# Patient Record
Sex: Male | Born: 1935 | Race: White | Hispanic: No | Marital: Married | Smoking: Former smoker
Health system: Southern US, Community
[De-identification: ages and names within clinical notes are randomized; demographics above are authoritative.]

## PROBLEM LIST (undated history)

## (undated) DIAGNOSIS — T4145XA Adverse effect of unspecified anesthetic, initial encounter: Secondary | ICD-10-CM

## (undated) DIAGNOSIS — H9190 Unspecified hearing loss, unspecified ear: Secondary | ICD-10-CM

## (undated) DIAGNOSIS — Z9289 Personal history of other medical treatment: Secondary | ICD-10-CM

## (undated) DIAGNOSIS — I209 Angina pectoris, unspecified: Secondary | ICD-10-CM

## (undated) DIAGNOSIS — E7800 Pure hypercholesterolemia, unspecified: Secondary | ICD-10-CM

## (undated) DIAGNOSIS — G8929 Other chronic pain: Secondary | ICD-10-CM

## (undated) DIAGNOSIS — I2510 Atherosclerotic heart disease of native coronary artery without angina pectoris: Secondary | ICD-10-CM

## (undated) DIAGNOSIS — M545 Low back pain, unspecified: Secondary | ICD-10-CM

## (undated) DIAGNOSIS — T8859XA Other complications of anesthesia, initial encounter: Secondary | ICD-10-CM

## (undated) DIAGNOSIS — I219 Acute myocardial infarction, unspecified: Secondary | ICD-10-CM

## (undated) DIAGNOSIS — E119 Type 2 diabetes mellitus without complications: Secondary | ICD-10-CM

## (undated) DIAGNOSIS — M1990 Unspecified osteoarthritis, unspecified site: Secondary | ICD-10-CM

## (undated) DIAGNOSIS — Z87442 Personal history of urinary calculi: Secondary | ICD-10-CM

## (undated) DIAGNOSIS — I639 Cerebral infarction, unspecified: Secondary | ICD-10-CM

## (undated) DIAGNOSIS — I10 Essential (primary) hypertension: Secondary | ICD-10-CM

## (undated) HISTORY — PX: NASAL SINUS SURGERY: SHX719

## (undated) HISTORY — PX: TYMPANOSTOMY TUBE PLACEMENT: SHX32

## (undated) HISTORY — PX: NASAL FRACTURE SURGERY: SHX718

## (undated) HISTORY — PX: CATARACT EXTRACTION W/ INTRAOCULAR LENS  IMPLANT, BILATERAL: SHX1307

## (undated) HISTORY — PX: COLONOSCOPY: SHX174

## (undated) HISTORY — PX: CYSTOSCOPY W/ STONE MANIPULATION: SHX1427

## (undated) HISTORY — PX: BACK SURGERY: SHX140

## (undated) HISTORY — PX: JOINT REPLACEMENT: SHX530

## (undated) HISTORY — PX: CORONARY ANGIOPLASTY WITH STENT PLACEMENT: SHX49

## (undated) HISTORY — PX: SHOULDER OPEN ROTATOR CUFF REPAIR: SHX2407

## (undated) HISTORY — PX: CARDIAC CATHETERIZATION: SHX172

---

## 1999-07-28 ENCOUNTER — Ambulatory Visit (HOSPITAL_COMMUNITY): Admission: RE | Admit: 1999-07-28 | Discharge: 1999-07-29 | Payer: Self-pay | Admitting: Cardiology

## 1999-08-29 DIAGNOSIS — Z9289 Personal history of other medical treatment: Secondary | ICD-10-CM

## 1999-08-29 DIAGNOSIS — I219 Acute myocardial infarction, unspecified: Secondary | ICD-10-CM

## 1999-08-29 HISTORY — DX: Acute myocardial infarction, unspecified: I21.9

## 1999-08-29 HISTORY — DX: Personal history of other medical treatment: Z92.89

## 1999-08-29 HISTORY — PX: CORONARY ARTERY BYPASS GRAFT: SHX141

## 1999-11-15 ENCOUNTER — Ambulatory Visit (HOSPITAL_COMMUNITY): Admission: RE | Admit: 1999-11-15 | Discharge: 1999-11-16 | Payer: Self-pay | Admitting: Cardiology

## 2000-01-08 ENCOUNTER — Inpatient Hospital Stay (HOSPITAL_COMMUNITY): Admission: EM | Admit: 2000-01-08 | Discharge: 2000-01-20 | Payer: Self-pay | Admitting: Emergency Medicine

## 2000-01-08 ENCOUNTER — Encounter: Payer: Self-pay | Admitting: Emergency Medicine

## 2000-01-09 ENCOUNTER — Encounter: Payer: Self-pay | Admitting: Surgery

## 2000-01-10 ENCOUNTER — Encounter: Payer: Self-pay | Admitting: Surgery

## 2000-01-11 ENCOUNTER — Encounter: Payer: Self-pay | Admitting: Surgery

## 2000-01-12 ENCOUNTER — Encounter: Payer: Self-pay | Admitting: Surgery

## 2000-01-12 ENCOUNTER — Encounter: Payer: Self-pay | Admitting: Cardiovascular Disease

## 2000-01-13 ENCOUNTER — Encounter: Payer: Self-pay | Admitting: Surgery

## 2000-01-14 ENCOUNTER — Encounter: Payer: Self-pay | Admitting: Surgery

## 2000-01-15 ENCOUNTER — Encounter: Payer: Self-pay | Admitting: Surgery

## 2000-01-16 ENCOUNTER — Encounter: Payer: Self-pay | Admitting: Thoracic Surgery (Cardiothoracic Vascular Surgery)

## 2000-01-17 ENCOUNTER — Encounter: Payer: Self-pay | Admitting: Surgery

## 2008-04-02 ENCOUNTER — Emergency Department (HOSPITAL_COMMUNITY): Admission: EM | Admit: 2008-04-02 | Discharge: 2008-04-03 | Payer: Self-pay | Admitting: Emergency Medicine

## 2008-09-10 ENCOUNTER — Encounter: Admission: RE | Admit: 2008-09-10 | Discharge: 2008-09-10 | Payer: Self-pay | Admitting: Specialist

## 2010-04-11 ENCOUNTER — Encounter: Admission: RE | Admit: 2010-04-11 | Discharge: 2010-04-11 | Payer: Self-pay | Admitting: Specialist

## 2010-04-25 ENCOUNTER — Ambulatory Visit (HOSPITAL_COMMUNITY): Admission: RE | Admit: 2010-04-25 | Discharge: 2010-04-25 | Payer: Self-pay | Admitting: Specialist

## 2010-08-23 ENCOUNTER — Inpatient Hospital Stay (HOSPITAL_COMMUNITY)
Admission: RE | Admit: 2010-08-23 | Discharge: 2010-08-27 | Disposition: A | Discharge status: Still In Hospital | Payer: Self-pay | Source: Home / Self Care | Attending: Specialist | Admitting: Specialist

## 2010-08-28 HISTORY — PX: TOTAL KNEE ARTHROPLASTY: SHX125

## 2010-09-18 ENCOUNTER — Encounter: Payer: Self-pay | Admitting: Specialist

## 2010-10-06 NOTE — Discharge Summary (Signed)
Jeffrey Nelson, Jeffrey Nelson               ACCOUNT NO.:  [id number]  MEDICAL RECORD NO.:  [id number]          PATIENT TYPE:  INP  LOCATION:  5031                         FACILITY:  [facility]  PHYSICIAN:  Kerrin Champagne, M.D.   DATE OF BIRTH:  1934/11/13  DATE OF ADMISSION:  08/23/2010 DATE OF DISCHARGE:  08/27/2010                              DISCHARGE SUMMARY   ADMISSION DIAGNOSES: 1. 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 2. Hypertension. 3. Gastroesophageal reflux disease. 4. Coronary artery disease status post coronary artery bypass graft. 5. History of postop atrial fibrillation. 6. Claustrophobia.  DISCHARGE DIAGNOSES: 1. 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 2. Hypertension. 3. Gastroesophageal reflux disease. 4. Coronary artery disease status post coronary artery bypass graft. 5. History of postop atrial fibrillation. 6. Claustrophobia. 7. Urinary tract infection. 8. Acute blood loss anemia. 9. Chest pain, resolved at discharge, noncardiogenic.  PROCEDURE:  On August 23, 2010, the patient underwent right total knee arthroplasty, computer assisted using DePuy components.  This was performed by Dr. Otelia Sergeant, assisted by Maud Deed [facility] under general anesthesia.  CONSULTATIONS:  [facility].  BRIEF HISTORY:  The patient is a 76 year old male who has had a couple years of progressive knee pain.  He was treated initially with anti- inflammatory medications as well as multiple intra-articular steroid injections as well as viscous supplementation and physical therapy.  The patient had previous right knee arthroscopy.  He got some relief initially, but has progressed to severe pain interfering with his activities of daily living.  He has failed conservative treatment and was admitted for the right total knee arthroplasty.  BRIEF HOSPITAL COURSE:  The patient tolerated the  procedure under general anesthesia without complications.  Postoperatively, he was placed on Coumadin for DVT prophylaxis.  Adjustments in Coumadin dose made according to daily pro-times.  The patient had difficulty with urinary frequency and urgency once his catheter was discontinued. Urinalysis was obtained and eventually, he was noted to have urinary tract infection treated with Augmentin.  He was started on physical therapy for range of motion, both actively and passively.  The CPM was utilized.  The patient was instructed in ambulation and gait training and was weightbearing as tolerated on the operative extremity.  He advanced well with his activity level.  Dressing change was done daily. His wound was healing without drainage or signs of infection.  The patient's Hemovac drain had been discontinued on the first postoperative day.  As the patient had allergy to MORPHINE, Dilaudid was used for pain control and the patient developed severe confusion.  He was then weaned to p.o. Percocet and his pain was controlled well.  The patient had episode of chest pain on August 25, 2010, at approximately 3 o'clock. He had difficulty taking a deep breath.  EKG was without changes from his preoperative EKG.  A consult was called and the patient was seen by the [facility].  V/Q scan was ordered to rule out pulmonary embolism.  Cardiac enzyme levels were drawn and were shown to be within normal  limits.  The patient's symptoms subsided with the use of nitroglycerin, but also with the use of simethicone.  Symptoms did not return throughout the remainder of the hospital stay.  Although, the V/Q scan was ordered, I am unable to find documentation in the chart of the test or the results of the test.  He did use utilize oxygen with the onset of this chest pain and a chest x-ray was performed showing no acute changes.  He was weaned off of the oxygen prior to discharge home without any  difficulties.  On August 26, 2010, the Hospitalist's note indicated resolution of the chest pain, most likely noncardiogenic cause.  He was medically stable and the medical team signed off.  On December 31, he was ambulating in the hallway.  He was afebrile, vital signs were stable.  He was comfortable with oral analgesics.  He was able to be discharged to his home with arrangements made for home health physical therapy, durable medical equipment, and Coumadin management.  PERTINENT LABORATORY VALUES:  Admission CBC with hemoglobin 14.6, hematocrit 42.1.  At discharge, hemoglobin 9.8, hematocrit 29.9.  INR at discharge 1.87.  Chemistry studies on admission were within normal limits and remained so during the hospital stay.  Cardiac enzymes were within normal limits.  The urinalysis on December 29 with 11-20 wbc's per high-power field and 3-6 rbc's per high-power field.  Preoperative screening for Staphylococcus was negative for MRSA and Staphylococcus aureus.  PLAN:  He was discharged to his home, arrangement was made for home health physical therapy.  The patient was instructed to continue ambulating weightbearing as tolerated.  He will change his dressing as needed and keep his incision dry until August 28, 2010, at which time, he may shower.  To follow up with Dr. Otelia Sergeant in 2 weeks.  CPM will be utilized at home as well for range of motion.  He is instructed to have a low-sodium, heart-healthy diet.  MEDICATIONS AT DISCHARGE: 1. Amoxicillin for urinary tract infection 500 mg 1 p.o. b.i.d. 2. Coumadin per pharmacy protocol. 3. Robaxin 500 mg 1 every 6-8 hours as needed for spasm. 4. Percocet 5/325 one-two every 4-6 hours as needed for pain.  The patient will follow up 2 weeks from surgery.  All questions encouraged and answered. Stable on discharge.     Wende Neighbors, P.A.   ______________________________ Kerrin Champagne, M.D.    SMV/MEDQ  D:  09/22/2010  T:   09/23/2010  Job:  [id number]  Electronically Signed by Dorna Mai. on 09/28/2010 11:05:50 AM Electronically Signed by [name] M.D. on 10/06/2010 08:20:25 PM

## 2010-11-07 LAB — URINALYSIS, ROUTINE W REFLEX MICROSCOPIC
Bilirubin Urine: NEGATIVE
Glucose, UA: NEGATIVE mg/dL
Hgb urine dipstick: NEGATIVE
Ketones, ur: NEGATIVE mg/dL
Ketones, ur: NEGATIVE mg/dL
Nitrite: NEGATIVE
Protein, ur: NEGATIVE mg/dL
Specific Gravity, Urine: 1.008 (ref 1.005–1.030)
Urobilinogen, UA: 0.2 mg/dL (ref 0.0–1.0)
pH: 6 (ref 5.0–8.0)

## 2010-11-07 LAB — COMPREHENSIVE METABOLIC PANEL
ALT: 15 U/L (ref 0–53)
ALT: 19 U/L (ref 0–53)
AST: 24 U/L (ref 0–37)
Albumin: 2.9 g/dL — ABNORMAL LOW (ref 3.5–5.2)
Alkaline Phosphatase: 65 U/L (ref 39–117)
Alkaline Phosphatase: 85 U/L (ref 39–117)
CO2: 30 mEq/L (ref 19–32)
CO2: 34 mEq/L — ABNORMAL HIGH (ref 19–32)
Calcium: 9.8 mg/dL (ref 8.4–10.5)
Chloride: 96 mEq/L (ref 96–112)
GFR calc Af Amer: >60 mL/min (ref >60)
GFR calc non Af Amer: >60 mL/min (ref >60)
Glucose, Bld: 139 mg/dL — ABNORMAL HIGH (ref 70–99)
Potassium: 4.1 mEq/L (ref 3.5–5.1)
Potassium: 4.5 mEq/L (ref 3.5–5.1)
Sodium: 136 mEq/L (ref 135–145)
Sodium: 137 mEq/L (ref 135–145)
Total Bilirubin: 1.4 mg/dL — ABNORMAL HIGH (ref 0.3–1.2)

## 2010-11-07 LAB — CBC
HCT: 42.1 % (ref 39.0–52.0)
Hemoglobin: 14.6 g/dL (ref 13.0–17.0)
Hemoglobin: 9.8 g/dL — ABNORMAL LOW (ref 13.0–17.0)
MCHC: 34.7 g/dL (ref 30.0–36.0)
MCV: 94.4 fL (ref 78.0–100.0)
MCV: 96.5 fL (ref 78.0–100.0)
MCV: 97.1 fL (ref 78.0–100.0)
Platelets: 197 10*3/uL (ref 150–400)
Platelets: 199 10*3/uL (ref 150–400)
Platelets: 226 10*3/uL (ref 150–400)
RBC: 3.11 MIL/uL — ABNORMAL LOW (ref 4.22–5.81)
RBC: 3.11 MIL/uL — ABNORMAL LOW (ref 4.22–5.81)
RBC: 3.39 MIL/uL — ABNORMAL LOW (ref 4.22–5.81)
WBC: 14.3 10*3/uL — ABNORMAL HIGH (ref 4.0–10.5)
WBC: 15.6 10*3/uL — ABNORMAL HIGH (ref 4.0–10.5)
WBC: 17.6 10*3/uL — ABNORMAL HIGH (ref 4.0–10.5)

## 2010-11-07 LAB — BASIC METABOLIC PANEL
Chloride: 96 mEq/L (ref 96–112)
Creatinine, Ser: 0.94 mg/dL (ref 0.4–1.5)
GFR calc Af Amer: >60 mL/min (ref >60)
Potassium: 4.8 mEq/L (ref 3.5–5.1)

## 2010-11-07 LAB — ABO/RH: ABO/RH(D): O NEG

## 2010-11-07 LAB — DIFFERENTIAL
Basophils Absolute: 0 10*3/uL (ref 0.0–0.1)
Basophils Absolute: 0.1 10*3/uL (ref 0.0–0.1)
Basophils Relative: 1 % (ref 0–1)
Eosinophils Absolute: 0.1 10*3/uL (ref 0.0–0.7)
Eosinophils Absolute: 0.2 10*3/uL (ref 0.0–0.7)
Eosinophils Relative: 1 % (ref 0–5)
Lymphocytes Relative: 15 % (ref 12–46)
Monocytes Absolute: 2.4 10*3/uL — ABNORMAL HIGH (ref 0.1–1.0)
Neutrophils Relative %: 60 % (ref 43–77)

## 2010-11-07 LAB — CK TOTAL AND CKMB (NOT AT ARMC)
CK, MB: 3.3 ng/mL (ref 0.3–4.0)
Relative Index: 1.3 (ref 0.0–2.5)
Total CK: 256 U/L — ABNORMAL HIGH (ref 7–232)

## 2010-11-07 LAB — PROTIME-INR
INR: 0.9 (ref 0.00–1.49)
INR: 1.72 — ABNORMAL HIGH (ref 0.00–1.49)
Prothrombin Time: 12.4 seconds (ref 11.6–15.2)
Prothrombin Time: 20.3 seconds — ABNORMAL HIGH (ref 11.6–15.2)
Prothrombin Time: 21.7 seconds — ABNORMAL HIGH (ref 11.6–15.2)

## 2010-11-07 LAB — TYPE AND SCREEN: ABO/RH(D): O NEG

## 2010-11-07 LAB — URINE MICROSCOPIC-ADD ON

## 2010-11-07 LAB — CARDIAC PANEL(CRET KIN+CKTOT+MB+TROPI)
CK, MB: 2.4 ng/mL (ref 0.3–4.0)
Total CK: 186 U/L (ref 7–232)

## 2010-11-11 LAB — COMPREHENSIVE METABOLIC PANEL
ALT: 23 U/L (ref 0–53)
AST: 27 U/L (ref 0–37)
CO2: 30 mEq/L (ref 19–32)
Chloride: 103 mEq/L (ref 96–112)
Creatinine, Ser: 0.94 mg/dL (ref 0.4–1.5)
GFR calc Af Amer: >60 mL/min (ref >60)
GFR calc non Af Amer: >60 mL/min (ref >60)
Sodium: 138 mEq/L (ref 135–145)
Total Bilirubin: 1.6 mg/dL — ABNORMAL HIGH (ref 0.3–1.2)

## 2010-11-11 LAB — CBC
HCT: 39 % (ref 39.0–52.0)
Hemoglobin: 13.6 g/dL (ref 13.0–17.0)
MCH: 32.7 pg (ref 26.0–34.0)
RBC: 4.16 MIL/uL — ABNORMAL LOW (ref 4.22–5.81)

## 2010-11-11 LAB — DIFFERENTIAL
Basophils Absolute: 0.1 10*3/uL (ref 0.0–0.1)
Eosinophils Absolute: 0.1 10*3/uL (ref 0.0–0.7)
Eosinophils Relative: 2 % (ref 0–5)

## 2010-11-11 LAB — HEMOGLOBIN A1C: Hgb A1c MFr Bld: 6.8 % — ABNORMAL HIGH (ref <5.7)

## 2011-01-13 NOTE — Op Note (Signed)
[place]  Patient:    Jeffrey Nelson, Jeffrey Nelson                      MRN: [id number] Proc. Date: 01/09/00 Adm. Date:  [id number] Attending:  Virgina Evener CC:         Alleen Borne, M.D., CVTS office             Dr. Jonny Ruiz ______________             Cardiac Cath Lab, [facility]                           Operative Report  PREOPERATIVE DIAGNOSIS:  Three vessel coronary disease, status post failed angioplasty of the right ventricular branch with dissection of the right coronary artery.  POSTOPERATIVE DIAGNOSIS:  Three vessel coronary disease, status post failed angioplasty of the right ventricular branch with dissection of the right coronary artery.  SURGICAL PROCEDURE:  Emergency median sternotomy, extracorporeal circulation, coronary artery bypass graft surgery x 4 using left internal mammary artery graft to left anterior descending coronary artery, with a saphenous vein graft to the second marginal branch of the left circumflex coronary artery, a saphenous vein graft to the posterior descending branch of the right coronary artery, and a saphenous vein graft to the right ventricular branch of the right coronary artery.  SURGEON:  Alleen Borne, M.D.  ASSISTANT:  Sherrie George, P.A.-C.  ANESTHESIA:  General endotracheal.  CLINICAL HISTORY:  This patient is a 76 year old gentleman with a history of coronary artery disease with a history of PTCA and stenting of the large right ventricular branch, as well as, stenting of the proximal right coronary artery.  He has also had PTCA of the LAD and stenting of the left circumflex coronary artery in the past.  He began having recurrent substernal chest pain on Friday, which awoke him from sleep.  He continued to have episodes and came to the emergency room.  He ruled out for myocardial infarction.  He was taken to the catheterization laboratory today and had attempted angioplasty of the large  right ventricular branch.  This was complicated by dissection of the proximal right coronary artery with closure and reopening.  I was called to the catheterization laboratory to review the angiogram and help with the decision about further treatment.  The right ventricular branch was a medium to large size vessel that had a tight ostial stenosis.  The main right coronary artery was a large vessel that was now dissected extending down into the mid to distal portion of the vessel.  The posterior descending and posterolateral branch were small to medium-sized branches.  The left anterior descending coronary artery also had about 60-70% mid vessel stenosis.  The left circumflex had some narrowing just beyond the previously stented area that I would estimate at 50-60% stenosis.  Left ventricular function was well-preserved.  Since the patient was relatively young and otherwise in good medical condition, I felt his best treatment would be with coronary artery bypass graft surgery.  I discussed the operative procedure with he and his wife, including alternatives, benefits and risks, including bleeding, possible blood transfusion, infection, stroke, myocardial infarction, graft failure and death.  They understood and agreed to proceed.  OPERATIVE PROCEDURE:  The patient was taken to the operating room and placed on table in supine position.  After induction of general endotracheal anesthesia, Foley catheter was placed in  bladder using sterile technique. Then, the chest, abdomen and both lower extremities were prepped and draped in usual sterile manner.  The chest was entered through a median sternotomy incision.  The pericardium opened in the midline.  Examination of the heart showed good ventricular contractility.  The ascending aorta had no palpable plaques in it.  Then, the left internal mammary artery was harvested from the chest wall as a pedicle graft.  This was a large caliber vessel with  excellent blood flow through it.  At the same time, a segment of greater saphenous vein was harvested from the left lower leg and this vein was of medium size and good quality.  Then, the patient was heparinized and when an adequate activated clotting time was achieved, the distal ascending aorta was cannulated using a 22-French aortic cannula for arterial inflow.  Venous outflow was achieved using a large 2-stage venous cannula through the right atrial appendage.  An antegrade cardioplegia and vent cannula was inserted in the aortic root.  The patient was placed on cardiopulmonary bypass and distal coronaries identified.  The right ventricular branch was on the surface of the heart and suitable for grafting.  The right coronary artery was diffusely diseased extending down to the takeoff of the posterior descending branch.  The posterior descending branch itself was a small to medium-sized vessel, but graftable.  It was a very fragile, thin-wall vessel that had no disease in it. The posterolateral branch was the same.  The patient had two medium-sized marginal branches and the second one appeared larger and was chosen for grafting.  There was no distal disease in the marginal branches themselves. The LAD was a long, large caliber vessel that had some mid vessel disease, but the distal vessel had no disease in it.  Then the percutaneous wire and guide catheter were withdrawn from the patient. The aorta was cross-clamped and 500 cc of cold blood antegrade cardioplegia was administered in the aortic root with quick arrest of the heart.  Systemic hypothermia to 20 degrees Centigrade and topical hypothermia with iced saline was used.  A temperature probe was placed in the septum and insulating pad in the pericardium.  The first distal anastomosis was performed to the posterior descending coronary artery.  The internal diameter was about 1.6 mm.  The conduit used was a segment of greater  saphenous vein and the anastomosis performed in a end-to-side manner using continuous 7-0 Prolene suture.  Flow was measured  through the graft and was excellent.  The second distal anastomosis was performed to the right ventricular branch. The internal diameter was 1.6 mm.  The conduit used was a second segment of greater saphenous vein and the anastomosis performed in an end-to-side manner using continuous 7-0 Prolene suture.  Flow was measured through the graft and was excellent.  Then another dose of cardioplegia was given down the vein grafts and in the aortic root.  The third distal anastomosis was performed to the second marginal branch.  The internal diameter was 1.75 mm.  The conduit used was a third segment of greater saphenous vein and the anastomosis performed in an end-to-side manner using continuous 7-0 Prolene suture.  Flow was measured through the graft and was excellent.  The fourth distal anastomosis was performed to the mid to distal portion of the left anterior descending coronary artery.  The internal diameter was about 2.5 mm.  The conduit used was the left internal mammary artery and this was brought out through an opening  in the left pericardium anterior to the phrenic nerve.  It was anastomosed to the LAD in an end-to-side manner using continuous 8-0 Prolene suture.  The pedicle was tacked to epicardium with 6-0 Prolene sutures.  The patient was rewarmed to 37 degrees Centigrade.  The clamp was removed from the mammary pedicle.  There was rapid warming of ventricular septum and return of spontaneous ventricular fibrillation.  The cross-clamp was removed with a time of 63 minutes and the patient defibrillated into a sinus rhythm.  A partial occlusion clamp was placed on the aortic root and the three proximal vein graft anastomoses were performed in an end-to-side manner using continuous 6-0 Prolene suture.  The clamp was removed and the vein grafts deaired and  the clamps removed from them.  The proximal and distal anastomoses appeared hemostatic and the lying of the grafts satisfactory.  Graft markers were placed around the proximal anastomoses.  Two temporary right ventricular and right atrial pacing wires were placed and brought out through the skin.  When the patient had rewarmed to 37 degrees Centigrade, he was weaned from cardiopulmonary bypass on low-dose dopamine.  Total bypass time was 110 minutes.  Cardiac function appeared excellent with a cardiac output of 7 L/min.  Protamine was given and the venous and aortic cannulae were removed without difficulty.  The patient was given platelets since he was on ReoPro in the catheterization laboratory and an obvious coagulopathy.  He required one unit of packed red blood cells for a postpump hematocrit of 17 and also received two units of fresh frozen plasma due to coagulopathy.  This resulted in adequate hemostasis.  Three chest tubes were placed with a tube in the posterior pericardium, one in the left pleural space and one in the anterior mediastinum.  The pericardium was reapproximated over the heart.  The sternum was closed with #6 stainless steel wires.  The fascia was closed with a continuous #1 Vicryl suture.  Subcutaneous tissue was closed using continuous 2-0 Vicryl and the skin with 3-0 Vicryl subcuticular closure.  Lower extremity vein harvest site was closed layers in a similar manner.  The sponge, needle and instrument counts were correct according to scrub nurse. Dry sterile dressings were applied over the incisions around the chest tubes, which were hooked to Pleur-evac suction.  The patient remained hemodynamically stable and was transported to the SICU in guarded, but stable condition. DD:  01/09/00 TD:  01/10/00 Job: [id number]

## 2011-01-13 NOTE — Cardiovascular Report (Signed)
[place]  Patient:    Jeffrey Nelson, Jeffrey Nelson                      MRN: [id number] Proc. Date: 01/09/00 Adm. Date:  [id number] Attending:  Virgina Evener CC:         Aram Candela. Aleen Campi, M.D.             Alleen Borne, M.D.             Cardiac Catheterization Laboratory                        Cardiac Catheterization  PROCEDURES: 1. Left heart catheterization. 2. Coronary cineangiography. 3. Left ventricular cineangiography. 4. Attempted angioplasty of the proximal right coronary artery, right    ventricular branch.  INDICATIONS FOR PROCEDURE:  This 76 year old male has a history of coronary artery disease and is status post multiple coronary artery angioplasties in his circumflex, mid LAD and right coronary artery.  His most recent angioplasty was on November 15, 1999, at which time he had angioplasty with stent placement in his proximal right coronary artery and large first right ventricular branch.  The right ventricular branch had a stent placement because of its dissection and instability and unable to keep it open without stenting.  He now returns to the hospital after several episodes of very severe anterior chest pain, which was associated with nausea, sweating and each time relieved after multiple nitroglycerin.  In the hospital his enzymes were negative and his ECG was unchanged.  Because of the past history of very reliable chest pain indicating severe stenosis in his coronary arteries, we scheduled him for repeat catheterization and possible angioplasty.  DESCRIPTION OF PROCEDURE:  After signing an informed consent, the patient was brought to the cardiac catheterization lab where his right groin was prepped and draped in a sterile fashion.  The right groin was then anesthetized locally with 1% lidocaine.  A 6 French introducer sheath was inserted percutaneously into the right femoral artery.  A 6 French #4 Judkins coronary catheters  were used to make injections into the coronary arteries.  A 6 French pigtail catheter was used to measure pressures in the left ventricle and aorta and to make a mid stream injection into the left ventricle.  After noting restenosis in the large right ventricular stented site at its ostium, we discussed this finding with the patient and felt that this was the cause of his most recent admission and severe chest pain, and after discussing this finding with the patient we elected to proceed with an angioplasty procedure of the right ventricular branch.  We first selected a 6 [place] JR4 guide catheter along with a Hi-Torque Floppy guide wire and advanced this system into the ascending aorta.  The tip was engaged in the ostium of the right coronary artery and the guidewire was advanced into the proximal right coronary artery and after moderate difficulty it was advanced into the right ventricular branch through the critical stenotic lesion.  We then selected a 3.0 x 10 mm balloon catheter and after proper preparation this was inserted over the guidewire and advanced into the right coronary artery.  Multiple attempts at passing the balloon inside the lesion were unsuccessful.  We then changed the guide catheter to a hockey-stick guide and again we were able to pass the guidewire but unable to pass a low profile balloon over the wire and into the  lesion.  After multiple attempts and changing the balloon catheter to a 2.0 low profile maverick balloon, we still were unable to pass the guidewire into the lesion in the right ventricular branch.  After discussing our difficulty with the patient and strongly feeling that this was the cause of his chest pain on admission, we then elected to use an Amplatz guide catheter A2, which was advanced over Seldinger wire to the root of the aorta.  We were able to engage the tip in the ostium of the right coronary artery and selected a Hi-Torque Floppy guide wire,  which was attempted to pass into the right ventricular branch.  We had very good backup with the Amplatz catheter, however, it advanced into the right coronary artery against the proximal stent and the tip caused a dissection to then extend throughout the proximal segment and into the middle segment where a prior stent is located.  The right coronary artery was then noted to be totally occluded and without antegrade flow and he developed severe chest pain.  We then removed the Amplatz balloon catheter and reinserted the hockey-stick guide catheter along with a Hi-Torque Floppy guide wire, and after engaging the ostium of the right coronary artery with the hockey-stick guide catheter we were able to then pass the guidewire into the right coronary artery and after several attempts we were able to pass it through the proper channel through the totally occluded and dissected area in the proximal right coronary artery and it was passed into the distal segment freely and without hindrance obtaining the proper channel though the dissection.  We inserted the 3.0 balloon catheter over the guidewire and after moderate severe difficulty we were able to pass this balloon catheter into the proximal segment and did multiple inflations throughout the dissected area and obtained a good reopening.  The dissection persisted but after noting reestablishment of good antegrade flow and marked relief of his pain, we felt that further intervention in this severely dissected right coronary artery which was technically a very difficult instrument and to pass a low profile balloon catheter into this area would make it almost impossible to do stenting in this area.  We called [facility] and Dr. Laneta Simmers came immediately and after showing him the cine and discussing the clinical situation, he agreed that bypass graft surgery would be the best alternative at this point.  We discussed this with the patient and after recommending  to the patient that he go for bypass graft surgery, he agreed, and he was then transferred to the OR  with the wire still across the lesion in the right coronary artery.  Final cine into the right coronary artery showed patency with good antegrade flow in the main right coronary artery and the large right ventricular branch was essentially unchanged from the pre-angioplasty cine.  There was slow antegrade flow.  After suturing the right femoral artery sheath and guide catheter in place, the patient was transported to the operating room for surgery.  HEMODYNAMIC DATA:  Left ventricular pressure 134/0-11, aortic pressure 137/76 with a mean of 99.  Left ventricular ejection fraction was estimated at 60-70%.  CORONARY CINE ANGIOGRAPHY:  Left coronary artery:  The ostium and left main appear normal.  Left anterior descending:  There is diffuse plaque throughout the proximal and middle segment with a focal narrowing of 50-60% in the middle segment.  The prior angioplasty site in the middle segment appears normal without significant restenosis.  The distal LAD appears normal.  There is a focal 70% stenosis in the mid to distal segment.  Circumflex coronary artery:  The proximal circumflex appears normal.  The mid circumflex has diffuse plaque which extends to the large distal bifurcation. There is a stented area in this middle segment which has not had significant restenosis.  However, just distal to the stent there is a segmental narrowing of 60-70% between the stent and the distal bifurcation of obtuse marginal branches.  Right coronary artery:  The ostium appears normal.  The proximal segment has a good appearance status post stenting.  The large right ventricular branch in the stented area now has a 99% ostial lesion and slow antegrade flow.  There is diffuse atherosclerotic plaque throughout the proximal segment with mid right coronary narrowing of 20-30%.  There is a stented area  in the middle segment just before the acute angle which now appears essentially normal without significant restenosis.  The distal segment has moderate irregularities with diffuse plaque and one area of 30-40% stenosis just before the takeoff of the posterior descending.  LEFT VENTRICULAR CINEANGIOGRAM:  The left ventricular chamber size and contractility appears normal.  All segments have very normal contractility and the ejection fraction was estimated at between 60-70%.  The mitral and aortic valves appear normal.  ANGIOPLASTY CINE:  Cine taken during the angioplasty procedure shows proper positioning of the guidewire initially in the right ventricular branch and approximation of the balloon catheter before the lesion, but not across the lesion.  Further cine showed dissection of the right coronary artery during the insertion of the Amplatz catheter with closure of the right coronary artery in the middle segment.  Further cine showed advancement of the guidewire through this totally occluded and dissected area and in the mid stream in the distal right coronary artery.  Further cine showed proper positioning of the 3-0 balloon catheter and balloon inflation in several areas.  Final injections in the right coronary artery showed reopening of the right coronary artery with dissection still in place and marked dissection throughout the proximal segment to the proximal portion of the mid right coronary artery stent.  The dissection did not extend into this stent. There was reestablishment of TIMI-3 antegrade flow.  FINAL DIAGNOSES: 1. Three-vessel coronary artery disease with severe critical restenosis in the    large right ventricular branch. 2. Moderate to severe stenosis, mid left anterior descending and distal    circumflex coronary artery. 3. Dissection of the proximal right coronary artery during attempted    angioplasty with subsequent reopening and stabilization for surgery. 4.  Normal left ventricular function. 5. Normal mitral and aortic valves.  DISPOSITION:  The patient was transferred to the operating room for urgent coronary artery bypass graft surgery, considering the instability of his proximal right coronary artery dissection.  Dr. Laneta Simmers will be performing the surgery.  The patient is fully aware of all circumstances and every thing that has occurred and his wife was fully informed also of the entire procedure and complications. DD:  01/09/00 TD:  01/10/00 Job: [id number]

## 2011-01-13 NOTE — Discharge Summary (Signed)
. [facility]  Patient:    HANCE, CASPERS                      MRN: [id number] Adm. Date:  [id number] Disc. Date: [id number] Attending:  Silvestre Mesi Dictator:   Donzetta Matters, P.A.-C.                           Discharge Summary  DATE OF BIRTH:  Dec 30, 1934  PRINCIPAL DIAGNOSES ON DISCHARGE: 1. Coronary artery disease status post stenting. 2. Elevated lipids.  CONSULTS:  ______ ______ study.  CONDITION ON DISCHARGE:  Stable.  COMPLICATIONS:  None.  HISTORY OF PRESENT ILLNESS:  This is a 76 year old male that is brought in electively for heart catheterization on November 15, 1999.  His initial labs were removed which did show a hemoglobin of 13.6, hematocrit 39.0.  White count was 8500, platelet count 231.  His PTT was 28.9, pro time was 12.5, INR 1.06. Follow-up comprehensive metabolic panel did show a sodium at 132, potassium 4.2, BUN 17, creatinine 0.5.  His glucose was 121 on a nonfasting specimen. Otherwise, all other labs were normal.  He was then scheduled for elective heart catheterization with results showing restenosis in the right coronary artery RV  branch.  He underwent successful PTCA and stenting of the right coronary artery  proximal and the RV branch.  He has a normal left ventricular function.  He has  good appearances of prior angioplasty sites.  He was then kept overnight and monitored and remained stable.  He has been ambulated in the halls.  Monitors remain stable with sinus rhythm.  Vitals this a.m. shows blood pressure at 120/50, pulse 72, respirations 18, pulse oximetry 94% on room air.  Chest is clear. Heart has regular rate and rhythm.  He has good pulses and ambulatory without any difficulty with chest pain.  He is ready for discharge to home with the following medications:  Tenormin 50 mg two tablets daily, coated aspirin 325 daily, Plavix one daily.  He is given samples as well as prescription, vitamin  E 800 units daily, vitamin C daily, omega-3 fish oil 3 g daily, folic acid 1 g daily, and Zocor 40 mg daily.  ACTIVITY:  As tolerated.  DIET:  Low cholesterol.  He is to watch the groin for any signs of bleeding.  SPECIAL INSTRUCTIONS:  He has been discussed with ______ that is heading the ______ study with [facility].  It does sound like he could be a good candidate.  He is to have a lipid panel, nonnfasting, before discharge.  He is o follow up with Dr. Aleen Campi in two weeks. DD:  11/16/99 TD:  11/16/99 Job: [id number]

## 2011-01-13 NOTE — Discharge Summary (Signed)
[facility]  Patient:    Jeffrey Nelson, Jeffrey Nelson                      MRN: [id number] Adm. Date:  [id number] Disc. Date: 01/20/00 Attending:  Cleatrice Burke Dictator:   Jeffrey Nelson, P.A. CC:         Jeffrey Nelson, M.D., [facility], [address]             [address], [place], [place] [address]             Jeffrey Nelson. Jeffrey Nelson, M.D.                  Referring Physician Discharge [place]  DATE OF BIRTH:  Aug 25, 2035  ADMISSION DIAGNOSES: 1. Unstable angina, rule out myocardial infarction with a history of prior    percutaneous transluminal coronary angioplasty and stent placement, the    last being right coronary artery on November 15, 1999. 2. Hypertension. 3. Hyperlipidemia.  DISCHARGE DIAGNOSES: 1. Severe three-vessel coronary artery disease with acute dissection of the    right coronary artery. 2. Hypertension. 3. Hyperlipidemia. 4. Postoperative glucose intolerance. 5. Gastroesophageal reflux disease/history of hiatal hernia. 6. Postoperative anemia. 7. Postoperative atrial fibrillation.  PROCEDURES: 1. Cardiac catheterization on Jan 09, 2000.  Findings included a 30-40% left    anterior descending, 20-30% stenosis of the circumflex, 80-90% right    coronary artery stenosis with acute stenosis, ejection fraction estimated    approximately 60% on Jan 09, 2000, by Jeffrey Nelson. Tysinger, M.D. 2. Emergent coronary artery bypass grafting x 4, left internal mammary artery    to left anterior descending, saphenous vein graft to posterior descending,    saphenous vein graft to diagonal, and saphenous vein graft to acute    marginal, on Jan 09, 2000, by Alleen Nelson, M.D.  HISTORY OF PRESENT ILLNESS:  The patient is a 76 year old white male, a medical patient of Jeffrey Nelson, M.D.  He has a primary care physician in [place], [place], Jeffrey Nelson, M.D.  His cardiologist is Jeffrey Nelson. Jeffrey Nelson, M.D.  The patient has a history of  atherosclerotic cardiovascular disease.  His last catheterization was on November 15, 1999, at which time he underwent PTCA and stenting of the right coronary artery.  This was felt to be successful.  Additional findings included diffuse 30-40% stenosis of the circumflex, 20-30% and 40% stenoses of the LAD, and 80-90% stenosis of the RCA.  He has been stable up until yesterday when he developed recurrent chest pain.  This started around 2 p.m. and he took a nitroglycerin.  He developed relief initially.  He called Dr. Aleen Nelson and was brought to the emergency room with progressive worsening of his angina.  PAST MEDICAL HISTORY:  Nephrolithiasis.  Multiple cardiac catheterizations for his cardiac disease.  Hyperlipidemia.  Hypertension.  Remote history of peptic ulcer disease.  MEDICATIONS ON ADMISSION: 1. Zocor 40 mg q.h.s. 2. Atenolol 50 mg two q.d. 3. Ecotrin 325 mg q.d. 4. Folic acid one q.d. 5. Omega 3 fish oil. 6. Vitamin C. 7. Vitamin D. 8. Sublingual nitroglycerin.  ALLERGIES:  None known.  For further history and physical, please see the dictated note from Dr. Star Age office.  HOSPITAL COURSE:  The patient was admitted.  He was stabilized with IV heparin.  He was subsequently taken back to the cardiac catheterization lab in the a.m. on Jan 09, 2000.  During the interim, CPK-MBs were  negative. Catheterization results showed three-vessel coronary artery disease with severe right stenosis of the RV branch within the stent.  The other angioplasty sites appeared to look good.  LV function was normal.  During the procedure, he developed a dissection of the right coronary artery.  He was seen in consultation by Alleen Nelson, M.D., and it was his opinion along with Dr. Aleen Nelson that the patient should undergo emergent coronary artery bypass grafting.  He was subsequently taken to the operating room and underwent coronary artery bypass grafting x 4 with LIMA to the LAD,  saphenous vein graft to the posterior descending, saphenous vein graft to diagonal, and saphenous vein graft to a large RV branch of the acute marginal. The patient tolerated the procedure well and returned to the intensive care unit in satisfactory condition.  He remained stable overnight.  On the first postoperative morning, the patient was hemodynamically stable.  His MTs were removed.  Diuresis was initiated.  He was followed throughout his hospital course by Dr. Aleen Nelson.  On the second postoperative day, the patient continued to do well status post emergent CABG.  Postoperative renal dysfunction was present with creatinine going up to 2.8, although the patient continued to have an adequate urine output.  The patient was maintained on renal dose dopamine.  The patient also had postoperative anemia with a hemoglobin down to 7.9 and a hematocrit of 22.4.  The patient was transfused one unit of packed cells and was mobilized further within the ICU.  By Jan 12, 2000, the third postoperative day, the hemoglobin was 8.2, the hematocrit was 22, and the creatinine was stable at 2.3.  The chest x-ray showed some haziness at the left hemithorax which was thought to be atelectasis.  The patient also developed postoperative atrial fibrillation and was digitalized. The patient received a unit of packed cells.  He also developed some postoperative delirium which was thought to be secondary to sedation.  That afternoon, a left chest tube was inserted and a liter of dark, nonclotting, bloody fluid was drained.  Breath sounds on the left were much improved.  The post chest tube chest x-ray was stable.  On Jan 13, 2000, the fourth postoperative day, chest tube drainage was down to 180 cc over the last eight hours, dark, bloody, nonclotting fluid.  The chest x-ray showed improved aeration.  Postoperative delirium was somewhat improved.  It was felt that he had some postoperative bleeding with clot in the  left hemithorax.  This appeared to be well drained with the chest tube.  The plans were to continue  the patient with monitoring of his chest tube output along with his hematocrit.  It was Dr. Garen Grams opinion that the patient was not actively bleeding.  Renal dysfunction continued to improve.  On Jan 14, 2000, the patient continued to show progress, although the hemoglobin and hematocrit were slowly drifting down again.  By Jan 15, 2000, the patient was feeling better and he was afebrile.  Telemetry showed sinus rhythm with rates of 100. He was having no further atrial fibrillation.  He had been placed on a Cardizem drip after digitalization for this and this was weaned.  His hemoglobin was 8.7 with a hematocrit of 25.6.  The BUN was 54.  The creatinine was down to 1.5, which was stable.  Postoperatively, the patient also had markedly elevated glucoses.  This was initially treated with a sliding scale insulin drip.  Once the patient recovered, CBGs returned to normal.  He  underwent diet education with warnings about the probability of future diabetes, but at this point blood glucoses have returned to normal on a no concentrated sweets diet.  CBGs yesterday only recorded once at 106.  The patient was ultimately transferred to the floor 2000.  He was started on phase 1 cardiac rehabilitation with progressive ambulation.  He has had no serious setbacks since his transfer to the floor.  His creatinine has returned to 1.3 with a BUN of 29, a sodium of 132, a potassium of 4.3, a chloride of 92, and a CO2 of 34.  Telemetry showed sinus rhythm with a variable rate, but a sinus rate nonetheless.  Chest x-ray showed continued improvement.  The only problem by Jan 18, 2000, was his white count, which was 19,300.  He continued to make progress throughout Jan 19, 2000, and it was Dr. Garen Grams opinion that if he continued to do well, he could go home in the a.m. on Jan 20, 2000.  We plan to recheck his  CBC and make sure his white count has come down and a BMET to monitor his creatinine.  DISPOSITION:  At this point, we plan discharge in the a.m. of Jan 20, 2000.  DISCHARGE MEDICATIONS: 1. Keflex 500 mg p.o. q.8h. x 5 days. 2. Digoxin 0.125 mg q.d. 3. Lopressor 25 mg p.o. q.12h. 4. Darvocet-N 100 one to two p.o. q.4h. p.r.n. 5. Niferex 150 mg p.o. q.d. 6. Coated aspirin 325 mg one q.d. 7. Multivitamins one q.d.  ACTIVITY:  Light to moderate.  No lifting over 10 pounds.  No driving.  No strenuous activity.  DIET:  He will maintain a low-fat, no concentrated sweet diet.  WOUND CARE:  He is to clean his wounds with plain soap and water.  FOLLOW-UP:  He is to see Jeffrey Nelson. Tysinger, M.D., in two weeks with a chest x-ray in his office.  He will return to see Alleen Nelson, M.D., on Tuesday, November 24, 1999, and then again on Tuesday, February 14, 2000, at 9 a.m.  CONDITION ON DISCHARGE:  Improving.  LABORATORY DATA:  Currently his discharge labs are as follows:  Sodium 133, potassium 3.8, chloride 95, CO2 33, glucose 121, BUN 24, creatinine 1.1, calcium 8.4.  The hemoglobin A1C is 5.4 with normal range being 4.6-6.5.  The white count as of Jan 18, 2000, was 19,300, the hemoglobin was 8.9, the hematocrit was 26.6, and platelets were 460,000. DD:  01/19/00 TD:  01/19/00 Job: [id number]

## 2011-01-13 NOTE — Cardiovascular Report (Signed)
[address]  Patient:    Jeffrey Nelson, Jeffrey Nelson                      MRN: [id number] Proc. Date: 11/15/99 Adm. Date:  [id number] Disc. Date: [id number] Attending:  Silvestre Mesi CC:         Aram Candela. Aleen Campi, M.D.             Cardiac Catheterization Laboratory                        Cardiac Catheterization  PROCEDURES: 1. Left heart catheterization. 2. Coronary cineangiography. 3. Left ventricular cineangiography. 4. Percutaneous transluminal coronary angioplasty and stent placement of the    right ventricular branch. 5. Stent placement of the proximal right coronary artery. 6. Perclose of the right femoral artery.  INDICATIONS FOR PROCEDURE:  This 76 year old male has a history of coronary artery disease and has had multiple procedures in the past.  He first had angioplasty f his distal circumflex coronary artery in 1990.  He did well until 1997 at which  time he had angioplasty of his right coronary artery with angioplasty of his right ventricular branch and mid right coronary artery.  The next procedure was in November of 2000 at which time he had repeat angioplasty of his mid right coronary artery and stent placement.  He recently had recurrence of his unstable angina nd was scheduled for repeat study and possible angioplasty.  DESCRIPTION OF PROCEDURE:  After signing an informed consent, the patient was premedicated with 50 mg of Benadryl intravenously and brought to the cardiac catheterization lab.  His right groin was prepped and draped in a sterile fashion and anesthetized locally with 1% lidocaine.  A #6 French introducer sheath was inserted percutaneously into the right femoral artery.  The 6 [place] #4 Judkins  coronary catheters were used to make injections into the coronary arteries.  A  French pigtail catheter was used to measure pressures in the left ventricle and  aorta and to make mid stream injection into the left  ventricle.  After noting restenosis in his right ventricular branch from angioplasty in September of 1997 in conjunction with very good long-term appearance of his mid right coronary artery stent and distal circumflex angioplasty site, we discussed our findings ith the patient and elected to proceed with angioplasty of this right ventricular branch.  We then selected a 6 [place] #4 Judkins right coronary guide catheter which was advanced into the root of the aorta.  We then selected a short Hi-Torque Floppy guide wire which was advanced through the guide catheter and into the right coronary artery.  We were able to advance the guide catheter into the right ventricular branch and positioned it into the distal segment of this branch. We then selected a 2.5 x 15 mm balloon catheter which was advanced over the guidewire and positioned within the ostial lesion in the right ventricular branch. Several inflations were made with initially having a good result each time.  However, there was rebound restenosis within this lesion after each inflation.  We then chose  2.5 x 8 mm Tetra stent deployment system and after proper preparation this was inserted over the guidewire and advanced into the proximal right ventricular branch.  The stent was deployed with one inflation at 20 atmospheres for 34 seconds.  After this inflation an injection again in the right coronary artery showed an excellent angiographic result in the  right ventricular lesion. However, there was compromise of the right coronary artery just distal to this bifurcation and probably secondary to the stent placement in the right ventricular branch. He was given nitroglycerin 200 units intracoronary twice without relief of this stenotic lesion.  We then selected a 3.0 x 8 mm Tetra stent system along with a  firm extra support Hi-Torque Floppy guide wire and with this system we were able to advance the guidewire through the  lesion in the proximal right coronary artery nd also to advance this Tetra stent system within the lesion.  This second stent was then deployed with one inflation at 16 atmospheres for 38 seconds.  After this stent deployment the balloon catheter was removed and injection again in the right coronary artery showed an excellent angiographic result and in the proximal right coronary lesion and also continued good results intact the right ventricular branch lesion.  The patient tolerated the procedure well and no complications were noted. At the end of the procedure the catheter and sheath were removed from the right  femoral artery and hemostasis was easily obtained with a Perclose closure system.  MEDICATIONS GIVEN:  Versed 1 mg IV, morphine 1 mg IV, heparin 5900 units IV, ReoPro drip per pharmacy protocol.  HEMODYNAMIC DATA:  Right ventricular pressure 143/0-16, aortic pressure 139/77 ith a mean of 101.  Left ventricular ejection fraction was estimated at 60%. After confirming his stable condition and good appearance of his new stents in he right coronary artery, he was admitted to 6500 for further monitoring and continued ReoPro drip.  We will plan to discharge tomorrow.  CINE FINDINGS:  CORONARY CINE ANGIOGRAPHY:  Left coronary artery:  The ostium and left main appear normal.  Left anterior descending:  The LAD has several plaques throughout the proximal id and distal segment with varying degrees of severity of approximately 30-40%. There was no critical lesion throughout and there is very good antegrade flow.  This s essentially unchanged from his prior studies.  Circumflex coronary artery:  The proximal and middle segment also have plaque which causes a 20-30% stenosis in the middle segment and distally in the area of the prior angioplasty in 1990.  There now is a 40% stenosis, again which is essentially unchanged from the prior study.  This appears to be a good  long-term result from his primary angioplasty procedure.  Right coronary artery:  The ostium appears normal.  There was a 30-40% stenotic   plaque in the proximal segment near the takeoff of the right ventricular branch. The right ventricular branch is a very large branch which has an early takeoff nd has an 80-90% ostial lesion.  This is the site of prior angioplasty in 1997 and  represents restenosis.  The right coronary artery then has a segmental plaque throughout the middle ______ that is causing a 30-40% stenosis.  At the acute angle is the site of the most recent angioplasty in November of 2000 and now this segment appears normal with an excellent long-term results and normal appearance. The distal right coronary artery appears normal with very good antegrade flow.   LEFT VENTRICULAR CINEANGIOGRAM:  The left ventricular chamber size and contractility appears normal.  The left ventricular wall thickness appears normal. The overall left ventricular contractility is normal without segmental abnormality. The ejection fraction was estimated at least 60%.  The mitral and aortic valves  appear normal.  ANGIOPLASTY PROCEDURE:  Cine taken during the angioplasty procedure shows proper positioning of the guidewire  in the right ventricular branch initially with a good balloon form obtained with the first procedure with angioplasty and subsequent films showed restenosis after each inflation.  Cine taken after the stent deployment showed an excellent angiographic result with 0% residual lesion in the right ventricular branch ostial lesion.  There was no rebound following this placement.  After the stent placement in the right ventricular branch, we noted  stenosis within the proximal right coronary artery at the bifurcation of this right ventricular branch with a new 80% stenosis.  Final injections in the right coronary artery, status post insertion of the proximal right  coronary artery stent showed an excellent angiographic result with 0% residual within the proximal right coronary artery lesion.  FINAL DIAGNOSES: 1. Restenosis in the right coronary artery, right ventricular branch. 2. Successful percutaneous transluminal coronary angioplasty and stent    placement in the right ventricular branch. 3. Compromise of the proximal right coronary artery secondary to stent placement    in the right ventricular branch. 4. Successful stent placement in the proximal right coronary artery. 5. Normal left ventricular function. 6. Very good appearance of the prior angioplasty sites in the mid    right coronary artery and distal circumflex. 7. Successful Perclose of the right femoral artery.  DISPOSITION:  Will admit to 6500 for monitoring overnight and anticipate discharge tomorrow. DD:  11/15/99 TD:  11/16/99 Job: [id number]

## 2011-03-09 ENCOUNTER — Other Ambulatory Visit: Payer: Self-pay | Admitting: Specialist

## 2011-03-09 DIAGNOSIS — S46219A Strain of muscle, fascia and tendon of other parts of biceps, unspecified arm, initial encounter: Secondary | ICD-10-CM

## 2011-03-09 DIAGNOSIS — M25511 Pain in right shoulder: Secondary | ICD-10-CM

## 2011-03-15 ENCOUNTER — Ambulatory Visit
Admission: RE | Admit: 2011-03-15 | Discharge: 2011-03-15 | Disposition: A | Discharge status: Home / Self Care | Payer: Medicare Other | Source: Ambulatory Visit | Attending: Specialist | Admitting: Specialist

## 2011-03-15 DIAGNOSIS — S46219A Strain of muscle, fascia and tendon of other parts of biceps, unspecified arm, initial encounter: Secondary | ICD-10-CM

## 2011-03-15 DIAGNOSIS — M25511 Pain in right shoulder: Secondary | ICD-10-CM

## 2011-03-15 MED ORDER — IOHEXOL 180 MG/ML  SOLN
15.0000 mL | Freq: Once | INTRAMUSCULAR | Status: AC | PRN
  Administered 2011-03-15: 15 mL via INTRA_ARTICULAR

## 2011-05-26 LAB — POCT CARDIAC MARKERS
Troponin i, poc: <0.05
Troponin i, poc: <0.05

## 2011-05-26 LAB — CBC
Hemoglobin: 12.4 — ABNORMAL LOW
MCHC: 33.9
MCV: 96.9
RBC: 3.76 — ABNORMAL LOW

## 2011-05-26 LAB — POCT I-STAT, CHEM 8
Creatinine, Ser: 1.2
Glucose, Bld: 239 — ABNORMAL HIGH
Hemoglobin: 12.9 — ABNORMAL LOW
TCO2: 23

## 2011-08-31 ENCOUNTER — Other Ambulatory Visit: Payer: Self-pay | Admitting: Orthopaedic Surgery

## 2011-08-31 DIAGNOSIS — M25511 Pain in right shoulder: Secondary | ICD-10-CM

## 2011-09-06 ENCOUNTER — Ambulatory Visit
Admission: RE | Admit: 2011-09-06 | Discharge: 2011-09-06 | Disposition: A | Discharge status: Home / Self Care | Payer: Medicare Other | Source: Ambulatory Visit | Attending: Orthopaedic Surgery | Admitting: Orthopaedic Surgery

## 2011-09-06 DIAGNOSIS — M25511 Pain in right shoulder: Secondary | ICD-10-CM

## 2013-11-14 ENCOUNTER — Other Ambulatory Visit: Payer: Self-pay | Admitting: Specialist

## 2013-11-14 DIAGNOSIS — M549 Dorsalgia, unspecified: Secondary | ICD-10-CM

## 2013-11-19 ENCOUNTER — Ambulatory Visit
Admission: RE | Admit: 2013-11-19 | Discharge: 2013-11-19 | Disposition: A | Discharge status: Home / Self Care | Payer: Medicare Other | Source: Ambulatory Visit | Attending: Specialist | Admitting: Specialist

## 2013-11-19 DIAGNOSIS — M549 Dorsalgia, unspecified: Secondary | ICD-10-CM

## 2013-11-19 MED ORDER — GADOBENATE DIMEGLUMINE 529 MG/ML IV SOLN
20.0000 mL | Freq: Once | INTRAVENOUS | Status: AC | PRN
  Administered 2013-11-19: 20 mL via INTRAVENOUS

## 2014-01-06 ENCOUNTER — Other Ambulatory Visit (HOSPITAL_COMMUNITY): Payer: Self-pay | Admitting: Specialist

## 2014-01-08 ENCOUNTER — Encounter (HOSPITAL_COMMUNITY): Payer: Self-pay | Admitting: Pharmacy Technician

## 2014-01-09 ENCOUNTER — Encounter (HOSPITAL_COMMUNITY)
Admission: RE | Admit: 2014-01-09 | Discharge: 2014-01-09 | Disposition: A | Discharge status: Home / Self Care | Payer: Medicare Other | Source: Ambulatory Visit | Attending: Specialist | Admitting: Specialist

## 2014-01-09 ENCOUNTER — Encounter (HOSPITAL_COMMUNITY): Payer: Self-pay

## 2014-01-09 ENCOUNTER — Ambulatory Visit (HOSPITAL_COMMUNITY)
Admission: RE | Admit: 2014-01-09 | Discharge: 2014-01-09 | Disposition: A | Discharge status: Home / Self Care | Payer: Medicare Other | Source: Ambulatory Visit | Attending: Anesthesiology | Admitting: Anesthesiology

## 2014-01-09 DIAGNOSIS — Z96659 Presence of unspecified artificial knee joint: Secondary | ICD-10-CM | POA: Insufficient documentation

## 2014-01-09 DIAGNOSIS — I2510 Atherosclerotic heart disease of native coronary artery without angina pectoris: Secondary | ICD-10-CM | POA: Insufficient documentation

## 2014-01-09 DIAGNOSIS — Z951 Presence of aortocoronary bypass graft: Secondary | ICD-10-CM | POA: Insufficient documentation

## 2014-01-09 DIAGNOSIS — Z87891 Personal history of nicotine dependence: Secondary | ICD-10-CM | POA: Insufficient documentation

## 2014-01-09 DIAGNOSIS — I252 Old myocardial infarction: Secondary | ICD-10-CM | POA: Insufficient documentation

## 2014-01-09 DIAGNOSIS — I700 Atherosclerosis of aorta: Secondary | ICD-10-CM | POA: Insufficient documentation

## 2014-01-09 DIAGNOSIS — I10 Essential (primary) hypertension: Secondary | ICD-10-CM | POA: Insufficient documentation

## 2014-01-09 DIAGNOSIS — M47814 Spondylosis without myelopathy or radiculopathy, thoracic region: Secondary | ICD-10-CM | POA: Insufficient documentation

## 2014-01-09 DIAGNOSIS — Z01818 Encounter for other preprocedural examination: Secondary | ICD-10-CM | POA: Insufficient documentation

## 2014-01-09 DIAGNOSIS — F40298 Other specified phobia: Secondary | ICD-10-CM | POA: Insufficient documentation

## 2014-01-09 DIAGNOSIS — Z01812 Encounter for preprocedural laboratory examination: Secondary | ICD-10-CM | POA: Insufficient documentation

## 2014-01-09 DIAGNOSIS — E119 Type 2 diabetes mellitus without complications: Secondary | ICD-10-CM | POA: Insufficient documentation

## 2014-01-09 HISTORY — DX: Personal history of urinary calculi: Z87.442

## 2014-01-09 HISTORY — DX: Essential (primary) hypertension: I10

## 2014-01-09 HISTORY — DX: Adverse effect of unspecified anesthetic, initial encounter: T41.45XA

## 2014-01-09 HISTORY — DX: Other complications of anesthesia, initial encounter: T88.59XA

## 2014-01-09 HISTORY — DX: Acute myocardial infarction, unspecified: I21.9

## 2014-01-09 HISTORY — DX: Angina pectoris, unspecified: I20.9

## 2014-01-09 HISTORY — DX: Unspecified osteoarthritis, unspecified site: M19.90

## 2014-01-09 HISTORY — DX: Atherosclerotic heart disease of native coronary artery without angina pectoris: I25.10

## 2014-01-09 LAB — CBC
HEMATOCRIT: 39.9 % (ref 39.0–52.0)
Hemoglobin: 13.7 g/dL (ref 13.0–17.0)
MCH: 32.6 pg (ref 26.0–34.0)
MCHC: 34.3 g/dL (ref 30.0–36.0)
MCV: 95 fL (ref 78.0–100.0)
PLATELETS: 216 10*3/uL (ref 150–400)
RBC: 4.2 MIL/uL — ABNORMAL LOW (ref 4.22–5.81)
RDW: 13.6 % (ref 11.5–15.5)
WBC: 10.1 10*3/uL (ref 4.0–10.5)

## 2014-01-09 LAB — COMPREHENSIVE METABOLIC PANEL
ALBUMIN: 4.4 g/dL (ref 3.5–5.2)
ALK PHOS: 88 U/L (ref 39–117)
ALT: 18 U/L (ref 0–53)
AST: 27 U/L (ref 0–37)
BUN: 16 mg/dL (ref 6–23)
CHLORIDE: 103 meq/L (ref 96–112)
CO2: 27 mEq/L (ref 19–32)
Calcium: 9.9 mg/dL (ref 8.4–10.5)
Creatinine, Ser: 0.88 mg/dL (ref 0.50–1.35)
GFR calc Af Amer: >90 mL/min (ref >90)
GFR calc non Af Amer: 81 mL/min — ABNORMAL LOW (ref >90)
Glucose, Bld: 102 mg/dL — ABNORMAL HIGH (ref 70–99)
POTASSIUM: 4.5 meq/L (ref 3.7–5.3)
Sodium: 143 mEq/L (ref 137–147)
TOTAL PROTEIN: 7.6 g/dL (ref 6.0–8.3)
Total Bilirubin: 1.5 mg/dL — ABNORMAL HIGH (ref 0.3–1.2)

## 2014-01-09 LAB — PROTIME-INR
INR: 0.97 (ref 0.00–1.49)
Prothrombin Time: 12.7 seconds (ref 11.6–15.2)

## 2014-01-09 LAB — SURGICAL PCR SCREEN
MRSA, PCR: NEGATIVE
STAPHYLOCOCCUS AUREUS: NEGATIVE

## 2014-01-09 NOTE — Progress Notes (Addendum)
req'd notes ,ekg, echo, stress, cath from [facility] dr Karleen Hampshirebarry cheek  Draw type and screen day of surgery due to patients extreme claustrophobia

## 2014-01-09 NOTE — Pre-Procedure Instructions (Addendum)
Jeffrey Nelson  01/09/2014   Your procedure is scheduled on:  Friday, May 22.  [facility] at 10:30 AM.  Call this number if you have problems the morning of surgery: [phone]   Remember:   Do not eat food or drink liquids after midnight Thursday, May 21.   Take these medicines the morning of surgery with A SIP OF WATER: amLODipine (NORVASC), atenolol (TENORMIN).                                    Take if needed: cyclobenzaprine (FLEXERIL), oxyCODONE-acetaminophen (PERCOCET/ROXICET).          Take all meds as ordered until day of surgery except as instructed below or per dr                Stop tomorrow: taking Aspirin, Vitamins and Herbal Medications.including coq10, vit b,,fish oil          NO DIABETIC MEDS DAY OF SURGERY   Do not wear jewelry, make-up or nail polish.  Do not wear lotions, powders, or perfumes.              Men may shave face and neck.  Do not bring valuables to the hospital.              [facility] not responsible for any belongings or valuables.               Contacts, dentures or bridgework may not be worn into surgery.  Leave suitcase in the car. After surgery it may be brought to your room.  For patients admitted to the hospital, discharge time is determined by your treatment team.               Patients discharged the day of surgery will not be allowed to drive home.  Name and phone number of your driver:  Special Instructions: [place] - Preparing for Surgery  Before surgery, you can play an important role.  Because skin is not sterile, your skin needs to be as free of germs as possible.  You can reduce the number of germs on you skin by washing with CHG (chlorahexidine gluconate) soap before surgery.  CHG is an antiseptic cleaner which kills germs and bonds with the skin to continue killing germs even after washing.  Please DO NOT use if you have an allergy to CHG or antibacterial soaps.  If your skin becomes  reddened/irritated stop using the CHG and inform your nurse when you arrive at Short Stay.  Do not shave (including legs and underarms) for at least 48 hours prior to the first CHG shower.  You may shave your face.  Please follow these instructions carefully:   1.  Shower with CHG Soap the night before surgery and the morning of Surgery.  2.  If you choose to wash your hair, wash your hair first as usual with your normal shampoo.  3.  After you shampoo, rinse your hair and body thoroughly to remove the Shampoo.  4.  Use CHG as you would any other liquid soap.  You can apply chg directly  to the skin and wash gently with scrungie or a clean washcloth.  5.  Apply the CHG Soap to your body ONLY FROM THE NECK DOWN.  Do not use on open wounds or open sores.  Avoid contact with your eyes ears,  mouth and genitals (private parts).  Wash genitals (private parts)       with your normal soap.  6.  Wash thoroughly, paying special attention to the area where your surgery will be performed.  7.  Thoroughly rinse your body with warm water from the neck down.  8.  DO NOT shower/wash with your normal soap after using and rinsing off the CHG Soap.  9.  Pat yourself dry with a clean towel.            10.  Wear clean pajamas.            11.  Place clean sheets on your bed the night of your first shower and do not sleep with pets.  Day of Surgery  Do not apply any lotions/deodorants the morning of surgery.  Please wear clean clothes to the hospital/surgery center.-   Special Instructions: -   Please read over the following fact sheets that you were given: Pain Booklet, Coughing and Deep Breathing, Blood Transfusion Information and Surgical Site Infection Prevention

## 2014-01-09 NOTE — Progress Notes (Signed)
01/09/14 1520  OBSTRUCTIVE SLEEP APNEA  Have you ever been diagnosed with sleep apnea through a sleep study? No  Do you snore loudly (loud enough to be heard through closed doors)?  0  Do you often feel tired, fatigued, or sleepy during the daytime? 0  Has anyone observed you stop breathing during your sleep? 0  Do you have, or are you being treated for high blood pressure? 1  BMI more than 35 kg/m2? 0  Age over 77 years old? 1  Neck circumference greater than 40 cm/16 inches? 1 (18.25)  Gender: 1  Obstructive Sleep Apnea Score 4  Score 4 or greater  Results sent to PCP

## 2014-01-12 NOTE — Progress Notes (Addendum)
Anesthesia Chart Review:  Patient is a 79 year old male scheduled for left L3-4, L4-5 foraminotomies on 01/16/14 by Dr. Otelia SergeantNitka.    History includes claustrophobia, former smoker, CAD/MI s/p PTCA to CX, LAD, RCA 2001 or prior s/p emergent CABG (LIMA to LAD, SVG to PDA, SVG to DIAG, SVG to OM) 01/09/00) following attempted PCI to RCA for unstable angina complicated by  acute dissection of the RCA, angina (agina, class II, stable; "minimal to no exertional angina" by Dr. Ledell Nossheek's 10/28/13 notes), HTN, nephrolithiasis, arthritis, DM2, right TKR 07/2010, back surgery, cataract extraction. PCP is Dr. Pricilla HolmSherry Ryter-Brown who medically cleared patient for this procedure.  Cardiologist is Dr. Beverely Paceheek [facility]) who cleared patient from a cardiac standpoint.    EKG on 10/28/13 [facility]) showed: SR, occasional PACs, RSR prime in V1.  Stress echo on 03/29/11 ([facility]) showed: Functional capacity is fair for age/sez - 7.1 METS on the 2 minute Bruce protocol; normal resting biventricular function (EF) with no resting segmental abnormality; no clinical or echocardiographic ischemia (induced wall motion abnormality; Negative stress echocardiogram; patient did have mild chest pain at peak of exercise; resolved within a minute of rest.  (A nuclear stress test was initially attempted on 12/20/10, but he was unable to complete study due to sever claustrophobia.)  His last cardiac cath noted was from 2001 prior to his CABG.  Preoperative CXR and labs noted. PAT RN notes indicate that T&S will be done on arrival--not done at PAT due to extreme claustrophobia (presumed from arm band that would be needed).  He has medical and cardiac clearance, so if no acute changes then I would anticipate that he could proceed as planned. Further evaluation and definitive anesthesia plan following anesthesiologist evaluation on the day of surgery.  Jeffrey Ochsllison Sinclaire Artiga, PA-C [facility]Anesthesiology Phone [phone]  [phone] 01/12/2014 11:54 AM

## 2014-01-14 NOTE — H&P (Signed)
Leafy Jeffrey Nelson is an 79 y.o. male.   Chief Complaint: back and left leg pain HPI: Pt with progressive worsening of his back pain and left LE numbness, tingling and weakness.  He has numbness in the left foot dorsally and plantarward.Marland Kitchen.  MRI studies have shown severe foraminal narrowing left L3-4 and L4-5 with severe disc space narrowing and spondylosis of the facet joints.  Central portions of the canal were maintained.  Pt has been treated with ESIs without relief of his symptoms.  He cannot stand or walk distance and spends most of his time sitting.   It is recommended that he undergo left L3-4 and left L4-5 foraminotomies.  Pt wishes to proceed.  He was evaluated by his PCP and cardiologist and cleared for surgery.  Past Medical History  Diagnosis Date  . Coronary artery disease   . Myocardial infarction   . Anginal pain     occ  . Hypertension   . Bronchitis     hx  . Diabetes mellitus without complication   . History of kidney stones   . Arthritis   . Complication of anesthesia     extremely claustrophobic    Past Surgical History  Procedure Laterality Date  . Coronary artery bypass graft  01  . Joint replacement Right 12  . Back surgery    . Cardiac catheterization    . Eye surgery Bilateral 13    cataracts    No family history on file. Social History:  reports that he quit smoking about 57 years ago. He does not have any smokeless tobacco history on file. He reports that he does not drink alcohol or use illicit drugs.  Allergies:  Allergies  Allergen Reactions  . Morphine And Related Other (See Comments)    "MAKES ME CRAZY"    Medications Prior to Admission  Medication Sig Dispense Refill  . amLODipine (NORVASC) 5 MG tablet Take 5 mg by mouth daily.      Marland Kitchen. aspirin EC 81 MG tablet Take 81 mg by mouth daily.      Marland Kitchen. atenolol (TENORMIN) 50 MG tablet Take 50 mg by mouth daily.      . Coenzyme Q10 (COQ10 PO) Take 1 tablet by mouth daily.      . Cyanocobalamin (VITAMIN  B-12 PO) Take 1 tablet by mouth daily.      . cyclobenzaprine (FLEXERIL) 5 MG tablet Take 5 mg by mouth every 8 (eight) hours as needed for muscle spasms.      . metFORMIN (GLUCOPHAGE) 500 MG tablet Take 500 mg by mouth daily with breakfast.      . Multiple Vitamin (MULTIVITAMIN WITH MINERALS) TABS tablet Take 1 tablet by mouth daily.      . Omega-3 Fatty Acids (FISH OIL) 1000 MG CAPS Take 1,000 mg by mouth 1 day or 1 dose.      Marland Kitchen. OVER THE COUNTER MEDICATION every morning. metamucil      . oxyCODONE-acetaminophen (PERCOCET/ROXICET) 5-325 MG per tablet Take 1 tablet by mouth every 4 (four) hours as needed for severe pain.        Results for orders placed during the hospital encounter of 01/16/14 (from the past 48 hour(s))  TYPE AND SCREEN     Status: None   Collection Time    01/16/14 10:50 AM      Result Value Ref Range   ABO/RH(D) O NEG     Antibody Screen NEG     Sample Expiration 01/19/2014  GLUCOSE, CAPILLARY     Status: Abnormal   Collection Time    01/16/14 10:59 AM      Result Value Ref Range   Glucose-Capillary 131 (*) 70 - 99 mg/dL   No results found.  Review of Systems  Constitutional: Negative.   HENT: Negative.   Eyes: Negative.   Respiratory: Negative.   Cardiovascular: Negative.   Gastrointestinal: Negative.   Genitourinary: Negative.   Musculoskeletal: Positive for back pain and joint pain.  Skin: Negative.   Neurological: Positive for focal weakness.  Endo/Heme/Allergies: Negative.   Psychiatric/Behavioral: Negative.     Pulse 55, temperature 97.8 F (36.6 C), temperature source Oral, resp. rate 20, SpO2 96.00%. Physical Exam  Constitutional: He is oriented to person, place, and time. He appears well-developed and well-nourished.  HENT:  Head: Normocephalic and atraumatic.  Eyes: EOM are normal. Pupils are equal, round, and reactive to light.  Neck: Normal range of motion. Neck supple.  Cardiovascular: Normal rate and regular rhythm.   Respiratory:  Effort normal and breath sounds normal.  GI: Soft.  Musculoskeletal:  Left foot dorsiflexion 4+/5 Left knee extension 5-/5.  Negative SLR.  Sever pain with revers straight leg raise and extension of the back.  Left leg limp.    Neurological: He is alert and oriented to person, place, and time.  Skin: Skin is warm and dry.  Psychiatric: He has a normal mood and affect.     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  Kerrin ChampagneJames E Melquan Ernsberger 01/16/2014, 12:40 PM

## 2014-01-15 MED ORDER — CHLORHEXIDINE GLUCONATE 4 % EX LIQD
60.0000 mL | Freq: Once | CUTANEOUS | Status: DC
  Filled 2014-01-15: qty 60

## 2014-01-15 MED ORDER — BUPIVACAINE LIPOSOME 1.3 % IJ SUSP
20.0000 mL | Freq: Once | INTRAMUSCULAR | Status: DC
  Filled 2014-01-15: qty 20

## 2014-01-15 MED ORDER — CEFAZOLIN SODIUM-DEXTROSE 2-3 GM-% IV SOLR: 2.0000 g | INTRAVENOUS | Status: DC

## 2014-01-16 ENCOUNTER — Ambulatory Visit (HOSPITAL_COMMUNITY)
Admission: RE | Admit: 2014-01-16 | Discharge: 2014-01-17 | Disposition: A | Discharge status: Home / Self Care | Payer: Medicare Other | Source: Ambulatory Visit | Attending: Specialist | Admitting: Specialist

## 2014-01-16 ENCOUNTER — Encounter (HOSPITAL_COMMUNITY): Payer: Medicare Other | Admitting: Vascular Surgery

## 2014-01-16 ENCOUNTER — Encounter (HOSPITAL_COMMUNITY)
Admission: RE | Disposition: A | Discharge status: Home / Self Care | Payer: Self-pay | Source: Ambulatory Visit | Attending: Specialist

## 2014-01-16 ENCOUNTER — Ambulatory Visit (HOSPITAL_COMMUNITY): Payer: Medicare Other | Admitting: Certified Registered Nurse Anesthetist

## 2014-01-16 ENCOUNTER — Encounter (HOSPITAL_COMMUNITY): Payer: Self-pay | Admitting: Certified Registered Nurse Anesthetist

## 2014-01-16 ENCOUNTER — Ambulatory Visit (HOSPITAL_COMMUNITY): Payer: Medicare Other

## 2014-01-16 DIAGNOSIS — M48062 Spinal stenosis, lumbar region with neurogenic claudication: Secondary | ICD-10-CM

## 2014-01-16 DIAGNOSIS — Z9660 Presence of unspecified orthopedic joint implant: Secondary | ICD-10-CM | POA: Insufficient documentation

## 2014-01-16 DIAGNOSIS — Z79899 Other long term (current) drug therapy: Secondary | ICD-10-CM | POA: Insufficient documentation

## 2014-01-16 DIAGNOSIS — E119 Type 2 diabetes mellitus without complications: Secondary | ICD-10-CM | POA: Insufficient documentation

## 2014-01-16 DIAGNOSIS — Z951 Presence of aortocoronary bypass graft: Secondary | ICD-10-CM | POA: Insufficient documentation

## 2014-01-16 DIAGNOSIS — I2510 Atherosclerotic heart disease of native coronary artery without angina pectoris: Secondary | ICD-10-CM | POA: Insufficient documentation

## 2014-01-16 DIAGNOSIS — I10 Essential (primary) hypertension: Secondary | ICD-10-CM | POA: Insufficient documentation

## 2014-01-16 DIAGNOSIS — Z87891 Personal history of nicotine dependence: Secondary | ICD-10-CM | POA: Insufficient documentation

## 2014-01-16 DIAGNOSIS — I252 Old myocardial infarction: Secondary | ICD-10-CM | POA: Insufficient documentation

## 2014-01-16 DIAGNOSIS — Z7982 Long term (current) use of aspirin: Secondary | ICD-10-CM | POA: Insufficient documentation

## 2014-01-16 DIAGNOSIS — M47817 Spondylosis without myelopathy or radiculopathy, lumbosacral region: Secondary | ICD-10-CM | POA: Insufficient documentation

## 2014-01-16 DIAGNOSIS — M129 Arthropathy, unspecified: Secondary | ICD-10-CM | POA: Insufficient documentation

## 2014-01-16 HISTORY — PX: LUMBAR LAMINECTOMY/DECOMPRESSION MICRODISCECTOMY: SHX5026

## 2014-01-16 LAB — TYPE AND SCREEN
ABO/RH(D): O NEG
Antibody Screen: NEGATIVE

## 2014-01-16 LAB — GLUCOSE, CAPILLARY
GLUCOSE-CAPILLARY: 106 mg/dL — AB (ref 70–99)
GLUCOSE-CAPILLARY: 118 mg/dL — AB (ref 70–99)
Glucose-Capillary: 131 mg/dL — ABNORMAL HIGH (ref 70–99)
Glucose-Capillary: 126 mg/dL — ABNORMAL HIGH (ref 70–99)

## 2014-01-16 SURGERY — LUMBAR LAMINECTOMY/DECOMPRESSION MICRODISCECTOMY
Anesthesia: General | Site: Spine Lumbar

## 2014-01-16 MED ORDER — MENTHOL 3 MG MT LOZG
1.0000 | LOZENGE | OROMUCOSAL | Status: DC | PRN
  Administered 2014-01-17: 3 mg via ORAL
  Filled 2014-01-16: qty 9

## 2014-01-16 MED ORDER — THROMBIN 20000 UNITS EX SOLR
CUTANEOUS | Status: AC
  Filled 2014-01-16: qty 20000

## 2014-01-16 MED ORDER — OXYCODONE HCL 5 MG/5ML PO SOLN: 5.0000 mg | Freq: Once | ORAL | Status: AC | PRN

## 2014-01-16 MED ORDER — ROCURONIUM BROMIDE 50 MG/5ML IV SOLN
INTRAVENOUS | Status: AC
  Filled 2014-01-16: qty 1

## 2014-01-16 MED ORDER — EPHEDRINE SULFATE 50 MG/ML IJ SOLN
INTRAMUSCULAR | Status: DC | PRN
  Administered 2014-01-16 (×3): 10 mg via INTRAVENOUS
  Administered 2014-01-16 (×2): 5 mg via INTRAVENOUS
  Administered 2014-01-16: 10 mg via INTRAVENOUS

## 2014-01-16 MED ORDER — HYDROCODONE-ACETAMINOPHEN 5-325 MG PO TABS: 1.0000 | ORAL_TABLET | ORAL | Status: DC | PRN

## 2014-01-16 MED ORDER — METHOCARBAMOL 1000 MG/10ML IJ SOLN
500.0000 mg | Freq: Four times a day (QID) | INTRAVENOUS | Status: DC | PRN
  Filled 2014-01-16: qty 5

## 2014-01-16 MED ORDER — FENTANYL CITRATE 0.05 MG/ML IJ SOLN
INTRAMUSCULAR | Status: DC | PRN
  Administered 2014-01-16: 150 ug via INTRAVENOUS

## 2014-01-16 MED ORDER — METHOCARBAMOL 500 MG PO TABS
500.0000 mg | ORAL_TABLET | Freq: Four times a day (QID) | ORAL | Status: DC | PRN
  Administered 2014-01-16: 500 mg via ORAL

## 2014-01-16 MED ORDER — ATENOLOL 50 MG PO TABS
50.0000 mg | ORAL_TABLET | Freq: Every day | ORAL | Status: DC
  Filled 2014-01-16: qty 1

## 2014-01-16 MED ORDER — HYDROMORPHONE HCL PF 1 MG/ML IJ SOLN
0.2500 mg | INTRAMUSCULAR | Status: DC | PRN
  Administered 2014-01-16 (×2): 0.5 mg via INTRAVENOUS

## 2014-01-16 MED ORDER — GLYCOPYRROLATE 0.2 MG/ML IJ SOLN
INTRAMUSCULAR | Status: AC
  Filled 2014-01-16: qty 4

## 2014-01-16 MED ORDER — PROPOFOL 10 MG/ML IV BOLUS
INTRAVENOUS | Status: DC | PRN
  Administered 2014-01-16: 150 mg via INTRAVENOUS

## 2014-01-16 MED ORDER — ONDANSETRON HCL 4 MG/2ML IJ SOLN
INTRAMUSCULAR | Status: DC | PRN
  Administered 2014-01-16: 4 mg via INTRAVENOUS

## 2014-01-16 MED ORDER — GLYCOPYRROLATE 0.2 MG/ML IJ SOLN
INTRAMUSCULAR | Status: DC | PRN
  Administered 2014-01-16: .8 mg via INTRAVENOUS

## 2014-01-16 MED ORDER — BUPIVACAINE LIPOSOME 1.3 % IJ SUSP
INTRAMUSCULAR | Status: DC | PRN
  Administered 2014-01-16: 20 mL

## 2014-01-16 MED ORDER — MIDAZOLAM HCL 2 MG/2ML IJ SOLN
INTRAMUSCULAR | Status: AC
  Filled 2014-01-16: qty 2

## 2014-01-16 MED ORDER — LIDOCAINE HCL (CARDIAC) 20 MG/ML IV SOLN
INTRAVENOUS | Status: AC
  Filled 2014-01-16: qty 5

## 2014-01-16 MED ORDER — BUPIVACAINE-EPINEPHRINE 0.5% -1:200000 IJ SOLN
INTRAMUSCULAR | Status: DC | PRN
  Administered 2014-01-16: 30 mL

## 2014-01-16 MED ORDER — KETOROLAC TROMETHAMINE 30 MG/ML IJ SOLN
INTRAMUSCULAR | Status: AC
  Filled 2014-01-16: qty 1

## 2014-01-16 MED ORDER — ONDANSETRON HCL 4 MG/2ML IJ SOLN
INTRAMUSCULAR | Status: AC
  Filled 2014-01-16: qty 2

## 2014-01-16 MED ORDER — PHENYLEPHRINE HCL 10 MG/ML IJ SOLN
10.0000 mg | INTRAVENOUS | Status: DC | PRN
  Administered 2014-01-16: 20 ug/min via INTRAVENOUS

## 2014-01-16 MED ORDER — ACETAMINOPHEN 650 MG RE SUPP: 650.0000 mg | RECTAL | Status: DC | PRN

## 2014-01-16 MED ORDER — BUPIVACAINE-EPINEPHRINE (PF) 0.5% -1:200000 IJ SOLN
INTRAMUSCULAR | Status: AC
  Filled 2014-01-16: qty 30

## 2014-01-16 MED ORDER — OXYCODONE-ACETAMINOPHEN 5-325 MG PO TABS: 1.0000 | ORAL_TABLET | ORAL | Status: DC | PRN

## 2014-01-16 MED ORDER — LIDOCAINE HCL (CARDIAC) 20 MG/ML IV SOLN
INTRAVENOUS | Status: DC | PRN
  Administered 2014-01-16: 80 mg via INTRAVENOUS

## 2014-01-16 MED ORDER — OXYCODONE HCL 5 MG PO TABS
5.0000 mg | ORAL_TABLET | Freq: Once | ORAL | Status: AC | PRN
  Administered 2014-01-16: 5 mg via ORAL

## 2014-01-16 MED ORDER — ONDANSETRON HCL 4 MG/2ML IJ SOLN
4.0000 mg | INTRAMUSCULAR | Status: DC | PRN
  Administered 2014-01-16: 4 mg via INTRAVENOUS
  Filled 2014-01-16: qty 2

## 2014-01-16 MED ORDER — KETOROLAC TROMETHAMINE 30 MG/ML IJ SOLN
30.0000 mg | Freq: Once | INTRAMUSCULAR | Status: AC
  Administered 2014-01-16: 30 mg via INTRAVENOUS

## 2014-01-16 MED ORDER — CEFAZOLIN SODIUM 1-5 GM-% IV SOLN
1.0000 g | Freq: Three times a day (TID) | INTRAVENOUS | Status: AC
  Administered 2014-01-16 – 2014-01-17 (×2): 1 g via INTRAVENOUS
  Filled 2014-01-16 (×2): qty 50

## 2014-01-16 MED ORDER — MIDAZOLAM HCL 5 MG/5ML IJ SOLN
INTRAMUSCULAR | Status: DC | PRN
  Administered 2014-01-16: 2 mg via INTRAVENOUS

## 2014-01-16 MED ORDER — SODIUM CHLORIDE 0.9 % IJ SOLN
3.0000 mL | Freq: Two times a day (BID) | INTRAMUSCULAR | Status: DC
  Administered 2014-01-16: 3 mL via INTRAVENOUS

## 2014-01-16 MED ORDER — VECURONIUM BROMIDE 10 MG IV SOLR
INTRAVENOUS | Status: DC | PRN
  Administered 2014-01-16 (×3): 1 mg via INTRAVENOUS

## 2014-01-16 MED ORDER — HYDROMORPHONE HCL PF 1 MG/ML IJ SOLN: 0.5000 mg | INTRAMUSCULAR | Status: DC | PRN

## 2014-01-16 MED ORDER — THROMBIN 20000 UNITS EX SOLR
CUTANEOUS | Status: DC | PRN
  Administered 2014-01-16: 13:00:00 via TOPICAL

## 2014-01-16 MED ORDER — LACTATED RINGERS IV SOLN
INTRAVENOUS | Status: DC
  Administered 2014-01-16: 12:00:00 via INTRAVENOUS

## 2014-01-16 MED ORDER — HYDROMORPHONE HCL PF 1 MG/ML IJ SOLN
INTRAMUSCULAR | Status: AC
  Filled 2014-01-16: qty 1

## 2014-01-16 MED ORDER — ONDANSETRON HCL 4 MG/2ML IJ SOLN: 4.0000 mg | Freq: Once | INTRAMUSCULAR | Status: DC | PRN

## 2014-01-16 MED ORDER — PROPOFOL 10 MG/ML IV BOLUS
INTRAVENOUS | Status: AC
  Filled 2014-01-16: qty 20

## 2014-01-16 MED ORDER — ASPIRIN EC 81 MG PO TBEC
81.0000 mg | DELAYED_RELEASE_TABLET | Freq: Every day | ORAL | Status: DC
  Administered 2014-01-16: 81 mg via ORAL
  Filled 2014-01-16 (×2): qty 1

## 2014-01-16 MED ORDER — ROCURONIUM BROMIDE 100 MG/10ML IV SOLN
INTRAVENOUS | Status: DC | PRN
  Administered 2014-01-16: 50 mg via INTRAVENOUS

## 2014-01-16 MED ORDER — 0.9 % SODIUM CHLORIDE (POUR BTL) OPTIME
TOPICAL | Status: DC | PRN
  Administered 2014-01-16: 1000 mL

## 2014-01-16 MED ORDER — ARTIFICIAL TEARS OP OINT
TOPICAL_OINTMENT | OPHTHALMIC | Status: AC
  Filled 2014-01-16: qty 3.5

## 2014-01-16 MED ORDER — LACTATED RINGERS IV SOLN
INTRAVENOUS | Status: DC | PRN
  Administered 2014-01-16 (×2): via INTRAVENOUS

## 2014-01-16 MED ORDER — METFORMIN HCL 500 MG PO TABS
500.0000 mg | ORAL_TABLET | Freq: Every day | ORAL | Status: DC
  Administered 2014-01-17: 500 mg via ORAL
  Filled 2014-01-16 (×2): qty 1

## 2014-01-16 MED ORDER — SODIUM CHLORIDE 0.45 % IV SOLN
INTRAVENOUS | Status: DC
  Administered 2014-01-17: via INTRAVENOUS

## 2014-01-16 MED ORDER — ARTIFICIAL TEARS OP OINT
TOPICAL_OINTMENT | OPHTHALMIC | Status: DC | PRN
  Administered 2014-01-16: 1 via OPHTHALMIC

## 2014-01-16 MED ORDER — OXYCODONE-ACETAMINOPHEN 5-325 MG PO TABS
1.0000 | ORAL_TABLET | ORAL | Status: DC | PRN
  Administered 2014-01-17: 1 via ORAL
  Filled 2014-01-16: qty 1

## 2014-01-16 MED ORDER — BUPIVACAINE HCL (PF) 0.25 % IJ SOLN
INTRAMUSCULAR | Status: AC
  Filled 2014-01-16: qty 30

## 2014-01-16 MED ORDER — AMLODIPINE BESYLATE 5 MG PO TABS
5.0000 mg | ORAL_TABLET | Freq: Every day | ORAL | Status: DC
  Filled 2014-01-16: qty 1

## 2014-01-16 MED ORDER — NEOSTIGMINE METHYLSULFATE 10 MG/10ML IV SOLN
INTRAVENOUS | Status: DC | PRN
  Administered 2014-01-16: 5 mg via INTRAVENOUS

## 2014-01-16 MED ORDER — ACETAMINOPHEN 325 MG PO TABS: 650.0000 mg | ORAL_TABLET | ORAL | Status: DC | PRN

## 2014-01-16 MED ORDER — CYCLOBENZAPRINE HCL 5 MG PO TABS
5.0000 mg | ORAL_TABLET | Freq: Three times a day (TID) | ORAL | Status: DC | PRN
  Filled 2014-01-16: qty 1

## 2014-01-16 MED ORDER — VECURONIUM BROMIDE 10 MG IV SOLR
INTRAVENOUS | Status: AC
  Filled 2014-01-16: qty 10

## 2014-01-16 MED ORDER — SODIUM CHLORIDE 0.9 % IJ SOLN: 3.0000 mL | INTRAMUSCULAR | Status: DC | PRN

## 2014-01-16 MED ORDER — NEOSTIGMINE METHYLSULFATE 10 MG/10ML IV SOLN
INTRAVENOUS | Status: AC
  Filled 2014-01-16: qty 1

## 2014-01-16 MED ORDER — ALUM & MAG HYDROXIDE-SIMETH 200-200-20 MG/5ML PO SUSP: 30.0000 mL | Freq: Four times a day (QID) | ORAL | Status: DC | PRN

## 2014-01-16 MED ORDER — BUPIVACAINE HCL (PF) 0.25 % IJ SOLN
INTRAMUSCULAR | Status: DC | PRN
  Administered 2014-01-16: 30 mL

## 2014-01-16 MED ORDER — FENTANYL CITRATE 0.05 MG/ML IJ SOLN
INTRAMUSCULAR | Status: AC
  Filled 2014-01-16: qty 5

## 2014-01-16 MED ORDER — PHENOL 1.4 % MT LIQD: 1.0000 | OROMUCOSAL | Status: DC | PRN

## 2014-01-16 MED ORDER — OXYCODONE HCL 5 MG PO TABS
ORAL_TABLET | ORAL | Status: AC
  Filled 2014-01-16: qty 1

## 2014-01-16 MED ORDER — METHOCARBAMOL 500 MG PO TABS: 500.0000 mg | ORAL_TABLET | Freq: Four times a day (QID) | ORAL | Status: DC | PRN

## 2014-01-16 MED ORDER — CEFAZOLIN SODIUM-DEXTROSE 2-3 GM-% IV SOLR
INTRAVENOUS | Status: AC
  Administered 2014-01-16: 2 g via INTRAVENOUS
  Filled 2014-01-16: qty 50

## 2014-01-16 MED ORDER — METHOCARBAMOL 500 MG PO TABS
ORAL_TABLET | ORAL | Status: AC
  Filled 2014-01-16: qty 1

## 2014-01-16 SURGICAL SUPPLY — 56 items
ADH SKN CLS APL DERMABOND .7 (GAUZE/BANDAGES/DRESSINGS) ×1
AIRSTRIP 3X4 (GAUZE/BANDAGES/DRESSINGS) ×3 IMPLANT
BUR ROUND FLUTED 4 SOFT TCH (BURR) ×2 IMPLANT
BUR ROUND FLUTED 4MM SOFT TCH (BURR) ×1
CANISTER SUCT 3000ML (MISCELLANEOUS) ×3 IMPLANT
CORDS BIPOLAR (ELECTRODE) ×3 IMPLANT
DERMABOND ADVANCED (GAUZE/BANDAGES/DRESSINGS) ×2
DERMABOND ADVANCED .7 DNX12 (GAUZE/BANDAGES/DRESSINGS) ×1 IMPLANT
DRAPE INCISE IOBAN 66X45 STRL (DRAPES) IMPLANT
DRAPE MICROSCOPE LEICA (MISCELLANEOUS) ×3 IMPLANT
DRAPE POUCH INSTRU U-SHP 10X18 (DRAPES) ×3 IMPLANT
DRAPE PROXIMA HALF (DRAPES) IMPLANT
DRAPE SURG 17X23 STRL (DRAPES) ×12 IMPLANT
DRSG MEPILEX BORDER 4X4 (GAUZE/BANDAGES/DRESSINGS) ×3 IMPLANT
DRSG MEPILEX BORDER 4X8 (GAUZE/BANDAGES/DRESSINGS) IMPLANT
DURAPREP 26ML APPLICATOR (WOUND CARE) ×3 IMPLANT
ELECT CAUTERY BLADE 6.4 (BLADE) ×3 IMPLANT
ELECT REM PT RETURN 9FT ADLT (ELECTROSURGICAL) ×3
ELECTRODE REM PT RTRN 9FT ADLT (ELECTROSURGICAL) ×1 IMPLANT
EVACUATOR 1/8 PVC DRAIN (DRAIN) IMPLANT
GLOVE BIOGEL PI IND STRL 7.5 (GLOVE) ×1 IMPLANT
GLOVE BIOGEL PI INDICATOR 7.5 (GLOVE) ×2
GLOVE ECLIPSE 7.0 STRL STRAW (GLOVE) ×3 IMPLANT
GLOVE ECLIPSE 8.5 STRL (GLOVE) ×3 IMPLANT
GLOVE SURG 8.5 LATEX PF (GLOVE) ×3 IMPLANT
GOWN STRL REUS W/ TWL LRG LVL3 (GOWN DISPOSABLE) ×2 IMPLANT
GOWN STRL REUS W/TWL 2XL LVL3 (GOWN DISPOSABLE) ×3 IMPLANT
GOWN STRL REUS W/TWL LRG LVL3 (GOWN DISPOSABLE) ×6
KIT BASIN OR (CUSTOM PROCEDURE TRAY) ×3 IMPLANT
KIT ROOM TURNOVER OR (KITS) ×3 IMPLANT
MANIFOLD NEPTUNE II (INSTRUMENTS) IMPLANT
NEEDLE 22X1 1/2 (OR ONLY) (NEEDLE) ×3 IMPLANT
NEEDLE SPNL 18GX3.5 QUINCKE PK (NEEDLE) ×6 IMPLANT
NS IRRIG 1000ML POUR BTL (IV SOLUTION) ×3 IMPLANT
PACK LAMINECTOMY ORTHO (CUSTOM PROCEDURE TRAY) ×3 IMPLANT
PAD ARMBOARD 7.5X6 YLW CONV (MISCELLANEOUS) ×6 IMPLANT
PATTIES SURGICAL .5 X.5 (GAUZE/BANDAGES/DRESSINGS) IMPLANT
PATTIES SURGICAL .75X.75 (GAUZE/BANDAGES/DRESSINGS) IMPLANT
PATTIES SURGICAL 1X1 (DISPOSABLE) IMPLANT
SPECIMEN JAR SMALL (MISCELLANEOUS) ×3 IMPLANT
SPONGE LAP 4X18 X RAY DECT (DISPOSABLE) ×3 IMPLANT
SPONGE SURGIFOAM ABS GEL 100 (HEMOSTASIS) ×3 IMPLANT
SUT VIC AB 0 CT1 27 (SUTURE) ×6
SUT VIC AB 0 CT1 27XBRD ANBCTR (SUTURE) ×2 IMPLANT
SUT VIC AB 1 CT1 27 (SUTURE) ×6
SUT VIC AB 1 CT1 27XBRD ANBCTR (SUTURE) ×2 IMPLANT
SUT VIC AB 2-0 CT1 27 (SUTURE)
SUT VIC AB 2-0 CT1 TAPERPNT 27 (SUTURE) IMPLANT
SUT VICRYL 0 UR6 27IN ABS (SUTURE) IMPLANT
SUT VICRYL 4-0 PS2 18IN ABS (SUTURE) IMPLANT
SYR CONTROL 10ML LL (SYRINGE) ×3 IMPLANT
TOWEL OR 17X24 6PK STRL BLUE (TOWEL DISPOSABLE) ×3 IMPLANT
TOWEL OR 17X26 10 PK STRL BLUE (TOWEL DISPOSABLE) ×3 IMPLANT
TRAY FOLEY CATH 16FRSI W/METER (SET/KITS/TRAYS/PACK) IMPLANT
WATER STERILE IRR 1000ML POUR (IV SOLUTION) IMPLANT
YANKAUER SUCT BULB TIP NO VENT (SUCTIONS) ×3 IMPLANT

## 2014-01-16 NOTE — Interval H&P Note (Signed)
History and Physical Interval Note:  01/16/2014 12:41 PM  Leafy Half  has presented today for surgery, with the diagnosis of Left L3 and L4 Foraminal stenosis  The various methods of treatment have been discussed with the patient and family. After consideration of risks, benefits and other options for treatment, the patient has consented to  Procedure(s): Left L3-4 and L4-5 foraminotomies (N/A) as a surgical intervention .  The patient's history has been reviewed, patient examined, no change in status, stable for surgery.  I have reviewed the patient's chart and labs.  Questions were answered to the patient's satisfaction.     Kerrin Champagne

## 2014-01-16 NOTE — Anesthesia Postprocedure Evaluation (Signed)
  Anesthesia Post-op Note  Patient: Jeffrey Nelson  Procedure(s) Performed: Procedure(s): Left L3-4 and L4-5 foraminotomies (N/A)  Patient Location: PACU  Anesthesia Type:General  Level of Consciousness: awake and alert   Airway and Oxygen Therapy: Patient Spontanous Breathing  Post-op Pain: mild  Post-op Assessment: Post-op Vital signs reviewed, Patient's Cardiovascular Status Stable and Respiratory Function Stable  Post-op Vital Signs: Reviewed  Filed Vitals:   01/16/14 1615  BP:   Pulse: 55  Temp:   Resp: 12    Complications: No apparent anesthesia complications

## 2014-01-16 NOTE — Op Note (Signed)
01/16/2014  3:04 PM  PATIENT:  Jeffrey Nelson  79 y.o. male  MRN: [id number]  OPERATIVE REPORT  PRE-OPERATIVE DIAGNOSIS:  Left L3 and L4 Foraminal stenosis  POST-OPERATIVE DIAGNOSIS:  Left L3 and L4 Foraminal stenosis  PROCEDURE:  Procedure(s): Left L3-4 and L4-5 foraminotomies, Left L3-4 partial hemilaminectomy, left L4-5 hemilaminectomy. Decompression of left L3, L4 and L5 nerve roots.    SURGEON:  Jessy Oto, MD     ASSISTANT:  Phillips Hay, PA-C  (Present throughout the entire procedure and necessary for completion of procedure in a timely manner)     ANESTHESIA:  General, supplemented with local marcaine 1/2% 1:1 exparel 1/3% total 30 cc, Dr. Oren Bracket.    COMPLICATIONS:  None.  EBL: 100cc      PROCEDURE:The patient was met in the holding area, and the appropriate left Lumbar level L3-4 and L4-5 identified and marked with "x" and my initials.The patient was then transported to OR and was placed under general anesthesia without difficulty. The patient received appropriate preoperative antibiotic prophylaxis.The patient after intubation atraumatically was transferred to the operating room table, prone position, Needle frame, sliding OR table. All pressure points were well padded. The arms in 90-90 well-padded at the elbows. Standard prep with DuraPrep solution lower dorsal spine to the mid sacral segment. Draped in the usual manner iodine Vi-Drape was used. Time-out procedure was called and correct. 2x 18-gauge spinal needle was then inserted at the expected L3 and L4 level. C-arm was draped sterilely to the field and used to identify the spinal needles positions. The needle was at the lower aspect of the lamina of L4. Skin superior to this was then infiltrated with Marcaine half percent with 1:1 exparel 1.3% total of 10 cc used. An incision approximately an two inch and a half in length was then made through skin and subcutaneous layers in line with the left side of the  expected midline just superior to the spinal needle entry point. An incision made into the left lumbosacral fascia approximately two inches in length. The paralumbar muscles then elevated off the left L3 and L4 lamina. A boss McCollough retractor inserted. Bleeding controlled with bovie and bipolar electrocautery.The operating room microscope sterilely draped brought into the field. Under the operating room microscope, the L3-4 interspace carefully debrided the small amount of muscle attachment here and high-speed bur used to drill the medial aspect of the inferior articular process of L3 approximately 10%. A localization lateral radiograph view was obtained with kocher clamp on theL4 spinous process.The high speed burr used to thin the inferior L3 lamina and a 4 mm kerrison used to remove portions of the inferior aspect of the L3 lamina. The ligamentum flavum resected left L3-4 and the medial superior articular process of L4 then resected using an osteotome and 2 mm Kerrison. The L4 nerve root identified within the lateral recess flattened over the medial aspect of the L3-4 facet. Further foraminotomy was performed over the L3 nerve root the nerve root was noted to be decompressed. The nerve root able to be retracted along the medial aspect of the L4 pedicle.  Ligamentum flavum was further debrided inferiorly to the level L4lamina. The resection of the L4 lamina inferiorly was performed.  Ligamentum flavum was debrided and lateral recess along the medial aspect L4-5 facet no further decompression was necessary. Ball tip nerve probe was then able to carefully palpate the neuroforamen for L4 and L5 finding these to be well decompressed. Attention then turned to the  left L4-5 level which was easily visualized with the microscope. Soft tissues debrided about the posterior aspect of the L4-5 interspace. High-speed bur and then used to carefully drill inferior 3 or 4 mm of the left  side L4 lamina and on the medial  aspect of the right L4 inferior articular process of 3 mm. Ligamentum flavum then debrided at the L4-5 level with the 2 mm and 3 mm Kerrisons we decompressed the L5 nerve root and the lateral recess left L4-5 decompressed using 2 and 3 mm Kerrisons sizing hypertrophic reflected ligamentum flavum extending superiorly. From was resected off the ventral aspect of the inferior margin of the L4 lamina. Hockey-stick nerve probe could then be passed out the L4 neuroforamen and the L5 neuroforamen. Venous bleeding encountered. Thrombin-soaked Gelfoam used to control bleeding,and the L5 nerve root were mobilized medially and the L4-5 disc examined and found not to be herniated. Irrigation was carried out down to this bleeding controlled with Gelfoam. Gelfoam was then removed. Irrigation carried careful examination demonstrated no active bleeding present. Retractors were then carefully removed Since carefully then the Bleeding was then controlled using thrombin-soaked Gelfoam small cottonoids. Small amount of bleeding within the soft tissue mass the laminotomy area was controlled using bipolar electrocautery. Irrigation was carried out using copious amounts of irrigant solution. All Gelfoam were then removed. No significant active bleeding present at the time of removal. All instruments sponge counts were correct traction system was then carefully removed carefully rotating retractors with this withdrawal and only bipolar electrocautery of any small bleeders. Lumbodorsal fascia was then carefully approximated with interrupted 0 Vicryl sutures, UR 6 needle deep subcutaneous layers were approximated with interrupted 0 Vicryl sutures on UR 6 the appear subcutaneous layers approximated with interrupted 2-0 Vicryl sutures and the skin closed with a running subcutaneous stitch of 4-0 Vicryl. Dermabond was applied allowed to dry and then Mepilex bandage applied. Patient was then carefully returned to supine position on a stretcher,  reactivated and extubated. He was then returned to recovery room in satisfactory condition.  Phillips Hay PA-C perform the duties of assistant surgeon during this case. She was present from the beginning of the case to the end of the case assisting in transfer the patient from his stretcher to the OR table and back to the stretcher at the end of the case. Assisted in careful retraction and suction of the laminectomy site delicate neural structures operating under the operating room microscope. She performed closure of the incision from the fascia to the skin applying the dressing.     Jessy Oto  01/16/2014, 3:04 PM

## 2014-01-16 NOTE — Brief Op Note (Signed)
01/16/2014  3:00 PM  PATIENT:  Jeffrey Nelson  79 y.o. male  PRE-OPERATIVE DIAGNOSIS:  Left L3 and L4 Foraminal stenosis  POST-OPERATIVE DIAGNOSIS:  Left L3 and L4 Foraminal stenosis  PROCEDURE:  Procedure(s): Left L3-4 and L4-5 foraminotomies (N/A) Left L4-5 hemilaminectomy, left L3-4 partial hemilaminectomy  SURGEON:  Surgeon(s) and Role: Kerrin Champagne, MD - Primary  PHYSICIAN ASSISTANT: Maud Deed, PA-C  ANESTHESIA:   local and general  EBL:  Total I/O In: 1000 [I.V.:1000] Out: 200 [Blood:200]  BLOOD ADMINISTERED:none  DRAINS: none   LOCAL MEDICATIONS USED:  MARCAINE 1/2% 1:1 exparel 1.3%, Amount: 30 ml  Dr. Autumn Patty  SPECIMEN:  No Specimen  DISPOSITION OF SPECIMEN:  N/A  COUNTS:  YES  TOURNIQUET:  * No tourniquets in log *  DICTATION: .Dragon Dictation  PLAN OF CARE: Admit for overnight observation  PATIENT DISPOSITION:  PACU - hemodynamically stable.   Delay start of Pharmacological VTE agent (>24hrs) due to surgical blood loss or risk of bleeding: yes

## 2014-01-16 NOTE — Plan of Care (Signed)
Problem: Consults Goal: Diagnosis - Spinal Surgery Outcome: Completed/Met Date Met:  01/16/14 Lumbar Laminectomy (Complex)

## 2014-01-16 NOTE — Anesthesia Procedure Notes (Signed)
Procedure Name: Intubation Date/Time: 01/16/2014 1:02 PM Performed by: Vita Barley E Pre-anesthesia Checklist: Patient identified, Emergency Drugs available, Suction available and Patient being monitored Patient Re-evaluated:Patient Re-evaluated prior to inductionOxygen Delivery Method: Circle system utilized Preoxygenation: Pre-oxygenation with 100% oxygen Intubation Type: IV induction Ventilation: Mask ventilation without difficulty and Oral airway inserted - appropriate to patient size Laryngoscope Size: Hyacinth Meeker and 2 Grade View: Grade II Tube type: Oral Tube size: 7.5 mm Number of attempts: 1 Airway Equipment and Method: Stylet and Oral airway Placement Confirmation: ETT inserted through vocal cords under direct vision,  positive ETCO2 and breath sounds checked- equal and bilateral Secured at: 23 cm Tube secured with: Tape Dental Injury: Teeth and Oropharynx as per pre-operative assessment

## 2014-01-16 NOTE — Discharge Instructions (Signed)
    No lifting greater than 10 lbs. Avoid bending, stooping and twisting. Walk in house for first week them may start to get out slowly increasing distance up to one mile by 3 weeks post op. Keep incision dry for 3 days, may use tegaderm or similar water impervious dressing.  

## 2014-01-16 NOTE — Anesthesia Preprocedure Evaluation (Addendum)
Anesthesia Evaluation  Patient identified by MRN, date of birth, ID band Patient awake    Reviewed: Allergy & Precautions, H&P , NPO status , Patient's Chart, lab work & pertinent test results, reviewed documented beta blocker date and time   Airway Mallampati: I TM Distance: >3 FB Neck ROM: Full    Dental  (+) Teeth Intact, Dental Advisory Given   Pulmonary former smoker,  breath sounds clear to auscultation        Cardiovascular hypertension, Pt. on medications and Pt. on home beta blockers + CAD, + Past MI and + CABG Rhythm:Regular Rate:Normal     Neuro/Psych    GI/Hepatic   Endo/Other  diabetes, Well Controlled, Type 2, Oral Hypoglycemic Agents  Renal/GU      Musculoskeletal  (+) Arthritis -,   Abdominal   Peds  Hematology   Anesthesia Other Findings   Reproductive/Obstetrics                         Anesthesia Physical Anesthesia Plan  ASA: III  Anesthesia Plan: General   Post-op Pain Management:    Induction: Intravenous  Airway Management Planned: Oral ETT  Additional Equipment:   Intra-op Plan:   Post-operative Plan: Extubation in OR  Informed Consent: I have reviewed the patients History and Physical, chart, labs and discussed the procedure including the risks, benefits and alternatives for the proposed anesthesia with the patient or authorized representative who has indicated his/her understanding and acceptance.   Dental advisory given  Plan Discussed with: Anesthesiologist, Surgeon and CRNA  Anesthesia Plan Comments:         Anesthesia Quick Evaluation

## 2014-01-16 NOTE — Transfer of Care (Signed)
Immediate Anesthesia Transfer of Care Note  Patient: Jeffrey Nelson  Procedure(s) Performed: Procedure(s): Left L3-4 and L4-5 foraminotomies (N/A)  Patient Location: PACU  Anesthesia Type:General  Level of Consciousness: awake and patient cooperative  Airway & Oxygen Therapy: Patient Spontanous Breathing and Patient connected to nasal cannula oxygen  Post-op Assessment: Report given to PACU RN and Patient moving all extremities X 4  Post vital signs: Reviewed and stable  Complications: No apparent anesthesia complications

## 2014-01-17 LAB — GLUCOSE, CAPILLARY: GLUCOSE-CAPILLARY: 119 mg/dL — AB (ref 70–99)

## 2014-01-17 NOTE — Progress Notes (Signed)
  Pt. Alert and oriented,follows simple instructions, denies pain. Incision area without swelling, redness or S/S of infection. Voiding adequate clear yellow urine. Moving all extremities well and vitals stable and documented. Patient discharged home with spouse. Lumbar surgery notes instructions given to patient and family member for home safety and precautions. Pt. and family stated understanding of instructions given.  

## 2014-01-17 NOTE — Progress Notes (Signed)
Physical Therapy Evaluation Patient Details Name: Leafy HalfMelvin W Corey MRN: [id number] DOB: 09-Dec-1934 Today's Date: 01/17/2014   History of Present Illness  Patient is a 79 yo male admitted 01/16/14 now s/p Left L3-4 and L4-5 foraminotomies (N/A) Left L4-5 hemilaminectomy, left L3-4 partial hemilaminectomy  Clinical Impression  Patient performs all mobility/gait with supervision for safety only.  Provided all education to patient and wife.  Patient ready for discharge from PT perspective.    Follow Up Recommendations No PT follow up;Supervision - Intermittent    Equipment Recommendations  None recommended by PT    Recommendations for Other Services       Precautions / Restrictions Precautions Precautions: Fall;Back Precaution Booklet Issued: Yes (comment) Precaution Comments: Reviewed back precautions with patient and his wife. Restrictions Weight Bearing Restrictions: No      Mobility  Bed Mobility Overal bed mobility: Needs Assistance Bed Mobility: Sidelying to Sit   Sidelying to sit: Supervision       General bed mobility comments: vc to log roll  Transfers Overall transfer level: Modified independent Equipment used: None             General transfer comment: Verbal cues for technique.  No physical assist needed, but required increased time.  Ambulation/Gait Ambulation/Gait assistance: Supervision Ambulation Distance (Feet): 150 Feet Assistive device: None Gait Pattern/deviations: Step-through pattern   Gait velocity interpretation: at or above normal speed for age/gender General Gait Details: Good gait pattern, balance, and speed.  Supervision for safety only.  Stairs Stairs: Yes Stairs assistance: Supervision Stair Management: One rail Left;Alternating pattern;Forwards Number of Stairs: 3 General stair comments: Instructed patient on safe stair negotiation.  Instructed wife on proper guarding technique.  Wheelchair Mobility    Modified Rankin  (Stroke Patients Only)       Balance Overall balance assessment: No apparent balance deficits (not formally assessed)                                           Pertinent Vitals/Pain     Home Living Family/patient expects to be discharged to:: Private residence Living Arrangements: Spouse/significant other Available Help at Discharge: Family;Available 24 hours/day Type of Home: House Home Access: Stairs to enter Entrance Stairs-Rails: None Entrance Stairs-Number of Steps: 3 Home Layout: One level Home Equipment: Walker - 2 wheels;Cane - single point;Bedside commode;Shower seat - built in      Prior Function Level of Independence: Independent with assistive device(s)         Comments: Used cane if needed due to pain     Hand Dominance        Extremity/Trunk Assessment   Upper Extremity Assessment: Defer to OT evaluation           Lower Extremity Assessment: Overall WFL for tasks assessed      Cervical / Trunk Assessment: Normal  Communication   Communication: HOH  Cognition Arousal/Alertness: Awake/alert Behavior During Therapy: WFL for tasks assessed/performed Overall Cognitive Status: Within Functional Limits for tasks assessed                      General Comments      Exercises        Assessment/Plan    PT Assessment Patent does not need any further PT services  PT Diagnosis     PT Problem List    PT Treatment Interventions  PT Goals (Current goals can be found in the Care Plan section) Acute Rehab PT Goals Patient Stated Goal: go home PT Goal Formulation: No goals set, d/c therapy    Frequency     Barriers to discharge        Co-evaluation               End of Session   Activity Tolerance: Patient tolerated treatment well Patient left: in chair;with call bell/phone within reach;with family/visitor present Nurse Communication: Mobility status    Functional Assessment Tool Used: Clinical  judgement Functional Limitation: Mobility: Walking and moving around Mobility: Walking and Moving Around Current Status ([id number]): At least 1 percent but less than 20 percent impaired, limited or restricted Mobility: Walking and Moving Around Goal Status [phone]): At least 1 percent but less than 20 percent impaired, limited or restricted Mobility: Walking and Moving Around Discharge Status [phone]): At least 1 percent but less than 20 percent impaired, limited or restricted    Time: 0821-0830 PT Time Calculation (min): 9 min   Charges:   PT Evaluation $Initial PT Evaluation Tier I: 1 Procedure     PT G Codes:   Functional Assessment Tool Used: Clinical judgement Functional Limitation: Mobility: Walking and moving around    Vena Austria 01/17/2014, 8:42 AM Durenda Hurt. Renaldo Fiddler, [place] [facility] Acute Rehab Services Pager [phone]

## 2014-01-17 NOTE — Progress Notes (Signed)
Occupational Therapy Evaluation Patient Details Name: Jeffrey Nelson MRN: [id number] DOB: Aug 29, 1934 Today's Date: 01/17/2014    History of Present Illness Patient is a 79 yo male admitted 01/16/14 now s/p Left L3-4 and L4-5 foraminotomies (N/A) Left L4-5 hemilaminectomy, left L3-4 partial hemilaminectomy   Clinical Impression   Completed all education regarding back precautions and ADL/functional mobility for ADL using AE and compensatory techniques. Discussed home safety and reducing risk of falls. Pt ready to D/C home with intermittent S when medically stable. Thank you.     Follow Up Recommendations  No OT follow up;Supervision - Intermittent    Equipment Recommendations  None recommended by OT    Recommendations for Other Services       Precautions / Restrictions Precautions Precautions: Fall;Back Precaution Booklet Issued: Yes (comment) Precaution Comments: Reviewed back precautions with patient and his wife. Restrictions Weight Bearing Restrictions: No      Mobility Bed Mobility Overal bed mobility: Needs Assistance Bed Mobility: Sidelying to Sit   Sidelying to sit: Supervision       General bed mobility comments: vc to log roll  Transfers Overall transfer level: Modified independent               General transfer comment: vc for back precautions initially. Pt able to return demonstrate with good carry over.    Balance Overall balance assessment: No apparent balance deficits (not formally assessed)                                          ADL Overall ADL's : Needs assistance/impaired                                     Functional mobility during ADLs: Supervision/safety (to follow back precautions) General ADL Comments: Pt requires overall S to follow back precatuions. Pt able to return demonstrate use of precautions during ADL without AE. disucssed home safety, use of reacher to assist as needed, use of showerseat  and compensatory techniques to limit the amount of bending arching or twisting.      Vision                     Perception     Praxis      Pertinent Vitals/Pain no apparent distress      Hand Dominance     Extremity/Trunk Assessment Upper Extremity Assessment Upper Extremity Assessment: Defer to OT evaluation   Lower Extremity Assessment Lower Extremity Assessment: Overall WFL for tasks assessed   Cervical / Trunk Assessment Cervical / Trunk Assessment: Normal   Communication Communication Communication: HOH   Cognition Arousal/Alertness: Awake/alert Behavior During Therapy: WFL for tasks assessed/performed Overall Cognitive Status: Within Functional Limits for tasks assessed                     General Comments       Exercises       Shoulder Instructions      Home Living Family/patient expects to be discharged to:: Private residence Living Arrangements: Spouse/significant other Available Help at Discharge: Family;Available 24 hours/day Type of Home: House Home Access: Stairs to enter [employer] of Steps: 3 Entrance Stairs-Rails: None Home Layout: One level     Bathroom Shower/Tub: Producer, television/film/video: Handicapped height Bathroom Accessibility: Yes  How Accessible: Accessible via walker Home Equipment: Walker - 2 wheels;Cane - single point;Bedside commode;Shower seat - built in          Prior Functioning/Environment Level of Independence: Independent with assistive device(s)        Comments: Used cane if needed due to pain    OT Diagnosis:     OT Problem List:     OT Treatment/Interventions:      OT Goals(Current goals can be found in the care plan section) Acute Rehab OT Goals Patient Stated Goal: go home  OT Frequency:     Barriers to D/C:            Co-evaluation              End of Session Nurse Communication: Mobility status;Other (comment) (ready for D/C)  Activity Tolerance:  Patient tolerated treatment well Patient left: in chair;with call bell/phone within reach   Time: 0805-0822 OT Time Calculation (min): 17 min Charges:  OT General Charges $OT Visit: 1 Procedure OT Evaluation $Initial OT Evaluation Tier I: 1 Procedure OT Treatments $Self Care/Home Management : 8-22 mins G-Codes: OT G-codes **NOT FOR INPATIENT CLASS** Functional Assessment Tool Used: clinical judgement Functional Limitation: Self care Self Care Current Status ([id number](G8987): At least 20 percent but less than 40 percent impaired, limited or restricted Self Care Goal Status ([id number](G8988): At least 1 percent but less than 20 percent impaired, limited or restricted Self Care Discharge Status [phone]([id number]): At least 1 percent but less than 20 percent impaired, limited or restricted  [facility] Centerilary S Dresean Beckel 01/17/2014, 8:35 AM   Luisa DagoHilary Nazanin Kinner, OTR/L  [phone] 01/17/2014

## 2014-01-20 ENCOUNTER — Encounter (HOSPITAL_COMMUNITY): Payer: Self-pay | Admitting: Specialist

## 2014-02-20 NOTE — OR Nursing (Signed)
Addendum to scope page 

## 2015-01-12 ENCOUNTER — Other Ambulatory Visit: Payer: Self-pay | Admitting: Neurosurgery

## 2015-01-12 DIAGNOSIS — M5412 Radiculopathy, cervical region: Secondary | ICD-10-CM

## 2015-01-13 ENCOUNTER — Other Ambulatory Visit: Payer: Self-pay | Admitting: Neurosurgery

## 2015-01-13 ENCOUNTER — Ambulatory Visit
Admission: RE | Admit: 2015-01-13 | Discharge: 2015-01-13 | Disposition: A | Discharge status: Home / Self Care | Payer: Medicare Other | Source: Ambulatory Visit | Attending: Neurosurgery | Admitting: Neurosurgery

## 2015-01-13 DIAGNOSIS — M5412 Radiculopathy, cervical region: Secondary | ICD-10-CM

## 2015-01-13 MED ORDER — DEXAMETHASONE SODIUM PHOSPHATE 4 MG/ML IJ SOLN
7.0000 mg | Freq: Once | INTRAMUSCULAR | Status: AC
  Administered 2015-01-13: 7 mg via INTRA_ARTICULAR

## 2015-01-13 MED ORDER — IOHEXOL 300 MG/ML  SOLN
1.0000 mL | Freq: Once | INTRAMUSCULAR | Status: AC | PRN
  Administered 2015-01-13: 1 mL via INTRA_ARTICULAR

## 2015-01-13 NOTE — Discharge Instructions (Signed)

## 2015-04-29 ENCOUNTER — Other Ambulatory Visit (HOSPITAL_COMMUNITY): Payer: Self-pay | Admitting: Specialist

## 2015-05-06 ENCOUNTER — Other Ambulatory Visit (HOSPITAL_COMMUNITY): Payer: Medicare Other

## 2015-05-07 ENCOUNTER — Encounter (HOSPITAL_COMMUNITY): Payer: Self-pay

## 2015-05-07 ENCOUNTER — Encounter (HOSPITAL_COMMUNITY)
Admission: RE | Admit: 2015-05-07 | Discharge: 2015-05-07 | Disposition: A | Discharge status: Home / Self Care | Payer: Medicare Other | Source: Ambulatory Visit | Attending: Specialist | Admitting: Specialist

## 2015-05-07 DIAGNOSIS — I10 Essential (primary) hypertension: Secondary | ICD-10-CM | POA: Diagnosis not present

## 2015-05-07 DIAGNOSIS — E119 Type 2 diabetes mellitus without complications: Secondary | ICD-10-CM | POA: Insufficient documentation

## 2015-05-07 DIAGNOSIS — M4802 Spinal stenosis, cervical region: Secondary | ICD-10-CM | POA: Diagnosis not present

## 2015-05-07 DIAGNOSIS — Z951 Presence of aortocoronary bypass graft: Secondary | ICD-10-CM | POA: Diagnosis not present

## 2015-05-07 DIAGNOSIS — Z9861 Coronary angioplasty status: Secondary | ICD-10-CM | POA: Diagnosis not present

## 2015-05-07 DIAGNOSIS — Z01812 Encounter for preprocedural laboratory examination: Secondary | ICD-10-CM | POA: Insufficient documentation

## 2015-05-07 DIAGNOSIS — H9190 Unspecified hearing loss, unspecified ear: Secondary | ICD-10-CM | POA: Diagnosis not present

## 2015-05-07 DIAGNOSIS — I2510 Atherosclerotic heart disease of native coronary artery without angina pectoris: Secondary | ICD-10-CM | POA: Insufficient documentation

## 2015-05-07 DIAGNOSIS — Z01818 Encounter for other preprocedural examination: Secondary | ICD-10-CM | POA: Insufficient documentation

## 2015-05-07 DIAGNOSIS — G5622 Lesion of ulnar nerve, left upper limb: Secondary | ICD-10-CM | POA: Insufficient documentation

## 2015-05-07 DIAGNOSIS — I252 Old myocardial infarction: Secondary | ICD-10-CM | POA: Insufficient documentation

## 2015-05-07 DIAGNOSIS — Z87891 Personal history of nicotine dependence: Secondary | ICD-10-CM | POA: Insufficient documentation

## 2015-05-07 HISTORY — DX: Unspecified hearing loss, unspecified ear: H91.90

## 2015-05-07 LAB — COMPREHENSIVE METABOLIC PANEL WITH GFR
ALT: 27 U/L (ref 17–63)
AST: 27 U/L (ref 15–41)
Albumin: 4.1 g/dL (ref 3.5–5.0)
Alkaline Phosphatase: 108 U/L (ref 38–126)
Anion gap: 7 (ref 5–15)
BUN: 15 mg/dL (ref 6–20)
CO2: 28 mmol/L (ref 22–32)
Calcium: 9.3 mg/dL (ref 8.9–10.3)
Chloride: 102 mmol/L (ref 101–111)
Creatinine, Ser: 0.9 mg/dL (ref 0.61–1.24)
GFR calc Af Amer: >60 mL/min
GFR calc non Af Amer: >60 mL/min
Glucose, Bld: 82 mg/dL (ref 65–99)
Potassium: 4.5 mmol/L (ref 3.5–5.1)
Sodium: 137 mmol/L (ref 135–145)
Total Bilirubin: 1.3 mg/dL — ABNORMAL HIGH (ref 0.3–1.2)
Total Protein: 7.1 g/dL (ref 6.5–8.1)

## 2015-05-07 LAB — SURGICAL PCR SCREEN
MRSA, PCR: NEGATIVE
Staphylococcus aureus: NEGATIVE

## 2015-05-07 LAB — CBC
HCT: 39.6 % (ref 39.0–52.0)
HEMOGLOBIN: 13 g/dL (ref 13.0–17.0)
MCH: 30.7 pg (ref 26.0–34.0)
MCHC: 32.8 g/dL (ref 30.0–36.0)
MCV: 93.6 fL (ref 78.0–100.0)
Platelets: 227 10*3/uL (ref 150–400)
RBC: 4.23 MIL/uL (ref 4.22–5.81)
RDW: 13.7 % (ref 11.5–15.5)
WBC: 8.9 10*3/uL (ref 4.0–10.5)

## 2015-05-07 LAB — URINALYSIS, ROUTINE W REFLEX MICROSCOPIC
Bilirubin Urine: NEGATIVE
Glucose, UA: NEGATIVE mg/dL
Hgb urine dipstick: NEGATIVE
Ketones, ur: NEGATIVE mg/dL
Leukocytes, UA: NEGATIVE
Nitrite: NEGATIVE
Protein, ur: NEGATIVE mg/dL
Specific Gravity, Urine: 1.02 (ref 1.005–1.030)
Urobilinogen, UA: 0.2 mg/dL (ref 0.0–1.0)
pH: 5 (ref 5.0–8.0)

## 2015-05-07 LAB — GLUCOSE, CAPILLARY: GLUCOSE-CAPILLARY: 101 mg/dL — AB (ref 65–99)

## 2015-05-07 NOTE — Progress Notes (Signed)
PCP is Oda Kilts  Cardiologist is Karleen Hampshire in [facility]  Patient denied having any acute cardiac or pulmonary issues  Patient informed Nurse that he does not check his blood glucose at home. CBG on arrival to PAT was 101, and patient stated he had consumed a bowl of cereal and two pieces of ham. Patient stated his last A1C was on 11/10/14 and it was 6.1  Patients wife at chair side during PAT visit

## 2015-05-07 NOTE — Pre-Procedure Instructions (Signed)
Jeffrey Nelson  05/07/2015     Your procedure is scheduled on : Monday May 10, 2015 at 7:30 AM.  Report to [facility] Admitting at 5:30 A.M.  Call this number if you have problems the morning of surgery: [phone]   Remember:  Do not eat food or drink liquids after midnight.  Take these medicines the morning of surgery with A SIP OF WATER : Amlodipine (Norvasc), Atenolol (Tenormin), Oxycodone if needed   Do NOT take any diabetic pills the morning of your surgery (NO metformin/glucophage)   Stop taking any vitamins, herbal medications, fish oil, CoQ10, Ibuprofen, Advil, Motrin, Aleve, etc  How to Manage Your Diabetes Before Surgery   Why is it important to control my blood sugar before and after surgery?   Improving blood sugar levels before and after surgery helps healing and can limit problems.  A way of improving blood sugar control is eating a healthy diet by:  - Eating less sugar and carbohydrates  - Increasing activity/exercise  - Talk with your doctor about reaching your blood sugar goals  High blood sugars (greater than 180 mg/dL) can raise your risk of infections and slow down your recovery so you will need to focus on controlling your diabetes during the weeks before surgery.  Make sure that the doctor who takes care of your diabetes knows about your planned surgery including the date and location.  How do I manage my blood sugars before surgery?   Check your blood sugar at least 4 times a day, 2 days before surgery to make sure that they are not too high or low.   Check your blood sugar the morning of your surgery when you wake up and every 2 hours until you get to the Short-Stay unit.  If your blood sugar is less than 70 mg/dL, you will need to treat for low blood sugar by:  Treat a low blood sugar (less than 70 mg/dL) with 1/2 cup of clear juice (cranberry or apple), 4 glucose tablets, OR glucose gel.  Recheck blood sugar in 15 minutes after  treatment (to make sure it is greater than 70 mg/dL).  If blood sugar is not greater than 70 mg/dL on re-check, call [phone] for further instructions.   Report your blood sugar to the Short-Stay nurse when you get to Short-Stay.  References:  [facility], 2007 "How to Manage your Diabetes Before and After Surgery".  What do I do about my diabetes medications?   Do not take oral diabetes medicines (pills) the morning of surgery.    Do not wear jewelry.  Do not wear lotions, powders, or cologne.    Men may shave face and neck.  Do not bring valuables to the hospital.  [facility] is not responsible for any belongings or valuables.  Contacts, dentures or bridgework may not be worn into surgery.  Leave your suitcase in the car.  After surgery it may be brought to your room.  For patients admitted to the hospital, discharge time will be determined by your treatment team.  Patients discharged the day of surgery will not be allowed to drive home.   Name and phone number of your driver:    Special instructions:  Shower using CHG soap the night before and the morning of your surgery  Please read over the following fact sheets that you were given. Pain Booklet, Coughing and Deep Breathing, MRSA Information and Surgical Site Infection Prevention

## 2015-05-08 LAB — HEMOGLOBIN A1C
HEMOGLOBIN A1C: 6.7 % — AB (ref 4.8–5.6)
Mean Plasma Glucose: 146 mg/dL

## 2015-05-10 NOTE — Progress Notes (Signed)
Anesthesia Chart Review:  Pt is 80 year old male scheduled for C3-4 ACDF, L ulnar nerve decompression at the elbow on 05/14/2015 with Dr. Otelia Sergeant.   Cardiologist is Dr. Karleen Hampshire at [facility] in [facility]. PCP is Dr. Pricilla Holm in [place].   PMH includes: CAD/MI (s/p PTCA to CX, LAD, RCA 1990's; s/p emergent CABG (LIMA to LAD, SVG to PDA, SVG to DIAG, SVG to OM) 01/09/00) following attempted PCI to RCA for unstable angina complicated by acute dissection of the RCA), HTN, DM. Hard of hearing. Former smoker. BMI 28. S/p L3-5 foraminotomies 01/16/14.   Anesthesia history includes: extremely claustrophobic, pt reports "sometimes I wake up crazy".   Preoperative labs reviewed.  HgbA1c 6.7, glucose 82.   EKG 04/27/2015: Sinus bradycardia (56 bpm). Incomplete RBBB.   Stress echo on 03/29/2011 (see correspondence in media tab dated 01/18/14): Functional capacity is fair for age/sez - 7.1 METS on the 2 minute Bruce protocol; normal resting biventricular function (EF) with no resting segmental abnormality; no clinical or echocardiographic ischemia (induced wall motion abnormality; Negative stress echocardiogram; patient did have mild chest pain at peak of exercise; resolved within a minute of rest. (A nuclear stress test was initially attempted on 12/20/10, but he was unable to complete study due to severe claustrophobia.)  By notes, pt had heart cath in 2007 "and no vessels requiring intervention were found".   Pt has cardiac clearance from Dr. Beverely Pace and medical clearance from Dr. Turner Daniels for surgery on paper chart.    If no changes, I anticipate pt can proceed with surgery as scheduled.   Rica Mast, FNP-BC [facility]/Anesthesiology Phone: [phone] 05/10/2015 3:23 PM

## 2015-05-13 NOTE — H&P (Signed)
Jeffrey Nelson is an 80 y.o. male.   CHIEF COMPLAINT:  Neck pain, severe with radiation into his shoulders, left side greater than right.  Numbness and paresthesias to the left forearm and left ulnar 2 digits.   HISTORY OF PRESENT ILLNESS:  Patient is a 80 year old male who I have seen in the past with problems of lumbar degenerative disk disease, spondylosis with foraminal entrapment left side, lower 2 segments.  He has had some problems with his neck that have been ongoing now for about 10 months.  He has been seen at [facility] and by neurosurgeons in [facility].  In the past he had undergone surgery by Dr. Stasia Cavalier of [facility] and [facility].  He has since been seen by neurosurgeons and evaluated.  He was referred for facet blocks at [facility] 01/13/2015 and had facet blocks then performed bilateral C3-4.  He relates that those injections performed did not provide him with any significant relief of pain.  MRI scan was done and the results of the study were reviewed with him previously.  These show significant area of cervical stenosis at C3-4 with loss of a normal CSF ringing about the cord and narrowing of the canal at this segment.  There is anterolisthesis at this segment of 2-3 mm.  Degree of cervical stenosis judged to be moderate by radiologists with disk osteophyte complex posteriorly in combination with anterolisthesis, moderate foraminal narrowing worse on the left compared with the right.  Uncovertebral hypertrophy is evident with moderate foraminal narrowing bilaterally at this level and also at C4-5.  Moderate left and mild right foraminal narrowing at C5-6.  Mild to moderate foraminal narrowing bilaterally at C6-7.  He has also undergone left-sided EMG and nerve conduction study of the upper extremity and this study has returned showing no signs of cervical radiculopathy, but findings of left-sided ulnar nerve compression at the  elbow.  This is in keeping with the ulnar symptoms on the left hand.  He has been seen by the neurosurgeons again in [facility] and recommended that he consider further injection treatment of this facet joint and his cervical spine, then presented to our office indicating that he could not wait to have intervention by the neurosurgeons and wanted to proceed then with intervention.  His daughter is with him here today for evaluation.      Past Medical History  Diagnosis Date  . Coronary artery disease   . Anginal pain     occ  . Hypertension   . Bronchitis     hx  . Diabetes mellitus without complication   . History of kidney stones   . Arthritis   . Complication of anesthesia     extremely claustrophobic; pt stated "sometimes I wake up crazy"  . Myocardial infarction 2001    X 3  . HOH (hard of hearing)     wearing hearing aids    Past Surgical History  Procedure Laterality Date  . Back surgery    . Lumbar laminectomy/decompression microdiscectomy N/A 01/16/2014    Procedure: Left L3-4 and L4-5 foraminotomies;  Surgeon: Kerrin Champagne, MD;  Location: [facility] OR;  Service: Orthopedics;  Laterality: N/A;  . Coronary artery bypass graft  01  . Eye surgery Bilateral 13    cataracts  . Joint replacement Right 12    knee  . Colonoscopy    . Cardiac catheterization      X 13  . Coronary angioplasty  No family history on file. Social History:  reports that he quit smoking about 58 years ago. He does not have any smokeless tobacco history on file. He reports that he does not drink alcohol or use illicit drugs.  Allergies:  Allergies  Allergen Reactions  . Ezetimibe Other (See Comments)    Cramps  . Morphine Other (See Comments)    Confusion  . Morphine And Related Other (See Comments)    "MAKES ME CRAZY"  . Statins Other (See Comments)    Cramps    No prescriptions prior to admission    No results found for this or any previous visit (from the past 48 hour(s)). No  results found.  Review of Systems  Constitutional: Negative.   HENT: Negative.   Eyes: Negative.   Respiratory: Negative.   Cardiovascular: Negative.   Gastrointestinal: Negative.   Genitourinary: Negative.   Skin: Negative.     There were no vitals taken for this visit. Physical Exam  Constitutional: He is oriented to person, place, and time.  HENT:  Head: Normocephalic and atraumatic.  Eyes: Pupils are equal, round, and reactive to light.  Cardiovascular: Normal rate.   Respiratory: No respiratory distress.  GI: He exhibits no distension.  Neurological: He is alert and oriented to person, place, and time.  Skin: Skin is warm and dry.  Psychiatric: He has a normal mood and affect.    PHYSICAL EXAMINATION:  He is 80 years of age, he is awake, alert, and oriented x4, hard of hearing.  He relates that his neck is killing him with constant pain now over the posterior aspect of his mid and upper cervical spine.  He has radiation into his left ulnar digits.  He does have the results of studies with him.  He does have the MRI scan of his cervical spine and we were able to also obtain the image on line from [facility].  The EMG and nerve conduction studies are reviewed with Alinda Money.  Aryn's exam shows severe limitations in lateral bending and rotation of the cervical spine both left and right side secondary to discomfort.  He had inability to extend the neck more than about 15 or 20 degrees extension beyond neutral with severe neck pain, pain into his shoulders, pain down the back of his spine posteriorly.  This is Lhermitte phenomenon.  His Hoffman sign is negative bilaterally.  He has discomfort with range of motion of his shoulders.  He has pain radiating into the left upper scapular angle and along the left supraspinatus muscle area.  Tender along the left cervicooccipital junction to about the C5 level.  Positive Spurling sign with extension of the neck and lateral rotation to the  left causing pain into his scapula on the left side and posterior occiput.  Decreased sensation left ulnar 2 digits.  No clawing of digits noted and abduction strength is only minimally weak, 5-/5.  Positive Tinel sign left ulnar nerve at the elbow.  Positive hyperflexion test of the left elbow today at about 10 seconds.  He has limitations in lateral bending and rotation to the left side by about 50%, the right side about 20-25%.   RADIOGRAPHS/TEST:  Went over this patient's radiographs of the cervical spine and it shows degenerative disk narrowing at the 3 mid cervical segments, C4-5, C5-6, C6-7 and anterolisthesis of about 3 mm at C3-4.  Severe arthrosis changes in the upper 3 cervical segments C2-3, C3-4 and C4-5.    ASSESSMENT/DIAGNOSIS:  I discussed  with Artemis the fact that he has what appears to be cord compression at C3-4 with cord flattening noted.  This can present with severe central  neck pain and pain into his shoulders alone without a great deal of motor deficit and clinically he does not have a great deal of motor deficit.  In addition, the EMGs and nerve conduction studies have demonstrated a problem of left-sided cubital tunnel syndrome.  PLAN:  I explained to Brenin that left-sided ulnar nerve decompression at the elbow would probably relieve the pain into his left arm, but would not necessarily relieve pain in his neck or radiation into his shoulders.  He has had longstanding arthritis changes in his neck, it is only more recently that the pain seems to be worsening and I believe that probably relates to the increasing cervical stenosis apparent at the C3-4 level.  He had previous facet blocks that were recommended and it appears as though the neurosurgeons were considering even further nerve blocks on the left side of his cervical spine.  These would probably help [place] in trying to discern if his facets were the source of discomfort.  The most obvious concern would be the C3-4 facet, but his  studies thus far did not really confirm these as being the source of discomfort, in that he failed the previous facet block in May of this year.  We cannot perform blocks of his nerves using cortisone and the pain condition that he has is most concerning for cervical stenosis.  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  I discussed with Ayiden the risks and benefits of undergoing intervention.  My recommendation would be for an anterior procedure at the C3-4 level with partial vertebrectomy of the C4 vertebra, decompression of his neuroforamen with fusion at this level, bone grafting, the use of plates and screws bridging the C3 to C4 levels.  If he persists with discomfort, then he would require foraminotomies to be done along the left side.  Before that could be done we would have to have further information about where he had previous surgeries by Dr. Nadene Rubins in the past to be sure that we do not violate more than 2 segments on a single side with foraminotomies.  However, the fact that he has had ongoing spondylosis in his cervical spine, the fact that EMGs are unremarkable, which I would expect for a C3-4 condition, all seem to add up to C3-4 as being the major issue here.  It is bilateral.  I would recommend that we go ahead and proceed then with left-sided ulnar neurolysis at the elbow and anterior cervical discectomy and fusion at C3-4 with a resection of the superior portion of the C4 vertebral body in order to decompress the canal at this level.  We will go ahead and schedule the patient for this surgery.  The risks of surgery include risk of infection, 1 in 200, perhaps as much as 1 in 100 considering he has borderline diabetes.  Risk of bleeding which is minimal here both with the elbow and the neck.  Risk to the nerves themselves and there is some risk of concern regarding the cervical cord and the nerve roots, but this risk taken into account is about 1 in 10,000.   We will go ahead and schedule him for intervention and plan to do the surgery in the same setting.  We will plan to keep him in the hospital for 1 day, soft  dressing for the left elbow.  OWENS,Roshni Burbano M 05/13/2015, 3:27 PM

## 2015-05-13 NOTE — Progress Notes (Signed)
Left message on pt's home # informing him of arrival time of 10:30 AM for tomorrow.

## 2015-05-14 ENCOUNTER — Encounter (HOSPITAL_COMMUNITY)
Admission: RE | Disposition: A | Discharge status: Home / Self Care | Payer: Self-pay | Source: Ambulatory Visit | Attending: Specialist

## 2015-05-14 ENCOUNTER — Ambulatory Visit (HOSPITAL_COMMUNITY): Payer: Medicare Other | Admitting: Emergency Medicine

## 2015-05-14 ENCOUNTER — Ambulatory Visit (HOSPITAL_COMMUNITY): Payer: Medicare Other | Admitting: Anesthesiology

## 2015-05-14 ENCOUNTER — Observation Stay (HOSPITAL_COMMUNITY)
Admission: RE | Admit: 2015-05-14 | Discharge: 2015-05-15 | Disposition: A | Discharge status: Home / Self Care | Payer: Medicare Other | Source: Ambulatory Visit | Attending: Specialist | Admitting: Specialist

## 2015-05-14 ENCOUNTER — Encounter (HOSPITAL_COMMUNITY): Payer: Self-pay

## 2015-05-14 ENCOUNTER — Ambulatory Visit (HOSPITAL_COMMUNITY): Payer: Medicare Other

## 2015-05-14 DIAGNOSIS — Z87891 Personal history of nicotine dependence: Secondary | ICD-10-CM | POA: Diagnosis not present

## 2015-05-14 DIAGNOSIS — I2510 Atherosclerotic heart disease of native coronary artery without angina pectoris: Secondary | ICD-10-CM | POA: Diagnosis not present

## 2015-05-14 DIAGNOSIS — G992 Myelopathy in diseases classified elsewhere: Secondary | ICD-10-CM | POA: Diagnosis present

## 2015-05-14 DIAGNOSIS — I10 Essential (primary) hypertension: Secondary | ICD-10-CM | POA: Diagnosis not present

## 2015-05-14 DIAGNOSIS — I252 Old myocardial infarction: Secondary | ICD-10-CM | POA: Insufficient documentation

## 2015-05-14 DIAGNOSIS — Z955 Presence of coronary angioplasty implant and graft: Secondary | ICD-10-CM | POA: Insufficient documentation

## 2015-05-14 DIAGNOSIS — D62 Acute posthemorrhagic anemia: Secondary | ICD-10-CM | POA: Diagnosis not present

## 2015-05-14 DIAGNOSIS — G5622 Lesion of ulnar nerve, left upper limb: Secondary | ICD-10-CM | POA: Insufficient documentation

## 2015-05-14 DIAGNOSIS — M4802 Spinal stenosis, cervical region: Secondary | ICD-10-CM | POA: Diagnosis present

## 2015-05-14 DIAGNOSIS — E119 Type 2 diabetes mellitus without complications: Secondary | ICD-10-CM | POA: Diagnosis not present

## 2015-05-14 DIAGNOSIS — Z419 Encounter for procedure for purposes other than remedying health state, unspecified: Secondary | ICD-10-CM

## 2015-05-14 HISTORY — PX: ANTERIOR CERVICAL DECOMP/DISCECTOMY FUSION: SHX1161

## 2015-05-14 HISTORY — PX: ULNAR NERVE TRANSPOSITION: SHX2595

## 2015-05-14 LAB — GLUCOSE, CAPILLARY
GLUCOSE-CAPILLARY: 126 mg/dL — AB (ref 65–99)
Glucose-Capillary: 104 mg/dL — ABNORMAL HIGH (ref 65–99)

## 2015-05-14 SURGERY — ANTERIOR CERVICAL DECOMPRESSION/DISCECTOMY FUSION 1 LEVEL
Anesthesia: General | Site: Neck

## 2015-05-14 MED ORDER — OMEGA-3-ACID ETHYL ESTERS 1 G PO CAPS
1.0000 g | ORAL_CAPSULE | Freq: Every day | ORAL | Status: DC
  Filled 2015-05-14: qty 1

## 2015-05-14 MED ORDER — PROPOFOL 10 MG/ML IV BOLUS
INTRAVENOUS | Status: DC | PRN
  Administered 2015-05-14: 140 mg via INTRAVENOUS
  Administered 2015-05-14: 30 mg via INTRAVENOUS

## 2015-05-14 MED ORDER — MIDAZOLAM HCL 2 MG/2ML IJ SOLN
INTRAMUSCULAR | Status: AC
  Filled 2015-05-14: qty 4

## 2015-05-14 MED ORDER — THROMBIN 20000 UNITS EX KIT
PACK | CUTANEOUS | Status: DC | PRN
  Administered 2015-05-14: 14:00:00 via TOPICAL

## 2015-05-14 MED ORDER — CYCLOBENZAPRINE HCL 10 MG PO TABS: 5.0000 mg | ORAL_TABLET | Freq: Three times a day (TID) | ORAL | Status: DC | PRN

## 2015-05-14 MED ORDER — ADULT MULTIVITAMIN W/MINERALS CH
1.0000 | ORAL_TABLET | Freq: Every day | ORAL | Status: DC
  Filled 2015-05-14: qty 1

## 2015-05-14 MED ORDER — DOCUSATE SODIUM 100 MG PO CAPS
100.0000 mg | ORAL_CAPSULE | Freq: Two times a day (BID) | ORAL | Status: DC
  Administered 2015-05-14: 100 mg via ORAL
  Filled 2015-05-14: qty 1

## 2015-05-14 MED ORDER — LACTATED RINGERS IV SOLN: INTRAVENOUS | Status: DC

## 2015-05-14 MED ORDER — ONDANSETRON HCL 4 MG PO TABS: 4.0000 mg | ORAL_TABLET | Freq: Four times a day (QID) | ORAL | Status: DC | PRN

## 2015-05-14 MED ORDER — POLYETHYLENE GLYCOL 3350 17 G PO PACK: 17.0000 g | PACK | Freq: Every day | ORAL | Status: DC | PRN

## 2015-05-14 MED ORDER — SODIUM CHLORIDE 0.9 % IJ SOLN: 3.0000 mL | Freq: Two times a day (BID) | INTRAMUSCULAR | Status: DC

## 2015-05-14 MED ORDER — MENTHOL 3 MG MT LOZG: 1.0000 | LOZENGE | OROMUCOSAL | Status: DC | PRN

## 2015-05-14 MED ORDER — MIDAZOLAM HCL 5 MG/5ML IJ SOLN
INTRAMUSCULAR | Status: DC | PRN
  Administered 2015-05-14: 2 mg via INTRAVENOUS

## 2015-05-14 MED ORDER — NEOSTIGMINE METHYLSULFATE 10 MG/10ML IV SOLN
INTRAVENOUS | Status: DC | PRN
  Administered 2015-05-14: 5 mg via INTRAVENOUS

## 2015-05-14 MED ORDER — HYDROMORPHONE HCL 1 MG/ML IJ SOLN: 1.0000 mg | INTRAMUSCULAR | Status: DC | PRN

## 2015-05-14 MED ORDER — ACETAMINOPHEN 325 MG PO TABS: 650.0000 mg | ORAL_TABLET | Freq: Four times a day (QID) | ORAL | Status: DC | PRN

## 2015-05-14 MED ORDER — EPHEDRINE SULFATE 50 MG/ML IJ SOLN
INTRAMUSCULAR | Status: DC | PRN
  Administered 2015-05-14 (×3): 10 mg via INTRAVENOUS

## 2015-05-14 MED ORDER — PROMETHAZINE HCL 25 MG/ML IJ SOLN: 6.2500 mg | INTRAMUSCULAR | Status: DC | PRN

## 2015-05-14 MED ORDER — OXYCODONE HCL ER 10 MG PO T12A
10.0000 mg | EXTENDED_RELEASE_TABLET | Freq: Two times a day (BID) | ORAL | Status: DC
  Administered 2015-05-14: 10 mg via ORAL
  Filled 2015-05-14: qty 1

## 2015-05-14 MED ORDER — BUPIVACAINE LIPOSOME 1.3 % IJ SUSP
INTRAMUSCULAR | Status: DC | PRN
  Administered 2015-05-14: 6 mL
  Administered 2015-05-14: 5 mL

## 2015-05-14 MED ORDER — ACETAMINOPHEN 650 MG RE SUPP: 650.0000 mg | Freq: Four times a day (QID) | RECTAL | Status: DC | PRN

## 2015-05-14 MED ORDER — CHLORHEXIDINE GLUCONATE 4 % EX LIQD: 60.0000 mL | Freq: Once | CUTANEOUS | Status: DC

## 2015-05-14 MED ORDER — KETOROLAC TROMETHAMINE 30 MG/ML IJ SOLN
30.0000 mg | Freq: Four times a day (QID) | INTRAMUSCULAR | Status: DC | PRN
  Administered 2015-05-14: 30 mg via INTRAVENOUS
  Filled 2015-05-14: qty 1

## 2015-05-14 MED ORDER — CEFAZOLIN SODIUM-DEXTROSE 2-3 GM-% IV SOLR
2.0000 g | INTRAVENOUS | Status: AC
  Administered 2015-05-14: 2 g via INTRAVENOUS
  Filled 2015-05-14: qty 50

## 2015-05-14 MED ORDER — LIDOCAINE HCL (PF) 1 % IJ SOLN
INTRAMUSCULAR | Status: AC
  Filled 2015-05-14: qty 30

## 2015-05-14 MED ORDER — THROMBIN 20000 UNITS EX SOLR
CUTANEOUS | Status: AC
  Filled 2015-05-14: qty 20000

## 2015-05-14 MED ORDER — FENTANYL CITRATE (PF) 250 MCG/5ML IJ SOLN
INTRAMUSCULAR | Status: AC
Start: 2015-05-14 — End: 2015-05-14
  Filled 2015-05-14: qty 5

## 2015-05-14 MED ORDER — SODIUM CHLORIDE 0.9 % IV SOLN: 250.0000 mL | INTRAVENOUS | Status: DC

## 2015-05-14 MED ORDER — HYDROMORPHONE HCL 1 MG/ML IJ SOLN: 0.5000 mg | INTRAMUSCULAR | Status: DC | PRN

## 2015-05-14 MED ORDER — AMLODIPINE BESYLATE 5 MG PO TABS: 5.0000 mg | ORAL_TABLET | Freq: Every day | ORAL | Status: DC

## 2015-05-14 MED ORDER — ATENOLOL 50 MG PO TABS: 50.0000 mg | ORAL_TABLET | Freq: Every day | ORAL | Status: DC

## 2015-05-14 MED ORDER — ASPIRIN EC 81 MG PO TBEC: 81.0000 mg | DELAYED_RELEASE_TABLET | Freq: Every day | ORAL | Status: DC

## 2015-05-14 MED ORDER — BUPIVACAINE-EPINEPHRINE (PF) 0.5% -1:200000 IJ SOLN
INTRAMUSCULAR | Status: AC
Start: 2015-05-14 — End: 2015-05-14
  Filled 2015-05-14: qty 30

## 2015-05-14 MED ORDER — BUPIVACAINE HCL 0.5 % IJ SOLN
INTRAMUSCULAR | Status: DC | PRN
  Administered 2015-05-14: 10 mL

## 2015-05-14 MED ORDER — ONDANSETRON HCL 4 MG/2ML IJ SOLN: 4.0000 mg | Freq: Four times a day (QID) | INTRAMUSCULAR | Status: DC | PRN

## 2015-05-14 MED ORDER — CYANOCOBALAMIN 250 MCG PO TABS
250.0000 ug | ORAL_TABLET | Freq: Every day | ORAL | Status: DC
  Filled 2015-05-14: qty 1

## 2015-05-14 MED ORDER — OXYCODONE-ACETAMINOPHEN 10-325 MG PO TABS: 1.0000 | ORAL_TABLET | ORAL | Status: DC | PRN

## 2015-05-14 MED ORDER — CEFAZOLIN SODIUM-DEXTROSE 2-3 GM-% IV SOLR
2.0000 g | Freq: Four times a day (QID) | INTRAVENOUS | Status: DC
  Administered 2015-05-14 – 2015-05-15 (×2): 2 g via INTRAVENOUS
  Filled 2015-05-14 (×3): qty 50

## 2015-05-14 MED ORDER — SUCCINYLCHOLINE CHLORIDE 20 MG/ML IJ SOLN
INTRAMUSCULAR | Status: DC | PRN
  Administered 2015-05-14: 100 mg via INTRAVENOUS

## 2015-05-14 MED ORDER — BISACODYL 10 MG RE SUPP: 10.0000 mg | Freq: Every day | RECTAL | Status: DC | PRN

## 2015-05-14 MED ORDER — OXYCODONE HCL 5 MG PO TABS: 5.0000 mg | ORAL_TABLET | ORAL | Status: DC | PRN

## 2015-05-14 MED ORDER — GLYCOPYRROLATE 0.2 MG/ML IJ SOLN
INTRAMUSCULAR | Status: DC | PRN
  Administered 2015-05-14: 0.2 mg via INTRAVENOUS
  Administered 2015-05-14: .8 mg via INTRAVENOUS

## 2015-05-14 MED ORDER — ROCURONIUM BROMIDE 100 MG/10ML IV SOLN
INTRAVENOUS | Status: DC | PRN
  Administered 2015-05-14: 50 mg via INTRAVENOUS
  Administered 2015-05-14: 20 mg via INTRAVENOUS

## 2015-05-14 MED ORDER — ZOLPIDEM TARTRATE 5 MG PO TABS
10.0000 mg | ORAL_TABLET | Freq: Every evening | ORAL | Status: DC | PRN
  Administered 2015-05-14: 10 mg via ORAL
  Filled 2015-05-14: qty 2

## 2015-05-14 MED ORDER — COQ10 200 MG PO CAPS: ORAL_CAPSULE | Freq: Every day | ORAL | Status: DC

## 2015-05-14 MED ORDER — HYDROMORPHONE HCL 1 MG/ML IJ SOLN
INTRAMUSCULAR | Status: AC
  Administered 2015-05-14: 0.5 mg via INTRAVENOUS
  Filled 2015-05-14: qty 1

## 2015-05-14 MED ORDER — ONDANSETRON HCL 4 MG/2ML IJ SOLN
INTRAMUSCULAR | Status: DC | PRN
  Administered 2015-05-14: 4 mg via INTRAVENOUS

## 2015-05-14 MED ORDER — BUPIVACAINE LIPOSOME 1.3 % IJ SUSP
20.0000 mL | Freq: Once | INTRAMUSCULAR | Status: DC
  Filled 2015-05-14: qty 20

## 2015-05-14 MED ORDER — SODIUM CHLORIDE 0.9 % IJ SOLN: 3.0000 mL | INTRAMUSCULAR | Status: DC | PRN

## 2015-05-14 MED ORDER — HYDROMORPHONE HCL 1 MG/ML IJ SOLN
0.2500 mg | INTRAMUSCULAR | Status: DC | PRN
  Administered 2015-05-14 (×2): 0.5 mg via INTRAVENOUS

## 2015-05-14 MED ORDER — FLEET ENEMA 7-19 GM/118ML RE ENEM: 1.0000 | ENEMA | Freq: Once | RECTAL | Status: DC | PRN

## 2015-05-14 MED ORDER — FENTANYL CITRATE (PF) 100 MCG/2ML IJ SOLN
INTRAMUSCULAR | Status: DC | PRN
  Administered 2015-05-14 (×5): 50 ug via INTRAVENOUS

## 2015-05-14 MED ORDER — LIDOCAINE HCL (CARDIAC) 20 MG/ML IV SOLN
INTRAVENOUS | Status: DC | PRN
  Administered 2015-05-14: 50 mg via INTRAVENOUS

## 2015-05-14 MED ORDER — KETOROLAC TROMETHAMINE 30 MG/ML IJ SOLN: 30.0000 mg | Freq: Four times a day (QID) | INTRAMUSCULAR | Status: DC

## 2015-05-14 MED ORDER — PHENOL 1.4 % MT LIQD: 1.0000 | OROMUCOSAL | Status: DC | PRN

## 2015-05-14 MED ORDER — PHENYLEPHRINE HCL 10 MG/ML IJ SOLN
INTRAMUSCULAR | Status: DC | PRN
  Administered 2015-05-14 (×2): 40 ug via INTRAVENOUS

## 2015-05-14 MED ORDER — PROPOFOL 10 MG/ML IV BOLUS
INTRAVENOUS | Status: AC
  Filled 2015-05-14: qty 20

## 2015-05-14 MED ORDER — 0.9 % SODIUM CHLORIDE (POUR BTL) OPTIME
TOPICAL | Status: DC | PRN
  Administered 2015-05-14: 1000 mL

## 2015-05-14 MED ORDER — OXYCODONE HCL 5 MG PO TABS
ORAL_TABLET | ORAL | Status: AC
  Filled 2015-05-14: qty 2

## 2015-05-14 MED ORDER — OXYCODONE HCL 5 MG PO TABS
5.0000 mg | ORAL_TABLET | ORAL | Status: DC | PRN
  Administered 2015-05-14: 10 mg via ORAL
  Filled 2015-05-14: qty 2

## 2015-05-14 MED ORDER — LACTATED RINGERS IV SOLN
INTRAVENOUS | Status: DC
  Administered 2015-05-14 (×3): via INTRAVENOUS

## 2015-05-14 SURGICAL SUPPLY — 100 items
ADH SKN CLS APL DERMABOND .7 (GAUZE/BANDAGES/DRESSINGS) ×4
BANDAGE ELASTIC 3 VELCRO ST LF (GAUZE/BANDAGES/DRESSINGS) ×4 IMPLANT
BANDAGE ELASTIC 4 VELCRO ST LF (GAUZE/BANDAGES/DRESSINGS) ×4 IMPLANT
BIT DRILL SRG 14X2.2XFLT CHK (BIT) ×2 IMPLANT
BIT DRL SRG 14X2.2XFLT CHK (BIT) ×2
BLADE SURG ROTATE 9660 (MISCELLANEOUS) IMPLANT
BNDG CMPR 9X4 STRL LF SNTH (GAUZE/BANDAGES/DRESSINGS) ×2
BNDG ESMARK 4X9 LF (GAUZE/BANDAGES/DRESSINGS) ×4 IMPLANT
BNDG GAUZE ELAST 4 BULKY (GAUZE/BANDAGES/DRESSINGS) ×4 IMPLANT
BONE MATRIX VIVIGEN 1CC (Bone Implant) ×4 IMPLANT
BUR MATCHSTICK NEURO 3.0 LAGG (BURR) IMPLANT
BUR RND FLUTED 2.5 (BURR) IMPLANT
BUR SABER RD CUTTING 3.0 (BURR) ×3 IMPLANT
BUR SABER RD CUTTING 3.0MM (BURR) ×1
CANISTER SUCTION 2500CC (MISCELLANEOUS) ×4 IMPLANT
CLOSURE STERI-STRIP 1/2X4 (GAUZE/BANDAGES/DRESSINGS) ×1
CLOSURE WOUND 1/2 X4 (GAUZE/BANDAGES/DRESSINGS) ×1
CLSR STERI-STRIP ANTIMIC 1/2X4 (GAUZE/BANDAGES/DRESSINGS) ×3 IMPLANT
COLLAR CERV LO CONTOUR FIRM DE (SOFTGOODS) IMPLANT
CORDS BIPOLAR (ELECTRODE) ×8 IMPLANT
COVER SURGICAL LIGHT HANDLE (MISCELLANEOUS) ×4 IMPLANT
CUFF TOURNIQUET SINGLE 18IN (TOURNIQUET CUFF) ×4 IMPLANT
CUFF TOURNIQUET SINGLE 24IN (TOURNIQUET CUFF) IMPLANT
DERMABOND ADVANCED (GAUZE/BANDAGES/DRESSINGS) ×4
DERMABOND ADVANCED .7 DNX12 (GAUZE/BANDAGES/DRESSINGS) ×4 IMPLANT
DRAIN TLS ROUND 10FR (DRAIN) ×4 IMPLANT
DRAPE C-ARM 42X72 X-RAY (DRAPES) IMPLANT
DRAPE MICROSCOPE LEICA (MISCELLANEOUS) ×4 IMPLANT
DRAPE POUCH INSTRU U-SHP 10X18 (DRAPES) ×4 IMPLANT
DRAPE PROXIMA HALF (DRAPES) ×4 IMPLANT
DRAPE SURG 17X23 STRL (DRAPES) ×12 IMPLANT
DRAPE U-SHAPE 47X51 STRL (DRAPES) ×4 IMPLANT
DRILL BIT SKYLINE 14MM (BIT) ×4
DRSG MEPILEX BORDER 4X4 (GAUZE/BANDAGES/DRESSINGS) IMPLANT
DURAPREP 26ML APPLICATOR (WOUND CARE) ×4 IMPLANT
DURAPREP 6ML APPLICATOR 50/CS (WOUND CARE) ×4 IMPLANT
ELECT BLADE 4.0 EZ CLEAN MEGAD (MISCELLANEOUS) ×4
ELECT COATED BLADE 2.86 ST (ELECTRODE) ×4 IMPLANT
ELECT REM PT RETURN 9FT ADLT (ELECTROSURGICAL) ×4
ELECTRODE BLDE 4.0 EZ CLN MEGD (MISCELLANEOUS) ×2 IMPLANT
ELECTRODE REM PT RTRN 9FT ADLT (ELECTROSURGICAL) ×2 IMPLANT
GAUZE SPONGE 4X4 12PLY STRL (GAUZE/BANDAGES/DRESSINGS) ×4 IMPLANT
GAUZE XEROFORM 1X8 LF (GAUZE/BANDAGES/DRESSINGS) ×4 IMPLANT
GLOVE BIOGEL PI IND STRL 8 (GLOVE) ×2 IMPLANT
GLOVE BIOGEL PI INDICATOR 8 (GLOVE) ×2
GLOVE ECLIPSE 9.0 STRL (GLOVE) ×4 IMPLANT
GLOVE ORTHO TXT STRL SZ7.5 (GLOVE) ×8 IMPLANT
GLOVE SURG 8.5 LATEX PF (GLOVE) ×4 IMPLANT
GLOVE SURG ORTHO 8.0 STRL STRW (GLOVE) ×4 IMPLANT
GOWN STRL REUS W/ TWL LRG LVL3 (GOWN DISPOSABLE) ×2 IMPLANT
GOWN STRL REUS W/TWL 2XL LVL3 (GOWN DISPOSABLE) ×8 IMPLANT
GOWN STRL REUS W/TWL LRG LVL3 (GOWN DISPOSABLE) ×4
HEAD HALTER (SOFTGOODS) ×4 IMPLANT
KIT BASIN OR (CUSTOM PROCEDURE TRAY) ×4 IMPLANT
KIT ROOM TURNOVER OR (KITS) ×4 IMPLANT
NEEDLE SPNL 20GX3.5 QUINCKE YW (NEEDLE) ×8 IMPLANT
NS IRRIG 1000ML POUR BTL (IV SOLUTION) ×4 IMPLANT
PACK ORTHO CERVICAL (CUSTOM PROCEDURE TRAY) ×4 IMPLANT
PACK ORTHO EXTREMITY (CUSTOM PROCEDURE TRAY) ×4 IMPLANT
PAD ARMBOARD 7.5X6 YLW CONV (MISCELLANEOUS) ×12 IMPLANT
PAD CAST 4YDX4 CTTN HI CHSV (CAST SUPPLIES) ×2 IMPLANT
PADDING CAST ABS 4INX4YD NS (CAST SUPPLIES) ×2
PADDING CAST ABS 6INX4YD NS (CAST SUPPLIES) ×2
PADDING CAST ABS COTTON 4X4 ST (CAST SUPPLIES) ×2 IMPLANT
PADDING CAST ABS COTTON 6X4 NS (CAST SUPPLIES) ×2 IMPLANT
PADDING CAST COTTON 4X4 STRL (CAST SUPPLIES) ×4
PATTIES SURGICAL .5 X.5 (GAUZE/BANDAGES/DRESSINGS) IMPLANT
PIN DISTRACTION 14 (PIN) ×8 IMPLANT
PIN DISTRACTION 14MM (PIN) ×8 IMPLANT
PIN TEMP SKYLINE THREADED (PIN) ×4 IMPLANT
PLATE ONE LEVEL SKYLINE 14MM (Plate) ×4 IMPLANT
SCREW VAR SELF TAP SKYLINE 14M (Screw) ×16 IMPLANT
SPACER ADV ACF 10MM (Bone Implant) ×4 IMPLANT
SPONGE GAUZE 4X4 12PLY STER LF (GAUZE/BANDAGES/DRESSINGS) ×4 IMPLANT
SPONGE INTESTINAL PEANUT (DISPOSABLE) IMPLANT
SPONGE LAP 4X18 X RAY DECT (DISPOSABLE) IMPLANT
SPONGE SURGIFOAM ABS GEL 100 (HEMOSTASIS) ×4 IMPLANT
STOCKINETTE IMPERVIOUS 9X36 MD (GAUZE/BANDAGES/DRESSINGS) ×4 IMPLANT
STRIP CLOSURE SKIN 1/2X4 (GAUZE/BANDAGES/DRESSINGS) ×3 IMPLANT
SUCTION FRAZIER TIP 10 FR DISP (SUCTIONS) ×4 IMPLANT
SURGIFLO W/THROMBIN 8M KIT (HEMOSTASIS) ×4 IMPLANT
SUT ETHILON 4 0 PS 2 18 (SUTURE) IMPLANT
SUT PROLENE 3 0 PS 2 (SUTURE) ×4 IMPLANT
SUT VIC AB 2-0 CT1 27 (SUTURE) ×12
SUT VIC AB 2-0 CT1 TAPERPNT 27 (SUTURE) ×6 IMPLANT
SUT VIC AB 3-0 FS2 27 (SUTURE) ×4 IMPLANT
SUT VIC AB 3-0 PS2 18 (SUTURE) ×4
SUT VIC AB 3-0 PS2 18XBRD (SUTURE) ×2 IMPLANT
SUT VIC AB 3-0 X1 27 (SUTURE) ×4 IMPLANT
SUT VICRYL 4-0 PS2 18IN ABS (SUTURE) ×4 IMPLANT
SYR 20CC LL (SYRINGE) ×4 IMPLANT
SYSTEM CHEST DRAIN TLS 7FR (DRAIN) IMPLANT
TOWEL OR 17X24 6PK STRL BLUE (TOWEL DISPOSABLE) ×4 IMPLANT
TOWEL OR 17X26 10 PK STRL BLUE (TOWEL DISPOSABLE) ×4 IMPLANT
TUBE CONNECTING 12'X1/4 (SUCTIONS) ×1
TUBE CONNECTING 12X1/4 (SUCTIONS) ×3 IMPLANT
TUBE CONNECTING 20'X1/4 (TUBING) ×1
TUBE CONNECTING 20X1/4 (TUBING) ×3 IMPLANT
UNDERPAD 30X30 INCONTINENT (UNDERPADS AND DIAPERS) ×4 IMPLANT
WATER STERILE IRR 1000ML POUR (IV SOLUTION) ×4 IMPLANT

## 2015-05-14 NOTE — Anesthesia Postprocedure Evaluation (Signed)
  Anesthesia Post-op Note  Patient: ALA CAPRI  Procedure(s) Performed: Procedure(s): C3-4  ANTERIOR CERVICAL DISCECTOMY AND FUSION WITH PLATE AND SCREWS, LOCAL AND ALLOGRAFT BONE GRAFT (N/A) LEFT ULNAR NERVE DECOMPRESSION AT THE ELBOW (Left)  Patient Location: PACU  Anesthesia Type:General  Level of Consciousness: awake, alert  and oriented  Airway and Oxygen Therapy: Patient Spontanous Breathing  Post-op Pain: mild  Post-op Assessment: Post-op Vital signs reviewed, Patient's Cardiovascular Status Stable, Respiratory Function Stable, Patent Airway and No signs of Nausea or vomiting LLE Motor Response: Purposeful movement, Responds to commands LLE Sensation: Full sensation RLE Motor Response: Purposeful movement, Responds to commands RLE Sensation: Full sensation      Post-op Vital Signs: Reviewed and stable  Last Vitals:  Filed Vitals:   05/14/15 1800  BP:   Pulse: 70  Temp:   Resp: 11    Complications: No apparent anesthesia complications

## 2015-05-14 NOTE — Op Note (Signed)
05/14/2015  5:59 PM  PATIENT:  Jeffrey Nelson  80 y.o. male  MRN: [id number]  OPERATIVE REPORT  PRE-OPERATIVE DIAGNOSIS:  C3-4 central cervical stenosis, left cubital tunnel syndrome  POST-OPERATIVE DIAGNOSIS:  C3-4 central cervical stenosis, left cubital tunnel syndrome  PROCEDURE:  Procedure(s): C3-4  ANTERIOR CERVICAL DISCECTOMY AND FUSION WITH PLATE AND SCREWS, LOCAL AND ALLOGRAFT BONE GRAFT LEFT ULNAR NERVE DECOMPRESSION AT THE ELBOW    SURGEON:  Jessy Oto, MD     ASSISTANT:  Benjiman Core, PA-C  (Present throughout the entire procedure and necessary for completion of procedure in a timely manner)     ANESTHESIA:  General,supplemented with local marcaine 0.5% 1:1 exparel 1.3% 10cc cervical skin and 5 cc left elbow.  Dr. Kalman Shan.    COMPLICATIONS:  None.     COMPONENTS:  Implant Name Type Inv. Item Serial No. Manufacturer Lot No. LRB No. Used  BONE MATRIX VIVIGEN 1CC - [id number] Bone Implant BONE MATRIX VIVIGEN 1CC [id number] [employer] [place] [employer]   1  SPACER ADV ACF 10MM - [id number] Bone Implant SPACER ADV ACF 10MM [id number] [employer] FNDN   1  SCREW VAR SELF TAP SKYLINE 62M - [id number] Screw SCREW VAR SELF TAP SKYLINE 62M  DEPUY SPINE   4  PLATE ONE LEVEL SKYLINE 62MM - [id number] Plate PLATE ONE LEVEL SKYLINE 62MM   DEPUY SPINE     1    PROCEDURE:The patient was met in the holding area, and the appropriate left C3-4 cervical level identified and marked with an "x" and my initials. The patient was then transported to OR and was placed on the operative table in a supine position head supported on the well padded [place] horseshoe. The patient was then placed under general anesthesia without difficulty intubated atraumaticly.  Cervical spine was positioned with a [place] horseshoe and 5 pound cervical halter traction. All pressure points well padded and semi-beach chair position. Arm holder both arms. Standard prep with DuraPrep solution  the anterior cervical spine chest. Draped in the usual manner. Iodine vi drape was used. Standard timeout protocol was carried out identifying the patient procedure side of the procedure and level. The skin the left neck was infiltrated with Marcaine with epinephrine total of 10cc. This at the level of expected C3-4 incision and also along a skin crease in line with the patients lines of Langer. Incision transverse at the C3-4 level and carried down to the level of the platysma. Then was carried down to the anterior aspect of the sternocleidomastoid muscle. The interval between the trachea and esophagus medially and the carotid sheath laterally was developed as a Metzenbaum scissors and blunt dissection exposing the anterior aspect of cervical spine at the C3-4 level. The prevertebral fascia anterior to the cervical was cauterized with bipolar and teased across the midline with a Art therapist. An 18-gauge spinal needle was placed with sheath intact allowing only a centimeter to extend into the C3-4 disc and observed on lateral C arm imaging at the C3-4 level. Handheld Cloward retraction of the soft tissues while identifying the level at C3-4 and also while removing a portion of the anterior aspect of the disc with15 blade scalpel and pituitary. Medial border of the longus collie muscles was carefully elevated bilaterally and self-retaining retractors were introduced the foot of the blade beneath the medial border of the longus colli muscles. Soft tissue overlying the anterior borders of the disc space at C3-4 level carefully debridement of soft tissue back to bony edges.  The anterior lip osteophytes were then resected using rongeur. This bone was preserved for later bone grafting purposes. The disc space was then first prepared using loupe magnification and headlight with resection of degenerative disc annulus anteriorly and posteriorly and cartilaginous endplates using micro-curettes pituitaries and a  high-speed bur. The operating room microscope was draped sterilely and brought into the field. Under the operating room microscope and posterior aspect of the disc was excised using micro-curettes pituitary rongeur and times per. Posterior lip osteophytes were drilled using a high-speed bur and a carefully resected with 1 and 2 mm Kerrison foraminotomy performed over both C4 nerve roots using 1 and 2 mm Kerrisons disc herniation material was noted centrally and into the right foramen some of which represented reflected portion of the patient's annulus into the neuroforamen most of the disc appeared to be calcified annulus and disc osteophyte. Following decompression of the spinal cord and both C4 nerve roots, irrigation was carried out. The endplates of the inferior aspect of C3 and superior aspect of C4 were carefully prepared using a high-speed bur to parallel. The disc space was then sounded utilized and a precontoured sounder for the transgraft implant. Surrounding was carried up to a 63m implant. 10 mm lordotic ACDF spacer was felt to provide best fit for the C3-4 disc space. The ACDF permanent lordotic allograft implant was then brought onto the field. It was then packed with local bone graft that had been harvested previously and 1 cc of vivigen. The implant was then carefully placed over the intervertebral disc space at C3-4 level. Care taken to ensure that no bone or soft tissue debris within the disc space that could be retropulsed with insertion of the cage and bone graft. The implant was then impacted into place with the head placed in longitudinal cervical traction. The implant was felt to be in excellent position alignment. Cervical distraction instrumentation was removed and the screw post holes coated with wax for hemostasis. Length of the plate was then chosen using plate applied to a from the edge of the lower cervical vertebral border of C4 the upper cervical border of C3. A 14 millimeter plate  was chosen. 14 mm screws were then placed into the C3 locking the plate in place. Additional 14 mm screws were then placed in the C4 level cervical traction had been released prior to screw placement. Intraoperative lateral radiograph demonstrated the plates and screws in good position The graft appeared in good position alignment.  This point irrigation was carried out at cervical incision site.The esophagus examined at the cervical level and found to be normal. Irrigation was again carried out there was no active bleeding present. A 7 french TLS drain placed over the left side of the cervical plate and anterior cervical spine exiting inferior to the incision and sewn in place with a 4-O Nylon suture.The incisions were then closed by approximating the deep subcutaneous layers the platysma layer with interrupted 3-0 Vicryl suture and the superficial fascia overlying the sternocleidomastoid muscle with interrupted 3-0 Vicryl sutures. The subcutaneous layers were approximated with interrupted 3-0 Vicryl sutures as were the superficial layers. The skin was closed with a running subcutaneous stitch of 4-0 Vicryl at the operative C3-4 transverse incision site. Skin was approximated with Dermabond. Mepilex bandage was applied. A soft cervical collar was then applied to the cervical traction released on the cervical spine and drapes were removed.      The left upper extremity was then prepped using sterile conditions with  duraprep from the fingertips to the left upper arm and draped using sterile technique.  Time-out procedure was called and correct.left upper extremity the was elevated exsanguinated with an Esmarch bandage and tourniquet inflated to 250 mm mercury. Using loope magnification and head lamp.  Attention then turned to the left elbow, with the elbow flexed and externally rotated an incision was made approximately 10 cm in length extending from proximal to the medial epicondyle in mind and with the  medial epicondyle and then extending to the proximal forearm. Incision through skin subcutaneous layers directly down to the superficial fascia overlying the medial triceps and overlying the medial epicondyle and the superficial fascia to the volar proximal compartment of the forearm. And the fascia along the medial aspect of the distal triceps was then incised using Metzenbaum scissors the ulnar nerve identified. The fascia was then incised extending proximally over the superficial aspect of the ulnar nerve and the nerve felt to be free. Small bands of fibrous tissue were released and this area and the nerve freed to the region of the arcade of Struthers. The nerve was then freed up circumferentially as proximal to the cubital tunnel. Releasing of the nerve with dissection and division of the fiberous tissue superficial to the nerve as it entered the cubital tunnel. The tissue superficial to the ulnar nerve was carefully freed and then divided using Metzenbaum scissors from proximal to distal the superficial portion of the flexor compartment and size. Tourniquet was released small bleeders along the distal bed of the ulnar were cauterized using bipolar cautery. Irrigation was carried out and the incision then closed using subcutaneous 3-0 Vicryl sutures and a running subcutaneous suture of 3-O vicryl. Dermabond then applied to the right elbow incision site. Well-padded dressing using Adaptic 4 x 4's ABDs fixed to the elbow with sterile webril.   A well padded soft dressing applied  to the left arm from mid metacarpal level to the upper arm. The patient reactivated and returned to the PACU in good condition.  All instruments and sponge counts were correct.    Benjiman Core PA-C perform the duties of assistant surgeon during this case. He was present from the beginning of the case to the end of the case assisting in transfer the patient from his stretcher to the OR table and back to the stretcher at the end of the  case. Assisted in careful suction of the anterior cervical discectomy site and delicate neural structures operating under the operating room microscope. He performed closure of the incision from the fascia to the skin applying the dressing.    Chrisie Jankovich E 05/14/2015, 5:59 PM

## 2015-05-14 NOTE — Transfer of Care (Signed)
Immediate Anesthesia Transfer of Care Note  Patient: Jeffrey Nelson  Procedure(s) Performed: Procedure(s): C3-4  ANTERIOR CERVICAL DISCECTOMY AND FUSION WITH PLATE AND SCREWS, LOCAL AND ALLOGRAFT BONE GRAFT (N/A) LEFT ULNAR NERVE DECOMPRESSION AT THE ELBOW (Left)  Patient Location: PACU  Anesthesia Type:General  Level of Consciousness: awake, alert  and oriented  Airway & Oxygen Therapy: Patient Spontanous Breathing and Patient connected to nasal cannula oxygen  Post-op Assessment: Report given to RN and Post -op Vital signs reviewed and stable  Post vital signs: Reviewed and stable  Last Vitals:  Filed Vitals:   05/14/15 1726  BP: 142/67  Pulse:   Temp: 36 C  Resp: 14    Complications: No apparent anesthesia complications

## 2015-05-14 NOTE — Anesthesia Preprocedure Evaluation (Signed)
Anesthesia Evaluation  Patient identified by MRN, date of birth, ID band Patient awake    Reviewed: Allergy & Precautions, NPO status , Patient's Chart, lab work & pertinent test results  Airway Mallampati: II  TM Distance: >3 FB Neck ROM: Limited    Dental no notable dental hx.    Pulmonary neg pulmonary ROS, former smoker,    Pulmonary exam normal breath sounds clear to auscultation       Cardiovascular hypertension, + CAD, + Past MI and + CABG  Normal cardiovascular exam Rhythm:Regular Rate:Normal     Neuro/Psych negative neurological ROS  negative psych ROS   GI/Hepatic negative GI ROS, Neg liver ROS,   Endo/Other  negative endocrine ROSdiabetes  Renal/GU negative Renal ROS  negative genitourinary   Musculoskeletal negative musculoskeletal ROS (+)   Abdominal   Peds negative pediatric ROS (+)  Hematology negative hematology ROS (+)   Anesthesia Other Findings   Reproductive/Obstetrics negative OB ROS                             Anesthesia Physical Anesthesia Plan  ASA: III  Anesthesia Plan: General   Post-op Pain Management:    Induction: Intravenous  Airway Management Planned: Oral ETT  Additional Equipment:   Intra-op Plan:   Post-operative Plan: Extubation in OR  Informed Consent: I have reviewed the patients History and Physical, chart, labs and discussed the procedure including the risks, benefits and alternatives for the proposed anesthesia with the patient or authorized representative who has indicated his/her understanding and acceptance.   Dental advisory given  Plan Discussed with: CRNA and Surgeon  Anesthesia Plan Comments:         Anesthesia Quick Evaluation

## 2015-05-14 NOTE — Interval H&P Note (Signed)
History and Physical Interval Note:  05/14/2015 12:48 PM  Jeffrey Nelson  has presented today for surgery, with the diagnosis of C3-4 central cervical stenosis, left cubital tunnel syndrome  The various methods of treatment have been discussed with the patient and family. After consideration of risks, benefits and other options for treatment, the patient has consented to  Procedure(s): C3-4  ANTERIOR CERVICAL DISCECTOMY AND FUSION WITH PLATE AND SCREWS, LOCAL AND ALLOGRAFT BONE GRAFT (N/A) LEFT ULNAR NERVE DECOMPRESSION AT THE ELBOW (Left) as a surgical intervention .  The patient's history has been reviewed, patient examined, no change in status, stable for surgery.  I have reviewed the patient's chart and labs.  Questions were answered to the patient's satisfaction.     NITKA,JAMES E

## 2015-05-14 NOTE — Anesthesia Procedure Notes (Signed)
Procedure Name: Intubation Date/Time: 05/14/2015 1:09 PM Performed by: Marylyn Ishihara Pre-anesthesia Checklist: Patient identified, Emergency Drugs available, Suction available, Patient being monitored and Timeout performed Patient Re-evaluated:Patient Re-evaluated prior to inductionOxygen Delivery Method: Circle system utilized Preoxygenation: Pre-oxygenation with 100% oxygen Intubation Type: IV induction Ventilation: Mask ventilation without difficulty Laryngoscope Size: Mac and 4 Grade View: Grade I Tube size: 7.5 mm Number of attempts: 1 Airway Equipment and Method: Stylet Placement Confirmation: ETT inserted through vocal cords under direct vision,  positive ETCO2 and breath sounds checked- equal and bilateral Secured at: 23 cm Tube secured with: Tape Dental Injury: Teeth and Oropharynx as per pre-operative assessment

## 2015-05-14 NOTE — Discharge Instructions (Signed)
° ° °  No lifting greater than 10 lbs. No overhead use of arms. Avoid bending,and twisting neck. Walk in house for first week them may start to get out slowly increasing distance up to one quarter mile by 3 weeks post op. Keep incision dry for 3 days, may then bathe and wet incision using a [place] collar when showering. Call if any fevers >101, chills, or increasing numbness or weakness or increased swelling or drainage.    Keep dressing dry. Elevated elbow above heart. Apply ice to outer side of elbow two hours on and one half hour off for 48 hours. May Apply ice at night and go to sleep with out changing. Be sure to keep ice off fingers to prevent frost bite.  Return to office in two weeks for incision check and to start exercises.

## 2015-05-14 NOTE — Brief Op Note (Signed)
05/14/2015  5:11 PM  PATIENT:  Jeffrey Nelson  80 y.o. male  PRE-OPERATIVE DIAGNOSIS:  C3-4 central cervical stenosis, left cubital tunnel syndrome  POST-OPERATIVE DIAGNOSIS:  C3-4 central cervical stenosis, left cubital tunnel syndrome  PROCEDURE:  Procedure(s): C3-4  ANTERIOR CERVICAL DISCECTOMY AND FUSION WITH PLATE AND SCREWS, LOCAL AND ALLOGRAFT BONE GRAFT (N/A) LEFT ULNAR NERVE DECOMPRESSION AT THE ELBOW (Left)  SURGEON:  Surgeon(s) and Role:    * Kerrin Champagne, MD - Primary  PHYSICIAN ASSISTANT:James Barry Dienes, PA-C   ANESTHESIA:   local and general  EBL:  Total I/O In: 2000 [I.V.:2000] Out: 150 [Blood:150]  BLOOD ADMINISTERED:none  DRAINS: (10 [place]) TLS Drain(s) to suction in the left anterior neck.   LOCAL MEDICATIONS USED:  MARCAINE 0.5% 1:1 Exparel 1.3% Amount: 10 ml left neck, 5 cc left elbow.  SPECIMEN:  No Specimen  DISPOSITION OF SPECIMEN:  N/A  COUNTS:  YES  TOURNIQUET:   Total Tourniquet Time Documented: Upper Arm (Left) - 17 minutes Total: Upper Arm (Left) - 17 minutes  EBL: < 75 cc  DICTATION: .Reubin Milan Dictation  PLAN OF CARE: Admit for overnight observation  PATIENT DISPOSITION:  PACU - hemodynamically stable.   Delay start of Pharmacological VTE agent (>24hrs) due to surgical blood loss or risk of bleeding: yes

## 2015-05-15 DIAGNOSIS — M4802 Spinal stenosis, cervical region: Secondary | ICD-10-CM | POA: Diagnosis not present

## 2015-05-15 MED ORDER — OXYCODONE HCL 5 MG PO TABS: 5.0000 mg | ORAL_TABLET | ORAL | Status: DC | PRN

## 2015-05-15 NOTE — Progress Notes (Signed)
   Subjective:  Patient reports pain as mild.    Objective:   VITALS:   Filed Vitals:   05/14/15 1815 05/14/15 1900 05/15/15 0032 05/15/15 0525  BP:  126/66 103/50 104/56  Pulse: 69 69 62 65  Temp: 97 F (36.1 C) 97.5 F (36.4 C) 98 F (36.7 C) 97.9 F (36.6 C)  TempSrc:   Oral Oral  Resp: Height:      Weight:      SpO2: 100% 100% 94% 91%    Neurologically intact Neurovascular intact Sensation intact distally Intact pulses distally   Lab Results  Component Value Date   WBC 8.9 05/07/2015   HGB 13.0 05/07/2015   HCT 39.6 05/07/2015   MCV 93.6 05/07/2015   PLT 227 05/07/2015     Assessment/Plan:  1 Day Post-Op   - Expected postop acute blood loss anemia - will monitor for symptoms - Up with PT/OT - DVT ppx - SCDs, ambulation - Pain control - Discharge planning - home today - TLS drain removed  Cheral Almas 05/15/2015, 7:50 AM [phone]

## 2015-05-15 NOTE — Progress Notes (Signed)
PT Cancellation Note  Patient Details Name: Jeffrey Nelson MRN: [id number] DOB: [birth year]   Cancelled Treatment:    Reason Eval/Treat Not Completed: PT screened, no needs identified, will sign off.  Pt up in room upon PT arrival and pt and pt's wife report he is at mod I level.  No PT needs identified.  Thank you for this order.  Michail Jewels PT, DPT [phone] Pager: [phone] 05/15/2015, 9:33 AM

## 2015-05-15 NOTE — Care Management Note (Signed)
Case Management Note  Patient Details  Name: Jeffrey Nelson MRN: [id number] Date of Birth: [birth year]  Subjective/Objective:                  C3-4 ANTERIOR CERVICAL DISCECTOMY AND FUSION WITH PLATE AND SCREWS, LOCAL AND ALLOGRAFT BONE GRAFT LEFT ULNAR NERVE DECOMPRESSION AT THE ELBOW  Action/Plan: Cm noted that patient was discharged and MD had previously entered [facility] PT/OT orders. CM called pt at home and spoke to pt and spouse @  [phone]. Cm advised that [facility] PT/OT was ordered and offered pt choice and pt and spouse declined the need for any [facility] services as per PT note and Pt, he was doing well. Spouse declined any DME needs for pt. NO further CM needs communicated.   Expected Discharge Date:  05/15/15               Expected Discharge Plan:  Home/Self Care  In-House Referral:     Discharge planning Services  CM Consult  Post Acute Care Choice:    Choice offered to:  Patient, Spouse  DME Arranged:    DME Agency:     HH Arranged:    HH Agency:     Status of Service:  Completed, signed off  Medicare Important Message Given:    Date Medicare IM Given:    Medicare IM give by:    Date Additional Medicare IM Given:    Additional Medicare Important Message give by:     If discussed at Long Length of Stay Meetings, dates discussed:    Additional Comments:  Darcel Smalling, RN 05/15/2015, 4:19 PM

## 2015-05-17 ENCOUNTER — Encounter (HOSPITAL_COMMUNITY): Payer: Self-pay | Admitting: Specialist

## 2015-05-17 NOTE — Discharge Summary (Signed)
Physician Discharge Summary      Patient ID: Jeffrey Nelson MRN: [id number] DOB/AGE: 26-Feb-1935 62 y.o.  Admit date: 05/14/2015 Discharge date: 05/15/2015  Admission Diagnoses:  Principal Problem:   Cubital tunnel syndrome on left Active Problems:   Spinal stenosis in cervical region   Stenosis of cervical spine with myelopathy   Discharge Diagnoses:  Same  Past Medical History  Diagnosis Date  . Coronary artery disease   . Anginal pain     occ  . Hypertension   . Bronchitis     hx  . Diabetes mellitus without complication   . History of kidney stones   . Arthritis   . Complication of anesthesia     extremely claustrophobic; pt stated "sometimes I wake up crazy"  . Myocardial infarction 2001    X 3  . HOH (hard of hearing)     wearing hearing aids    Surgeries: Procedure(s): C3-4  ANTERIOR CERVICAL DISCECTOMY AND FUSION WITH PLATE AND SCREWS, LOCAL AND ALLOGRAFT BONE GRAFT LEFT ULNAR NERVE DECOMPRESSION AT THE ELBOW on 05/14/2015   Consultants:    Discharged Condition: Improved  Hospital Course: Jeffrey Nelson is an 80 y.o. male who was admitted 05/14/2015 with a chief complaint of No chief complaint on file. , and found to have a diagnosis of Cubital tunnel syndrome on left.  He was brought to the operating room on 05/14/2015 and underwent the above named procedures.    He was given perioperative antibiotics:  Anti-infectives    Start     Dose/Rate Route Frequency Ordered Stop   05/14/15 2030  ceFAZolin (ANCEF) IVPB 2 g/50 mL premix  Status:  Discontinued     2 g 100 mL/hr over 30 Minutes Intravenous Every 6 hours 05/14/15 2018 05/15/15 1311   05/14/15 1050  ceFAZolin (ANCEF) IVPB 2 g/50 mL premix     2 g 100 mL/hr over 30 Minutes Intravenous On call to O.R. 05/14/15 1050 05/14/15 1257    He recovered uneventfully, was transferred to [facility] room 26. Able to stand and walk on the first day of  Surgery. Voided with out difficulty. TLS drain  discontinued on POD#1. That day he was alert and oriented x 4, taking And toleration po nourishment and medication. Left arm pain markedly improved. VSS, neurologic exam normal. Dressing changed and no drainage or  Unusual swelling noted. He was discharged home on POD#1.  He was given sequential compression devices and early ambulation for DVT prophylaxis.  He benefited maximally from their hospital stay and there were no complications.    Recent vital signs:  Filed Vitals:   05/15/15 0525  BP: 104/56  Pulse: 65  Temp: 97.9 F (36.6 C)  Resp: 16    Recent laboratory studies:  Results for orders placed or performed during the hospital encounter of 05/14/15  Glucose, capillary  Result Value Ref Range   Glucose-Capillary 126 (H) 65 - 99 mg/dL  Glucose, capillary  Result Value Ref Range   Glucose-Capillary 104 (H) 65 - 99 mg/dL   Comment 1 Notify RN     Discharge Medications:     Medication List    TAKE these medications        amLODipine 5 MG tablet  Commonly known as:  NORVASC  Take 5 mg by mouth daily.     aspirin EC 81 MG tablet  Take 81 mg by mouth daily.     atenolol 50 MG tablet  Commonly known as:  TENORMIN  Take 50 mg by mouth daily.     COQ10 PO  Take 1 tablet by mouth daily.     cyclobenzaprine 5 MG tablet  Commonly known as:  FLEXERIL  Take 5 mg by mouth every 8 (eight) hours as needed for muscle spasms.     Fish Oil 1000 MG Caps  Take 1,000 mg by mouth 1 day or 1 dose.     metFORMIN 500 MG tablet  Commonly known as:  GLUCOPHAGE  Take 500 mg by mouth daily with breakfast.     methocarbamol 500 MG tablet  Commonly known as:  ROBAXIN  Take 1 tablet (500 mg total) by mouth every 6 (six) hours as needed for muscle spasms (spasm).     multivitamin with minerals Tabs tablet  Take 1 tablet by mouth daily.     OVER THE COUNTER MEDICATION  every morning. metamucil     oxyCODONE 5 MG immediate release tablet  Commonly known as:  Oxy IR/ROXICODONE    Take 1-3 tablets (5-15 mg total) by mouth every 4 (four) hours as needed.     oxyCODONE-acetaminophen 10-325 MG per tablet  Commonly known as:  PERCOCET  Take 1 tablet by mouth every 4 (four) hours as needed for pain.     VITAMIN B-12 PO  Take 1 tablet by mouth daily.     zolpidem 10 MG tablet  Commonly known as:  AMBIEN  Take 10 mg by mouth at bedtime as needed for sleep.        Diagnostic Studies: Dg Cervical Spine 2-3 Views  05/14/2015   CLINICAL DATA:  Status post ACDF of C3-4  EXAM: CERVICAL SPINE - 2-3 VIEW  COMPARISON:  None  FINDINGS: Single lateral portable radiograph of the cervical spine shows a surgical probe within the C3-4 disc space.  IMPRESSION: 1. Surgical probe localizes the C3-4 disc space.   Electronically Signed   By: Signa Kell M.D.   On: 05/14/2015 15:41    Disposition: 01-Home or Self Care      Discharge Instructions    Call MD / Call 911    Complete by:  As directed   If you experience chest pain or shortness of breath, CALL 911 and be transported to the hospital emergency room.  If you develope a fever above 101 F, pus (white drainage) or increased drainage or redness at the wound, or calf pain, call your surgeon's office.     Call MD / Call 911    Complete by:  As directed   If you experience chest pain or shortness of breath, CALL 911 and be transported to the hospital emergency room.  If you develope a fever above 101.5 F, pus (white drainage) or increased drainage or redness at the wound, or calf pain, call your surgeon's office.     Constipation Prevention    Complete by:  As directed   Drink plenty of fluids.  Prune juice may be helpful.  You may use a stool softener, such as Colace (over the counter) 100 mg twice a day.  Use MiraLax (over the counter) for constipation as needed.     Constipation Prevention    Complete by:  As directed   Drink plenty of fluids.  Prune juice may be helpful.  You may use a stool softener, such as Colace (over the  counter) 100 mg twice a day.  Use MiraLax (over the counter) for constipation as needed.     Diet - low sodium heart healthy  Complete by:  As directed      Diet Carb Modified    Complete by:  As directed      Diet general    Complete by:  As directed      Discharge instructions    Complete by:  As directed   No lifting greater than 10 lbs. No overhead use of arms. Avoid bending,and twisting neck. Walk in house for first week them may start to get out slowly increasing distance up to one mile by 3 weeks post op. Keep incision dry for 3 days, may then bathe and wet incision using a [place] collar when showering. Call if any fevers >101, chills, or increasing numbness or weakness or increased swelling or drainage.    Keep dressing dry. Elevated elbow above heart. Apply ice to outer side of elbow two hours on and one half hour off for 48 hours. May Apply ice at night and go to sleep with out changing. Be sure to keep ice off fingers to prevent frost bite.  Return to office in two weeks for incision check and to start exercises.     Driving restrictions    Complete by:  As directed   No driving for 4 weeks     Driving restrictions    Complete by:  As directed   No driving while taking narcotic pain meds.     Increase activity slowly as tolerated    Complete by:  As directed      Increase activity slowly as tolerated    Complete by:  As directed      Lifting restrictions    Complete by:  As directed   No lifting for 6 weeks           Follow-up Information    Follow up with NITKA,JAMES E, MD In 2 weeks.   Specialty:  Orthopedic Surgery   Contact information:   [address] [place] [address] [phone]        Signed: Kerrin Champagne 05/17/2015, 10:20 AM

## 2016-07-24 ENCOUNTER — Ambulatory Visit (INDEPENDENT_AMBULATORY_CARE_PROVIDER_SITE_OTHER): Payer: Self-pay | Admitting: Orthopaedic Surgery

## 2016-07-24 ENCOUNTER — Encounter (INDEPENDENT_AMBULATORY_CARE_PROVIDER_SITE_OTHER): Payer: Self-pay | Admitting: Orthopaedic Surgery

## 2016-07-24 ENCOUNTER — Ambulatory Visit (INDEPENDENT_AMBULATORY_CARE_PROVIDER_SITE_OTHER): Payer: Medicare HMO | Admitting: Orthopaedic Surgery

## 2016-07-24 ENCOUNTER — Ambulatory Visit (INDEPENDENT_AMBULATORY_CARE_PROVIDER_SITE_OTHER): Payer: Medicare HMO

## 2016-07-24 VITALS — BP 130/70 | HR 72 | Ht 75.0 in | Wt 225.0 lb

## 2016-07-24 DIAGNOSIS — M25562 Pain in left knee: Secondary | ICD-10-CM

## 2016-07-24 DIAGNOSIS — S8000XA Contusion of unspecified knee, initial encounter: Secondary | ICD-10-CM | POA: Insufficient documentation

## 2016-07-24 DIAGNOSIS — S8002XA Contusion of left knee, initial encounter: Secondary | ICD-10-CM

## 2016-07-24 MED ORDER — ZOLPIDEM TARTRATE 10 MG PO TABS: 10.0000 mg | ORAL_TABLET | Freq: Every evening | ORAL | 0 refills | Status: DC | PRN

## 2016-07-24 NOTE — Progress Notes (Signed)
Office Visit Note   Patient: Jeffrey Nelson           Date of Birth: 1935/05/02           MRN: [id number] Visit Date: 07/24/2016              Requested by: Oda Kilts, MD [address] [place], [place] [address] PCP: Oda Kilts, MD   Assessment & Plan: Visit Diagnoses:  1. Acute pain of left knee   2. Contusion of left knee, initial encounter   3.      Probable right foot second and third toe fractures without deformity. X-rays were deferred 4.  Fall with knee contusion Plan: Patient can take some Aspercreme and applied to his knee as needed. Patient states his only problem is sleeping at night when his knee is more sore and he has difficulty sleeping. He's been using one half of an Ambien tablet. He requests a prescription and gave him the prescription for 10 tablets one half tablet by mouth daily at bedtime when necessary which the computer list may not be covered by his insurance. Patient still has a few Norco tablets from previous surgeries and he can take 1 if he is having significant increased pain. He has a walker also cane and will start with a walker work his way onto the cane as his symptoms improved.  Follow-Up Instructions: Return if symptoms worsen or fail to improve.   Orders:  Orders Placed This Encounter  Procedures  . XR Knee 1-2 Views Left   Meds ordered this encounter  Medications  . zolpidem (AMBIEN) 10 MG tablet    Sig: Take 1 tablet (10 mg total) by mouth at bedtime as needed for sleep.    Dispense:  10 tablet    Refill:  0      Procedures: No procedures performed   Clinical Data: No additional findings.   Subjective: No chief complaint on file.   Larey Seat Wednesday hurt left knee, painful and swollen, can not bear weight has been using walker per Daughter. Stayed off leg all weekend. Use ICE, Elevation. Did not take anything for pain due to recent Heart attack on Nov 13  Patient's [place] Shepherd dog in the yard and the patient tripped falling  to the ground with abrasions over his left tibial tubercle and significant increased pain in his left knee. Patient's had difficulty with weightbearing pain and swelling in his knee. Previous right total knee arthroplasty done by Dr. Otelia Sergeant which has been doing well. No problems with his right knee after his fall  Review of Systems  Constitutional: Negative for chills and diaphoresis.  HENT: Negative for ear discharge, ear pain and nosebleeds.   Eyes: Negative for discharge and visual disturbance.  Respiratory: Negative for cough, choking and shortness of breath.   Cardiovascular: Negative for chest pain and palpitations.       Previous bypass surgery 2001. He's had the angioplasty performed in the last 6 months.  Gastrointestinal: Negative for abdominal distention and abdominal pain.  Endocrine: Negative for cold intolerance and heat intolerance.  Genitourinary: Negative for flank pain and hematuria.  Skin: Negative for rash and wound.  Neurological: Negative for seizures and speech difficulty.  Hematological: Negative for adenopathy. Does not bruise/bleed easily.  Psychiatric/Behavioral: Negative for agitation and suicidal ideas.     Objective: Vital Signs: BP 130/70 (BP Location: Left Arm)   Pulse 72   Ht 6\' 3"  (1.905 m)   Wt 225 lb (102.1 kg)  BMI 28.12 kg/m   Physical Exam  Constitutional: He is oriented to person, place, and time. He appears well-developed and well-nourished.  HENT:  Head: Normocephalic and atraumatic.  Eyes: EOM are normal. Pupils are equal, round, and reactive to light.  Neck: No tracheal deviation present. No thyromegaly present.  Cardiovascular: Normal rate.   Pulmonary/Chest: Effort normal. He has no wheezes.  Abdominal: Soft. Bowel sounds are normal.  Musculoskeletal:  Well-healed right midline incision was done the arthroplasty. Range of motion of his hips are normal. There is an abrasion located over his left tibial tubercle and GERDY's tubercle. No  cellulitis. Medial joint line tenderness is present. Distal pulses palpable  Neurological: He is alert and oriented to person, place, and time.  Skin: Skin is warm and dry. Capillary refill takes less than 2 seconds.  Psychiatric: He has a normal mood and affect. His behavior is normal. Judgment and thought content normal.    Ortho Exam Patient is ambulatory with minimal knee flexion. He has full extension and flexes to 120. Collateral ligaments and cruciate ligament exam is normal. Ankle range of motion hip range of motion is normal. Normal patellar tracking. Patient has ecchymosis second and third right toe with no plantar foot lesions. Tenderness over the proximal phalanx without rotational deformity and no angular deformity. Specialty Comments:  No specialty comments available.  Imaging: Xr Knee 1-2 Views Left  Result Date: 07/24/2016 Standing AP x-ray both knees and lateral of left knee is reviewed. This shows previous right total knee arthroplasty with good position and alignment. Left knee shows medial joint line narrowing without acute fracture small lateral joint osteophytes. Small amount of Calcification of the popliteal artery. Old vascular clips from previous heart surgery Impression: No acute fracture left knee some degenerative arthritis primarily medial compartment    PMFS History: Patient Active Problem List   Diagnosis Date Noted  . Knee contusion 07/24/2016  . Spinal stenosis in cervical region 05/14/2015    Class: Chronic  . Cubital tunnel syndrome on left 05/14/2015    Class: Chronic  . Stenosis of cervical spine with myelopathy 05/14/2015  . Spinal stenosis, lumbar region, with neurogenic claudication 01/16/2014    Class: Chronic   Past Medical History:  Diagnosis Date  . Anginal pain (HCC)    occ  . Arthritis   . Bronchitis    hx  . Complication of anesthesia    extremely claustrophobic; pt stated "sometimes I wake up crazy"  . Coronary artery disease     . Diabetes mellitus without complication (HCC)   . History of kidney stones   . HOH (hard of hearing)    wearing hearing aids  . Hypertension   . Myocardial infarction 2001   X 3    No family history on file.  Past Surgical History:  Procedure Laterality Date  . ANTERIOR CERVICAL DECOMP/DISCECTOMY FUSION N/A 05/14/2015   Procedure: C3-4  ANTERIOR CERVICAL DISCECTOMY AND FUSION WITH PLATE AND SCREWS, LOCAL AND ALLOGRAFT BONE GRAFT;  Surgeon: Kerrin ChampagneJames E Nitka, MD;  Location: [facility] OR;  Service: Orthopedics;  Laterality: N/A;  . BACK SURGERY    . CARDIAC CATHETERIZATION     X 13  . COLONOSCOPY    . CORONARY ANGIOPLASTY    . CORONARY ARTERY BYPASS GRAFT  01  . EYE SURGERY Bilateral 13   cataracts  . JOINT REPLACEMENT Right 12   knee  . LUMBAR LAMINECTOMY/DECOMPRESSION MICRODISCECTOMY N/A 01/16/2014   Procedure: Left L3-4 and L4-5 foraminotomies;  Surgeon: Fayrene FearingJames  Lynne LoganE Nitka, MD;  Location: [facility] OR;  Service: Orthopedics;  Laterality: N/A;  . ULNAR NERVE TRANSPOSITION Left 05/14/2015   Procedure: LEFT ULNAR NERVE DECOMPRESSION AT THE ELBOW;  Surgeon: Kerrin ChampagneJames E Nitka, MD;  [facility];  Service: Orthopedics;  Laterality: Left;   Social History   Occupational History  . Not on file.   Social History Main Topics  . Smoking status: Former Smoker    Packs/day: 1.00    Years: 2.00    Quit date: 01/09/1957  . Smokeless tobacco: Not on file  . Alcohol use No  . Drug use: No  . Sexual activity: Not on file

## 2016-08-04 ENCOUNTER — Encounter (HOSPITAL_BASED_OUTPATIENT_CLINIC_OR_DEPARTMENT_OTHER): Payer: Self-pay | Admitting: Emergency Medicine

## 2016-08-04 ENCOUNTER — Emergency Department (HOSPITAL_BASED_OUTPATIENT_CLINIC_OR_DEPARTMENT_OTHER)
Admission: EM | Admit: 2016-08-04 | Discharge: 2016-08-04 | Disposition: A | Discharge status: Home / Self Care | Payer: Medicare HMO | Attending: Emergency Medicine | Admitting: Emergency Medicine

## 2016-08-04 DIAGNOSIS — T8189XA Other complications of procedures, not elsewhere classified, initial encounter: Secondary | ICD-10-CM | POA: Diagnosis not present

## 2016-08-04 DIAGNOSIS — Z96651 Presence of right artificial knee joint: Secondary | ICD-10-CM | POA: Diagnosis not present

## 2016-08-04 DIAGNOSIS — Z7982 Long term (current) use of aspirin: Secondary | ICD-10-CM | POA: Diagnosis not present

## 2016-08-04 DIAGNOSIS — Z7984 Long term (current) use of oral hypoglycemic drugs: Secondary | ICD-10-CM | POA: Diagnosis not present

## 2016-08-04 DIAGNOSIS — Z87891 Personal history of nicotine dependence: Secondary | ICD-10-CM | POA: Diagnosis not present

## 2016-08-04 DIAGNOSIS — Y69 Unspecified misadventure during surgical and medical care: Secondary | ICD-10-CM | POA: Diagnosis not present

## 2016-08-04 DIAGNOSIS — I2510 Atherosclerotic heart disease of native coronary artery without angina pectoris: Secondary | ICD-10-CM | POA: Diagnosis not present

## 2016-08-04 DIAGNOSIS — Z4800 Encounter for change or removal of nonsurgical wound dressing: Secondary | ICD-10-CM | POA: Diagnosis present

## 2016-08-04 DIAGNOSIS — T148XXA Other injury of unspecified body region, initial encounter: Secondary | ICD-10-CM

## 2016-08-04 DIAGNOSIS — Z951 Presence of aortocoronary bypass graft: Secondary | ICD-10-CM | POA: Diagnosis not present

## 2016-08-04 DIAGNOSIS — E119 Type 2 diabetes mellitus without complications: Secondary | ICD-10-CM | POA: Diagnosis not present

## 2016-08-04 NOTE — ED Triage Notes (Signed)
Pt had bump on left arm, opened up last night and is drainging

## 2016-08-04 NOTE — ED Notes (Signed)
ED Provider at bedside. 

## 2016-08-04 NOTE — Discharge Instructions (Signed)
Wear dressing as applied for the next 24 hours.  After the next 24 hours, begin dressing changes with bacitracin and Band-Aid twice daily.  If bleeding recurs, apply direct pressure for his long as possible.  Return to the emergency department if bleeding recurs and is unable to stop, or you develop redness, pain, warmth, or pus draining from the wound.

## 2016-08-04 NOTE — ED Provider Notes (Signed)
[facility] Provider Note   CSN: [id number] Arrival date & time: 08/04/16  0815     History   Chief Complaint Chief Complaint  Patient presents with  . [id number] Check    HPI Jeffrey Nelson is a 81 y.o. male.  Patient is a 81 year old male with past medical history of coronary artery disease with recent MI currently taking Plavix. He presents for evaluation of bleeding from his left arm. He reports picking at an area of dry skin to the back of his tricep. He reports picking off a small area of tissue which has been bleeding through the night. He states that there was a good deal of blood on the bed sheet. He has tried direct pressure, however it is not stopping. He denies any redness or purulent drainage.      Past Medical History:  Diagnosis Date  . Anginal pain (HCC)    occ  . Arthritis   . Bronchitis    hx  . Complication of anesthesia    extremely claustrophobic; pt stated "sometimes I wake up crazy"  . Coronary artery disease   . Diabetes mellitus without complication (HCC)   . History of kidney stones   . HOH (hard of hearing)    wearing hearing aids  . Hypertension   . Myocardial infarction 2001   X 3    Patient Active Problem List   Diagnosis Date Noted  . Knee contusion 07/24/2016  . Spinal stenosis in cervical region 05/14/2015    Class: Chronic  . Cubital tunnel syndrome on left 05/14/2015    Class: Chronic  . Stenosis of cervical spine with myelopathy 05/14/2015  . Spinal stenosis, lumbar region, with neurogenic claudication 01/16/2014    Class: Chronic    Past Surgical History:  Procedure Laterality Date  . ANTERIOR CERVICAL DECOMP/DISCECTOMY FUSION N/A 05/14/2015   Procedure: C3-4  ANTERIOR CERVICAL DISCECTOMY AND FUSION WITH PLATE AND SCREWS, LOCAL AND ALLOGRAFT BONE GRAFT;  Surgeon: Kerrin ChampagneJames E Nitka, MD;  Location: [facility] OR;  Service: Orthopedics;  Laterality: N/A;  . BACK SURGERY    . CARDIAC CATHETERIZATION     X 13  . COLONOSCOPY      . CORONARY ANGIOPLASTY    . CORONARY ARTERY BYPASS GRAFT  01  . EYE SURGERY Bilateral 13   cataracts  . JOINT REPLACEMENT Right 12   knee  . LUMBAR LAMINECTOMY/DECOMPRESSION MICRODISCECTOMY N/A 01/16/2014   Procedure: Left L3-4 and L4-5 foraminotomies;  Surgeon: Kerrin ChampagneJames E Nitka, MD;  [facility];  Service: Orthopedics;  Laterality: N/A;  . ULNAR NERVE TRANSPOSITION Left 05/14/2015   Procedure: LEFT ULNAR NERVE DECOMPRESSION AT THE ELBOW;  Surgeon: Kerrin ChampagneJames E Nitka, MD;  [facility];  Service: Orthopedics;  Laterality: Left;       Home Medications    Prior to Admission medications   Medication Sig Start Date End Date Taking? Authorizing Provider  amLODipine (NORVASC) 5 MG tablet Take 5 mg by mouth daily.    Historical Provider, MD  aspirin EC 81 MG tablet Take 81 mg by mouth daily.    Historical Provider, MD  atenolol (TENORMIN) 50 MG tablet Take 50 mg by mouth daily.    Historical Provider, MD  clopidogrel (PLAVIX) 75 MG tablet Take 75 mg by mouth daily.    Historical Provider, MD  Coenzyme Q10 (COQ10 PO) Take 1 tablet by mouth daily.    Historical Provider, MD  Cyanocobalamin (VITAMIN B-12 PO) Take 1 tablet by mouth daily.  Historical Provider, MD  metFORMIN (GLUCOPHAGE) 500 MG tablet Take 500 mg by mouth daily with breakfast.    Historical Provider, MD  oxyCODONE (OXY IR/ROXICODONE) 5 MG immediate release tablet Take 1-3 tablets (5-15 mg total) by mouth every 4 (four) hours as needed. Patient not taking: Reported on 07/24/2016 05/15/15   Tarry KosNaiping M Xu, MD  oxyCODONE-acetaminophen (PERCOCET) 10-325 MG per tablet Take 1 tablet by mouth every 4 (four) hours as needed for pain. Patient not taking: Reported on 07/24/2016 05/14/15   Kerrin ChampagneJames E Nitka, MD  zolpidem (AMBIEN) 10 MG tablet Take 1 tablet (10 mg total) by mouth at bedtime as needed for sleep. 07/24/16   Eldred MangesMark C Yates, MD    Family History History reviewed. No pertinent family history.  Social History Social History   Substance Use Topics  . Smoking status: Former Smoker    Packs/day: 1.00    Years: 2.00    Quit date: 01/09/1957  . Smokeless tobacco: Never Used  . Alcohol use No     Allergies   Ezetimibe; Morphine; Morphine and related; and Statins   Review of Systems Review of Systems  All other systems reviewed and are negative.    Physical Exam Updated Vital Signs BP 164/94   Pulse 66   Temp 98.7 F (37.1 C) (Oral)   Resp 18   Ht 6\' 3"  (1.905 m)   Wt 220 lb (99.8 kg)   SpO2 100%   BMI 27.50 kg/m   Physical Exam  Constitutional: He appears well-developed and well-nourished.  HENT:  Head: Normocephalic and atraumatic.  Neck: Normal range of motion. Neck supple.  Musculoskeletal: Normal range of motion.  Skin: Skin is warm and dry.  There is a small punctate area to the left tricep with continuous oozing of blood. There is no surrounding erythema, warmth, or purulent drainage.  Nursing note and vitals reviewed.    ED Treatments / Results  Labs (all labs ordered are listed, but only abnormal results are displayed) Labs Reviewed - No data to display  EKG  EKG Interpretation None       Radiology No results found.  Procedures Procedures (including critical care time)  Medications Ordered in ED Medications - No data to display   Initial Impression / Assessment and Plan / ED Course  I have reviewed the triage vital signs and the nursing notes.  Pertinent labs & imaging results that were available during my care of the patient were reviewed by me and considered in my medical decision making (see chart for details).  Clinical Course     Dressing was applied using wound seal and an Ace bandage. He is to leave this on for the next 24 hours and return as needed for any problems. There are no signs of infection that would require an antibiotic at this time.  Final Clinical Impressions(s) / ED Diagnoses   Final diagnoses:  None    New Prescriptions New  Prescriptions   No medications on file     Geoffery Lyonsouglas Layla Kesling, MD 08/04/16 [phone]

## 2016-09-02 IMAGING — CR DG CERVICAL SPINE 2 OR 3 VIEWS
2 series · 2 of 2 positions shown · non-contrast
Comparison: None

CLINICAL DATA: Status post ACDF of C3-4

EXAM:
CERVICAL SPINE - 2-3 VIEW

[AP (1 of 2)]
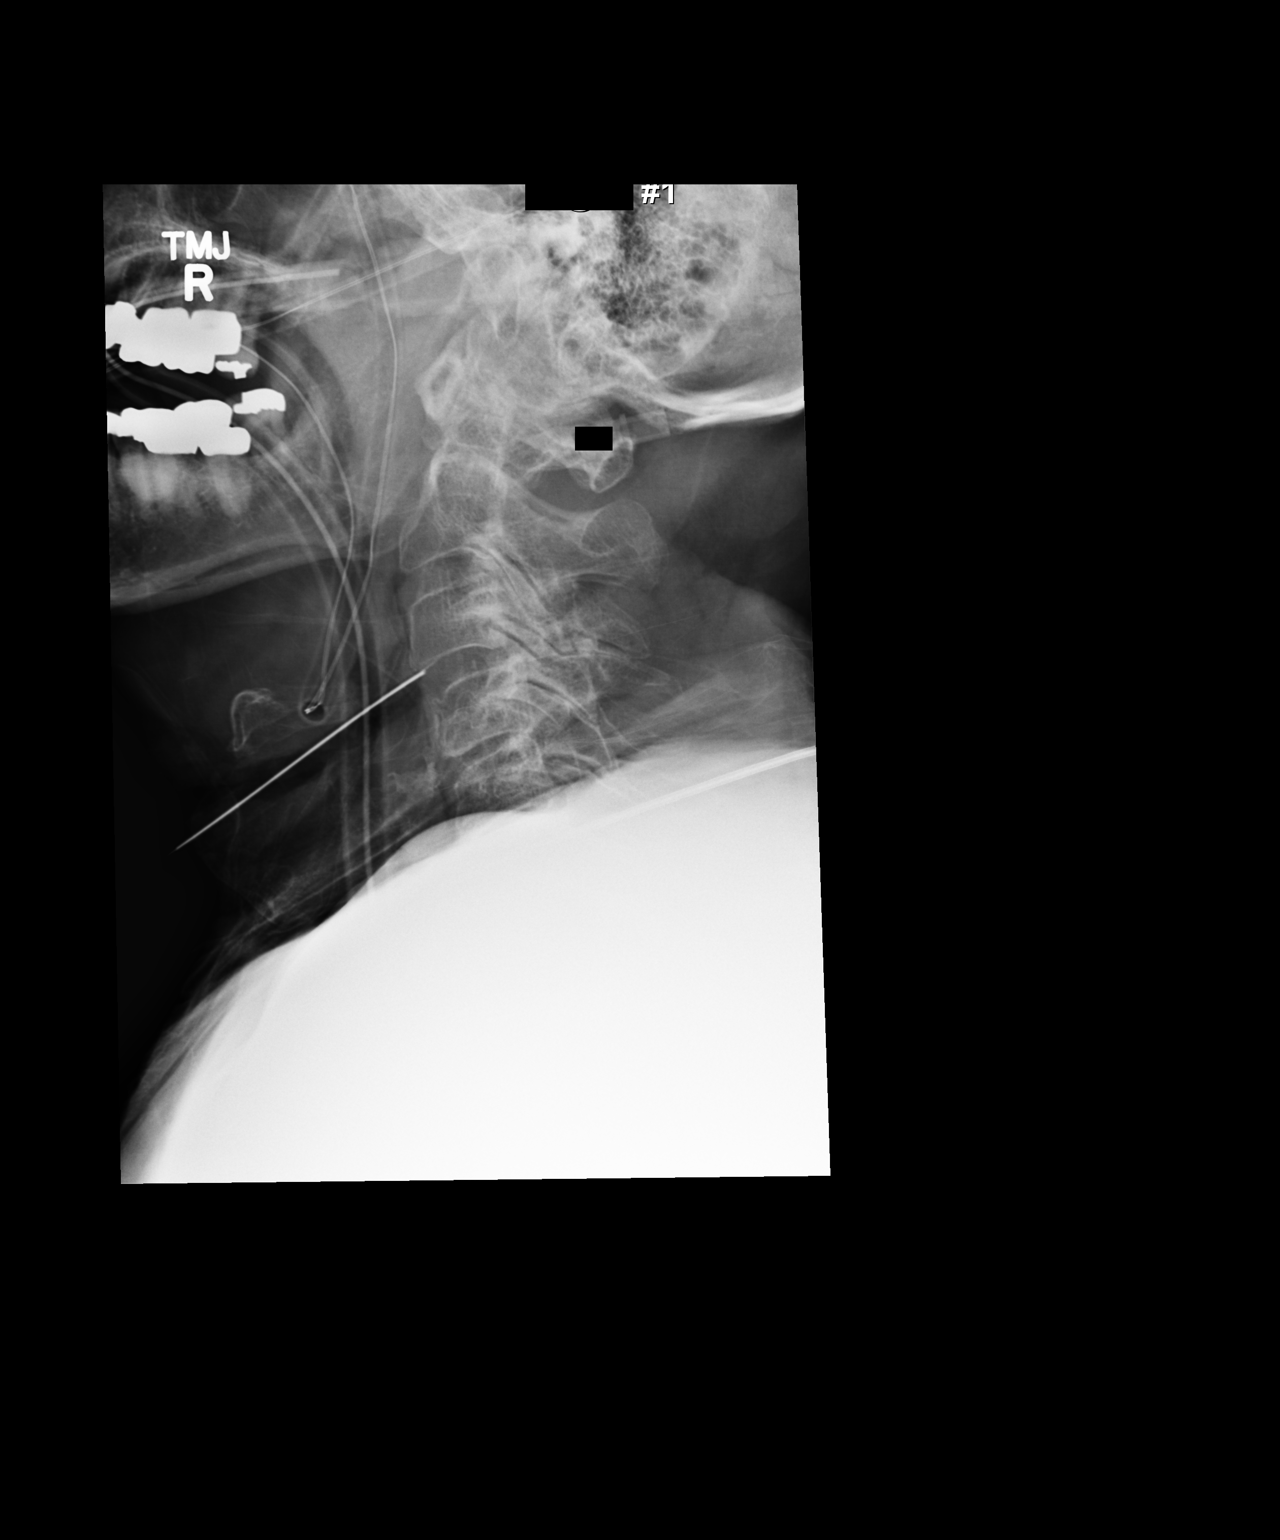

[AP (2 of 2)]
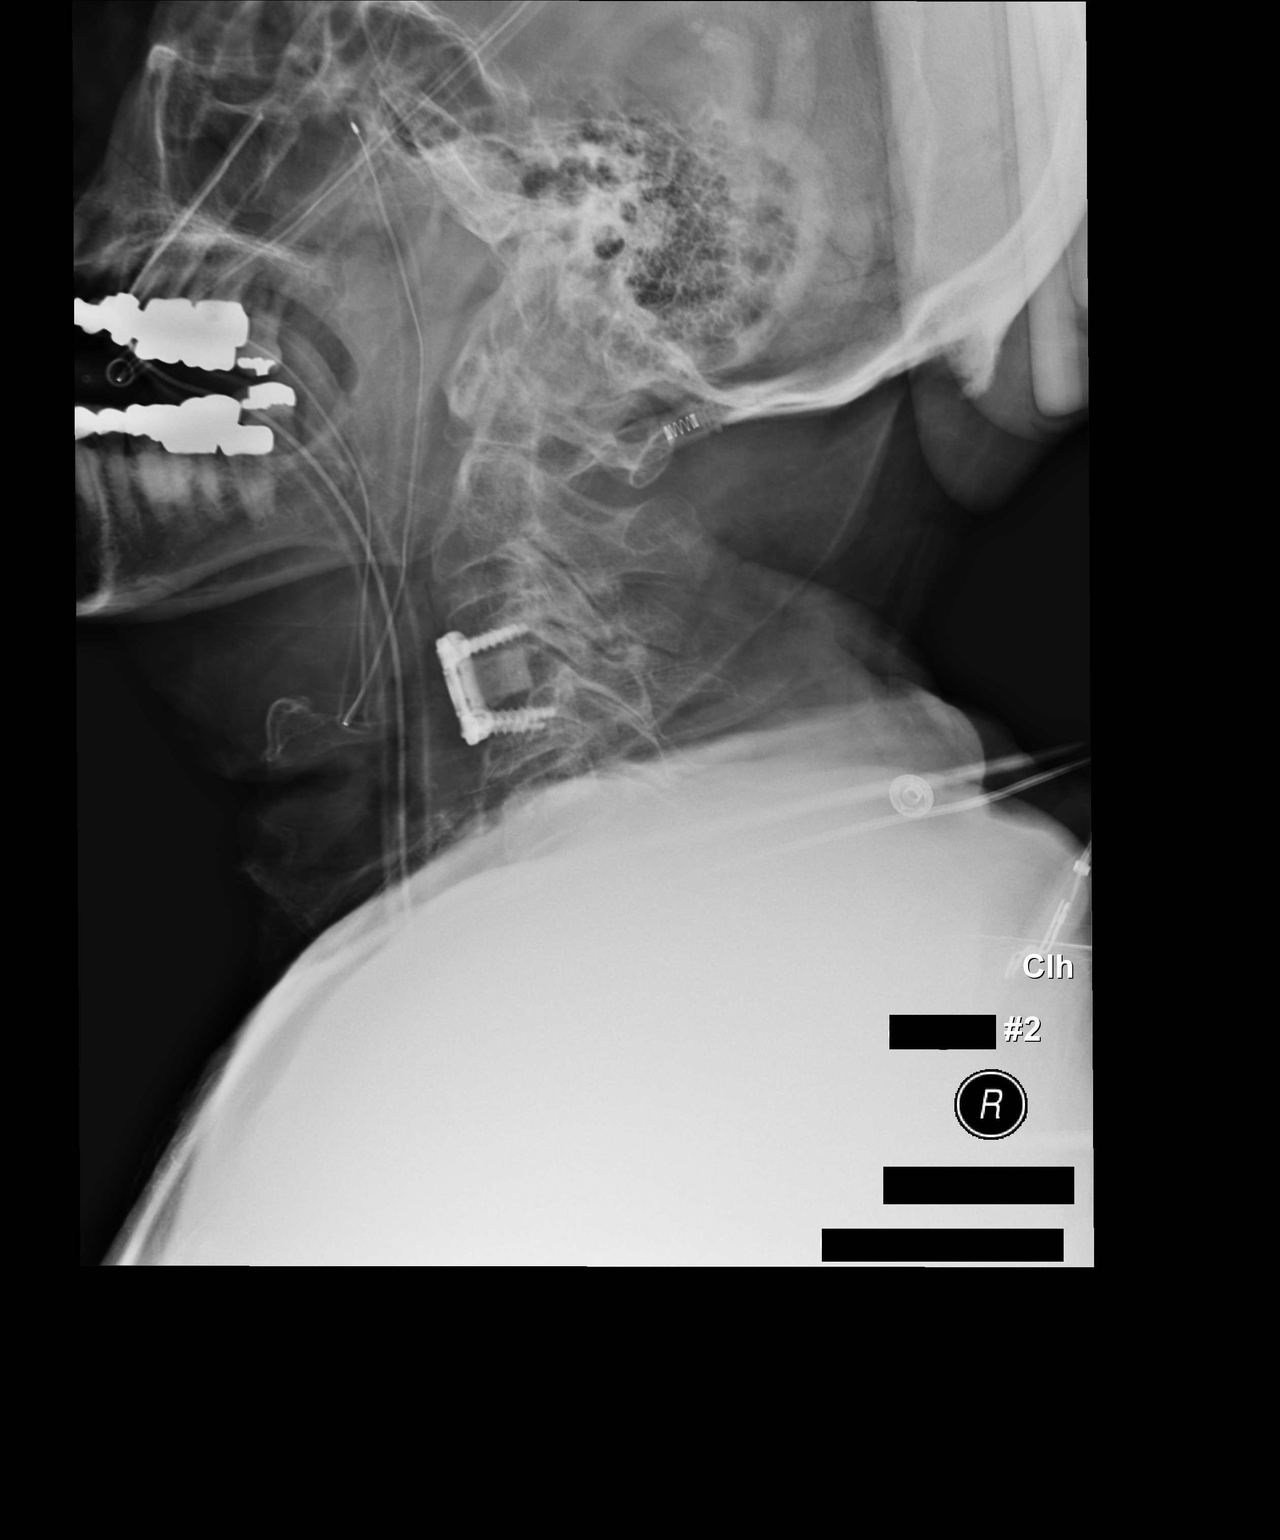

[2 of 2 positions shown; findings below may reference images not displayed]

FINDINGS: Single lateral portable radiograph of the cervical spine shows a
surgical probe within the C3-4 disc space.
IMPRESSION: 1. Surgical probe localizes the C3-4 disc space.

## 2017-06-28 HISTORY — PX: CORONARY ANGIOPLASTY: SHX604

## 2017-10-05 ENCOUNTER — Ambulatory Visit (INDEPENDENT_AMBULATORY_CARE_PROVIDER_SITE_OTHER): Payer: Medicare HMO

## 2017-10-05 ENCOUNTER — Ambulatory Visit (INDEPENDENT_AMBULATORY_CARE_PROVIDER_SITE_OTHER): Payer: Medicare HMO | Admitting: Family

## 2017-10-05 ENCOUNTER — Encounter (INDEPENDENT_AMBULATORY_CARE_PROVIDER_SITE_OTHER): Payer: Self-pay | Admitting: Family

## 2017-10-05 DIAGNOSIS — M4316 Spondylolisthesis, lumbar region: Secondary | ICD-10-CM | POA: Diagnosis not present

## 2017-10-05 DIAGNOSIS — M545 Low back pain, unspecified: Secondary | ICD-10-CM

## 2017-10-05 DIAGNOSIS — M5416 Radiculopathy, lumbar region: Secondary | ICD-10-CM

## 2017-10-05 MED ORDER — OXYCODONE-ACETAMINOPHEN 5-325 MG PO TABS: 1.0000 | ORAL_TABLET | Freq: Four times a day (QID) | ORAL | 0 refills | Status: DC | PRN

## 2017-10-05 MED ORDER — METHYLPREDNISOLONE 4 MG PO TBPK: ORAL_TABLET | ORAL | 0 refills | Status: DC

## 2017-10-05 NOTE — Progress Notes (Signed)
Office Visit Note   Patient: Jeffrey Nelson           Date of Birth: Feb 28, 1935           MRN: [id number] Visit Date: 10/05/2017              Requested by: Oda KiltsBrown, Sherry, MD [address] [place], [place] [address] PCP: Oda KiltsBrown, Sherry, MD  No chief complaint on file.     HPI: The patient is an 83 year old gentleman seen today for evaluation of right low back and hip pain following washing and waxing his car yesterday. Has had sciatica in past states the pain he's having today is worse than previous bouts of sciatica. Last episode was several years ago.   Went to bed last night pain free. Woke with excruciating pain right low back, lateral hip, radiates down to ankle. Associated with numbness and tingling. No weakness. No loss of bowel or bladder. Pain with standing and weight bearing. More comfortable seated.   Assessment & Plan: Visit Diagnoses:  1. Lumbar pain   2. Spondylolisthesis, lumbar region   3. Lumbar radiculopathy     Plan: radiculopathy right sided. will provide a week of Percocet for pain. Medrol dose pack for radiculopathy.   Seen together with Dr. Otelia SergeantNitka today. Will follow up in office in 2 weeks with dr. Otelia SergeantNitka.  Follow-Up Instructions: Return in about 2 weeks (around 10/19/2017) for c nitka.   Back Exam   Tenderness  Back tenderness location: no spinous process tenderness, right lumbar soft tissue tenderness.  Tests  Straight leg raise right: positive Straight leg raise left: negative  Comments:  Sciatic tension on right, pain with flexion right hip      Patient is alert, oriented, no adenopathy, well-dressed, normal affect, normal respiratory effort. Rotation of right hip pain free  Imaging: No results found. No images are attached to the encounter.  Labs: Lab Results  Component Value Date   HGBA1C 6.7 (H) 05/07/2015   HGBA1C (H) 04/21/2010    6.8 (NOTE)                                                                       According to the  ADA Clinical Practice Recommendations for 2011, when HbA1c is used as a screening test:   >=6.5%   Diagnostic of Diabetes Mellitus           (if abnormal result  is confirmed)  5.7-6.4%   Increased risk of developing Diabetes Mellitus  References:Diagnosis and Classification of Diabetes Mellitus,Diabetes Care,2011,34(Suppl 1):S62-S69 and Standards of Medical Care in         Diabetes - 2011,Diabetes Care,2011,34  (Suppl 1):S11-S61.    @LABSALLVALUES (HGBA1)@  There is no height or weight on file to calculate BMI.  Orders:  Orders Placed This Encounter  Procedures  . XR Lumbar Spine 2-3 Views   Meds ordered this encounter  Medications  . oxyCODONE-acetaminophen (PERCOCET/ROXICET) 5-325 MG tablet    Sig: Take 1 tablet by mouth every 6 (six) hours as needed for severe pain.    Dispense:  30 tablet    Refill:  0  . methylPREDNISolone (MEDROL) 4 MG TBPK tablet    Sig: Take according to packet instructions  Dispense:  21 tablet    Refill:  0     Procedures: No procedures performed  Clinical Data: No additional findings.  ROS:  All other systems negative, except as noted in the HPI. Review of Systems  Constitutional: Negative for chills and fever.  Musculoskeletal: Positive for back pain and myalgias.  Neurological: Positive for numbness. Negative for weakness.    Objective: Vital Signs: There were no vitals taken for this visit.  Specialty Comments:  No specialty comments available.  PMFS History: Patient Active Problem List   Diagnosis Date Noted  . Spondylolisthesis, lumbar region 10/05/2017  . Knee contusion 07/24/2016  . Spinal stenosis in cervical region 05/14/2015    Class: Chronic  . Cubital tunnel syndrome on left 05/14/2015    Class: Chronic  . Stenosis of cervical spine with myelopathy 05/14/2015  . Spinal stenosis, lumbar region, with neurogenic claudication 01/16/2014    Class: Chronic   Past Medical History:  Diagnosis Date  . Anginal pain (HCC)      occ  . Arthritis   . Bronchitis    hx  . Complication of anesthesia    extremely claustrophobic; pt stated "sometimes I wake up crazy"  . Coronary artery disease   . Diabetes mellitus without complication (HCC)   . History of kidney stones   . HOH (hard of hearing)    wearing hearing aids  . Hypertension   . Myocardial infarction (HCC) 2001   X 3    History reviewed. No pertinent family history.  Past Surgical History:  Procedure Laterality Date  . ANTERIOR CERVICAL DECOMP/DISCECTOMY FUSION N/A 05/14/2015   Procedure: C3-4  ANTERIOR CERVICAL DISCECTOMY AND FUSION WITH PLATE AND SCREWS, LOCAL AND ALLOGRAFT BONE GRAFT;  Surgeon: Kerrin Champagne, MD;  Location: [facility] OR;  Service: Orthopedics;  Laterality: N/A;  . BACK SURGERY    . CARDIAC CATHETERIZATION     X 13  . COLONOSCOPY    . CORONARY ANGIOPLASTY    . CORONARY ARTERY BYPASS GRAFT  01  . EYE SURGERY Bilateral 13   cataracts  . JOINT REPLACEMENT Right 12   knee  . LUMBAR LAMINECTOMY/DECOMPRESSION MICRODISCECTOMY N/A 01/16/2014   Procedure: Left L3-4 and L4-5 foraminotomies;  Surgeon: Kerrin Champagne, MD;  [facility];  Service: Orthopedics;  Laterality: N/A;  . ULNAR NERVE TRANSPOSITION Left 05/14/2015   Procedure: LEFT ULNAR NERVE DECOMPRESSION AT THE ELBOW;  Surgeon: Kerrin Champagne, MD;  Location: [facility] OR;  Service: Orthopedics;  Laterality: Left;   Social History   Occupational History  . Not on file  Tobacco Use  . Smoking status: Former Smoker    Packs/day: 1.00    Years: 2.00    Pack years: 2.00    Last attempt to quit: 01/09/1957    Years since quitting: 60.7  . Smokeless tobacco: Never Used  Substance and Sexual Activity  . Alcohol use: No  . Drug use: No  . Sexual activity: Not on file

## 2017-10-08 ENCOUNTER — Telehealth (INDEPENDENT_AMBULATORY_CARE_PROVIDER_SITE_OTHER): Payer: Self-pay | Admitting: Specialist

## 2017-10-08 ENCOUNTER — Telehealth (INDEPENDENT_AMBULATORY_CARE_PROVIDER_SITE_OTHER): Payer: Self-pay

## 2017-10-08 NOTE — Telephone Encounter (Signed)
Please call pt wife to discuss pt care. Wife stated pt isn't doing well [facility]

## 2017-10-08 NOTE — Telephone Encounter (Signed)
Pt is requesting a call back from [facility] to discuss pt. States he is much worse since being seen on Friday.He is in extreme pain. Percocet with Ibuprofen  And heat not helping. Has falled 4 times since this morning. Last fall was onto knee that he had replaced and knee is now swollen. Advised to apply ice. Wife in tears because she doesn't know what to do for him. Please call to discuss.

## 2017-10-08 NOTE — Telephone Encounter (Signed)
I called and worked patient in at 830 am on 10/09/2017 

## 2017-10-08 NOTE — Telephone Encounter (Signed)
I called and worked patient in at 830 am on 10/09/2017

## 2017-10-09 ENCOUNTER — Ambulatory Visit (INDEPENDENT_AMBULATORY_CARE_PROVIDER_SITE_OTHER): Payer: Medicare HMO

## 2017-10-09 ENCOUNTER — Inpatient Hospital Stay (HOSPITAL_COMMUNITY)
Admission: AD | Admit: 2017-10-09 | Discharge: 2017-10-14 | DRG: 520 | Disposition: A | Discharge status: Home Health | Payer: MEDICARE | Source: Ambulatory Visit | Attending: Specialist | Admitting: Specialist

## 2017-10-09 ENCOUNTER — Ambulatory Visit (INDEPENDENT_AMBULATORY_CARE_PROVIDER_SITE_OTHER): Payer: Medicare HMO | Admitting: Specialist

## 2017-10-09 ENCOUNTER — Other Ambulatory Visit (INDEPENDENT_AMBULATORY_CARE_PROVIDER_SITE_OTHER): Payer: Self-pay | Admitting: Specialist

## 2017-10-09 ENCOUNTER — Encounter (INDEPENDENT_AMBULATORY_CARE_PROVIDER_SITE_OTHER): Payer: Self-pay | Admitting: Specialist

## 2017-10-09 ENCOUNTER — Encounter (HOSPITAL_COMMUNITY): Payer: Self-pay | Admitting: General Practice

## 2017-10-09 ENCOUNTER — Other Ambulatory Visit: Payer: Self-pay

## 2017-10-09 VITALS — BP 124/54 | HR 49 | Ht 75.0 in | Wt 227.0 lb

## 2017-10-09 DIAGNOSIS — Z955 Presence of coronary angioplasty implant and graft: Secondary | ICD-10-CM

## 2017-10-09 DIAGNOSIS — R29898 Other symptoms and signs involving the musculoskeletal system: Secondary | ICD-10-CM | POA: Diagnosis not present

## 2017-10-09 DIAGNOSIS — E119 Type 2 diabetes mellitus without complications: Secondary | ICD-10-CM | POA: Diagnosis present

## 2017-10-09 DIAGNOSIS — Z888 Allergy status to other drugs, medicaments and biological substances status: Secondary | ICD-10-CM | POA: Diagnosis not present

## 2017-10-09 DIAGNOSIS — M25551 Pain in right hip: Secondary | ICD-10-CM | POA: Diagnosis present

## 2017-10-09 DIAGNOSIS — R296 Repeated falls: Secondary | ICD-10-CM | POA: Diagnosis present

## 2017-10-09 DIAGNOSIS — Z951 Presence of aortocoronary bypass graft: Secondary | ICD-10-CM | POA: Diagnosis not present

## 2017-10-09 DIAGNOSIS — H9190 Unspecified hearing loss, unspecified ear: Secondary | ICD-10-CM | POA: Diagnosis present

## 2017-10-09 DIAGNOSIS — R001 Bradycardia, unspecified: Secondary | ICD-10-CM | POA: Diagnosis not present

## 2017-10-09 DIAGNOSIS — Z885 Allergy status to narcotic agent status: Secondary | ICD-10-CM | POA: Diagnosis not present

## 2017-10-09 DIAGNOSIS — M5416 Radiculopathy, lumbar region: Secondary | ICD-10-CM | POA: Diagnosis not present

## 2017-10-09 DIAGNOSIS — I10 Essential (primary) hypertension: Secondary | ICD-10-CM | POA: Diagnosis present

## 2017-10-09 DIAGNOSIS — S32511A Fracture of superior rim of right pubis, initial encounter for closed fracture: Secondary | ICD-10-CM | POA: Diagnosis not present

## 2017-10-09 DIAGNOSIS — M5116 Intervertebral disc disorders with radiculopathy, lumbar region: Secondary | ICD-10-CM | POA: Diagnosis present

## 2017-10-09 DIAGNOSIS — I2510 Atherosclerotic heart disease of native coronary artery without angina pectoris: Secondary | ICD-10-CM | POA: Diagnosis present

## 2017-10-09 DIAGNOSIS — S72464A Nondisplaced supracondylar fracture with intracondylar extension of lower end of right femur, initial encounter for closed fracture: Secondary | ICD-10-CM

## 2017-10-09 DIAGNOSIS — M549 Dorsalgia, unspecified: Secondary | ICD-10-CM | POA: Diagnosis present

## 2017-10-09 DIAGNOSIS — M25561 Pain in right knee: Secondary | ICD-10-CM

## 2017-10-09 DIAGNOSIS — Z87442 Personal history of urinary calculi: Secondary | ICD-10-CM

## 2017-10-09 DIAGNOSIS — W19XXXA Unspecified fall, initial encounter: Secondary | ICD-10-CM

## 2017-10-09 DIAGNOSIS — Z79899 Other long term (current) drug therapy: Secondary | ICD-10-CM

## 2017-10-09 DIAGNOSIS — S83411A Sprain of medial collateral ligament of right knee, initial encounter: Secondary | ICD-10-CM | POA: Diagnosis present

## 2017-10-09 DIAGNOSIS — W1809XA Striking against other object with subsequent fall, initial encounter: Secondary | ICD-10-CM | POA: Diagnosis present

## 2017-10-09 DIAGNOSIS — M5387 Other specified dorsopathies, lumbosacral region: Secondary | ICD-10-CM

## 2017-10-09 DIAGNOSIS — D649 Anemia, unspecified: Secondary | ICD-10-CM | POA: Diagnosis present

## 2017-10-09 DIAGNOSIS — Z419 Encounter for procedure for purposes other than remedying health state, unspecified: Secondary | ICD-10-CM

## 2017-10-09 DIAGNOSIS — Y92003 Bedroom of unspecified non-institutional (private) residence as the place of occurrence of the external cause: Secondary | ICD-10-CM | POA: Diagnosis not present

## 2017-10-09 DIAGNOSIS — Z7982 Long term (current) use of aspirin: Secondary | ICD-10-CM | POA: Diagnosis not present

## 2017-10-09 DIAGNOSIS — Z8673 Personal history of transient ischemic attack (TIA), and cerebral infarction without residual deficits: Secondary | ICD-10-CM

## 2017-10-09 DIAGNOSIS — Z96651 Presence of right artificial knee joint: Secondary | ICD-10-CM | POA: Diagnosis present

## 2017-10-09 DIAGNOSIS — I252 Old myocardial infarction: Secondary | ICD-10-CM | POA: Diagnosis not present

## 2017-10-09 DIAGNOSIS — S8391XA Sprain of unspecified site of right knee, initial encounter: Secondary | ICD-10-CM | POA: Diagnosis present

## 2017-10-09 HISTORY — DX: Personal history of other medical treatment: Z92.89

## 2017-10-09 HISTORY — DX: Low back pain, unspecified: M54.50

## 2017-10-09 HISTORY — DX: Cerebral infarction, unspecified: I63.9

## 2017-10-09 HISTORY — DX: Pure hypercholesterolemia, unspecified: E78.00

## 2017-10-09 HISTORY — DX: Other chronic pain: G89.29

## 2017-10-09 HISTORY — DX: Low back pain: M54.5

## 2017-10-09 HISTORY — DX: Type 2 diabetes mellitus without complications: E11.9

## 2017-10-09 LAB — CBC WITH DIFFERENTIAL/PLATELET
BASOS ABS: 0 10*3/uL (ref 0.0–0.1)
BASOS PCT: 0 %
EOS PCT: 1 %
Eosinophils Absolute: 0.1 10*3/uL (ref 0.0–0.7)
HCT: 37.8 % — ABNORMAL LOW (ref 39.0–52.0)
Hemoglobin: 12.5 g/dL — ABNORMAL LOW (ref 13.0–17.0)
Lymphocytes Relative: 13 %
Lymphs Abs: 1.7 10*3/uL (ref 0.7–4.0)
MCH: 32.6 pg (ref 26.0–34.0)
MCHC: 33.1 g/dL (ref 30.0–36.0)
MCV: 98.7 fL (ref 78.0–100.0)
MONO ABS: 1.5 10*3/uL — AB (ref 0.1–1.0)
Monocytes Relative: 11 %
NEUTROS ABS: 10.1 10*3/uL — AB (ref 1.7–7.7)
Neutrophils Relative %: 75 %
PLATELETS: 203 10*3/uL (ref 150–400)
RBC: 3.83 MIL/uL — ABNORMAL LOW (ref 4.22–5.81)
RDW: 13.4 % (ref 11.5–15.5)
WBC: 13.3 10*3/uL — ABNORMAL HIGH (ref 4.0–10.5)

## 2017-10-09 LAB — URINALYSIS, ROUTINE W REFLEX MICROSCOPIC
BILIRUBIN URINE: NEGATIVE
GLUCOSE, UA: NEGATIVE mg/dL
Hgb urine dipstick: NEGATIVE
KETONES UR: NEGATIVE mg/dL
LEUKOCYTES UA: NEGATIVE
Nitrite: NEGATIVE
PROTEIN: NEGATIVE mg/dL
Specific Gravity, Urine: 1.006 (ref 1.005–1.030)
pH: 5 (ref 5.0–8.0)

## 2017-10-09 LAB — COMPREHENSIVE METABOLIC PANEL
ALBUMIN: 4 g/dL (ref 3.5–5.0)
ALT: 15 U/L — ABNORMAL LOW (ref 17–63)
ANION GAP: 10 (ref 5–15)
AST: 15 U/L (ref 15–41)
Alkaline Phosphatase: 70 U/L (ref 38–126)
BUN: 21 mg/dL — AB (ref 6–20)
CO2: 27 mmol/L (ref 22–32)
Calcium: 9.7 mg/dL (ref 8.9–10.3)
Chloride: 104 mmol/L (ref 101–111)
Creatinine, Ser: 1.03 mg/dL (ref 0.61–1.24)
GFR calc Af Amer: >60 mL/min (ref >60)
GFR calc non Af Amer: >60 mL/min (ref >60)
GLUCOSE: 124 mg/dL — AB (ref 65–99)
POTASSIUM: 4.5 mmol/L (ref 3.5–5.1)
Sodium: 141 mmol/L (ref 135–145)
Total Bilirubin: 1.9 mg/dL — ABNORMAL HIGH (ref 0.3–1.2)
Total Protein: 7 g/dL (ref 6.5–8.1)

## 2017-10-09 LAB — HEMOGLOBIN A1C
Hgb A1c MFr Bld: 6.6 % — ABNORMAL HIGH (ref 4.8–5.6)
MEAN PLASMA GLUCOSE: 142.72 mg/dL

## 2017-10-09 LAB — PROTIME-INR
INR: 1.01
Prothrombin Time: 13.2 seconds (ref 11.4–15.2)

## 2017-10-09 LAB — GLUCOSE, CAPILLARY
GLUCOSE-CAPILLARY: 110 mg/dL — AB (ref 65–99)
Glucose-Capillary: 103 mg/dL — ABNORMAL HIGH (ref 65–99)
Glucose-Capillary: 136 mg/dL — ABNORMAL HIGH (ref 65–99)
Glucose-Capillary: 96 mg/dL (ref 65–99)

## 2017-10-09 MED ORDER — DIAZEPAM 5 MG PO TABS
5.0000 mg | ORAL_TABLET | Freq: Three times a day (TID) | ORAL | Status: DC | PRN
  Administered 2017-10-09 – 2017-10-10 (×2): 5 mg via ORAL
  Filled 2017-10-09 (×2): qty 1

## 2017-10-09 MED ORDER — SODIUM CHLORIDE 0.9 % IV SOLN
INTRAVENOUS | Status: DC
  Administered 2017-10-09: 15:00:00 via INTRAVENOUS

## 2017-10-09 MED ORDER — INSULIN ASPART 100 UNIT/ML ~~LOC~~ SOLN
0.0000 [IU] | SUBCUTANEOUS | Status: DC
  Administered 2017-10-09: 2 [IU] via SUBCUTANEOUS

## 2017-10-09 MED ORDER — BISACODYL 5 MG PO TBEC: 5.0000 mg | DELAYED_RELEASE_TABLET | Freq: Every day | ORAL | Status: DC | PRN

## 2017-10-09 MED ORDER — METHOCARBAMOL 500 MG PO TABS
500.0000 mg | ORAL_TABLET | Freq: Four times a day (QID) | ORAL | Status: DC | PRN
  Administered 2017-10-09 – 2017-10-12 (×6): 500 mg via ORAL
  Filled 2017-10-09 (×6): qty 1

## 2017-10-09 MED ORDER — COQ10 50 G PO POWD: Freq: Every day | ORAL | Status: DC

## 2017-10-09 MED ORDER — VITAMIN B-12 1000 MCG PO TABS
500.0000 ug | ORAL_TABLET | Freq: Every day | ORAL | Status: DC
  Administered 2017-10-10 – 2017-10-14 (×3): 500 ug via ORAL
  Filled 2017-10-09 (×3): qty 1

## 2017-10-09 MED ORDER — POLYETHYLENE GLYCOL 3350 17 G PO PACK: 17.0000 g | PACK | Freq: Every day | ORAL | Status: DC | PRN

## 2017-10-09 MED ORDER — AMLODIPINE BESYLATE 5 MG PO TABS: 5.0000 mg | ORAL_TABLET | Freq: Every day | ORAL | Status: DC

## 2017-10-09 MED ORDER — ASPIRIN EC 325 MG PO TBEC
325.0000 mg | DELAYED_RELEASE_TABLET | Freq: Every day | ORAL | Status: DC
  Administered 2017-10-09 – 2017-10-11 (×3): 325 mg via ORAL
  Filled 2017-10-09 (×3): qty 1

## 2017-10-09 MED ORDER — DOCUSATE SODIUM 100 MG PO CAPS
100.0000 mg | ORAL_CAPSULE | Freq: Two times a day (BID) | ORAL | Status: DC
Start: 2017-10-09 — End: 2017-10-14
  Administered 2017-10-09 – 2017-10-14 (×8): 100 mg via ORAL
  Filled 2017-10-09 (×9): qty 1

## 2017-10-09 MED ORDER — ASPIRIN EC 81 MG PO TBEC: 81.0000 mg | DELAYED_RELEASE_TABLET | Freq: Every day | ORAL | Status: DC

## 2017-10-09 MED ORDER — METHOCARBAMOL 1000 MG/10ML IJ SOLN
500.0000 mg | Freq: Four times a day (QID) | INTRAVENOUS | Status: DC | PRN
  Filled 2017-10-09: qty 5

## 2017-10-09 MED ORDER — MORPHINE SULFATE (PF) 2 MG/ML IV SOLN
0.5000 mg | INTRAVENOUS | Status: DC | PRN
  Administered 2017-10-09 – 2017-10-10 (×3): 0.5 mg via INTRAVENOUS
  Filled 2017-10-09 (×3): qty 1

## 2017-10-09 MED ORDER — FLEET ENEMA 7-19 GM/118ML RE ENEM: 1.0000 | ENEMA | Freq: Once | RECTAL | Status: DC | PRN

## 2017-10-09 MED ORDER — ATENOLOL 50 MG PO TABS: 50.0000 mg | ORAL_TABLET | Freq: Every day | ORAL | Status: DC

## 2017-10-09 MED ORDER — OXYCODONE HCL 5 MG PO TABS
5.0000 mg | ORAL_TABLET | ORAL | Status: DC | PRN
  Administered 2017-10-09 – 2017-10-12 (×7): 10 mg via ORAL
  Filled 2017-10-09: qty 2
  Filled 2017-10-09: qty 1
  Filled 2017-10-09 (×6): qty 2

## 2017-10-09 NOTE — Patient Instructions (Signed)
Please proceed to the hospital for admission for right lower leg injuries and worsening Lumbar radiculopathy with increasing right quadriceps weakness.  Will plan to order MRI of the lumbar spine with and without contrast and MRI scan of the  Right hip. Will request radiology eval of the right knee replacement radiographs.

## 2017-10-09 NOTE — Progress Notes (Addendum)
Office Visit Note   Patient: Jeffrey Nelson           Date of Birth: 02-16-1935           MRN: [id number] Visit Date: 10/09/2017              Requested by: Oda KiltsBrown, Sherry, MD [address] [place], [place] [address] PCP: Oda KiltsBrown, Sherry, MD   Assessment & Plan: Visit Diagnoses:  1. Acute pain of right knee   2. Fall, initial encounter   3. Pain in right hip   4. Closed fracture of right superior pubic ramus, initial encounter (HCC)   5. Nondisplaced supracondylar fracture with intracondylar extension of lower end of right femur, initial encounter for closed fracture (HCC)   6. Radiculopathy, lumbar region   7. 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 He has had decompressive laminectomies and foramenotomies left side last surgery 12/2013. He has had increasing back right buttock pain and radiation into the right leg since 4 days ago, one day post increased activity and washing and waxing a car. He has had definite change in strength in the right leg quadriceps and right hip flexion. Fell 4 times yesterday. History of stents 02/2017 and right optic nerve stroke 2017. Todays radiographs are suggestive of right superior pubic ramus fracture and possible nondisplaced right supracondylar femur fracture,nondisplaced with well seated TKR. He is having pain unrelieved by percocet and his wife with lumbar condition and total knee replacements is not able to care for him at home. [facility] to undergo a workup of the injuries and MRI of the right hip and lumbar spine. If there is a significant nerve compression will need to consider decompression due to worsening weakness in The right leg causing falls.   Plan: Please proceed to the hospital for admission for right lower leg injuries and worsening Lumbar radiculopathy with increasing right quadriceps weakness.  Will plan to order MRI of the lumbar spine  with and without contrast and MRI scan of the  Right hip. Will request radiology eval of the right knee replacement radiographs.Knee immobilizer for the right knee.  Follow-Up Instructions: Return in about 2 weeks (around 10/23/2017).   Orders:  Orders Placed This Encounter  Procedures  . XR Knee 1-2 Views Right  . XR HIP UNILAT W OR W/O PELVIS 2-3 VIEWS RIGHT   No orders of the defined types were placed in this encounter.     Procedures: No procedures performed   Clinical Data: No additional findings.   Subjective: Chief Complaint  Patient presents with  . Lower Back - Follow-up, Pain, Injury    Had 4 falls yesterday 10/08/2017  . Right Knee - Follow-up, Injury, Pain    83 year old male with a 5 day history of back pain that is worsening, since having washed and waxed a car last Thursday 10/04/2017. Began experiencing pain in the early AM the next day. Seen in the office then and evaluated. Xrays were taken with multilevel degenerative disc narrowing with a minimal anterolisthesis at L3-4 and L4-5. He is having pain into the right posterior hip and pain along the right medial groin down the left anteromedial thigh. He has been experiencing severe pain in the right leg with standing with radiation into the right buttock. No bowel or bladder dysfunction. Previous exam with normal motor and positive  pain with right leg extension. His falls started yesterday when going to sit on the commode and then he got up with assistance and he again fell in the bathroom trying to be seated on the commode. The last fall was in his bedroom when he fell hitting his right knee versus the dresser and then landed with the right knee flexed fully behind him.     Review of Systems  Constitutional: Negative.   HENT: Negative.   Eyes: Negative.   Respiratory: Negative.   Cardiovascular: Negative.   Gastrointestinal: Negative.   Endocrine: Negative.   Genitourinary: Negative.   Musculoskeletal:  Negative.   Skin: Negative.   Allergic/Immunologic: Negative.   Neurological: Negative.   Hematological: Negative.   Psychiatric/Behavioral: Negative.      Objective: Vital Signs: BP (!) 124/54 (BP Location: Left Arm, Patient Position: Sitting)   Pulse (!) 49   Ht 6\' 3"  (1.905 m)   Wt 227 lb (103 kg)   BMI 28.37 kg/m   Physical Exam  Constitutional: He is oriented to person, place, and time. He appears well-developed and well-nourished.  HENT:  Head: Normocephalic and atraumatic.  Eyes: EOM are normal. Pupils are equal, round, and reactive to light.  Neck: Normal range of motion. Neck supple.  Pulmonary/Chest: Effort normal and breath sounds normal.  Abdominal: Soft. Bowel sounds are normal.  Neurological: He is alert and oriented to person, place, and time.  Skin: Skin is warm and dry.  Psychiatric: He has a normal mood and affect. His behavior is normal. Judgment and thought content normal.    Right Hip Exam   Tenderness  The patient is experiencing tenderness in the anterior.   Back Exam   Tenderness  The patient is experiencing tenderness in the lumbar.  Range of Motion  Extension: abnormal  Flexion: abnormal  Lateral bend right: abnormal  Lateral bend left: abnormal  Rotation right: abnormal  Rotation left: abnormal   Muscle Strength  Right Quadriceps:  4/5  Left Quadriceps:  5/5  Right Hamstrings:  5/5  Left Hamstrings:  5/5   Tests  Straight leg raise right: positive Straight leg raise left: negative  Reflexes  Patellar: abnormal Achilles: normal Babinski's sign: normal   Other  Sensation: decreased Gait: antalgic  Erythema: no back redness Scars: present  Comments:  Right TKR with mild periarticular swelling. Able to extend right knee vs gravity, holding the leg up and foot off the floor when the wheel chair is used to get him to the Radiology Room for radiographs, extreme pain with transfers to and from the xray table.        Specialty Comments:  No specialty comments available.  Imaging: Xr Knee 1-2 Views Right  Result Date: 10/09/2017 AP and lateral radiographs of the right knee show a well seated right TKR without signs of lucency or loosening, there is a lucency of the lateral aspect of the distal flare of the metaphysis and medial Condyle no displacement on AP or lateral radiograph.    PMFS History: Patient Active Problem List   Diagnosis Date Noted  . Spinal stenosis in cervical region 05/14/2015    Priority: High    Class: Chronic  . Cubital tunnel syndrome on left 05/14/2015    Priority: High    Class: Chronic  . Spinal stenosis, lumbar region, with neurogenic claudication 01/16/2014    Priority: High    Class: Chronic  . Spondylolisthesis, lumbar region 10/05/2017  . Knee contusion 07/24/2016  . Stenosis of cervical  spine with myelopathy 05/14/2015   Past Medical History:  Diagnosis Date  . Anginal pain (HCC)    occ  . Arthritis   . Bronchitis    hx  . Complication of anesthesia    extremely claustrophobic; pt stated "sometimes I wake up crazy"  . Coronary artery disease   . Diabetes mellitus without complication (HCC)   . History of kidney stones   . HOH (hard of hearing)    wearing hearing aids  . Hypertension   . Myocardial infarction (HCC) 2001   X 3    History reviewed. No pertinent family history.  Past Surgical History:  Procedure Laterality Date  . ANTERIOR CERVICAL DECOMP/DISCECTOMY FUSION N/A 05/14/2015   Procedure: C3-4  ANTERIOR CERVICAL DISCECTOMY AND FUSION WITH PLATE AND SCREWS, LOCAL AND ALLOGRAFT BONE GRAFT;  Surgeon: Kerrin Champagne, MD;  Location: [facility] OR;  Service: Orthopedics;  Laterality: N/A;  . BACK SURGERY    . CARDIAC CATHETERIZATION     X 13  . COLONOSCOPY    . CORONARY ANGIOPLASTY    . CORONARY ARTERY BYPASS GRAFT  01  . EYE SURGERY Bilateral 13   cataracts  . JOINT REPLACEMENT Right 12   knee  . LUMBAR LAMINECTOMY/DECOMPRESSION  MICRODISCECTOMY N/A 01/16/2014   Procedure: Left L3-4 and L4-5 foraminotomies;  Surgeon: Kerrin Champagne, MD;  Location: [facility] OR;  Service: Orthopedics;  Laterality: N/A;  . ULNAR NERVE TRANSPOSITION Left 05/14/2015   Procedure: LEFT ULNAR NERVE DECOMPRESSION AT THE ELBOW;  Surgeon: Kerrin Champagne, MD;  Location: [facility] OR;  Service: Orthopedics;  Laterality: Left;   Social History   Occupational History  . Not on file  Tobacco Use  . Smoking status: Former Smoker    Packs/day: 1.00    Years: 2.00    Pack years: 2.00    Last attempt to quit: 01/09/1957    Years since quitting: 60.7  . Smokeless tobacco: Never Used  Substance and Sexual Activity  . Alcohol use: No  . Drug use: No  . Sexual activity: Not on file

## 2017-10-09 NOTE — Consult Note (Signed)
[facility]                                                                                    Patient Demographics  Jeffrey Nelson, is a 83 y.o. male  CSN: [id number]  MRN: [id number]  DOB - Nov 21, 1934  Admit Date - 10/09/2017  Outpatient Primary MD for the patient is Oda KiltsBrown, Sherry, MD   With History of -  Past Medical History:  Diagnosis Date  . Anginal pain (HCC)    occ  . Arthritis    "joints" (10/09/2017)  . Chronic lower back pain   . Complication of anesthesia    extremely claustrophobic; "have to be put down for MRI; don't sit in back seat of car;, etc."  . Coronary artery disease   . High cholesterol    "get shots twice/month" (10/09/2017)  . History of blood transfusion 2001   "related to CABG"  . History of kidney stones   . HOH (hard of hearing)    wearing hearing aids  . Hypertension   . Myocardial infarction (HCC) 2000s-06/2017 X 4  . Stroke [facility](HCC) ~ 2015   "in my right eye; sight is coming back little by little" (10/09/2017)  . Type II diabetes mellitus (HCC)       Past Surgical History:  Procedure Laterality Date  . ANTERIOR CERVICAL DECOMP/DISCECTOMY FUSION N/A 05/14/2015   Procedure: C3-4  ANTERIOR CERVICAL DISCECTOMY AND FUSION WITH PLATE AND SCREWS, LOCAL AND ALLOGRAFT BONE GRAFT;  Surgeon: Kerrin ChampagneJames E Nitka, MD;  Location: [facility] OR;  Service: Orthopedics;  Laterality: N/A;  . BACK SURGERY    . CARDIAC CATHETERIZATION     X 17 (10/09/2017)  . CATARACT EXTRACTION W/ INTRAOCULAR LENS  IMPLANT, BILATERAL Bilateral   . COLONOSCOPY    . CORONARY ANGIOPLASTY  06/2017  . CORONARY ANGIOPLASTY WITH STENT PLACEMENT     "6 stents total" (10/09/2017)  . CORONARY ARTERY BYPASS GRAFT  2001   CABG X4  . CYSTOSCOPY W/ STONE MANIPULATION    . JOINT REPLACEMENT    . LUMBAR LAMINECTOMY/DECOMPRESSION MICRODISCECTOMY N/A 01/16/2014   Procedure: Left L3-4 and L4-5 foraminotomies;  Surgeon: Kerrin ChampagneJames E Nitka, MD;  [facility];  Service: Orthopedics;  Laterality:  N/A;  . NASAL FRACTURE SURGERY    . NASAL SINUS SURGERY     "several times since 1959"  . SHOULDER OPEN ROTATOR CUFF REPAIR Bilateral   . TOTAL KNEE ARTHROPLASTY Right 2012  . TYMPANOSTOMY TUBE PLACEMENT Bilateral   . ULNAR NERVE TRANSPOSITION Left 05/14/2015   Procedure: LEFT ULNAR NERVE DECOMPRESSION AT THE ELBOW;  Surgeon: Kerrin ChampagneJames E Nitka, MD;  [facility];  Service: Orthopedics;  Laterality: Left;    in for   No chief complaint on file.    HPI  Jeffrey Nelson  is a 83 y.o. male, with past medical history significant for coronary artery disease, diabetes mellitus and hypertension who was in his normal state of mind until last Thursday nights when he started having back pain radiating to the right leg and buttock. Patient denies chest pains, shortness of breath, dizziness, nausea or vomiting. He also denies loss of consciousness. Patient had multiple falls  that seem related to his right leg weakness. Has a history of diabetes mellitus and was recently taken off metformin with a hemoglobin A1c of 6.6. X-rays done at our facility show a questionable fracture of his right superior pubic ramus and degenerative abnormalities of his lumbo sacral spine. He was noted to be slightly bradycardic and we are consulted for medical follow-up. The patient is scheduled to have MRI of the spine and right hip for possible surgery if needed.    Review of Systems    In addition to the HPI above,  No Fever-chills, No Headache, No changes with Vision or hearing, No problems swallowing food or Liquids, No Chest pain, Cough or Shortness of Breath, No Abdominal pain, No Nausea or Vommitting, Bowel movements are regular, No Blood in stool or Urine, No dysuria, No new skin rashes or bruises,  No new weakness, tingling, numbness in any extremity, No recent weight gain or loss, No polyuria, polydypsia or polyphagia, No significant Mental Stressors.  A full 10 point Review of Systems was done, except as  stated above, all other Review of Systems were negative.   Social History Social History   Tobacco Use  . Smoking status: Former Smoker    Packs/day: 1.00    Years: 2.00    Pack years: 2.00    Last attempt to quit: 01/09/1957    Years since quitting: 60.7  . Smokeless tobacco: Never Used  Substance Use Topics  . Alcohol use: No     Family History History reviewed. No pertinent family history.   Prior to Admission medications   Medication Sig Start Date End Date Taking? Authorizing Provider  amLODipine (NORVASC) 5 MG tablet Take 5 mg by mouth daily.   Yes [provider]  aspirin EC 81 MG tablet Take 81 mg by mouth daily.   Yes [provider]  atenolol (TENORMIN) 50 MG tablet Take 50 mg by mouth daily.   Yes [provider]  Coenzyme Q10 (COQ10 PO) Take 1 tablet by mouth daily.   Yes [provider]  Cyanocobalamin (VITAMIN B-12 PO) Take 1 tablet by mouth daily.   Yes [provider]  methylPREDNISolone (MEDROL) 4 MG TBPK tablet Take according to packet instructions 10/05/17  Yes Adonis Huguenin, NP  oxyCODONE-acetaminophen (PERCOCET/ROXICET) 5-325 MG tablet Take 1 tablet by mouth every 6 (six) hours as needed for severe pain. 10/05/17  Yes Adonis Huguenin, NP  zolpidem (AMBIEN) 10 MG tablet Take 1 tablet (10 mg total) by mouth at bedtime as needed for sleep. 07/24/16  Yes Eldred Manges, MD  isosorbide mononitrate (IMDUR) 30 MG 24 hr tablet Take 30 mg by mouth daily. 09/05/17   [provider]  metoprolol tartrate (LOPRESSOR) 50 MG tablet Take 50 mg by mouth 2 (two) times daily. 09/20/17   [provider]    Allergies  Allergen Reactions  . Ezetimibe Other (See Comments)    Cramps  . Morphine Other (See Comments)    Confusion  . Morphine And Related Other (See Comments)    "MAKES ME CRAZY"  . Statins Other (See Comments)    Cramps    Physical Exam  Vitals  Blood pressure (!) 150/60, pulse 61, temperature 98.2 F  (36.8 C), temperature source Oral, resp. rate 17, SpO2 98 %.   1. General well developed, very pleasant male in no acute distress  2. Normal affect and insight, Not Suicidal or Homicidal, Awake Alert, Oriented X 3.  3. No F.N deficits,  patient moving all extremities with normal sensation.  4. Ears and Eyes appear Normal, Conjunctivae clear, PERRLA. Moist Oral Mucosa.  5. Supple Neck, No JVD, No cervical lymphadenopathy appriciated, No Carotid Bruits.  6. Symmetrical Chest wall movement, Good air movement bilaterally, CTAB.  7. RRR, No Gallops, Rubs or Murmurs, No Parasternal Heave.  8. Positive Bowel Sounds, Abdomen Soft, Non tender, No organomegaly appriciated,No rebound -guarding or rigidity.  9.  No Cyanosis, Normal Skin Turgor, No Skin Rash or Bruise.  10. Good muscle tone,  joints appear normal , no edema.    Data Review  CBC Recent Labs  Lab 10/09/17 1226  WBC 13.3*  HGB 12.5*  HCT 37.8*  PLT 203  MCV 98.7  MCH 32.6  MCHC 33.1  RDW 13.4  LYMPHSABS 1.7  MONOABS 1.5*  EOSABS 0.1  BASOSABS 0.0   ------------------------------------------------------------------------------------------------------------------  Chemistries  Recent Labs  Lab 10/09/17 1226  NA 141  K 4.5  CL 104  CO2 27  GLUCOSE 124*  BUN 21*  CREATININE 1.03  CALCIUM 9.7  AST 15  ALT 15*  ALKPHOS 70  BILITOT 1.9*   ------------------------------------------------------------------------------------------------------------------ estimated creatinine clearance is 73.1 mL/min (by C-G formula based on SCr of 1.03 mg/dL). ------------------------------------------------------------------------------------------------------------------ No results for input(s): TSH, T4TOTAL, T3FREE, THYROIDAB in the last 72 hours.  Invalid input(s): FREET3   Coagulation profile Recent Labs  Lab 10/09/17 1226  INR 1.01    ------------------------------------------------------------------------------------------------------------------- No results for input(s): DDIMER in the last 72 hours. -------------------------------------------------------------------------------------------------------------------  Cardiac Enzymes No results for input(s): CKMB, TROPONINI, MYOGLOBIN in the last 168 hours.  Invalid input(s): CK ------------------------------------------------------------------------------------------------------------------ Invalid input(s): POCBNP   ---------------------------------------------------------------------------------------------------------------  Urinalysis    Component Value Date/Time   COLORURINE STRAW (A) 10/09/2017 1218   APPEARANCEUR CLEAR 10/09/2017 1218   LABSPEC 1.006 10/09/2017 1218   PHURINE 5.0 10/09/2017 1218   GLUCOSEU NEGATIVE 10/09/2017 1218   HGBUR NEGATIVE 10/09/2017 1218   BILIRUBINUR NEGATIVE 10/09/2017 1218   KETONESUR NEGATIVE 10/09/2017 1218   PROTEINUR NEGATIVE 10/09/2017 1218   UROBILINOGEN 0.2 05/07/2015 1345   NITRITE NEGATIVE 10/09/2017 1218   LEUKOCYTESUR NEGATIVE 10/09/2017 1218    ----------------------------------------------------------------------------------------------------------------   Imaging results:   Xr Hip Unilat W Or W/o Pelvis 2-3 Views Right  Result Date: 10/09/2017 AP and lateral of the right hip shows right superior pubic ramus with a oblique Lucent line, nondisplaced appearance, the right femoral head and neck is normal and the right greater trochanter and intertrochanteric region without abnormality.   Xr Knee 1-2 Views Right  Result Date: 10/09/2017 AP and lateral radiographs of the right knee show a well seated right TKR without signs of lucency or loosening, there is a lucency of the lateral aspect of the distal flare of the metaphysis and medial Condyle no displacement on AP or lateral radiograph.  Xr Lumbar Spine  2-3 Views  Result Date: 10/05/2017 Radiographs of lumbar spine show grade 1 anteriolisthesis of L3 over L4 and L4 over L5. No acute finding.    My personal review of EKG: Pending  Assessment & Plan   1. Lower back and right hip radiculopathy , 2. Possible superior pubic ramus fracture 3. Bradycardia by vital signs 4. Hypertension 5. Diabetes mellitus with hemoglobin A1c of 6.6 6. Anxiety  Plan  Check EKG Decrease Tenormin Increase Norvasc Check MRI per ortho Start Januvia Ativan IV prior to MRI    DVT Prophylaxis Heparin  AM Labs Ordered, also please review Full Orders  Family Communication: Admission, patients condition and plan  of care including tests being ordered have been discussed with the patient and wife who indicate understanding and agree with the plan and Code Status.  Code Status full  Disposition Plan: Home  Time spent in minutes : 38 minutes  Condition GUARDED   @SIGNATURE @

## 2017-10-09 NOTE — H&P (Signed)
Jeffrey Nelson is an 83 y.o. male.   Chief Complaint:Back and right leg pain with right leg weakness, falls x 4. HPI: 83 year old male with a 5 day history of back pain that is worsening, since having washed and waxed a car last Thursday 10/04/2017. Began experiencing pain in the early AM the next day. Seen in the office then and evaluated. Xrays were taken with multilevel degenerative disc narrowing with a minimal anterolisthesis at L3-4 and L4-5. He is having pain into the right posterior hip and pain along the right medial groin down the left anteromedial thigh. He has been experiencing severe pain in the right leg with standing with radiation into the right buttock. No bowel or bladder dysfunction. Previous exam with normal motor and positive pain with right leg extension. His falls started yesterday when going to sit on the commode and then he got up with assistance and he again fell in the bathroom trying to be seated on the commode. The last fall was in his bedroom when he fell hitting his right knee versus the dresser and then landed with the right knee flexed fully behind him. Total of 4 falls and now with right posterior hip pain, right groin pain and right medial knee pain. Weakness into the right leg prior to his fall with right Sided radicular leg pain.      Past Medical History:  Diagnosis Date  . Anginal pain ([place])    occ  . Arthritis   . Bronchitis    hx  . Complication of anesthesia    extremely claustrophobic; pt stated "sometimes I wake up crazy"  . Coronary artery disease   . Diabetes mellitus without complication ([place])   . History of kidney stones   . HOH (hard of hearing)    wearing hearing aids  . Hypertension   . Myocardial infarction ([place]) 2001   X 3    Past Surgical History:  Procedure Laterality Date  . ANTERIOR CERVICAL DECOMP/DISCECTOMY FUSION N/A 05/14/2015   Procedure: C3-4  ANTERIOR CERVICAL DISCECTOMY AND FUSION WITH PLATE AND SCREWS, LOCAL AND ALLOGRAFT  BONE GRAFT;  Surgeon: Jessy Oto, MD;  Location: [facility];  Service: Orthopedics;  Laterality: N/A;  . BACK SURGERY    . CARDIAC CATHETERIZATION     X 13  . COLONOSCOPY    . CORONARY ANGIOPLASTY    . CORONARY ARTERY BYPASS GRAFT  01  . EYE SURGERY Bilateral 13   cataracts  . JOINT REPLACEMENT Right 12   knee  . LUMBAR LAMINECTOMY/DECOMPRESSION MICRODISCECTOMY N/A 01/16/2014   Procedure: Left L3-4 and L4-5 foraminotomies;  Surgeon: Jessy Oto, MD;  Location: [facility];  Service: Orthopedics;  Laterality: N/A;  . ULNAR NERVE TRANSPOSITION Left 05/14/2015   Procedure: LEFT ULNAR NERVE DECOMPRESSION AT THE ELBOW;  Surgeon: Jessy Oto, MD;  Location: [facility];  Service: Orthopedics;  Laterality: Left;    No family history on file. Social History:  reports that he quit smoking about 60 years ago. He has a 2.00 pack-year smoking history. he has never used smokeless tobacco. He reports that he does not drink alcohol or use drugs.  Allergies:  Allergies  Allergen Reactions  . Ezetimibe Other (See Comments)    Cramps  . Morphine Other (See Comments)    Confusion  . Morphine And Related Other (See Comments)    "MAKES ME CRAZY"  . Statins Other (See Comments)    Cramps    Medications Prior to Admission  Medication Sig Dispense Refill  . amLODipine (NORVASC) 5 MG tablet Take 5 mg by mouth daily.    Marland Kitchen aspirin EC 81 MG tablet Take 81 mg by mouth daily.    Marland Kitchen atenolol (TENORMIN) 50 MG tablet Take 50 mg by mouth daily.    . Coenzyme Q10 (COQ10 PO) Take 1 tablet by mouth daily.    . Cyanocobalamin (VITAMIN B-12 PO) Take 1 tablet by mouth daily.    . methylPREDNISolone (MEDROL) 4 MG TBPK tablet Take according to packet instructions 21 tablet 0  . oxyCODONE-acetaminophen (PERCOCET/ROXICET) 5-325 MG tablet Take 1 tablet by mouth every 6 (six) hours as needed for severe pain. 30 tablet 0  . zolpidem (AMBIEN) 10 MG tablet Take 1 tablet (10 mg total) by mouth at bedtime as needed for sleep. 10 tablet 0     Results for orders placed or performed during the hospital encounter of 10/09/17 (from the past 48 hour(s))  Urinalysis, Routine w reflex microscopic     Status: Abnormal   Collection Time: 10/09/17 12:18 PM  Result Value Ref Range   Color, Urine STRAW (A) YELLOW   APPearance CLEAR CLEAR   Specific Gravity, Urine 1.006 1.005 - 1.030   pH 5.0 5.0 - 8.0   Glucose, UA NEGATIVE NEGATIVE mg/dL   Hgb urine dipstick NEGATIVE NEGATIVE   Bilirubin Urine NEGATIVE NEGATIVE   Ketones, ur NEGATIVE NEGATIVE mg/dL   Protein, ur NEGATIVE NEGATIVE mg/dL   Nitrite NEGATIVE NEGATIVE   Leukocytes, UA NEGATIVE NEGATIVE    Comment: Performed at [facility] [address] [place], [place] [address]  Hemoglobin A1c     Status: Abnormal   Collection Time: 10/09/17 12:26 PM  Result Value Ref Range   Hgb A1c MFr Bld 6.6 (H) 4.8 - 5.6 %    Comment: (NOTE) Pre diabetes:          5.7%-6.4% Diabetes:              >6.4% Glycemic control for   <7.0% adults with diabetes    Mean Plasma Glucose 142.72 mg/dL    Comment: Performed at [facility] [address] [place], [place] [address]  Protime-INR     Status: None   Collection Time: 10/09/17 12:26 PM  Result Value Ref Range   Prothrombin Time 13.2 11.4 - 15.2 seconds   INR 1.01     Comment: Performed at [facility], [place] [address], [place], [place] [address]  CBC WITH DIFFERENTIAL     Status: Abnormal   Collection Time: 10/09/17 12:26 PM  Result Value Ref Range   WBC 13.3 (H) 4.0 - 10.5 K/uL   RBC 3.83 (L) 4.22 - 5.81 MIL/uL   Hemoglobin 12.5 (L) 13.0 - 17.0 g/dL   HCT 37.8 (L) 39.0 - 52.0 %   MCV 98.7 78.0 - 100.0 fL   MCH 32.6 26.0 - 34.0 pg   MCHC 33.1 30.0 - 36.0 g/dL   RDW 13.4 11.5 - 15.5 %   Platelets 203 150 - 400 K/uL   Neutrophils Relative % 75 %   Neutro Abs 10.1 (H) 1.7 - 7.7 K/uL   Lymphocytes Relative 13 %   Lymphs Abs 1.7 0.7 - 4.0 K/uL   Monocytes Relative 11 %   Monocytes Absolute 1.5 (H) 0.1 - 1.0 K/uL    Eosinophils Relative 1 %   Eosinophils Absolute 0.1 0.0 - 0.7 K/uL   Basophils Relative 0 %   Basophils Absolute 0.0 0.0 -  0.1 K/uL    Comment: Performed at [facility], [place] [address], [place], [place] [address]  Comprehensive metabolic panel     Status: Abnormal   Collection Time: 10/09/17 12:26 PM  Result Value Ref Range   Sodium 141 135 - 145 mmol/L   Potassium 4.5 3.5 - 5.1 mmol/L   Chloride 104 101 - 111 mmol/L   CO2 27 22 - 32 mmol/L   Glucose, Bld 124 (H) 65 - 99 mg/dL   BUN 21 (H) 6 - 20 mg/dL   Creatinine, Ser 1.03 0.61 - 1.24 mg/dL   Calcium 9.7 8.9 - 10.3 mg/dL   Total Protein 7.0 6.5 - 8.1 g/dL   Albumin 4.0 3.5 - 5.0 g/dL   AST 15 15 - 41 U/L   ALT 15 (L) 17 - 63 U/L   Alkaline Phosphatase 70 38 - 126 U/L   Total Bilirubin 1.9 (H) 0.3 - 1.2 mg/dL   GFR calc non Af Amer >60 >60 mL/min   GFR calc Af Amer >60 >60 mL/min    Comment: (NOTE) The eGFR has been calculated using the CKD EPI equation. This calculation has not been validated in all clinical situations. eGFR's persistently <60 mL/min signify possible Chronic Kidney Disease.    Anion gap 10 5 - 15    Comment: Performed at [facility] [address] [place], [place] [address]  Glucose, capillary     Status: Abnormal   Collection Time: 10/09/17 12:38 PM  Result Value Ref Range   Glucose-Capillary 103 (H) 65 - 99 mg/dL   Xr Hip Unilat W Or W/o Pelvis 2-3 Views Right  Result Date: 10/09/2017 AP and lateral of the right hip shows right superior pubic ramus with a oblique Lucent line, nondisplaced appearance, the right femoral head and neck is normal and the right greater trochanter and intertrochanteric region without abnormality.   Xr Knee 1-2 Views Right  Result Date: 10/09/2017 AP and lateral radiographs of the right knee show a well seated right TKR without signs of lucency or loosening, there is a lucency of the lateral aspect of the distal flare of the metaphysis and medial Condyle no  displacement on AP or lateral radiograph.   Review of Systems  Constitutional: Negative.   HENT: Negative.   Eyes: Negative.   Respiratory: Negative.   Cardiovascular: Positive for leg swelling. Negative for chest pain, palpitations, orthopnea, claudication and PND.  Gastrointestinal: Negative.   Genitourinary: Negative.   Musculoskeletal: Positive for back pain, falls and joint pain. Negative for myalgias.  Skin: Negative.   Neurological: Positive for tingling and focal weakness. Negative for dizziness, tremors, sensory change, speech change, seizures, loss of consciousness and headaches.  Endo/Heme/Allergies: Negative for environmental allergies and polydipsia. Bruises/bleeds easily.  Psychiatric/Behavioral: Negative.     Blood pressure (!) 143/56, pulse (!) 50, temperature (!) 97.5 F (36.4 C), temperature source Oral, resp. rate 16, SpO2 99 %. Physical Exam  Constitutional: He is oriented to person, place, and time. He appears well-developed and well-nourished. No distress.  HENT:  Head: Normocephalic and atraumatic.  Right Ear: External ear normal.  Left Ear: External ear normal.  Nose: Nose normal.  Mouth/Throat: Oropharynx is clear and moist. No oropharyngeal exudate.  Eyes: Conjunctivae and EOM are normal. Right eye exhibits no discharge. Left eye exhibits no discharge. No scleral icterus.  Neck: Neck supple. No JVD present. No tracheal deviation present. Thyromegaly present.  Cardiovascular: Normal rate, normal heart sounds and intact distal pulses. Exam  reveals no gallop and no friction rub.  No murmur heard. Respiratory: No stridor. No respiratory distress. He has no wheezes. He has no rales. He exhibits no tenderness.  GI: He exhibits no distension and no mass. There is no tenderness. There is no rebound and no guarding.  Musculoskeletal: He exhibits edema and tenderness. He exhibits no deformity.  Lymphadenopathy:    He has no cervical adenopathy.  Neurological: He  is alert and oriented to person, place, and time. He has normal reflexes. He displays normal reflexes. No cranial nerve deficit. He exhibits normal muscle tone. Coordination normal.  Skin: Skin is warm and dry. No rash noted. He is not diaphoretic. No erythema. No pallor.  Psychiatric: He has a normal mood and affect. His behavior is normal. Judgment and thought content normal.     Assessment/Plan Lumbar Radiculopathy with right leg weakness enough to cause him to fall implies quadriceps weakness L3 or L4 level.  Right hip pain plain radiographs are suggestive of a right superior ramus fracture but overlying gas shadows degrade the image. Right medial knee sprain MCL due to hyperflexion injury. History of recent  02/2017 coronary artery stents. Recent adjustments in diabetes medications, metformin was Discontinued and plavix stopped.  Mild anemia Hgb 12.3   Plan: With both lumbar radiculopathy and right leg injuries due to fall this 83 year old male is not able to care for himself and his Wife is not able to care for him. I will admit Mr. Rideaux to [facility] For MRI of his lumbar spine with and without contrast to evaluate for acute lumbar nerve compression with deterioration of motor strength in the right leg. Also MRI the right hip for occult Injury. The right knee will be treated with a knee immobilizer for  2-3 weeks then hinged knee brace with PT to regain right TKR ROM.   Basil Dess, MD 10/09/2017, 1:49 PM

## 2017-10-10 ENCOUNTER — Inpatient Hospital Stay (HOSPITAL_COMMUNITY): Payer: MEDICARE

## 2017-10-10 DIAGNOSIS — R001 Bradycardia, unspecified: Secondary | ICD-10-CM

## 2017-10-10 DIAGNOSIS — M5387 Other specified dorsopathies, lumbosacral region: Secondary | ICD-10-CM | POA: Diagnosis not present

## 2017-10-10 DIAGNOSIS — R296 Repeated falls: Secondary | ICD-10-CM | POA: Diagnosis not present

## 2017-10-10 DIAGNOSIS — M5416 Radiculopathy, lumbar region: Secondary | ICD-10-CM | POA: Diagnosis not present

## 2017-10-10 DIAGNOSIS — M5116 Intervertebral disc disorders with radiculopathy, lumbar region: Secondary | ICD-10-CM | POA: Diagnosis not present

## 2017-10-10 DIAGNOSIS — M25551 Pain in right hip: Secondary | ICD-10-CM | POA: Diagnosis not present

## 2017-10-10 DIAGNOSIS — I2510 Atherosclerotic heart disease of native coronary artery without angina pectoris: Secondary | ICD-10-CM | POA: Diagnosis not present

## 2017-10-10 DIAGNOSIS — Z87442 Personal history of urinary calculi: Secondary | ICD-10-CM | POA: Diagnosis not present

## 2017-10-10 DIAGNOSIS — M549 Dorsalgia, unspecified: Secondary | ICD-10-CM | POA: Diagnosis not present

## 2017-10-10 DIAGNOSIS — E119 Type 2 diabetes mellitus without complications: Secondary | ICD-10-CM | POA: Diagnosis not present

## 2017-10-10 DIAGNOSIS — R29898 Other symptoms and signs involving the musculoskeletal system: Secondary | ICD-10-CM | POA: Diagnosis not present

## 2017-10-10 LAB — CBC
HCT: 38.8 % — ABNORMAL LOW (ref 39.0–52.0)
HEMOGLOBIN: 12.9 g/dL — AB (ref 13.0–17.0)
MCH: 33.2 pg (ref 26.0–34.0)
MCHC: 33.2 g/dL (ref 30.0–36.0)
MCV: 99.7 fL (ref 78.0–100.0)
Platelets: 208 10*3/uL (ref 150–400)
RBC: 3.89 MIL/uL — AB (ref 4.22–5.81)
RDW: 13.8 % (ref 11.5–15.5)
WBC: 13 10*3/uL — AB (ref 4.0–10.5)

## 2017-10-10 LAB — CREATININE, SERUM
CREATININE: 1 mg/dL (ref 0.61–1.24)
GFR calc Af Amer: >60 mL/min (ref >60)
GFR calc non Af Amer: >60 mL/min (ref >60)

## 2017-10-10 LAB — GLUCOSE, CAPILLARY
GLUCOSE-CAPILLARY: 98 mg/dL (ref 65–99)
GLUCOSE-CAPILLARY: 93 mg/dL (ref 65–99)
Glucose-Capillary: 129 mg/dL — ABNORMAL HIGH (ref 65–99)
Glucose-Capillary: 94 mg/dL (ref 65–99)

## 2017-10-10 LAB — VITAMIN D 25 HYDROXY (VIT D DEFICIENCY, FRACTURES): VIT D 25 HYDROXY: 19.2 ng/mL — AB (ref 30.0–100.0)

## 2017-10-10 LAB — TSH: TSH: 1.773 u[IU]/mL (ref 0.350–4.500)

## 2017-10-10 MED ORDER — ATENOLOL 50 MG PO TABS
25.0000 mg | ORAL_TABLET | Freq: Every day | ORAL | Status: DC
  Administered 2017-10-10 – 2017-10-14 (×5): 25 mg via ORAL
  Filled 2017-10-10 (×5): qty 1

## 2017-10-10 MED ORDER — HYDROCODONE-ACETAMINOPHEN 5-325 MG PO TABS: 1.0000 | ORAL_TABLET | ORAL | Status: DC | PRN

## 2017-10-10 MED ORDER — ACETAMINOPHEN 650 MG RE SUPP: 650.0000 mg | Freq: Four times a day (QID) | RECTAL | Status: DC | PRN

## 2017-10-10 MED ORDER — ISOSORBIDE MONONITRATE ER 30 MG PO TB24
30.0000 mg | ORAL_TABLET | Freq: Every day | ORAL | Status: DC
  Administered 2017-10-10 – 2017-10-14 (×3): 30 mg via ORAL
  Filled 2017-10-10 (×3): qty 1

## 2017-10-10 MED ORDER — HEPARIN SODIUM (PORCINE) 5000 UNIT/ML IJ SOLN
5000.0000 [IU] | Freq: Three times a day (TID) | INTRAMUSCULAR | Status: DC
  Administered 2017-10-10 – 2017-10-11 (×5): 5000 [IU] via SUBCUTANEOUS
  Filled 2017-10-10 (×5): qty 1

## 2017-10-10 MED ORDER — LORAZEPAM 2 MG/ML IJ SOLN
1.0000 mg | Freq: Once | INTRAMUSCULAR | Status: AC
  Administered 2017-10-10: 1 mg via INTRAVENOUS
  Filled 2017-10-10: qty 1

## 2017-10-10 MED ORDER — ACETAMINOPHEN 325 MG PO TABS: 650.0000 mg | ORAL_TABLET | Freq: Four times a day (QID) | ORAL | Status: DC | PRN

## 2017-10-10 MED ORDER — INSULIN ASPART 100 UNIT/ML ~~LOC~~ SOLN
0.0000 [IU] | Freq: Three times a day (TID) | SUBCUTANEOUS | Status: DC
  Administered 2017-10-10 – 2017-10-11 (×2): 1 [IU] via SUBCUTANEOUS
  Administered 2017-10-12: 2 [IU] via SUBCUTANEOUS
  Administered 2017-10-13: 1 [IU] via SUBCUTANEOUS

## 2017-10-10 MED ORDER — INSULIN ASPART 100 UNIT/ML ~~LOC~~ SOLN: 0.0000 [IU] | Freq: Every day | SUBCUTANEOUS | Status: DC

## 2017-10-10 MED ORDER — HYDROMORPHONE HCL 1 MG/ML IJ SOLN
0.5000 mg | INTRAMUSCULAR | Status: DC | PRN
  Administered 2017-10-10 – 2017-10-12 (×4): 0.5 mg via INTRAVENOUS
  Filled 2017-10-10 (×5): qty 1

## 2017-10-10 MED ORDER — SODIUM CHLORIDE 0.9% FLUSH
3.0000 mL | Freq: Two times a day (BID) | INTRAVENOUS | Status: DC
  Administered 2017-10-11 – 2017-10-14 (×5): 3 mL via INTRAVENOUS

## 2017-10-10 MED ORDER — SODIUM CHLORIDE 0.9% FLUSH: 3.0000 mL | INTRAVENOUS | Status: DC | PRN

## 2017-10-10 MED ORDER — LIDOCAINE 5 % EX PTCH
1.0000 | MEDICATED_PATCH | CUTANEOUS | Status: DC
  Administered 2017-10-10 – 2017-10-12 (×3): 1 via TRANSDERMAL
  Filled 2017-10-10 (×5): qty 1

## 2017-10-10 MED ORDER — ONDANSETRON HCL 4 MG/2ML IJ SOLN
4.0000 mg | Freq: Four times a day (QID) | INTRAMUSCULAR | Status: DC | PRN
Start: 2017-10-10 — End: 2017-10-14

## 2017-10-10 MED ORDER — LINAGLIPTIN 5 MG PO TABS
5.0000 mg | ORAL_TABLET | Freq: Every day | ORAL | Status: DC
  Administered 2017-10-10 – 2017-10-14 (×3): 5 mg via ORAL
  Filled 2017-10-10 (×3): qty 1

## 2017-10-10 MED ORDER — SODIUM CHLORIDE 0.9 % IV SOLN: 250.0000 mL | INTRAVENOUS | Status: DC | PRN

## 2017-10-10 MED ORDER — ONDANSETRON HCL 4 MG PO TABS
4.0000 mg | ORAL_TABLET | Freq: Four times a day (QID) | ORAL | Status: DC | PRN
Start: 2017-10-10 — End: 2017-10-14

## 2017-10-10 MED ORDER — AMLODIPINE BESYLATE 10 MG PO TABS
10.0000 mg | ORAL_TABLET | Freq: Every day | ORAL | Status: DC
  Administered 2017-10-10 – 2017-10-14 (×3): 10 mg via ORAL
  Filled 2017-10-10 (×3): qty 1

## 2017-10-10 NOTE — Progress Notes (Signed)
PROGRESS NOTE    Jeffrey Nelson  ZOX:[id number] DOB: 1934-09-10 DOA: 10/09/2017 PCP: Oda KiltsBrown, Sherry, MD  Outpatient Specialists:   Brief Narrative: The patient is an 83 year old Caucasian male with past medical history significant for diabetes mellitus, coronary artery disease and hypertension.  Patient was admitted with right hip pain and radiculopathic symptoms.  Bradycardia was initially noted.  Hospitalist service was initially consulted to assist with the bradycardia.  The patient was on a beta-blocker prior to admission, and this has been adjusted accordingly.  Heart rate is better controlled.  Right hip pain and radiculopathy are being worked up, and the orthopedic team is directing the workup.  MRI of the right hip done earlier today revealed osteoarthritis of the right hip.  Will await further workup.  Patient's pain is currently well controlled.  We will also apply a lidocaine patch to the most painful area.  Assessment & Plan:   Principal Problem:   Radiculopathy, lumbar region Active Problems:   Pain in right hip   Right knee sprain   Hip pain, acute, right   Bradycardia   Lumbar radiculopathy pain: Complete workup. Adequate analgesia. Orthopedic input is highly appreciated.  Right hip pain: MRI of the hip revealed osteoarthritis. Continue adequate analgesia.  Coronary artery disease: This is stable. No chest pain.  Diabetes mellitus: Blood sugar ranges from 93-138. Continue current management.  Hypertension: Continue to optimize. Optimization of the patient's pain will also help improve blood pressure control.  Bradycardia Result significant Continue current management.    DVT prophylaxis: Subcu heparin. Code Status: Full Family Communication: Wife and sister-in-law Disposition Plan: This will depend on hospital course   Consultants:   Orthopedic team has seen this patient.  Procedures:   None  Antimicrobials:   None   Subjective: Back  pain and right hip pain reasonably controlled. No fever or chills. No chest pain or shortness of breath  Objective: Vitals:   10/09/17 1146 10/09/17 1958 10/10/17 0413 10/10/17 1521  BP: (!) 143/56 (!) 150/60 (!) 174/72 (!) 115/55  Pulse: (!) 50 61 90 64  Resp: 16 17 20 20   Temp: (!) 97.5 F (36.4 C) 98.2 F (36.8 C) 97.8 F (36.6 C) 98.7 F (37.1 C)  TempSrc: Oral Oral Oral Oral  SpO2: 99% 98% 96% 94%    Intake/Output Summary (Last 24 hours) at 10/10/2017 1744 Last data filed at 10/10/2017 1230 Gross per 24 hour  Intake 480 ml  Output 1325 ml  Net -845 ml   There were no vitals filed for this visit.  Examination:  General exam: Appears calm and comfortable  Respiratory system: Clear to auscultation. Respiratory effort normal. Cardiovascular system: S1 & S2 heard,  No pedal edema. Gastrointestinal system: Abdomen is nondistended, soft and nontender. No organomegaly or masses felt. Normal bowel sounds heard. Central nervous system: Alert and oriented. No focal neurological deficits. Extremities: Symmetric 5 x 5 power.   Data Reviewed: I have personally reviewed following labs and imaging studies  CBC: Recent Labs  Lab 10/09/17 1226 10/10/17 0641  WBC 13.3* 13.0*  NEUTROABS 10.1*  --   HGB 12.5* 12.9*  HCT 37.8* 38.8*  MCV 98.7 99.7  PLT 203 208   Basic Metabolic Panel: Recent Labs  Lab 10/09/17 1226 10/10/17 0641  NA 141  --   K 4.5  --   CL 104  --   CO2 27  --   GLUCOSE 124*  --   BUN 21*  --   CREATININE 1.03 1.00  CALCIUM 9.7  --    GFR: Estimated Creatinine Clearance: 75.3 mL/min (by C-G formula based on SCr of 1 mg/dL). Liver Function Tests: Recent Labs  Lab 10/09/17 1226  AST 15  ALT 15*  ALKPHOS 70  BILITOT 1.9*  PROT 7.0  ALBUMIN 4.0   No results for input(s): LIPASE, AMYLASE in the last 168 hours. No results for input(s): AMMONIA in the last 168 hours. Coagulation Profile: Recent Labs  Lab 10/09/17 1226  INR 1.01   Cardiac  Enzymes: No results for input(s): CKTOTAL, CKMB, CKMBINDEX, TROPONINI in the last 168 hours. BNP (last 3 results) No results for input(s): PROBNP in the last 8760 hours. HbA1C: Recent Labs    10/09/17 1226  HGBA1C 6.6*   CBG: Recent Labs  Lab 10/09/17 2030 10/09/17 2331 10/10/17 0606 10/10/17 1118 10/10/17 1612  GLUCAP 96 110* 98 129* 94   Lipid Profile: No results for input(s): CHOL, HDL, LDLCALC, TRIG, CHOLHDL, LDLDIRECT in the last 72 hours. Thyroid Function Tests: Recent Labs    10/10/17 0641  TSH 1.773   Anemia Panel: No results for input(s): VITAMINB12, FOLATE, FERRITIN, TIBC, IRON, RETICCTPCT in the last 72 hours. Urine analysis:    Component Value Date/Time   COLORURINE STRAW (A) 10/09/2017 1218   APPEARANCEUR CLEAR 10/09/2017 1218   LABSPEC 1.006 10/09/2017 1218   PHURINE 5.0 10/09/2017 1218   GLUCOSEU NEGATIVE 10/09/2017 1218   HGBUR NEGATIVE 10/09/2017 1218   BILIRUBINUR NEGATIVE 10/09/2017 1218   KETONESUR NEGATIVE 10/09/2017 1218   PROTEINUR NEGATIVE 10/09/2017 1218   UROBILINOGEN 0.2 05/07/2015 1345   NITRITE NEGATIVE 10/09/2017 1218   LEUKOCYTESUR NEGATIVE 10/09/2017 1218   Sepsis Labs: @LABRCNTIP (procalcitonin:4,lacticidven:4)  )No results found for this or any previous visit (from the past 240 hour(s)).       Radiology Studies: Mr Hip Right Wo Contrast  Result Date: 10/10/2017 EXAM: MR OF THE RIGHT HIP WITHOUT CONTRAST TECHNIQUE: Multiplanar, multisequence MR imaging was performed. No intravenous contrast was administered. COMPARISON:  None. FINDINGS: Patient motion degrades image quality limiting evaluation. Bones: No hip fracture, dislocation or avascular necrosis. No periosteal reaction or bone destruction. No aggressive osseous lesion. Normal sacrum and sacroiliac joints. No SI joint widening or erosive changes. Degenerative disc disease with disc height loss at L3-4 and L4-5. Articular cartilage and labrum Articular cartilage: High-grade  partial-thickness cartilage loss of the right acetabulum and femoral head. High-grade partial-thickness cartilage loss of the left acetabulum and femoral head. Labrum: Right labral degeneration with a superior right labral tear and a 8 x 20 mm paralabral cyst. Joint or bursal effusion Joint effusion:  No hip joint effusion.  No SI joint effusion. Bursae:  No bursa formation. Muscles and tendons Flexors: Normal. Extensors: Normal. Abductors: Normal. Adductors: Normal. Gluteals: Normal. Hamstrings: Normal. Other findings Miscellaneous: No pelvic free fluid. No fluid collection or hematoma. No inguinal lymphadenopathy. No inguinal hernia. IMPRESSION: 1. Moderate osteoarthritis of the right hip. 2. No hip fracture, dislocation or avascular necrosis. 3. Lower lumbar spine spondylosis. Electronically Signed   By: Elige Ko   On: 10/10/2017 14:45   Xr Hip Unilat W Or W/o Pelvis 2-3 Views Right  Result Date: 10/09/2017 AP and lateral of the right hip shows right superior pubic ramus with a oblique Lucent line, nondisplaced appearance, the right femoral head and neck is normal and the right greater trochanter and intertrochanteric region without abnormality.   Xr Knee 1-2 Views Right  Result Date: 10/09/2017 AP and lateral radiographs of the right knee show a  well seated right TKR without signs of lucency or loosening, there is a lucency of the lateral aspect of the distal flare of the metaphysis and medial Condyle no displacement on AP or lateral radiograph.       Scheduled Meds: . amLODipine  10 mg Oral Daily  . aspirin EC  325 mg Oral Daily  . atenolol  25 mg Oral Daily  . docusate sodium  100 mg Oral BID  . heparin  5,000 Units Subcutaneous Q8H  . insulin aspart  0-5 Units Subcutaneous QHS  . insulin aspart  0-9 Units Subcutaneous TID WC  . isosorbide mononitrate  30 mg Oral Daily  . lidocaine  1 patch Transdermal Q24H  . linagliptin  5 mg Oral Daily  . sodium chloride flush  3 mL Intravenous  Q12H  . vitamin B-12  500 mcg Oral Daily   Continuous Infusions: . sodium chloride 50 mL/hr at 10/09/17 1525  . sodium chloride    . methocarbamol (ROBAXIN)  IV       LOS: 1 day    Time spent: 40 minutes    Berton Mount, MD  [facility] Pager #: [phone] 7PM-7AM contact night coverage as above

## 2017-10-10 NOTE — Progress Notes (Signed)
Physical Therapy Treatment Patient Details Name: Jeffrey Nelson Mcclusky MRN: [id number] DOB: [birth year] Today's Date: 10/10/2017    History of Present Illness 83 y.o. male with 5 day history of back pain that is worsening since washing and waxing his truck and SUV then doing yard work last Thursday 10/04/2017. Currently being worked up for lumbar stenosis vs acute HNP for leg pain. Patient has also fallen 4 times since symptoms began. Scheduled for further imaging tomorrow 2/14.  PMH includes:  DM2, CAD, HTN, MI, Stroke, R TKA, back surgery.     PT Comments     Pt admitted with above diagnosis. Pt currently with functional limitations due to the deficits listed below (see PT Problem List). PTA, patient living with wife in 1 story home with stairs to enter. Pt was independent with all mobility without AD. Upon eval, patient presents with Lumbar, R hip and gluteal pain, as well as R leg weakness that limit his mobility. Patient is min guard level for all mobility, with increasing pain and fatigability with short distance ambulation. Session limited from stair training due to patients pain and dizziness. Advised to utilize RW to maximize safety, and recommending HHPT to home to improve balance and strength. Patient scheduled for further imaging tomorrow and will update recs as appropriate.  Pt will benefit from skilled PT to increase their independence and safety with mobility to allow discharge to the venue listed below.     Follow Up Recommendations  Home health PT;Supervision for mobility/OOB     Equipment Recommendations       Recommendations for Other Services       Precautions / Restrictions Precautions Precautions: Fall Precaution Comments: hx of falls Required Braces or Orthoses: Knee Immobilizer - Right(Patient refuses to wear due to claustrophobia) Restrictions Weight Bearing Restrictions: No    Mobility  Bed Mobility               General bed mobility comments: OOB at  entry  Transfers Overall transfer level: Needs assistance Equipment used: None;Rolling walker (2 wheeled) Transfers: Sit to/from Stand Sit to Stand: Min guard         General transfer comment: Min Guard for safety, patient able to rise without physcial assistance. reports feeling whoozy and dizzy "since he's been taking pain medication" and not only when he stood.   Ambulation/Gait Ambulation/Gait assistance: Min guard Ambulation Distance (Feet): 100 Feet Assistive device: None;Rolling walker (2 wheeled) Gait Pattern/deviations: Step-through pattern;Drifts right/left Gait velocity: decreased   General Gait Details: Mild path deviation, patient able to ambulate without AD, however c/o of hip and glutteal pain.    Stairs            Wheelchair Mobility    Modified Rankin (Stroke Patients Only)       Balance Overall balance assessment: Needs assistance Sitting-balance support: Feet unsupported;No upper extremity supported Sitting balance-Leahy Scale: Good     Standing balance support: During functional activity;Bilateral upper extremity supported Standing balance-Leahy Scale: Fair                              Cognition Arousal/Alertness: Awake/alert Behavior During Therapy: WFL for tasks assessed/performed Overall Cognitive Status: Within Functional Limits for tasks assessed                                        Exercises  General Comments General comments (skin integrity, edema, etc.): Discussed reducing risk of falls with patient and wife, use of RW and benefit of HHPT       Pertinent Vitals/Pain Pain Assessment: Faces Faces Pain Scale: Hurts even more Pain Location: R hip and gluteal area.  Pain Descriptors / Indicators: Discomfort;Aching Pain Intervention(s): Limited activity within patient's tolerance;Monitored during session;Premedicated before session;Repositioned    Home Living Family/patient expects to be  discharged to:: Private residence Living Arrangements: Spouse/significant other Available Help at Discharge: Family;Available 24 hours/day Type of Home: House Home Access: Stairs to enter Entrance Stairs-Rails: None Home Layout: One level Home Equipment: Environmental consultant - 2 wheels;Walker - 4 wheels;Cane - single point      Prior Function Level of Independence: Independent      Comments: Independent without AD, active with yard work and house chores.    PT Goals (current goals can now be found in the care plan section) Acute Rehab PT Goals Patient Stated Goal: return home with HHPT PT Goal Formulation: With patient Time For Goal Achievement: 10/17/17 Potential to Achieve Goals: Fair    Frequency    Min 5X/week      PT Plan      Co-evaluation              AM-PAC PT "6 Clicks" Daily Activity  Outcome Measure  Difficulty turning over in bed (including adjusting bedclothes, sheets and blankets)?: A Little Difficulty moving from lying on back to sitting on the side of the bed? : A Little Difficulty sitting down on and standing up from a chair with arms (e.g., wheelchair, bedside commode, etc,.)?: A Little Help needed moving to and from a bed to chair (including a wheelchair)?: A Little Help needed walking in hospital room?: A Little Help needed climbing 3-5 steps with a railing? : A Little 6 Click Score: 18    End of Session Equipment Utilized During Treatment: Gait belt Activity Tolerance: Patient tolerated treatment well;Patient limited by pain Patient left: in chair;with call bell/phone within reach;with family/visitor present Nurse Communication: Mobility status PT Visit Diagnosis: Unsteadiness on feet (R26.81);Other abnormalities of gait and mobility (R26.89);History of falling (Z91.81);Repeated falls (R29.6);Muscle weakness (generalized) (M62.81);Other symptoms and signs involving the nervous system (W09.811)     Time: 9147-8295 PT Time Calculation (min) (ACUTE  ONLY): 36 min  Charges:  $Gait Training: 8-22 mins                    G Codes:      Etta Grandchild, PT, DPT Acute Rehab Services Pager: [phone]     Etta Grandchild 10/10/2017, 6:01 PM

## 2017-10-10 NOTE — Progress Notes (Signed)
     Subjective:   Right lateral hip pain, inability to stand without severe right buttock and right leg pain. MRI of the right hip is negative. MRI of the lumbar spine not able to be obtained due to severe claustrophobia despite IV ativan and po valium. No able to walk to bathroom. Has been up to the recliner to try and sleep as he is too painful when lying in bed. No incontinence.   Patient reports pain as moderate.    Objective:   VITALS:  Temp:  [97.8 F (36.6 C)-98.7 F (37.1 C)] 98.7 F (37.1 C) (02/13 1521) Pulse Rate:  [61-90] 64 (02/13 1521) Resp:  [17-20] 20 (02/13 1521) BP: (115-174)/(55-72) 115/55 (02/13 1521) SpO2:  [94 %-98 %] 94 % (02/13 1521)  ABD soft Sensation intact distally Intact pulses distally Dorsiflexion/Plantar flexion intact Weakness right knee extension against resistance, able to do a SLR with the right leg vs gravity.  Pain with direct pressure over right  Trochanter and right buttock.  Pain with standing and walking, claudication pattern likely due to lumbar spinal stenosis.  LABS Recent Labs    10/09/17 1226 10/10/17 0641  HGB 12.5* 12.9*  WBC 13.3* 13.0*  PLT 203 208   Recent Labs    10/09/17 1226 10/10/17 0641  NA 141  --   K 4.5  --   CL 104  --   CO2 27  --   BUN 21*  --   CREATININE 1.03 1.00  GLUCOSE 124*  --    Recent Labs    10/09/17 1226  INR 1.01     Assessment/Plan:   Right lumbar radiculopathy Right hip pain, contusion vs lumbar radiculopathy. Right knee sprain, pain level is improved today and he is not tolerating or allowing the use of the knee imobilizer due to  Claustrophobia, also removed the pulse oximeter as part of  An axiiety reaction.    Advance diet Up with therapy  Will schedule for myelogram and post  Myelogram CT of the lumbar spine as he is still unable to be mobilized. Physical therapy to document his level of impairment.No hip fracture.  Will keep until we have imaging of the spine to  determine the  Cause of right leg weakness stenosis vs an acute HNP.   [name] 10/10/2017, 4:19 PMPatient ID: Leafy HalfMelvin W Sliva, male   DOB: 20-Dec-1934, 83 y.o.   MRN: [id number]

## 2017-10-11 ENCOUNTER — Inpatient Hospital Stay (HOSPITAL_COMMUNITY): Payer: MEDICARE

## 2017-10-11 DIAGNOSIS — M5416 Radiculopathy, lumbar region: Secondary | ICD-10-CM | POA: Diagnosis not present

## 2017-10-11 DIAGNOSIS — M5387 Other specified dorsopathies, lumbosacral region: Secondary | ICD-10-CM | POA: Diagnosis not present

## 2017-10-11 DIAGNOSIS — M5116 Intervertebral disc disorders with radiculopathy, lumbar region: Secondary | ICD-10-CM | POA: Diagnosis not present

## 2017-10-11 DIAGNOSIS — R001 Bradycardia, unspecified: Secondary | ICD-10-CM | POA: Diagnosis not present

## 2017-10-11 DIAGNOSIS — M549 Dorsalgia, unspecified: Secondary | ICD-10-CM | POA: Diagnosis not present

## 2017-10-11 DIAGNOSIS — M25551 Pain in right hip: Secondary | ICD-10-CM | POA: Diagnosis not present

## 2017-10-11 LAB — GLUCOSE, CAPILLARY
GLUCOSE-CAPILLARY: 117 mg/dL — AB (ref 65–99)
Glucose-Capillary: 130 mg/dL — ABNORMAL HIGH (ref 65–99)
Glucose-Capillary: 119 mg/dL — ABNORMAL HIGH (ref 65–99)
Glucose-Capillary: 98 mg/dL (ref 65–99)
Glucose-Capillary: 97 mg/dL (ref 65–99)

## 2017-10-11 MED ORDER — LIDOCAINE HCL (PF) 1 % IJ SOLN: 5.0000 mL | Freq: Once | INTRAMUSCULAR | Status: DC

## 2017-10-11 MED ORDER — LIDOCAINE HCL (PF) 1 % IJ SOLN
INTRAMUSCULAR | Status: AC
  Filled 2017-10-11: qty 5

## 2017-10-11 MED ORDER — OXYCODONE HCL ER 10 MG PO T12A
10.0000 mg | EXTENDED_RELEASE_TABLET | Freq: Two times a day (BID) | ORAL | Status: DC
  Administered 2017-10-12 – 2017-10-14 (×3): 10 mg via ORAL
  Filled 2017-10-11 (×3): qty 1

## 2017-10-11 MED ORDER — IOPAMIDOL (ISOVUE-M 200) INJECTION 41%: 20.0000 mL | Freq: Once | INTRAMUSCULAR | Status: DC

## 2017-10-11 MED ORDER — IOPAMIDOL (ISOVUE-M 200) INJECTION 41%
INTRAMUSCULAR | Status: AC
  Filled 2017-10-11: qty 10

## 2017-10-11 MED ORDER — ZOLPIDEM TARTRATE 5 MG PO TABS
5.0000 mg | ORAL_TABLET | Freq: Every evening | ORAL | Status: AC | PRN
Start: 2017-10-11 — End: 2017-10-11
  Administered 2017-10-11: 5 mg via ORAL
  Filled 2017-10-11: qty 1

## 2017-10-11 NOTE — Care Management Note (Addendum)
Case Management Note  Patient Details  Name: Jeffrey Nelson MRN: [id number] Date of Birth: 12-13-1934  Subjective/Objective:   83 yr old with right pubic rami fracture.             Action/Plan: Case manager spoke with patient and wife concerning discharge plan and DME. Choice for Home Health Agency was offered, referral was called to [facility], [facility] Liaison. Patient has RW at home, will have family support at discharge.    Expected Discharge Date:   pending               Expected Discharge Plan:  Home w Home Health Services  In-House Referral:     Discharge planning Services  CM Consult  Post Acute Care Choice:  Home Health Choice offered to:  Patient, Spouse  DME Arranged:  N/A(Has RW) DME Agency:  NA  HH Arranged:  PT HH Agency:  [facility]  Status of Service:  Completed, signed off  If discussed at Long Length of Stay Meetings, dates discussed:    Additional Comments: 10/12/17 Patient needs MRI under General anesthesia, MD will review to determine if he will need surgery.   Durenda GuthrieBrady, Shaune Malacara [name], RN 10/11/2017, 2:22 PM

## 2017-10-11 NOTE — Progress Notes (Signed)
PT Cancellation Note  Patient Details Name: Jeffrey Nelson MRN: [id number] DOB: 1935-02-05   Cancelled Treatment:    Reason Eval/Treat Not Completed: Pain limiting ability to participate;Patient declined, no reason specified patient refusing therapy citing he is in too much pain and just got back into bed. Family visiting and celebrating wives birthday as well. Will re-attempt tomorrow 1/15.   Etta GrandchildSean Tritia Endo, PT, DPT Acute Rehab Services Pager: [phone]     Etta GrandchildSean  Jeffrey Nelson 10/11/2017, 6:31 PM

## 2017-10-11 NOTE — Plan of Care (Signed)
  Education: Knowledge of General Education information will improve 10/11/2017 0533 - Progressing by Olena Materobinson, Nemesio Castrillon G, RN Note POC reviewed with pt.; pt. little anxious at the beginning of the shift and calmed down.

## 2017-10-11 NOTE — Progress Notes (Signed)
[facility] notified that patient is requesting Ambien 5 mg for sleep to be added to his MAR. Ilean SkillVeronica Gerrianne Aydelott LPN

## 2017-10-11 NOTE — Progress Notes (Signed)
PROGRESS NOTE    Jeffrey Nelson  NFA:[id number] DOB: 04-30-1935 DOA: 10/09/2017 PCP: Oda Kilts, MD  Outpatient Specialists:   Brief Narrative: The patient is an 83 year old Caucasian male with past medical history significant for diabetes mellitus, coronary artery disease and hypertension.  Patient was admitted with right hip pain and radiculopathic symptoms.  Bradycardia was initially noted.  Hospitalist service was initially consulted to assist with the bradycardia.  The patient was on a beta-blocker prior to admission, and this has been adjusted accordingly.  Heart rate is better controlled.  Right hip pain and radiculopathy are being worked up, and the orthopedic team is directing the workup.  MRI of the right hip done earlier today revealed osteoarthritis of the right hip.  Will await further workup.    10/11/2017-patient continues to report right leg pain after activity.  Myelogram was consulted by radiology earlier today as the patient was on Plavix.  Discussed extensively with the patient and the patient's wife.  Perhaps, we will try to pursue MRI of the lumbar spine with and without contrast as originally planned by orthopedic team if possible.  Orthopedic input is highly appreciated as well.  Will start patient on long acting pain medication.  Assessment & Plan:   Principal Problem:   Radiculopathy, lumbar region Active Problems:   Pain in right hip   Right knee sprain   Hip pain, acute, right   Bradycardia   Lumbar radiculopathy pain: -Myelogram was consulted today by the radiology team as the patient was on Plavix. .-We will pursue MRI of the lumbar spine with and without contrast as originally planned by orthopedic team if possible. - Adequate analgesia.  Will start patient on oxycodone ER 10 mg p.o. twice daily, and adjust dose as tolerated. -Continue other pain medications. - Orthopedic input is highly appreciated.  Right hip pain: MRI of the hip revealed  osteoarthritis.  No fractures. Continue adequate analgesia.  Coronary artery disease: This is stable. No chest pain.  Diabetes mellitus: Blood sugar ranges from 93-138. Continue current management.  Hypertension: Continue to optimize. Optimization of the patient's pain will also help improve blood pressure control.  Bradycardia Result significant Continue current management.  I have personally reviewed patient's lab and imaging studies.  Patient remains too unstable to be discharged back home.  DVT prophylaxis: Subcu heparin. Code Status: Full Family Communication: Wife and sister-in-law Disposition Plan: This will depend on hospital course   Consultants:   Orthopedics.  Procedures:   None  Antimicrobials:   None   Subjective: Back pain and right hip pain persist.   No fever or chills. No chest pain or shortness of breath  Objective: Vitals:   10/10/17 0413 10/10/17 1521 10/10/17 2102 10/11/17 0536  BP: (!) 174/72 (!) 115/55 (!) 181/79 114/86  Pulse: 90 64 65 71  Resp: 20 20 18 16   Temp: 97.8 F (36.6 C) 98.7 F (37.1 C) 98.6 F (37 C) 98.2 F (36.8 C)  TempSrc: Oral Oral Oral Oral  SpO2: 96% 94% 99% 95%    Intake/Output Summary (Last 24 hours) at 10/11/2017 1441 Last data filed at 10/11/2017 1000 Gross per 24 hour  Intake 860 ml  Output 1000 ml  Net -140 ml   There were no vitals filed for this visit.  Examination:  General exam: Appears calm and comfortable  Respiratory system: Clear to auscultation. Respiratory effort normal. Cardiovascular system: S1 & S2 heard,  No pedal edema. Gastrointestinal system: Abdomen is nondistended, soft and nontender. No organomegaly  or masses felt. Normal bowel sounds heard. Central nervous system: Alert and oriented. No focal neurological deficits. Extremities: Symmetric 5 x 5 power.   Data Reviewed: I have personally reviewed following labs and imaging studies  CBC: Recent Labs  Lab 10/09/17 1226  10/10/17 0641  WBC 13.3* 13.0*  NEUTROABS 10.1*  --   HGB 12.5* 12.9*  HCT 37.8* 38.8*  MCV 98.7 99.7  PLT 203 208   Basic Metabolic Panel: Recent Labs  Lab 10/09/17 1226 10/10/17 0641  NA 141  --   K 4.5  --   CL 104  --   CO2 27  --   GLUCOSE 124*  --   BUN 21*  --   CREATININE 1.03 1.00  CALCIUM 9.7  --    GFR: Estimated Creatinine Clearance: 75.3 mL/min (by C-G formula based on SCr of 1 mg/dL). Liver Function Tests: Recent Labs  Lab 10/09/17 1226  AST 15  ALT 15*  ALKPHOS 70  BILITOT 1.9*  PROT 7.0  ALBUMIN 4.0   No results for input(s): LIPASE, AMYLASE in the last 168 hours. No results for input(s): AMMONIA in the last 168 hours. Coagulation Profile: Recent Labs  Lab 10/09/17 1226  INR 1.01   Cardiac Enzymes: No results for input(s): CKTOTAL, CKMB, CKMBINDEX, TROPONINI in the last 168 hours. BNP (last 3 results) No results for input(s): PROBNP in the last 8760 hours. HbA1C: Recent Labs    10/09/17 1226  HGBA1C 6.6*   CBG: Recent Labs  Lab 10/10/17 1118 10/10/17 1612 10/10/17 2105 10/11/17 0709 10/11/17 1125  GLUCAP 129* 94 93 117* 130*   Lipid Profile: No results for input(s): CHOL, HDL, LDLCALC, TRIG, CHOLHDL, LDLDIRECT in the last 72 hours. Thyroid Function Tests: Recent Labs    10/10/17 0641  TSH 1.773   Anemia Panel: No results for input(s): VITAMINB12, FOLATE, FERRITIN, TIBC, IRON, RETICCTPCT in the last 72 hours. Urine analysis:    Component Value Date/Time   COLORURINE STRAW (A) 10/09/2017 1218   APPEARANCEUR CLEAR 10/09/2017 1218   LABSPEC 1.006 10/09/2017 1218   PHURINE 5.0 10/09/2017 1218   GLUCOSEU NEGATIVE 10/09/2017 1218   HGBUR NEGATIVE 10/09/2017 1218   BILIRUBINUR NEGATIVE 10/09/2017 1218   KETONESUR NEGATIVE 10/09/2017 1218   PROTEINUR NEGATIVE 10/09/2017 1218   UROBILINOGEN 0.2 05/07/2015 1345   NITRITE NEGATIVE 10/09/2017 1218   LEUKOCYTESUR NEGATIVE 10/09/2017 1218   Sepsis  Labs: @LABRCNTIP (procalcitonin:4,lacticidven:4)  )No results found for this or any previous visit (from the past 240 hour(s)).       Radiology Studies: Mr Hip Right Wo Contrast  Result Date: 10/10/2017 EXAM: MR OF THE RIGHT HIP WITHOUT CONTRAST TECHNIQUE: Multiplanar, multisequence MR imaging was performed. No intravenous contrast was administered. COMPARISON:  None. FINDINGS: Patient motion degrades image quality limiting evaluation. Bones: No hip fracture, dislocation or avascular necrosis. No periosteal reaction or bone destruction. No aggressive osseous lesion. Normal sacrum and sacroiliac joints. No SI joint widening or erosive changes. Degenerative disc disease with disc height loss at L3-4 and L4-5. Articular cartilage and labrum Articular cartilage: High-grade partial-thickness cartilage loss of the right acetabulum and femoral head. High-grade partial-thickness cartilage loss of the left acetabulum and femoral head. Labrum: Right labral degeneration with a superior right labral tear and a 8 x 20 mm paralabral cyst. Joint or bursal effusion Joint effusion:  No hip joint effusion.  No SI joint effusion. Bursae:  No bursa formation. Muscles and tendons Flexors: Normal. Extensors: Normal. Abductors: Normal. Adductors: Normal. Gluteals: Normal. Hamstrings: Normal.  Other findings Miscellaneous: No pelvic free fluid. No fluid collection or hematoma. No inguinal lymphadenopathy. No inguinal hernia. IMPRESSION: 1. Moderate osteoarthritis of the right hip. 2. No hip fracture, dislocation or avascular necrosis. 3. Lower lumbar spine spondylosis. Electronically Signed   By: Elige KoHetal  Patel   On: 10/10/2017 14:45        Scheduled Meds: . amLODipine  10 mg Oral Daily  . aspirin EC  325 mg Oral Daily  . atenolol  25 mg Oral Daily  . docusate sodium  100 mg Oral BID  . heparin  5,000 Units Subcutaneous Q8H  . insulin aspart  0-5 Units Subcutaneous QHS  . insulin aspart  0-9 Units Subcutaneous TID WC   . iopamidol  20 mL Intrathecal Once  . iopamidol      . isosorbide mononitrate  30 mg Oral Daily  . lidocaine  1 patch Transdermal Q24H  . lidocaine (PF)  5 mL Intradermal Once  . lidocaine (PF)      . linagliptin  5 mg Oral Daily  . sodium chloride flush  3 mL Intravenous Q12H  . vitamin B-12  500 mcg Oral Daily   Continuous Infusions: . sodium chloride 50 mL/hr at 10/09/17 1525  . sodium chloride    . methocarbamol (ROBAXIN)  IV       LOS: 2 days    Time spent: 40 minutes    Berton MountSylvester Antoinett Dorman, MD  [facility] Pager #: [phone] 7PM-7AM contact night coverage as above

## 2017-10-11 NOTE — Progress Notes (Signed)
Patient ID: Jeffrey Nelson, male   DOB: 10-29-1934, 83 y.o.   MRN: [id number]     Subjective: I attempted to schedule a myelogram but apparently the study was cancelled by radiology, there is no note or explaination and I suspect that it is due to his being on aspirin or receiving heparin per medicine for anti DVT prophylaxis. In either case I was not aware of the cancellation, there is no note in chart concerning cancellation or reason.      Patient reports pain as moderate.    Objective:   VITALS:  Temp:  [98.2 F (36.8 C)-98.6 F (37 C)] 98.3 F (36.8 C) (02/14 1443) Pulse Rate:  [65-71] 65 (02/14 1443) Resp:  [16-18] 16 (02/14 1443) BP: (114-181)/(64-86) 134/64 (02/14 1443) SpO2:  [95 %-99 %] 96 % (02/14 1443)  ABD soft Intact pulses distally Dorsiflexion/Plantar flexion intact Compartment soft right quadriceps weakness 4/5, severe pain with standing and walking.    LABS Recent Labs    10/09/17 1226 10/10/17 0641  HGB 12.5* 12.9*  WBC 13.3* 13.0*  PLT 203 208   Recent Labs    10/09/17 1226 10/10/17 0641  NA 141  --   K 4.5  --   CL 104  --   CO2 27  --   BUN 21*  --   CREATININE 1.03 1.00  GLUCOSE 124*  --    Recent Labs    10/09/17 1226  INR 1.01     Assessment/Plan: Neurogenic Claudication with right leg weakness and falls. Not safe to go home, continuing evaluation of spine for right L3 or L4 nerve compression. CAD Right knee sprain improving.      Up with therapy  Apparently myelogram is cancelled by radiology, still no note. I will try to schedule for a MRI of the lumbar spine under a general anesthesia as he reports he is too painful to lie still for the MRI of the lumbar spine and is having severe claustrophobia. NPO past midnight Discontinue heparin and aspirin.   Vira BrownsJames Damaria Stofko 10/11/2017, 7:56 PM

## 2017-10-11 NOTE — Evaluation (Signed)
Occupational Therapy Evaluation Patient Details Name: Jeffrey Nelson MRN: [id number] DOB: August 28, 1935 Today's Date: 10/11/2017    History of Present Illness 83 y.o. male with 5 day history of back pain that is worsening since washing and waxing his truck and SUV last Thursday 10/04/2017. Currently being worked up for lumbar stenosis vs acute HNP for leg pain. Patient has also fallen 4 times since symptoms began. Scheduled for further imaging tomorrow 2/14.  PMH includes:  DM2, CAD, HTN, MI, Stroke, R TKA, back surgery.    Clinical Impression   Pt admitted with the above diagnoses and presents with below problem list. Pt will benefit from continued acute OT to address the below listed deficits and maximize independence with basic ADLs prior to d/c home. PTA pt was mostly independent with ADLs. Pt is currently min guard-min A for LB ADLs, min guard for functional mobility/transfers. Limited OT eval, pt on his way back to bed from bathroom, declined further OOB tasks at this time due to pain/fatigue. Began education on ADL strategies with pt with spouse present.       Follow Up Recommendations  Supervision/Assistance - 24 hour;Home health OT    Equipment Recommendations  None recommended by OT    Recommendations for Other Services       Precautions / Restrictions Precautions Precautions: Fall Precaution Comments: hx of falls Required Braces or Orthoses: Knee Immobilizer - Right(Patient refuses to wear due to claustrophobia) Restrictions Weight Bearing Restrictions: No      Mobility Bed Mobility Overal bed mobility: Needs Assistance Bed Mobility: Sit to Supine       Sit to supine: Min guard;HOB elevated   General bed mobility comments: Discussed logroll trechnique for comfort.   Transfers Overall transfer level: Needs assistance Equipment used: Rolling walker (2 wheeled) Transfers: Sit to/from Stand Sit to Stand: Min guard         General transfer comment: to EOB  position    Balance Overall balance assessment: Needs assistance Sitting-balance support: Feet unsupported;No upper extremity supported Sitting balance-Leahy Scale: Good     Standing balance support: During functional activity;Bilateral upper extremity supported Standing balance-Leahy Scale: Fair                             ADL either performed or assessed with clinical judgement   ADL Overall ADL's : Needs assistance/impaired Eating/Feeding: Set up;Sitting   Grooming: Set up;Sitting   Upper Body Bathing: Set up;Sitting   Lower Body Bathing: Min guard;Sit to/from stand;Minimal assistance   Upper Body Dressing : Set up;Sitting   Lower Body Dressing: Min guard;Sit to/from stand;Minimal assistance   Toilet Transfer: Min guard;Ambulation;RW   Toileting- ArchitectClothing Manipulation and Hygiene: Min guard;Sit to/from stand Toileting - Clothing Manipulation Details (indicate cue type and reason): discussed AE to avoid twisting for comfort Tub/ Shower Transfer: Min guard;Ambulation;Shower seat;Rolling walker   Functional mobility during ADLs: Min guard;Rolling walker General ADL Comments: Pt returning from bathroom on OT arrival. Declined further OOB activity. Pt completed bed mobility. Discussed compensation strategies for LB/OOB ALDs. Spouse present.     Vision Baseline Vision/History: Wears glasses       Perception     Praxis      Pertinent Vitals/Pain Pain Assessment: Faces Faces Pain Scale: Hurts even more Pain Location: R hip and gluteal area.  Pain Descriptors / Indicators: Discomfort;Aching Pain Intervention(s): Limited activity within patient's tolerance;Monitored during session;Repositioned     Hand Dominance  Extremity/Trunk Assessment Upper Extremity Assessment Upper Extremity Assessment: Overall WFL for tasks assessed   Lower Extremity Assessment Lower Extremity Assessment: Defer to PT evaluation       Communication  Communication Communication: HOH   Cognition Arousal/Alertness: Awake/alert Behavior During Therapy: WFL for tasks assessed/performed Overall Cognitive Status: Within Functional Limits for tasks assessed                                     General Comments       Exercises     Shoulder Instructions      Home Living Family/patient expects to be discharged to:: Private residence Living Arrangements: Spouse/significant other Available Help at Discharge: Family;Available 24 hours/day Type of Home: House Home Access: Stairs to enter [employer] of Steps: 5 Entrance Stairs-Rails: None Home Layout: One level     Bathroom Shower/Tub: Chief Strategy Officer: Standard Bathroom Accessibility: Yes   Home Equipment: Environmental consultant - 2 wheels;Walker - 4 wheels;Cane - single point          Prior Functioning/Environment Level of Independence: Independent        Comments: Independent without AD, active with yard work and house chores.         OT Problem List: Decreased activity tolerance;Impaired balance (sitting and/or standing);Decreased knowledge of use of DME or AE;Decreased knowledge of precautions;Pain      OT Treatment/Interventions: Self-care/ADL training;DME and/or AE instruction;Therapeutic activities;Patient/family education;Balance training    OT Goals(Current goals can be found in the care plan section) Acute Rehab OT Goals Patient Stated Goal: return home with HHPT OT Goal Formulation: With patient/family Time For Goal Achievement: 10/25/17 Potential to Achieve Goals: Good ADL Goals Pt Will Perform Lower Body Bathing: with modified independence;sit to/from stand Pt Will Perform Lower Body Dressing: with modified independence;sit to/from stand Pt Will Transfer to Toilet: with modified independence;ambulating Pt Will Perform Toileting - Clothing Manipulation and hygiene: with modified independence;sit to/from stand Pt Will Perform  Tub/Shower Transfer: with supervision;ambulating;rolling walker  OT Frequency: Min 2X/week   Barriers to D/C:            Co-evaluation              AM-PAC PT "6 Clicks" Daily Activity     Outcome Measure Help from another person eating meals?: None Help from another person taking care of personal grooming?: None Help from another person toileting, which includes using toliet, bedpan, or urinal?: A Little Help from another person bathing (including washing, rinsing, drying)?: A Little Help from another person to put on and taking off regular upper body clothing?: None Help from another person to put on and taking off regular lower body clothing?: A Little 6 Click Score: 21   End of Session Equipment Utilized During Treatment: Rolling walker  Activity Tolerance: Patient limited by pain;Patient limited by fatigue Patient left: in bed;with call bell/phone within reach;with family/visitor present  OT Visit Diagnosis: Other abnormalities of gait and mobility (R26.89);Pain;History of falling (Z91.81) Pain - Right/Left: Right Pain - part of body: Leg                Time: 1016-1030 OT Time Calculation (min): 14 min Charges:  OT General Charges $OT Visit: 1 Visit OT Evaluation $OT Eval Low Complexity: 1 Low G-Codes:       Pilar Grammes 10/11/2017, 10:54 AM

## 2017-10-12 ENCOUNTER — Inpatient Hospital Stay (HOSPITAL_COMMUNITY): Payer: MEDICARE | Admitting: Certified Registered Nurse Anesthetist

## 2017-10-12 ENCOUNTER — Encounter (HOSPITAL_COMMUNITY)
Admission: AD | Disposition: A | Discharge status: Home Health | Payer: Self-pay | Source: Ambulatory Visit | Attending: Internal Medicine

## 2017-10-12 ENCOUNTER — Inpatient Hospital Stay (HOSPITAL_COMMUNITY): Payer: MEDICARE

## 2017-10-12 ENCOUNTER — Encounter (HOSPITAL_COMMUNITY): Payer: Self-pay | Admitting: Certified Registered"

## 2017-10-12 ENCOUNTER — Encounter (HOSPITAL_COMMUNITY): Payer: Self-pay | Admitting: Certified Registered Nurse Anesthetist

## 2017-10-12 DIAGNOSIS — M5116 Intervertebral disc disorders with radiculopathy, lumbar region: Secondary | ICD-10-CM | POA: Diagnosis not present

## 2017-10-12 DIAGNOSIS — M5416 Radiculopathy, lumbar region: Secondary | ICD-10-CM | POA: Diagnosis not present

## 2017-10-12 DIAGNOSIS — M549 Dorsalgia, unspecified: Secondary | ICD-10-CM | POA: Diagnosis not present

## 2017-10-12 DIAGNOSIS — R29898 Other symptoms and signs involving the musculoskeletal system: Secondary | ICD-10-CM

## 2017-10-12 DIAGNOSIS — S83411A Sprain of medial collateral ligament of right knee, initial encounter: Secondary | ICD-10-CM | POA: Diagnosis not present

## 2017-10-12 DIAGNOSIS — M25551 Pain in right hip: Secondary | ICD-10-CM | POA: Diagnosis not present

## 2017-10-12 DIAGNOSIS — M5387 Other specified dorsopathies, lumbosacral region: Secondary | ICD-10-CM

## 2017-10-12 DIAGNOSIS — R296 Repeated falls: Secondary | ICD-10-CM | POA: Diagnosis not present

## 2017-10-12 DIAGNOSIS — R001 Bradycardia, unspecified: Secondary | ICD-10-CM | POA: Diagnosis not present

## 2017-10-12 DIAGNOSIS — Z87442 Personal history of urinary calculi: Secondary | ICD-10-CM | POA: Diagnosis not present

## 2017-10-12 DIAGNOSIS — I2510 Atherosclerotic heart disease of native coronary artery without angina pectoris: Secondary | ICD-10-CM | POA: Diagnosis not present

## 2017-10-12 HISTORY — PX: RADIOLOGY WITH ANESTHESIA: SHX6223

## 2017-10-12 LAB — GLUCOSE, CAPILLARY
GLUCOSE-CAPILLARY: 118 mg/dL — AB (ref 65–99)
GLUCOSE-CAPILLARY: 102 mg/dL — AB (ref 65–99)
GLUCOSE-CAPILLARY: 190 mg/dL — AB (ref 65–99)
Glucose-Capillary: 158 mg/dL — ABNORMAL HIGH (ref 65–99)

## 2017-10-12 SURGERY — MRI WITH ANESTHESIA
Anesthesia: General

## 2017-10-12 MED ORDER — EPHEDRINE SULFATE-NACL 50-0.9 MG/10ML-% IV SOSY
PREFILLED_SYRINGE | INTRAVENOUS | Status: DC | PRN
  Administered 2017-10-12 (×5): 10 mg via INTRAVENOUS

## 2017-10-12 MED ORDER — LIDOCAINE 2% (20 MG/ML) 5 ML SYRINGE
INTRAMUSCULAR | Status: DC | PRN
  Administered 2017-10-12: 100 mg via INTRAVENOUS

## 2017-10-12 MED ORDER — ONDANSETRON HCL 4 MG/2ML IJ SOLN
INTRAMUSCULAR | Status: DC | PRN
  Administered 2017-10-12: 4 mg via INTRAVENOUS

## 2017-10-12 MED ORDER — CHLORHEXIDINE GLUCONATE 4 % EX LIQD
60.0000 mL | Freq: Once | CUTANEOUS | Status: AC
  Administered 2017-10-13: 4 via TOPICAL
  Filled 2017-10-12: qty 60

## 2017-10-12 MED ORDER — FENTANYL CITRATE (PF) 250 MCG/5ML IJ SOLN
INTRAMUSCULAR | Status: AC
Start: 2017-10-12 — End: 2017-10-12
  Filled 2017-10-12: qty 5

## 2017-10-12 MED ORDER — SODIUM CHLORIDE 0.9 % IV SOLN
INTRAVENOUS | Status: DC
  Administered 2017-10-12: 75 mL/h via INTRAVENOUS

## 2017-10-12 MED ORDER — LACTATED RINGERS IV SOLN
INTRAVENOUS | Status: DC
  Administered 2017-10-12: 13:00:00 via INTRAVENOUS

## 2017-10-12 MED ORDER — DEXAMETHASONE SODIUM PHOSPHATE 4 MG/ML IJ SOLN
INTRAMUSCULAR | Status: DC | PRN
  Administered 2017-10-12: 10 mg via INTRAVENOUS

## 2017-10-12 MED ORDER — PROPOFOL 10 MG/ML IV BOLUS
INTRAVENOUS | Status: DC | PRN
  Administered 2017-10-12: 120 mg via INTRAVENOUS

## 2017-10-12 MED ORDER — GADOBENATE DIMEGLUMINE 529 MG/ML IV SOLN
20.0000 mL | Freq: Once | INTRAVENOUS | Status: AC
  Administered 2017-10-12: 20 mL via INTRAVENOUS

## 2017-10-12 MED ORDER — CEFAZOLIN SODIUM-DEXTROSE 2-4 GM/100ML-% IV SOLN
2.0000 g | INTRAVENOUS | Status: AC
  Administered 2017-10-13: 2 g via INTRAVENOUS
  Filled 2017-10-12 (×2): qty 100

## 2017-10-12 MED ORDER — ZOLPIDEM TARTRATE 5 MG PO TABS
5.0000 mg | ORAL_TABLET | Freq: Once | ORAL | Status: AC
  Administered 2017-10-12: 5 mg via ORAL
  Filled 2017-10-12 (×2): qty 1

## 2017-10-12 MED ORDER — PHENYLEPHRINE HCL 10 MG/ML IJ SOLN
INTRAMUSCULAR | Status: DC | PRN
  Administered 2017-10-12: 20 ug/min via INTRAVENOUS

## 2017-10-12 MED ORDER — HYDROMORPHONE HCL 1 MG/ML IJ SOLN
0.5000 mg | Freq: Once | INTRAMUSCULAR | Status: AC
  Administered 2017-10-12: 0.5 mg via INTRAVENOUS

## 2017-10-12 NOTE — Anesthesia Preprocedure Evaluation (Signed)
Anesthesia Evaluation  Patient identified by MRN, date of birth, ID band Patient awake    Reviewed: Allergy & Precautions, NPO status , Patient's Chart, lab work & pertinent test results  Airway Mallampati: II  TM Distance: >3 FB Neck ROM: Full    Dental no notable dental hx.    Pulmonary former smoker,    Pulmonary exam normal breath sounds clear to auscultation       Cardiovascular hypertension, + CAD, + Past MI, + Cardiac Stents and + CABG  Normal cardiovascular exam Rhythm:Regular Rate:Normal     Neuro/Psych CVA, Residual Symptoms negative psych ROS   GI/Hepatic negative GI ROS, Neg liver ROS,   Endo/Other  diabetes, Type 2  Renal/GU negative Renal ROS  negative genitourinary   Musculoskeletal negative musculoskeletal ROS (+)   Abdominal   Peds negative pediatric ROS (+)  Hematology negative hematology ROS (+)   Anesthesia Other Findings   Reproductive/Obstetrics negative OB ROS                             Anesthesia Physical Anesthesia Plan  ASA: II  Anesthesia Plan: General   Post-op Pain Management:    Induction: Intravenous  PONV Risk Score and Plan: 2 and Treatment may vary due to age or medical condition  Airway Management Planned: LMA  Additional Equipment:   Intra-op Plan:   Post-operative Plan: Extubation in OR  Informed Consent: I have reviewed the patients History and Physical, chart, labs and discussed the procedure including the risks, benefits and alternatives for the proposed anesthesia with the patient or authorized representative who has indicated his/her understanding and acceptance.   Dental advisory given  Plan Discussed with: CRNA  Anesthesia Plan Comments:         Anesthesia Quick Evaluation

## 2017-10-12 NOTE — Progress Notes (Signed)
OT Cancellation Note  Patient Details Name: Leafy HalfMelvin W Kamphaus MRN: [id number] DOB: 1935/07/26   Cancelled Treatment:    Reason Eval/Treat Not Completed: Other (comment).  Attempted skilled OT with pt. He politely declined stating he was in great pain and had just found relief laying down.  States MRI scheduled for 1pm with possible sx.  Wants to wait for results and possible sx. Prior to proceeding with skilled therapies.  Will check back once results available and pt. Able to tolerate participation.    Robet LeuMorris, Wade Sigala Lorraine, COTA/L 10/12/2017, 11:41 AM

## 2017-10-12 NOTE — Progress Notes (Signed)
PROGRESS NOTE    Jeffrey Nelson  ONG:[id number] DOB: 23-Aug-1935 DOA: 10/09/2017 PCP: Oda Kilts, MD  Outpatient Specialists:   Brief Narrative: The patient is an 83 year old Caucasian male with past medical history significant for diabetes mellitus, coronary artery disease and hypertension.  Patient was admitted with right hip pain and radiculopathic symptoms.  Bradycardia was initially noted.  Hospitalist service was initially consulted to assist with the bradycardia.  The patient was on a beta-blocker prior to admission, and this has been adjusted accordingly.  Heart rate is better controlled.  Right hip pain and radiculopathy are being worked up, and the orthopedic team is directing the workup.  MRI of the right hip done earlier today revealed osteoarthritis of the right hip.  Will await further workup.    10/11/2017-patient continues to report right leg pain after activity.  Myelogram was consulted by radiology earlier today as the patient was on Plavix.  Discussed extensively with the patient and the patient's wife.  Perhaps, we will try to pursue MRI of the lumbar spine with and without contrast as originally planned by orthopedic team if possible.  Orthopedic input is highly appreciated as well.  Will start patient on long acting pain medication.  10/12/2017-patient seen alongside patient's wife.  For MRI of the lumbar spine under general anesthesia.  MRI reviewed reveals small extruded L3-4 disc fragment on the right with upgoing disc material and impingement of the right L3 nerve root.  Orthopedic team plans to proceed with surgery in the morning.  Assessment & Plan:   Principal Problem:   Radiculopathy, lumbar region Active Problems:   Pain in right hip   Right knee sprain   Hip pain, acute, right   Bradycardia   Lumbar radiculopathy pain: -Myelogram was consulted today by the radiology team as the patient was on Plavix. .-We will pursue MRI of the lumbar spine with and  without contrast as originally planned by orthopedic team if possible. - Adequate analgesia.  Will start patient on oxycodone ER 10 mg p.o. twice daily, and adjust dose as tolerated. -Continue other pain medications. - Orthopedic input is highly appreciated. -Please see above. -For possible surgery in the morning.  Right hip pain: MRI of the hip revealed osteoarthritis.  No fractures. Continue adequate analgesia.  Coronary artery disease: This is stable. No chest pain.  Diabetes mellitus: Blood sugar ranges from 93-138. Continue current management.  Hypertension: Continue to optimize. Optimization of the patient's pain will also help improve blood pressure control.  Bradycardia Result significant Continue current management.  I have personally reviewed patient's lab and imaging studies.  Patient remains too unstable to be discharged back home.  DVT prophylaxis: Subcu heparin. Code Status: Full Family Communication: Wife and sister-in-law Disposition Plan: This will depend on hospital course   Consultants:   Orthopedics.  Procedures:   None  Antimicrobials:   None   Subjective: Patient patient continues to report back pain.     No fever or chills. No chest pain or shortness of breath  Objective: Vitals:   10/11/17 1443 10/11/17 2003 10/12/17 0501 10/12/17 1601  BP: 134/64 139/67 (!) 154/69 126/61  Pulse: 65 67 62 65  Resp: 16 16 16    Temp: 98.3 F (36.8 C) 98.4 F (36.9 C) 97.7 F (36.5 C) (!) 97.2 F (36.2 C)  TempSrc: Oral Oral Oral Oral  SpO2: 96% 96% 95% (!) 88%    Intake/Output Summary (Last 24 hours) at 10/12/2017 1846 Last data filed at 10/12/2017 1519 Gross per  24 hour  Intake 4346.17 ml  Output 800 ml  Net 3546.17 ml   There were no vitals filed for this visit.  Examination:  General exam: Appears calm and comfortable  Respiratory system: Clear to auscultation. Respiratory effort normal. Cardiovascular system: S1 & S2 heard,  No  pedal edema. Gastrointestinal system: Abdomen is nondistended, soft and nontender. No organomegaly or masses felt. Normal bowel sounds heard. Central nervous system: Alert and oriented. No focal neurological deficits. Extremities: No leg edema.   Data Reviewed: I have personally reviewed following labs and imaging studies  CBC: Recent Labs  Lab 10/09/17 1226 10/10/17 0641  WBC 13.3* 13.0*  NEUTROABS 10.1*  --   HGB 12.5* 12.9*  HCT 37.8* 38.8*  MCV 98.7 99.7  PLT 203 208   Basic Metabolic Panel: Recent Labs  Lab 10/09/17 1226 10/10/17 0641  NA 141  --   K 4.5  --   CL 104  --   CO2 27  --   GLUCOSE 124*  --   BUN 21*  --   CREATININE 1.03 1.00  CALCIUM 9.7  --    GFR: Estimated Creatinine Clearance: 75.3 mL/min (by C-G formula based on SCr of 1 mg/dL). Liver Function Tests: Recent Labs  Lab 10/09/17 1226  AST 15  ALT 15*  ALKPHOS 70  BILITOT 1.9*  PROT 7.0  ALBUMIN 4.0   No results for input(s): LIPASE, AMYLASE in the last 168 hours. No results for input(s): AMMONIA in the last 168 hours. Coagulation Profile: Recent Labs  Lab 10/09/17 1226  INR 1.01   Cardiac Enzymes: No results for input(s): CKTOTAL, CKMB, CKMBINDEX, TROPONINI in the last 168 hours. BNP (last 3 results) No results for input(s): PROBNP in the last 8760 hours. HbA1C: No results for input(s): HGBA1C in the last 72 hours. CBG: Recent Labs  Lab 10/11/17 2003 10/11/17 2304 10/12/17 0625 10/12/17 1158 10/12/17 1736  GLUCAP 98 97 118* 102* 190*   Lipid Profile: No results for input(s): CHOL, HDL, LDLCALC, TRIG, CHOLHDL, LDLDIRECT in the last 72 hours. Thyroid Function Tests: Recent Labs    10/10/17 0641  TSH 1.773   Anemia Panel: No results for input(s): VITAMINB12, FOLATE, FERRITIN, TIBC, IRON, RETICCTPCT in the last 72 hours. Urine analysis:    Component Value Date/Time   COLORURINE STRAW (A) 10/09/2017 1218   APPEARANCEUR CLEAR 10/09/2017 1218   LABSPEC 1.006  10/09/2017 1218   PHURINE 5.0 10/09/2017 1218   GLUCOSEU NEGATIVE 10/09/2017 1218   HGBUR NEGATIVE 10/09/2017 1218   BILIRUBINUR NEGATIVE 10/09/2017 1218   KETONESUR NEGATIVE 10/09/2017 1218   PROTEINUR NEGATIVE 10/09/2017 1218   UROBILINOGEN 0.2 05/07/2015 1345   NITRITE NEGATIVE 10/09/2017 1218   LEUKOCYTESUR NEGATIVE 10/09/2017 1218   Sepsis Labs: @LABRCNTIP (procalcitonin:4,lacticidven:4)  )No results found for this or any previous visit (from the past 240 hour(s)).       Radiology Studies: Mr Lumbar Spine W Wo Contrast  Result Date: 10/12/2017 CLINICAL DATA:  Back pain right leg pain with weakness. Recent falls. EXAM: MRI LUMBAR SPINE WITHOUT AND WITH CONTRAST TECHNIQUE: Multiplanar and multiecho pulse sequences of the lumbar spine were obtained without and with intravenous contrast. CONTRAST:  20mL MULTIHANCE GADOBENATE DIMEGLUMINE 529 MG/ML IV SOLN COMPARISON:  Lumbar MRI 11/19/2013 FINDINGS: Segmentation:  Normal Alignment: Mild retrolisthesis L2-3 and L3-4 which has developed since the prior MRI. Vertebrae: Negative for fracture or mass. Scattered small hemangiomata. Conus medullaris and cauda equina: Conus extends to the L1 level. Conus and cauda equina appear normal.  Paraspinal and other soft tissues: Small gallstone. No retroperitoneal mass or adenopathy Disc levels: T12-L1: Mild disc degeneration and disc bulging L1-2: Mild disc degeneration and mild disc bulging L2-3: Mild disc degeneration and disc bulging. Mild facet degeneration. Mild subarticular stenosis bilaterally. L3-4: Laminectomy on the right. Small extruded disc fragment on the right with upgoing disc material. Possible impingement right L3 nerve root. Mild facet degeneration. Moderate foraminal encroachment bilaterally. L4-5: Moderate disc degeneration left greater than right. Left-sided spurring with severe left foraminal encroachment and compression of the left L4 nerve root. Mild progression of left foraminal  encroachment since the prior MRI. Posterior laminectomy on the left without significant spinal stenosis. Mild right foraminal narrowing L5-S1: Negative IMPRESSION: Mild subarticular stenosis bilaterally L2-3 Right laminectomy L3-4. Small extruded disc fragment on the right with upgoing disc material and impingement of the right L3 nerve root. Left laminectomy L4-5. Disc degeneration and spurring on the left with left foraminal encroachment and compression of the left L4 nerve root. Electronically Signed   By: Marlan Palau M.D.   On: 10/12/2017 15:02        Scheduled Meds: . amLODipine  10 mg Oral Daily  . atenolol  25 mg Oral Daily  . docusate sodium  100 mg Oral BID  . insulin aspart  0-5 Units Subcutaneous QHS  . insulin aspart  0-9 Units Subcutaneous TID WC  . iopamidol  20 mL Intrathecal Once  . isosorbide mononitrate  30 mg Oral Daily  . lidocaine  1 patch Transdermal Q24H  . lidocaine (PF)  5 mL Intradermal Once  . linagliptin  5 mg Oral Daily  . oxyCODONE  10 mg Oral Q12H  . sodium chloride flush  3 mL Intravenous Q12H  . vitamin B-12  500 mcg Oral Daily   Continuous Infusions: . sodium chloride 50 mL/hr at 10/09/17 1525  . sodium chloride    . lactated ringers 10 mL/hr at 10/12/17 1300  . methocarbamol (ROBAXIN)  IV       LOS: 3 days    Time spent: 25 minutes    Berton Mount, MD  [facility] Pager #: [phone] 7PM-7AM contact night coverage as above

## 2017-10-12 NOTE — Transfer of Care (Signed)
Immediate Anesthesia Transfer of Care Note  Patient: Jeffrey Nelson  Procedure(s) Performed: MRI WITH ANESTHESIA (N/A )  Patient Location: Nursing Unit  Anesthesia Type:General  Level of Consciousness: awake, alert  and oriented  Airway & Oxygen Therapy: Patient Spontanous Breathing and Patient connected to nasal cannula oxygen  Post-op Assessment: Report given to RN and Post -op Vital signs reviewed and stable  Post vital signs: Reviewed and stable  Last Vitals:  Vitals:   10/11/17 2003 10/12/17 0501  BP: 139/67 (!) 154/69  Pulse: 67 62  Resp: 16 16  Temp: 36.9 C 36.5 C  SpO2: 96% 95%    Last Pain:  Vitals:   10/12/17 1041  TempSrc:   PainSc: 3       Patients Stated Pain Goal: 3 (10/12/17 0749)  Complications: No apparent anesthesia complications

## 2017-10-12 NOTE — Anesthesia Procedure Notes (Signed)
Procedure Name: LMA Insertion Date/Time: 10/12/2017 1:46 PM Performed by: Jed LimerickHarder, Khori Rosevear S, CRNA Pre-anesthesia Checklist: Patient identified, Emergency Drugs available, Suction available and Patient being monitored Patient Re-evaluated:Patient Re-evaluated prior to induction Oxygen Delivery Method: Circle System Utilized Preoxygenation: Pre-oxygenation with 100% oxygen Induction Type: IV induction Ventilation: Mask ventilation without difficulty LMA: LMA inserted LMA Size: 5.0 Number of attempts: 1 Placement Confirmation: positive ETCO2 Tube secured with: Tape Dental Injury: Teeth and Oropharynx as per pre-operative assessment

## 2017-10-12 NOTE — Progress Notes (Addendum)
     Subjective:   Procedure(s) (LRB): MRI WITH ANESTHESIA (N/A)  Awake, alert, not sleeping due to pain in right buttock and right thigh. Unable to have MRI due to claustrophobia, unable to have Myelogram due to aspirin, history of plavix and on heparin while in hospital. Plan for MRI with and without contrast today under General Anesthesia. Will review as soon as available and decide if surgery this afternoon. Hold NPO until decision is made.   Patient reports pain as marked.    Objective:   VITALS:  Temp:  [97.7 F (36.5 C)-98.4 F (36.9 C)] 97.7 F (36.5 C) (02/15 0501) Pulse Rate:  [62-67] 62 (02/15 0501) Resp:  [16] 16 (02/15 0501) BP: (134-154)/(64-69) 154/69 (02/15 0501) SpO2:  [95 %-96 %] 95 % (02/15 0501)  ABD soft Intact pulses distally No cellulitis present Compartment soft Right quad wekness pain into right buttock and right medial knee L3 oe L4 radiculopathy.   LABS Recent Labs    10/09/17 1226 10/10/17 0641  HGB 12.5* 12.9*  WBC 13.3* 13.0*  PLT 203 208   Recent Labs    10/09/17 1226 10/10/17 0641  NA 141  --   K 4.5  --   CL 104  --   CO2 27  --   BUN 21*  --   CREATININE 1.03 1.00  GLUCOSE 124*  --    Recent Labs    10/09/17 1226  INR 1.01     Assessment/Plan:   Procedure(s) (LRB): MRI WITH ANESTHESIA (N/A)  Up with therapy  NPO today until a decision is made as to surgery or SNF.  MRI lumbar spine with and without contrast under general anesthesia  Vira BrownsJames Avion Kutzer 10/12/2017, 8:44 AMPatient ID: Jeffrey Nelson, male   DOB: 1934/10/10, 83 y.o.   MRN: [id number]

## 2017-10-12 NOTE — Progress Notes (Signed)
Late note: Radiology cancelled myelogram due to pt not being off plavix for 5 days.

## 2017-10-12 NOTE — Care Management Important Message (Signed)
Important Message  Patient Details  Name: Jeffrey Nelson MRN: [id number] Date of Birth: 26-Nov-1934   Medicare Important Message Given:  Yes    [name] 10/12/2017, 12:25 PM

## 2017-10-12 NOTE — Progress Notes (Signed)
[facility] notified that patient takes 10mg  of Ambien po HS home medication. Ilean SkillVeronica Charlena Haub LPN

## 2017-10-12 NOTE — Progress Notes (Signed)
Patient ID: Jeffrey Nelson, male   DOB: 1935/08/05, 83 y.o.   MRN: [id number]     Subjective: Day of Surgery Procedure(s) (LRB): MRI WITH ANESTHESIA (N/A) Confusion, immediately post anesthesia for MRI of the lumbar spine for sever claustrophobia and inability to have myelogram. He has his hearing aid in place but notes that he does not remember anything that has been done an is still feeling some  Amnesia due to the general anesthetic.  Patient reports pain as mild.    Objective:   VITALS:  Temp:  [97.7 F (36.5 C)-98.4 F (36.9 C)] 97.7 F (36.5 C) (02/15 0501) Pulse Rate:  [62-67] 62 (02/15 0501) Resp:  [16] 16 (02/15 0501) BP: (139-154)/(67-69) 154/69 (02/15 0501) SpO2:  [95 %-96 %] 95 % (02/15 0501)  ABD soft Intact pulses distally No cellulitis present Compartment soft Right quad 4/5    MRI reviewed with family shows a focal disc herniation right L3-4 with extruded fragment extending superiorly and impinging on the right L3 nerve root.   LABS Recent Labs    10/10/17 0641  HGB 12.9*  WBC 13.0*  PLT 208   Recent Labs    10/10/17 0641  CREATININE 1.00   No results for input(s): LABPT, INR in the last 72 hours.   Assessment/Plan:Lumbar radiculopathy due to right L3-4 disc herniation with extruded disc and right L3 nerve compression.  Fall risk. Post anesthesia altered mental status expected as he is immediately post anesthesia.   Day of Surgery Procedure(s) (LRB): MRI WITH ANESTHESIA (N/A)  Advance diet Up with therapy  This patient is lethargic and is post anesthetic with episodic Amnesia, expect this will improve but given his age 83 I do not Think informed consent is possible until he is more lucid. I will schedule him for surgery in the AM a right L3-4 microdiscectomy for HNP with extrusion, expect improved standing and walking tolerance and ability to then return to home Post recovery.  Will allow for diet and plan for OR in the AM, NPO past  midnight.   Vira BrownsJames Staci Carver 10/12/2017, 3:46 PM

## 2017-10-13 ENCOUNTER — Inpatient Hospital Stay (HOSPITAL_COMMUNITY): Payer: MEDICARE

## 2017-10-13 ENCOUNTER — Encounter (HOSPITAL_COMMUNITY): Payer: Self-pay | Admitting: Specialist

## 2017-10-13 ENCOUNTER — Encounter (HOSPITAL_COMMUNITY)
Admission: AD | Disposition: A | Discharge status: Home Health | Payer: Self-pay | Source: Ambulatory Visit | Attending: Internal Medicine

## 2017-10-13 ENCOUNTER — Inpatient Hospital Stay (HOSPITAL_COMMUNITY): Payer: MEDICARE | Admitting: Certified Registered"

## 2017-10-13 DIAGNOSIS — M5116 Intervertebral disc disorders with radiculopathy, lumbar region: Secondary | ICD-10-CM | POA: Diagnosis not present

## 2017-10-13 DIAGNOSIS — Z87442 Personal history of urinary calculi: Secondary | ICD-10-CM | POA: Diagnosis not present

## 2017-10-13 DIAGNOSIS — R296 Repeated falls: Secondary | ICD-10-CM | POA: Diagnosis not present

## 2017-10-13 DIAGNOSIS — M5416 Radiculopathy, lumbar region: Secondary | ICD-10-CM | POA: Diagnosis not present

## 2017-10-13 DIAGNOSIS — I2510 Atherosclerotic heart disease of native coronary artery without angina pectoris: Secondary | ICD-10-CM | POA: Diagnosis not present

## 2017-10-13 DIAGNOSIS — M549 Dorsalgia, unspecified: Secondary | ICD-10-CM | POA: Diagnosis not present

## 2017-10-13 DIAGNOSIS — R001 Bradycardia, unspecified: Secondary | ICD-10-CM | POA: Diagnosis not present

## 2017-10-13 DIAGNOSIS — M25551 Pain in right hip: Secondary | ICD-10-CM | POA: Diagnosis not present

## 2017-10-13 HISTORY — PX: LUMBAR LAMINECTOMY: SHX95

## 2017-10-13 LAB — GLUCOSE, CAPILLARY
GLUCOSE-CAPILLARY: 137 mg/dL — AB (ref 65–99)
GLUCOSE-CAPILLARY: 131 mg/dL — AB (ref 65–99)
GLUCOSE-CAPILLARY: 120 mg/dL — AB (ref 65–99)
Glucose-Capillary: 127 mg/dL — ABNORMAL HIGH (ref 65–99)
Glucose-Capillary: 125 mg/dL — ABNORMAL HIGH (ref 65–99)

## 2017-10-13 LAB — SURGICAL PCR SCREEN
MRSA, PCR: NEGATIVE
Staphylococcus aureus: NEGATIVE

## 2017-10-13 SURGERY — MICRODISCECTOMY LUMBAR LAMINECTOMY
Anesthesia: General | Site: Back | Laterality: Right

## 2017-10-13 SURGERY — MRI WITH ANESTHESIA
Anesthesia: General

## 2017-10-13 MED ORDER — CEFAZOLIN SODIUM-DEXTROSE 2-4 GM/100ML-% IV SOLN
2.0000 g | Freq: Three times a day (TID) | INTRAVENOUS | Status: AC
  Administered 2017-10-14: 2 g via INTRAVENOUS
  Filled 2017-10-13 (×2): qty 100

## 2017-10-13 MED ORDER — LACTATED RINGERS IV SOLN
INTRAVENOUS | Status: DC | PRN
  Administered 2017-10-13: 10:00:00 via INTRAVENOUS

## 2017-10-13 MED ORDER — PHENYLEPHRINE HCL 10 MG/ML IJ SOLN
INTRAVENOUS | Status: DC | PRN
  Administered 2017-10-13: 25 ug/min via INTRAVENOUS

## 2017-10-13 MED ORDER — BUPIVACAINE LIPOSOME 1.3 % IJ SUSP
20.0000 mL | Freq: Once | INTRAMUSCULAR | Status: AC
Start: 2017-10-13 — End: 2017-10-13
  Administered 2017-10-13: 28 mL
  Filled 2017-10-13: qty 20

## 2017-10-13 MED ORDER — MENTHOL 3 MG MT LOZG: 1.0000 | LOZENGE | OROMUCOSAL | Status: DC | PRN

## 2017-10-13 MED ORDER — SUGAMMADEX SODIUM 200 MG/2ML IV SOLN
INTRAVENOUS | Status: DC | PRN
  Administered 2017-10-13: 206 mg via INTRAVENOUS

## 2017-10-13 MED ORDER — ALUM & MAG HYDROXIDE-SIMETH 200-200-20 MG/5ML PO SUSP: 30.0000 mL | Freq: Four times a day (QID) | ORAL | Status: DC | PRN

## 2017-10-13 MED ORDER — LIDOCAINE HCL (CARDIAC) 20 MG/ML IV SOLN
INTRAVENOUS | Status: DC | PRN
  Administered 2017-10-13: 40 mg via INTRAVENOUS

## 2017-10-13 MED ORDER — SODIUM CHLORIDE 0.9 % IV SOLN: 250.0000 mL | INTRAVENOUS | Status: DC

## 2017-10-13 MED ORDER — EPHEDRINE SULFATE 50 MG/ML IJ SOLN
INTRAMUSCULAR | Status: DC | PRN
  Administered 2017-10-13: 5 mg via INTRAVENOUS
  Administered 2017-10-13: 10 mg via INTRAVENOUS
  Administered 2017-10-13: 5 mg via INTRAVENOUS
  Administered 2017-10-13: 10 mg via INTRAVENOUS

## 2017-10-13 MED ORDER — SODIUM CHLORIDE 0.9 % IV SOLN: INTRAVENOUS | Status: DC

## 2017-10-13 MED ORDER — PHENOL 1.4 % MT LIQD: 1.0000 | OROMUCOSAL | Status: DC | PRN

## 2017-10-13 MED ORDER — ZOLPIDEM TARTRATE 5 MG PO TABS
10.0000 mg | ORAL_TABLET | Freq: Every evening | ORAL | Status: DC | PRN
  Administered 2017-10-13: 10 mg via ORAL
  Filled 2017-10-13: qty 2

## 2017-10-13 MED ORDER — PROPOFOL 10 MG/ML IV BOLUS
INTRAVENOUS | Status: DC | PRN
  Administered 2017-10-13: 80 mg via INTRAVENOUS

## 2017-10-13 MED ORDER — ONDANSETRON HCL 4 MG/2ML IJ SOLN
INTRAMUSCULAR | Status: DC | PRN
  Administered 2017-10-13: 4 mg via INTRAVENOUS

## 2017-10-13 MED ORDER — ACETAMINOPHEN 325 MG PO TABS: 650.0000 mg | ORAL_TABLET | ORAL | Status: DC | PRN

## 2017-10-13 MED ORDER — FENTANYL CITRATE (PF) 100 MCG/2ML IJ SOLN
25.0000 ug | INTRAMUSCULAR | Status: DC | PRN
  Administered 2017-10-13: 50 ug via INTRAVENOUS

## 2017-10-13 MED ORDER — FENTANYL CITRATE (PF) 100 MCG/2ML IJ SOLN
INTRAMUSCULAR | Status: DC | PRN
  Administered 2017-10-13: 50 ug via INTRAVENOUS
  Administered 2017-10-13: 75 ug via INTRAVENOUS
  Administered 2017-10-13 (×2): 25 ug via INTRAVENOUS

## 2017-10-13 MED ORDER — BUPIVACAINE HCL 0.5 % IJ SOLN
INTRAMUSCULAR | Status: DC | PRN
  Administered 2017-10-13: 28 mL

## 2017-10-13 MED ORDER — THROMBIN (RECOMBINANT) 20000 UNITS EX SOLR
CUTANEOUS | Status: DC | PRN
  Administered 2017-10-13: 20 mL via TOPICAL

## 2017-10-13 MED ORDER — FENTANYL CITRATE (PF) 100 MCG/2ML IJ SOLN
INTRAMUSCULAR | Status: AC
  Filled 2017-10-13: qty 2

## 2017-10-13 MED ORDER — ACETAMINOPHEN 650 MG RE SUPP: 650.0000 mg | RECTAL | Status: DC | PRN

## 2017-10-13 MED ORDER — SODIUM CHLORIDE 0.9% FLUSH
3.0000 mL | Freq: Two times a day (BID) | INTRAVENOUS | Status: DC
  Administered 2017-10-13 – 2017-10-14 (×2): 3 mL via INTRAVENOUS

## 2017-10-13 MED ORDER — THROMBIN 20000 UNITS EX SOLR
CUTANEOUS | Status: AC
  Filled 2017-10-13: qty 20000

## 2017-10-13 MED ORDER — FENTANYL CITRATE (PF) 250 MCG/5ML IJ SOLN
INTRAMUSCULAR | Status: AC
Start: 2017-10-13 — End: 2017-10-13
  Filled 2017-10-13: qty 5

## 2017-10-13 MED ORDER — LACTATED RINGERS IV SOLN: INTRAVENOUS | Status: DC

## 2017-10-13 MED ORDER — BUPIVACAINE HCL (PF) 0.5 % IJ SOLN
INTRAMUSCULAR | Status: AC
  Filled 2017-10-13: qty 30

## 2017-10-13 MED ORDER — ROCURONIUM BROMIDE 100 MG/10ML IV SOLN
INTRAVENOUS | Status: DC | PRN
  Administered 2017-10-13: 10 mg via INTRAVENOUS
  Administered 2017-10-13: 50 mg via INTRAVENOUS
  Administered 2017-10-13: 10 mg via INTRAVENOUS
  Administered 2017-10-13 (×2): 5 mg via INTRAVENOUS

## 2017-10-13 MED ORDER — NITROGLYCERIN IN D5W 200-5 MCG/ML-% IV SOLN
INTRAVENOUS | Status: AC
  Filled 2017-10-13: qty 250

## 2017-10-13 MED ORDER — GLYCOPYRROLATE 0.2 MG/ML IJ SOLN
INTRAMUSCULAR | Status: DC | PRN
  Administered 2017-10-13: .1 mg via INTRAVENOUS

## 2017-10-13 MED ORDER — 0.9 % SODIUM CHLORIDE (POUR BTL) OPTIME
TOPICAL | Status: DC | PRN
  Administered 2017-10-13: 1000 mL

## 2017-10-13 MED ORDER — SODIUM CHLORIDE 0.9% FLUSH
3.0000 mL | INTRAVENOUS | Status: DC | PRN
Start: 2017-10-13 — End: 2017-10-14

## 2017-10-13 SURGICAL SUPPLY — 56 items
ADH SKN CLS APL DERMABOND .7 (GAUZE/BANDAGES/DRESSINGS) ×1
APL SKNCLS STERI-STRIP NONHPOA (GAUZE/BANDAGES/DRESSINGS) ×1
BENZOIN TINCTURE PRP APPL 2/3 (GAUZE/BANDAGES/DRESSINGS) ×3 IMPLANT
BUR SABER RD CUTTING 3.0 (BURR) ×2 IMPLANT
BUR SABER RD CUTTING 3.0MM (BURR) ×1
CANISTER SUCT 3000ML PPV (MISCELLANEOUS) ×3 IMPLANT
CLOSURE WOUND 1/2 X4 (GAUZE/BANDAGES/DRESSINGS) ×1
COVER MAYO STAND STRL (DRAPES) ×3 IMPLANT
COVER SURGICAL LIGHT HANDLE (MISCELLANEOUS) ×3 IMPLANT
DERMABOND ADVANCED (GAUZE/BANDAGES/DRESSINGS) ×2
DERMABOND ADVANCED .7 DNX12 (GAUZE/BANDAGES/DRESSINGS) ×1 IMPLANT
DRAPE C-ARM 42X72 X-RAY (DRAPES) IMPLANT
DRAPE HALF SHEET 40X57 (DRAPES) IMPLANT
DRAPE MICROSCOPE LEICA (MISCELLANEOUS) ×3 IMPLANT
DRAPE SURG 17X23 STRL (DRAPES) ×12 IMPLANT
DRSG MEPILEX BORDER 4X4 (GAUZE/BANDAGES/DRESSINGS) ×3 IMPLANT
DRSG MEPILEX BORDER 4X8 (GAUZE/BANDAGES/DRESSINGS) IMPLANT
DURAPREP 26ML APPLICATOR (WOUND CARE) ×3 IMPLANT
ELECT BLADE 4.0 EZ CLEAN MEGAD (MISCELLANEOUS) ×3
ELECT CAUTERY BLADE 6.4 (BLADE) ×3 IMPLANT
ELECT REM PT RETURN 9FT ADLT (ELECTROSURGICAL) ×3
ELECTRODE BLDE 4.0 EZ CLN MEGD (MISCELLANEOUS) ×1 IMPLANT
ELECTRODE REM PT RTRN 9FT ADLT (ELECTROSURGICAL) ×1 IMPLANT
GLOVE BIOGEL PI IND STRL 8 (GLOVE) ×1 IMPLANT
GLOVE BIOGEL PI INDICATOR 8 (GLOVE) ×2
GLOVE ECLIPSE 8.5 STRL (GLOVE) ×3 IMPLANT
GLOVE ORTHO TXT STRL SZ7.5 (GLOVE) ×3 IMPLANT
GLOVE SURG 8.5 LATEX PF (GLOVE) ×3 IMPLANT
GOWN STRL REUS W/ TWL LRG LVL3 (GOWN DISPOSABLE) ×1 IMPLANT
GOWN STRL REUS W/TWL 2XL LVL3 (GOWN DISPOSABLE) ×6 IMPLANT
GOWN STRL REUS W/TWL LRG LVL3 (GOWN DISPOSABLE) ×3
KIT BASIN OR (CUSTOM PROCEDURE TRAY) ×3 IMPLANT
KIT ROOM TURNOVER OR (KITS) ×3 IMPLANT
NEEDLE 22X1 1/2 (OR ONLY) (NEEDLE) ×6 IMPLANT
NEEDLE SPNL 18GX3.5 QUINCKE PK (NEEDLE) ×6 IMPLANT
NS IRRIG 1000ML POUR BTL (IV SOLUTION) ×3 IMPLANT
PACK LAMINECTOMY ORTHO (CUSTOM PROCEDURE TRAY) ×3 IMPLANT
PAD ARMBOARD 7.5X6 YLW CONV (MISCELLANEOUS) ×6 IMPLANT
PATTIES SURGICAL .5 X.5 (GAUZE/BANDAGES/DRESSINGS) IMPLANT
PATTIES SURGICAL .5 X1 (DISPOSABLE) ×3 IMPLANT
PATTIES SURGICAL .75X.75 (GAUZE/BANDAGES/DRESSINGS) IMPLANT
SPONGE LAP 4X18 X RAY DECT (DISPOSABLE) IMPLANT
SPONGE SURGIFOAM ABS GEL 100 (HEMOSTASIS) ×6 IMPLANT
STRIP CLOSURE SKIN 1/2X4 (GAUZE/BANDAGES/DRESSINGS) ×2 IMPLANT
SUT VIC AB 1 CTX 36 (SUTURE) ×3
SUT VIC AB 1 CTX36XBRD ANBCTR (SUTURE) ×1 IMPLANT
SUT VIC AB 2-0 CT1 27 (SUTURE) ×3
SUT VIC AB 2-0 CT1 TAPERPNT 27 (SUTURE) ×1 IMPLANT
SUT VIC AB 3-0 X1 27 (SUTURE) ×3 IMPLANT
SUT VICRYL 0 UR6 27IN ABS (SUTURE) ×3 IMPLANT
SYR 20CC LL (SYRINGE) ×3 IMPLANT
SYR CONTROL 10ML LL (SYRINGE) ×3 IMPLANT
SYRINGE 20CC LL (MISCELLANEOUS) ×2 IMPLANT
TOWEL OR 17X24 6PK STRL BLUE (TOWEL DISPOSABLE) ×3 IMPLANT
TOWEL OR 17X26 10 PK STRL BLUE (TOWEL DISPOSABLE) ×3 IMPLANT
WATER STERILE IRR 1000ML POUR (IV SOLUTION) ×3 IMPLANT

## 2017-10-13 NOTE — Op Note (Addendum)
10/13/2017  12:24 PM  PATIENT:  Jeffrey Nelson  83 y.o. 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 Dr. Glennon Mac.  EBL: 37GB    COMPLICATIONS:  None.     PROCEDURE:The patient was met in the holding area, and the appropriate right Lumbar level L3-4 identified and marked with "x" and my initials.The patient was then transported to OR and was placed under general anesthesia without difficulty. The patient received appropriate preoperative antibiotic prophylaxis. The patient after intubation atraumatically was transferred to the operating room table, prone position, Merlin frame, sliding OR table. All pressure points were well padded. The arms in 90-90 well-padded at the elbows. Standard prep with Iodophor solution lower dorsal spine to the mid sacral segment. Draped in the usual manner Iodine Vi-Drape was used. Time-out procedure was called and correct. an18-gauge spinal needle was then inserted at the expected L3-4 level. Cross table lateral radiograph used to identify the spinal needle position. The needle was at the upper aspect of the lamina of L3. Skin inferior to this was then infiltrated with Marcaine half percent with exparel 1.3% total of 20 cc used. An incision approximately an 2 inches and a half in length was then made through skin and subcutaneous layers in line with the rightt side of the expected midline just superior to the spinal needle entry point. An incision made into the right lumbosacral fascia approximately 2 inches in length .  Smallest  Cobb was then introduced into the incision site and used to carefully form subperiosteal dissection of the paralumbar muscles off of the posterior lamina of the expected L3-4 level.The depth measured off of the dilators at about 60 mm and 60 mm retractors then placed on the scaffolding for the [facility] equipment and guided down to and docking on the posterior aspect of the lamina at the expected L3-4level. Cross table lateral identified the Kocher clamp at the L4 spinous process below the appropriate level L3-4. The exposure continue cranially exposing the next L3-4 facet and L3 lamina. Another spot film lateral radiograph demonstrated a Penfield #4 at the L3-4 facet. Using loope magnification and headlight, the L3-4 interspace carefully debrided the small amount of muscle attachment here and high-speed bur used to drill the medial aspect of the inferior articular process of L3 approximately 20%. The operating room microscope sterilely draped brought into the field. Under the operating room microscope a 3 mm Kerrison then used to enter the spinal canal over the superior aspect of the L4 lamina carefully using the Kerrison to debris the attachment as a curet. Foraminotomy was then performed over the right L4 nerve root. The medial 10% superior articular process of L4 and then resected using 3 mm Kerrison. This allowed for identification of the thecal sac. Penfield 4 was then used to carefully mobilize the thecal sac medially and the L4 nerve root identified within the lateral recess flattened over the posterior aspect of the protruded disc. Carefully the lateral aspect of the L4 nerve root was identified and a Gaspar Garbe 4 was used to mobilize the  nerve medially such that the protruded disc was visible with microscope. Using a Penfield 4 for retraction and a 15 blade scalpel was used to incise the posterior longitudinal ligament within the lateral recess on the right side longitudinally. Disc material was  removed using micropituitary rongeurs and nerve hook nerve root and then more easily able to be mobilized medially and retracted using a Derricho retractor. Further foraminotomies was performed over the L4 nerve root the nerve root was noted to be decompressed. without further compression. The nerve root able to be retracted along the medial aspect of the L4 pedicle and no disc material found to be subligamentous at this level. Ligamentum flavum was further debrided superiorly to above the level L3-4 disc. Had a moderate amount of further resection of the L3 lamina inferiorly and medially was performed. With this then the disc space at L3-4 was easily visualized and entry into the disc at the sided disc herniation was possible.Micropituitary was used to further debride this material superficially from the posterior aspect of the intervertebral disc is posterior lateral aspect of the disc. Moderate amount of further disc material was found extruded superficial to the posterior longitudinal ligament extending superiorly and laterally from the disc this was removed using micropituitary rongeurs and a Woodsen neuroprobe into the left L3 neuroforamen additionally epstein  currettes were used to decompress the left L3-4 disc and L3 neuroforamen. Epstein currettes were used to further remove portions of the disc that was protruding posterior and lateral at the L3-4 disc space.  Ligamentum flavum was debrided and lateral recess along the medial aspect L3-4 facet no further decompression was necessary. Ball tip nerve probe was then able to carefully palpate the neuroforamen for L4 and L3 finding these to be well decompressed. Bleeding was then controlled using thrombin-soaked Gelfoam small cottonoids. Small amount of bleeding within the soft tissue mass the laminotomy area was controlled using bipolar electrocautery. Irrigation was carried out using copious amounts of irrigant solution. All Gelfoam were then removed. No  significant active bleeding present at the time of removal. All instruments sponge counts were correct traction system was then carefully removed carefully rotating retractors with this withdrawal and only bipolar electrocautery of any small bleeders. Lumbodorsal fascia was then carefully approximated with interrupted 0 Vicryl sutures, UR 6 needle deep subcutaneous layers were approximated with interrupted 0 Vicryl sutures on UR 6 the appear subcutaneous layers approximated with interrupted 2-0 Vicryl sutures and the skin closed with a running subcutaneous stitch of 4-0 Vicryl. Dermabond was applied allowed to dry and then Mepilex bandage applied. Patient was then carefully returned to supine position on a stretcher, reactivated and extubated. He was then returned to recovery room in satisfactory condition. OR nursing personnel perform the duties of Pensions consultant during this case. they were present from the beginning of the case to the end of the case assisting in transfer the patient from his stretcher to the OR table and back to the stretcher at the end of the case. Assisted in careful retraction and suction of the laminectomy site delicate neural structures operating under the operating room microscope. he performed closure of the incision from the fascia to the skin applying the dressing.     Basil Dess  10/13/2017, 12:24 PM

## 2017-10-13 NOTE — H&P (View-Only) (Signed)
     Subjective: Day of Surgery Procedure(s) (LRB): MICRODISCECTOMY LUMBAR LAMINECTOMY RIGHT L3-4 (Right) Preop awake, alert and oriented x 4, hard of hearing without his hearing aid. Informed of risk and benefits of surgery and wishes to proceed.   Patient reports pain as marked.    Objective:   VITALS:  Temp:  [97.2 F (36.2 C)-98.7 F (37.1 C)] 97.5 F (36.4 C) (02/16 0530) Pulse Rate:  [61-69] 61 (02/16 0530) Resp:  [17] 17 (02/16 0530) BP: (116-130)/(57-62) 130/62 (02/16 0530) SpO2:  [88 %-94 %] 92 % (02/16 0530)  ABD soft Sensation intact distally Intact pulses distally Compartment soft Right 4/5 weakness quadriceps.    LABS No results for input(s): HGB, WBC, PLT in the last 72 hours. No results for input(s): NA, K, CL, CO2, BUN, CREATININE, GLUCOSE in the last 72 hours. No results for input(s): LABPT, INR in the last 72 hours.   Assessment/Plan: Day of Surgery Procedure(s) (LRB): MICRODISCECTOMY LUMBAR LAMINECTOMY RIGHT L3-4 (Right)  IV fluids NPO for microdiscectomy this AM   Jeffrey Nelson Jeffrey Nelson 10/13/2017, 9:39 AM Patient ID: Jeffrey Nelson, male   DOB: 1935/01/22, 83 y.o.   MRN: [id number]

## 2017-10-13 NOTE — Anesthesia Preprocedure Evaluation (Addendum)
Anesthesia Evaluation  Patient identified by MRN, date of birth, ID band Patient awake    Reviewed: Allergy & Precautions, NPO status , Patient's Chart, lab work & pertinent test results  History of Anesthesia Complications Negative for: history of anesthetic complications  Airway Mallampati: I  TM Distance: >3 FB Neck ROM: Full    Dental  (+) Caps, Dental Advisory Given   Pulmonary former smoker (quit 1958),    breath sounds clear to auscultation       Cardiovascular hypertension, Pt. on medications and Pt. on home beta blockers (-) angina+ CAD, + Past MI, + Cardiac Stents and + CABG   Rhythm:Regular Rate:Normal  '18 cath: grafts patent, stents patent '17 EF 49%   Neuro/Psych CVA (vision changes), Residual Symptoms    GI/Hepatic negative GI ROS, Neg liver ROS,   Endo/Other  diabetes (glu 137)  Renal/GU negative Renal ROS     Musculoskeletal  (+) Arthritis , Osteoarthritis,    Abdominal   Peds  Hematology plavix   Anesthesia Other Findings   Reproductive/Obstetrics                            Anesthesia Physical Anesthesia Plan  ASA: III  Anesthesia Plan: General   Post-op Pain Management:    Induction: Intravenous  PONV Risk Score and Plan: 3 and Ondansetron, Dexamethasone and Treatment may vary due to age or medical condition  Airway Management Planned: Oral ETT  Additional Equipment:   Intra-op Plan:   Post-operative Plan: Extubation in OR  Informed Consent: I have reviewed the patients History and Physical, chart, labs and discussed the procedure including the risks, benefits and alternatives for the proposed anesthesia with the patient or authorized representative who has indicated his/her understanding and acceptance.   Dental advisory given  Plan Discussed with: CRNA and Surgeon  Anesthesia Plan Comments: (Plan routine monitors, GETA)        Anesthesia Quick  Evaluation

## 2017-10-13 NOTE — Progress Notes (Signed)
PROGRESS NOTE    Jeffrey Nelson  ZOX:[id number] DOB: 1935/07/19 DOA: 10/09/2017 PCP: Oda Kilts, MD  Outpatient Specialists:   Brief Narrative: The patient is an 83 year old Caucasian male with past medical history significant for diabetes mellitus, coronary artery disease and hypertension.  Patient was admitted with right hip pain and radiculopathic symptoms.  Bradycardia was initially noted.  Hospitalist service was initially consulted to assist with the bradycardia.  The patient was on a beta-blocker prior to admission, and this has been adjusted accordingly.  Heart rate is better controlled.  Right hip pain and radiculopathy are being worked up, and the orthopedic team is directing the workup.  MRI of the right hip done earlier today revealed osteoarthritis of the right hip.  Will await further workup.    10/11/2017-patient continues to report right leg pain after activity.  Myelogram was consulted by radiology earlier today as the patient was on Plavix.  Discussed extensively with the patient and the patient's wife.  Perhaps, we will try to pursue MRI of the lumbar spine with and without contrast as originally planned by orthopedic team if possible.  Orthopedic input is highly appreciated as well.  Will start patient on long acting pain medication.  10/12/2017-patient seen alongside patient's wife.  For MRI of the lumbar spine under general anesthesia.  MRI reviewed reveals small extruded L3-4 disc fragment on the right with upgoing disc material and impingement of the right L3 nerve root.  Orthopedic team plans to proceed with surgery in the morning.  10/13/2017: Patient underwent microdiscectomy lumbar laminectomy right L3-4 for right L3-4 herniated disc.  Patient seen alongside patient's wife.  Patient is comfortable.  In no new complaints.  Assessment & Plan:   Principal Problem:   Lumbar disc herniation with radiculopathy Active Problems:   Radiculopathy, lumbar region   Pain in  right hip   Right knee sprain   Hip pain, acute, right   Bradycardia   Lumbar radiculopathy pain: -Myelogram was consulted today by the radiology team as the patient was on Plavix. .-We will pursue MRI of the lumbar spine with and without contrast as originally planned by orthopedic team if possible. - Adequate analgesia.  Will start patient on oxycodone ER 10 mg p.o. twice daily, and adjust dose as tolerated. -Continue other pain medications. - Orthopedic input is highly appreciated. -Please see above. -For possible surgery in the morning. -10/13/2017: Patient is status post microdiscectomy lumbar laminectomy right L3-4.  Right hip pain: MRI of the hip revealed osteoarthritis.  No fractures. Continue adequate analgesia. Stable.  Coronary artery disease: This is stable. No chest pain.  Diabetes mellitus: Blood sugar ranges from 93-138. Continue current management.  Hypertension: Continue to optimize. Optimization of the patient's pain will also help improve blood pressure control.  Bradycardia Result significant Continue current management.  I have personally reviewed patient's lab and imaging studies.  Patient remains too unstable to be discharged back home.  DVT prophylaxis: Subcu heparin. Code Status: Full Family Communication: Wife and sister-in-law Disposition Plan: This will depend on hospital course   Consultants:   Orthopedics.  Procedures:   None  Antimicrobials:   None   Subjective: Patient is comfortable post. No complaints.     No fever or chills. No chest pain or shortness of breath  Objective: Vitals:   10/13/17 1300 10/13/17 1315 10/13/17 1330 10/13/17 1400  BP: (!) 117/56 (!) 121/53 (!) 116/50 (!) 118/47  Pulse: 73 60 60 (!) 58  Resp: 13 16 16  15  Temp:   97.7 F (36.5 C)   TempSrc:      SpO2: 98% 99%  100%    Intake/Output Summary (Last 24 hours) at 10/13/2017 1734 Last data filed at 10/13/2017 1237 Gross per 24 hour    Intake 1896.83 ml  Output 825 ml  Net 1071.83 ml   There were no vitals filed for this visit.  Examination:  General exam: Appears calm and comfortable  Respiratory system: Clear to auscultation. Respiratory effort normal. Cardiovascular system: S1 & S2 heard,  No pedal edema. Gastrointestinal system: Abdomen is nondistended, soft and nontender. No organomegaly or masses felt. Normal bowel sounds heard. Central nervous system: Alert and oriented. No focal neurological deficits. Extremities: No leg edema.   Data Reviewed: I have personally reviewed following labs and imaging studies  CBC: Recent Labs  Lab 10/09/17 1226 10/10/17 0641  WBC 13.3* 13.0*  NEUTROABS 10.1*  --   HGB 12.5* 12.9*  HCT 37.8* 38.8*  MCV 98.7 99.7  PLT 203 208   Basic Metabolic Panel: Recent Labs  Lab 10/09/17 1226 10/10/17 0641  NA 141  --   K 4.5  --   CL 104  --   CO2 27  --   GLUCOSE 124*  --   BUN 21*  --   CREATININE 1.03 1.00  CALCIUM 9.7  --    GFR: Estimated Creatinine Clearance: 75.3 mL/min (by C-G formula based on SCr of 1 mg/dL). Liver Function Tests: Recent Labs  Lab 10/09/17 1226  AST 15  ALT 15*  ALKPHOS 70  BILITOT 1.9*  PROT 7.0  ALBUMIN 4.0   No results for input(s): LIPASE, AMYLASE in the last 168 hours. No results for input(s): AMMONIA in the last 168 hours. Coagulation Profile: Recent Labs  Lab 10/09/17 1226  INR 1.01   Cardiac Enzymes: No results for input(s): CKTOTAL, CKMB, CKMBINDEX, TROPONINI in the last 168 hours. BNP (last 3 results) No results for input(s): PROBNP in the last 8760 hours. HbA1C: No results for input(s): HGBA1C in the last 72 hours. CBG: Recent Labs  Lab 10/12/17 2131 10/13/17 0700 10/13/17 0857 10/13/17 1238 10/13/17 1601  GLUCAP 158* 137* 127* 131* 125*   Lipid Profile: No results for input(s): CHOL, HDL, LDLCALC, TRIG, CHOLHDL, LDLDIRECT in the last 72 hours. Thyroid Function Tests: No results for input(s): TSH,  T4TOTAL, FREET4, T3FREE, THYROIDAB in the last 72 hours. Anemia Panel: No results for input(s): VITAMINB12, FOLATE, FERRITIN, TIBC, IRON, RETICCTPCT in the last 72 hours. Urine analysis:    Component Value Date/Time   COLORURINE STRAW (A) 10/09/2017 1218   APPEARANCEUR CLEAR 10/09/2017 1218   LABSPEC 1.006 10/09/2017 1218   PHURINE 5.0 10/09/2017 1218   GLUCOSEU NEGATIVE 10/09/2017 1218   HGBUR NEGATIVE 10/09/2017 1218   BILIRUBINUR NEGATIVE 10/09/2017 1218   KETONESUR NEGATIVE 10/09/2017 1218   PROTEINUR NEGATIVE 10/09/2017 1218   UROBILINOGEN 0.2 05/07/2015 1345   NITRITE NEGATIVE 10/09/2017 1218   LEUKOCYTESUR NEGATIVE 10/09/2017 1218   Sepsis Labs: @LABRCNTIP (procalcitonin:4,lacticidven:4)  ) Recent Results (from the past 240 hour(s))  Surgical pcr screen     Status: None   Collection Time: 10/12/17  8:34 PM  Result Value Ref Range Status   MRSA, PCR NEGATIVE NEGATIVE Final   Staphylococcus aureus NEGATIVE NEGATIVE Final    Comment: (NOTE) The Xpert SA Assay (FDA approved for NASAL specimens in patients 81 years of age and older), is one component of a comprehensive surveillance program. It is not intended to diagnose infection nor to  guide or monitor treatment. Performed at [facility], [address] [place], [place] [address]          Radiology Studies: Dg Lumbar Spine 2-3 Views  Result Date: 10/13/2017 CLINICAL DATA:  Localization for L3-4 microdiscectomy. EXAM: LUMBAR SPINE - 2-3 VIEW COMPARISON:  10/05/2017 and MRI 10/12/2017 FINDINGS: Vertebral body alignment and heights are normal. There is mild spondylosis of the lumbar spine to include moderate facet arthropathy. There is moderate disc space narrowing at the L4-5 level. Initial image demonstrates a metallic surgical instrument with tip over the L3 spinous process. Subsequent images demonstrate metallic instruments with tip over the posterior elements at the L5 level and final image with metallic  instrument over the posterior elements at the L4 level. Recommend correlation with findings at the time of the procedure. IMPRESSION: Mild spondylosis of the lumbar spine with disc disease at the L4-5 level. Metallic surgical instruments as described with the final image demonstrating metallic instrument with tip over the posterior elements of the L4 level. Electronically Signed   By: Elberta Fortis M.D.   On: 10/13/2017 11:42   Mr Lumbar Spine W Wo Contrast  Result Date: 10/12/2017 CLINICAL DATA:  Back pain right leg pain with weakness. Recent falls. EXAM: MRI LUMBAR SPINE WITHOUT AND WITH CONTRAST TECHNIQUE: Multiplanar and multiecho pulse sequences of the lumbar spine were obtained without and with intravenous contrast. CONTRAST:  20mL MULTIHANCE GADOBENATE DIMEGLUMINE 529 MG/ML IV SOLN COMPARISON:  Lumbar MRI 11/19/2013 FINDINGS: Segmentation:  Normal Alignment: Mild retrolisthesis L2-3 and L3-4 which has developed since the prior MRI. Vertebrae: Negative for fracture or mass. Scattered small hemangiomata. Conus medullaris and cauda equina: Conus extends to the L1 level. Conus and cauda equina appear normal. Paraspinal and other soft tissues: Small gallstone. No retroperitoneal mass or adenopathy Disc levels: T12-L1: Mild disc degeneration and disc bulging L1-2: Mild disc degeneration and mild disc bulging L2-3: Mild disc degeneration and disc bulging. Mild facet degeneration. Mild subarticular stenosis bilaterally. L3-4: Laminectomy on the right. Small extruded disc fragment on the right with upgoing disc material. Possible impingement right L3 nerve root. Mild facet degeneration. Moderate foraminal encroachment bilaterally. L4-5: Moderate disc degeneration left greater than right. Left-sided spurring with severe left foraminal encroachment and compression of the left L4 nerve root. Mild progression of left foraminal encroachment since the prior MRI. Posterior laminectomy on the left without significant  spinal stenosis. Mild right foraminal narrowing L5-S1: Negative IMPRESSION: Mild subarticular stenosis bilaterally L2-3 Right laminectomy L3-4. Small extruded disc fragment on the right with upgoing disc material and impingement of the right L3 nerve root. Left laminectomy L4-5. Disc degeneration and spurring on the left with left foraminal encroachment and compression of the left L4 nerve root. Electronically Signed   By: Marlan Palau M.D.   On: 10/12/2017 15:02        Scheduled Meds: . amLODipine  10 mg Oral Daily  . atenolol  25 mg Oral Daily  . docusate sodium  100 mg Oral BID  . fentaNYL      . insulin aspart  0-5 Units Subcutaneous QHS  . insulin aspart  0-9 Units Subcutaneous TID WC  . isosorbide mononitrate  30 mg Oral Daily  . lidocaine  1 patch Transdermal Q24H  . lidocaine (PF)  5 mL Intradermal Once  . linagliptin  5 mg Oral Daily  . oxyCODONE  10 mg Oral Q12H  . sodium chloride flush  3 mL Intravenous Q12H  . sodium chloride flush  3 mL Intravenous Q12H  . vitamin B-12  500 mcg Oral Daily   Continuous Infusions: . sodium chloride    . sodium chloride    . sodium chloride    . sodium chloride    .  ceFAZolin (ANCEF) IV    . methocarbamol (ROBAXIN)  IV       LOS: 4 days    Time spent: 25 minutes    Berton Mount, MD  [facility] Pager #: [phone] 7PM-7AM contact night coverage as above

## 2017-10-13 NOTE — Anesthesia Procedure Notes (Signed)
Procedure Name: Intubation Date/Time: 10/13/2017 10:07 AM Performed by: Wilder GladeWinn, Allani Reber G, CRNA Pre-anesthesia Checklist: Patient identified, Emergency Drugs available, Suction available, Patient being monitored and Timeout performed Patient Re-evaluated:Patient Re-evaluated prior to induction Oxygen Delivery Method: Circle system utilized Preoxygenation: Pre-oxygenation with 100% oxygen Induction Type: IV induction Ventilation: Mask ventilation without difficulty Laryngoscope Size: Miller and 2 Grade View: Grade I Tube type: Oral Tube size: 8.0 mm Number of attempts: 1 Airway Equipment and Method: Stylet Placement Confirmation: ETT inserted through vocal cords under direct vision,  positive ETCO2 and breath sounds checked- equal and bilateral Secured at: 23 cm Tube secured with: Tape Dental Injury: Teeth and Oropharynx as per pre-operative assessment

## 2017-10-13 NOTE — Discharge Instructions (Addendum)
    No lifting greater than 10 lbs. Avoid bending, stooping and twisting. Walk in house for first week them may start to get out slowly increasing distance up to one mile by 3 weeks post op. Keep incision dry for 3 days, may use tegaderm or similar water impervious dressing.  

## 2017-10-13 NOTE — Progress Notes (Signed)
     Subjective: Day of Surgery Procedure(s) (LRB): MICRODISCECTOMY LUMBAR LAMINECTOMY RIGHT L3-4 (Right) Preop awake, alert and oriented x 4, hard of hearing without his hearing aid. Informed of risk and benefits of surgery and wishes to proceed.   Patient reports pain as marked.    Objective:   VITALS:  Temp:  [97.2 F (36.2 C)-98.7 F (37.1 C)] 97.5 F (36.4 C) (02/16 0530) Pulse Rate:  [61-69] 61 (02/16 0530) Resp:  [17] 17 (02/16 0530) BP: (116-130)/(57-62) 130/62 (02/16 0530) SpO2:  [88 %-94 %] 92 % (02/16 0530)  ABD soft Sensation intact distally Intact pulses distally Compartment soft Right 4/5 weakness quadriceps.    LABS No results for input(s): HGB, WBC, PLT in the last 72 hours. No results for input(s): NA, K, CL, CO2, BUN, CREATININE, GLUCOSE in the last 72 hours. No results for input(s): LABPT, INR in the last 72 hours.   Assessment/Plan: Day of Surgery Procedure(s) (LRB): MICRODISCECTOMY LUMBAR LAMINECTOMY RIGHT L3-4 (Right)  IV fluids NPO for microdiscectomy this AM   Jhana Giarratano 10/13/2017, 9:39 AM Patient ID: Jeffrey Nelson, male   DOB: 12/31/1934, 83 y.o.   MRN: [id number]  

## 2017-10-13 NOTE — Anesthesia Postprocedure Evaluation (Signed)
Anesthesia Post Note  Patient: Jeffrey Nelson  Procedure(s) Performed: MICRODISCECTOMY LUMBAR LAMINECTOMY RIGHT L3-4 (Right Back)     Patient location during evaluation: PACU Anesthesia Type: General Level of consciousness: awake and alert, patient cooperative and oriented Pain management: pain level controlled Vital Signs Assessment: post-procedure vital signs reviewed and stable Respiratory status: spontaneous breathing, nonlabored ventilation and respiratory function stable Cardiovascular status: blood pressure returned to baseline and stable Postop Assessment: no apparent nausea or vomiting Anesthetic complications: no    Last Vitals:  Vitals:   10/13/17 1300 10/13/17 1315  BP: (!) 117/56 (!) 121/53  Pulse: 73 60  Resp: 13 16  Temp:    SpO2: 98% 99%    Last Pain:  Vitals:   10/13/17 1315  TempSrc:   PainSc: 1                  Danitra Payano,E. Catherene Kaleta

## 2017-10-13 NOTE — Progress Notes (Signed)
OT Cancellation    10/13/17 1000  OT Visit Information  Last OT Received On 10/13/17  Reason Eval/Treat Not Completed Patient at procedure or test/ unavailable. Pt off the floor for surgery. Will return as schedule allows and pt medically ready. Thank you.   Stanislawa Gaffin MSOT, OTR/L Acute Rehab Pager: [phone] Office: [phone]

## 2017-10-13 NOTE — Interval H&P Note (Signed)
History and Physical Interval Note:  10/13/2017 9:41 AM  Jeffrey Nelson  has presented today for surgery, with the diagnosis of RIGHT L3-4 HERNIATED DISC  The various methods of treatment have been discussed with the patient and family. After consideration of risks, benefits and other options for treatment, the patient has consented to  Procedure(s): MICRODISCECTOMY LUMBAR LAMINECTOMY RIGHT L3-4 (Right) as a surgical intervention .  The patient's history has been reviewed, patient examined, no change in status, stable for surgery.  I have reviewed the patient's chart and labs.  Questions were answered to the patient's satisfaction.     [name]

## 2017-10-13 NOTE — Brief Op Note (Signed)
10/13/2017  12:20 PM  PATIENT:  Leafy HalfMelvin W Crandle  83 y.o. male  PRE-OPERATIVE DIAGNOSIS:  RIGHT L3-4 HERNIATED DISC  POST-OPERATIVE DIAGNOSIS:  RIGHT L3-4 HERNIATED DISC  PROCEDURE:  Procedure(s): MICRODISCECTOMY LUMBAR LAMINECTOMY RIGHT L3-4 (Right)  SURGEON:  Surgeon(s) and Role:    * Kerrin ChampagneNitka, Hollace Michelli E, MD - Primary  ANESTHESIA:   local and general, Dr. Jean RosenthalJackson  EBL:  50cc  BLOOD ADMINISTERED:none  DRAINS: none   LOCAL MEDICATIONS USED:  MARCAINE 0.5% 3:2 exparel 1.3%  Amount: 30 ml  SPECIMEN:  No Specimen  DISPOSITION OF SPECIMEN:  N/A  COUNTS:  YES  TOURNIQUET:  * No tourniquets in log *  DICTATION: .Dragon Dictation  PLAN OF CARE: Likely discharge 10/14/2017  PATIENT DISPOSITION:  PACU - hemodynamically stable.   Delay start of Pharmacological VTE agent (>24hrs) due to surgical blood loss or risk of bleeding: yes

## 2017-10-13 NOTE — Transfer of Care (Signed)
Immediate Anesthesia Transfer of Care Note  Patient: Jeffrey Nelson  Procedure(s) Performed: MICRODISCECTOMY LUMBAR LAMINECTOMY RIGHT L3-4 (Right Back)  Patient Location: PACU  Anesthesia Type:General  Level of Consciousness: awake, alert , oriented and patient cooperative  Airway & Oxygen Therapy: Patient Spontanous Breathing and Patient connected to face mask oxygen  Post-op Assessment: Report given to RN and Post -op Vital signs reviewed and stable  Post vital signs: Reviewed and stable  Last Vitals:  Vitals:   10/12/17 2131 10/13/17 0530  BP: (!) 116/57 130/62  Pulse: 69 61  Resp:  17  Temp: 37.1 C (!) 36.4 C  SpO2: 94% 92%    Last Pain:  Vitals:   10/13/17 0530  TempSrc: Oral  PainSc:       Patients Stated Pain Goal: 3 (10/12/17 0749)  Complications: No apparent anesthesia complications

## 2017-10-13 NOTE — Progress Notes (Addendum)
0910 Pt to preop via bed, [facility]. Pt's wife present and will take pt's hearing aid before pt goes to OR.  1400 Received pt from PACU, A&O x4, on O2 at 2L per nasal canula. Lumbar dressing dry and intact, Denies numbness and no weakness to BLE. Pt with mild soreness and does not want pain meds at this time. Family at the bedside.

## 2017-10-14 ENCOUNTER — Encounter (HOSPITAL_COMMUNITY): Payer: Self-pay | Admitting: Specialist

## 2017-10-14 DIAGNOSIS — M549 Dorsalgia, unspecified: Secondary | ICD-10-CM | POA: Diagnosis not present

## 2017-10-14 DIAGNOSIS — R001 Bradycardia, unspecified: Secondary | ICD-10-CM | POA: Diagnosis not present

## 2017-10-14 DIAGNOSIS — E119 Type 2 diabetes mellitus without complications: Secondary | ICD-10-CM

## 2017-10-14 DIAGNOSIS — M5416 Radiculopathy, lumbar region: Secondary | ICD-10-CM | POA: Diagnosis not present

## 2017-10-14 DIAGNOSIS — M5116 Intervertebral disc disorders with radiculopathy, lumbar region: Secondary | ICD-10-CM | POA: Diagnosis not present

## 2017-10-14 DIAGNOSIS — M25551 Pain in right hip: Secondary | ICD-10-CM | POA: Diagnosis not present

## 2017-10-14 LAB — GLUCOSE, CAPILLARY
Glucose-Capillary: 112 mg/dL — ABNORMAL HIGH (ref 65–99)
Glucose-Capillary: 121 mg/dL — ABNORMAL HIGH (ref 65–99)

## 2017-10-14 MED ORDER — ATENOLOL 25 MG PO TABS: 25.0000 mg | ORAL_TABLET | Freq: Every day | ORAL | 3 refills | Status: DC

## 2017-10-14 MED ORDER — AMLODIPINE BESYLATE 10 MG PO TABS: 10.0000 mg | ORAL_TABLET | Freq: Every day | ORAL | 3 refills | Status: DC

## 2017-10-14 NOTE — Progress Notes (Signed)
Occupational Therapy Treatment Patient Details Name: Jeffrey Nelson MRN: [id number] DOB: September 28, 1934 Today's Date: 10/14/2017    History of present illness 83 y.o. male with 5 day history of back pain that is worsening since washing and waxing his truck and SUV last Thursday 10/04/2017. Currently being worked up for lumbar stenosis vs acute HNP for leg pain. Patient has also fallen 4 times since symptoms began. Scheduled for further imaging tomorrow 2/14.  PMH includes:  DM2, CAD, HTN, MI, Stroke, R TKA, back surgery.    OT comments  Pt progressing towards established OT goals. Pt performing ADLs and functional mobility at [facility] guard level. Providing handout and education on back precautions and compensatory techniques for LB dressing, grooming, bed mobility, and toileting. Pt and wife verbalizing and demonstrating understanding. Answered all questions in preparation for dc later today. Continue to recommend dc with HHOT to optimize safety and independence.    Follow Up Recommendations  Supervision/Assistance - 24 hour;Home health OT    Equipment Recommendations  None recommended by OT    Recommendations for Other Services      Precautions / Restrictions Precautions Precautions: Fall Precaution Comments: hx of falls; educated on back precautions for safety and pain management Required Braces or Orthoses: Knee Immobilizer - Right(Patient refuses to wear due to claustrophobia) Restrictions Weight Bearing Restrictions: No       Mobility Bed Mobility               General bed mobility comments: In recliner upon arrival. Educated pt on log roll for pain management and safety  Transfers Overall transfer level: Needs assistance Equipment used: Rolling walker (2 wheeled) Transfers: Sit to/from Stand Sit to Stand: Min guard         General transfer comment: Min Guard for safety    Balance Overall balance assessment: Needs assistance Sitting-balance support: Feet  unsupported;No upper extremity supported Sitting balance-Leahy Scale: Good     Standing balance support: During functional activity;Bilateral upper extremity supported Standing balance-Leahy Scale: Fair                             ADL either performed or assessed with clinical judgement   ADL Overall ADL's : Needs assistance/impaired                 Upper Body Dressing : Set up;Sitting Upper Body Dressing Details (indicate cue type and reason): Pt dressing upon arrival Lower Body Dressing: Min guard;Sit to/from stand;Minimal assistance Lower Body Dressing Details (indicate cue type and reason): Pt dressed upon arrival. educated pt on compensatory techniques for LB dressing and use of AE. Pt owns reacher and sock aide. Pt able to bring ankles to knees           Tub/Shower Transfer Details (indicate cue type and reason): Discussed safe shower transfer Functional mobility during ADLs: Min guard;Rolling walker General ADL Comments: reviewed back precautions and information on adherance during ADLs to management pain and increase safety     Vision       Perception     Praxis      Cognition Arousal/Alertness: Awake/alert Behavior During Therapy: WFL for tasks assessed/performed Overall Cognitive Status: Within Functional Limits for tasks assessed                                          Exercises  Shoulder Instructions       General Comments Wife present throughout session    Pertinent Vitals/ Pain       Pain Assessment: Faces Faces Pain Scale: Hurts even more Pain Location: R hip and gluteal area.  Pain Descriptors / Indicators: Discomfort;Aching Pain Intervention(s): Monitored during session;Limited activity within patient's tolerance;Repositioned  Home Living                                          Prior Functioning/Environment              Frequency  Min 2X/week        Progress Toward  Goals  OT Goals(current goals can now be found in the care plan section)  Progress towards OT goals: Progressing toward goals  Acute Rehab OT Goals Patient Stated Goal: return home with HHPT OT Goal Formulation: With patient/family Time For Goal Achievement: 10/25/17 Potential to Achieve Goals: Good ADL Goals Pt Will Perform Lower Body Bathing: with modified independence;sit to/from stand Pt Will Perform Lower Body Dressing: with modified independence;sit to/from stand Pt Will Transfer to Toilet: with modified independence;ambulating Pt Will Perform Toileting - Clothing Manipulation and hygiene: with modified independence;sit to/from stand Pt Will Perform Tub/Shower Transfer: with supervision;ambulating;rolling walker  Plan Discharge plan remains appropriate    Co-evaluation                 AM-PAC PT "6 Clicks" Daily Activity     Outcome Measure   Help from another person eating meals?: None Help from another person taking care of personal grooming?: None Help from another person toileting, which includes using toliet, bedpan, or urinal?: A Little Help from another person bathing (including washing, rinsing, drying)?: A Little Help from another person to put on and taking off regular upper body clothing?: None Help from another person to put on and taking off regular lower body clothing?: A Little 6 Click Score: 21    End of Session Equipment Utilized During Treatment: Rolling walker  OT Visit Diagnosis: Other abnormalities of gait and mobility (R26.89);Pain;History of falling (Z91.81) Pain - Right/Left: Right Pain - part of body: Leg   Activity Tolerance Patient tolerated treatment well   Patient Left with family/visitor present(OOB with PT)   Nurse Communication Mobility status;Precautions        Time: [id number] OT Time Calculation (min): 13 min  Charges: OT General Charges $OT Visit: 1 Visit OT Treatments $Self Care/Home Management : 8-22 mins  Ivalene Platte MSOT, OTR/L Acute Rehab Pager: [phone] Office: [phone]   Theodoro GristCharis M Ceara Wrightson 10/14/2017, 10:31 AM

## 2017-10-14 NOTE — Progress Notes (Signed)
Dr Otelia SergeantNitka made aware of need for discharge reconciliation needed to be completed in order to discharge this patient.

## 2017-10-14 NOTE — Evaluation (Signed)
Physical Therapy Re-Evaluation Patient Details Name: Jeffrey Nelson MRN: [id number] DOB: 1935-03-03 Today's Date: 10/14/2017   History of Present Illness  83 y.o. male with 5 day history of back pain that is worsening since washing and waxing his truck and SUV last Thursday 10/04/2017. Currently being worked up for lumbar stenosis vs acute HNP for leg pain. Patient has also fallen 4 times since symptoms began. Scheduled for further imaging tomorrow 2/14. Now s/p microdiscectomy and laminectomy; PMH includes:  DM2, CAD, HTN, MI, Stroke, R TKA, back surgery.   Clinical Impression  Patient is s/p above surgery resulting in functional limitations due to the deficits listed below (see PT Problem List). Went over precautions, gait, and stair negotiation; Questions solicited and answered;  Patient will benefit from skilled PT to increase their independence and safety with mobility to allow discharge to the venue listed below.       Follow Up Recommendations Home health PT;Supervision for mobility/OOB    Equipment Recommendations  None recommended by PT    Recommendations for Other Services       Precautions / Restrictions Precautions Precautions: Fall Precaution Comments: hx of falls; educated on back precautions for safety and pain management Required Braces or Orthoses: Knee Immobilizer - Right(Patient refuses to wear due to claustrophobia) Restrictions Weight Bearing Restrictions: No      Mobility  Bed Mobility               General bed mobility comments: In recliner upon arrival. Educated pt on log roll for pain management and safety  Transfers Overall transfer level: Needs assistance Equipment used: Rolling walker (2 wheeled) Transfers: Sit to/from Stand Sit to Stand: Supervision         General transfer comment: Cues for hand placement and safety  Ambulation/Gait Ambulation/Gait assistance: Min guard;Supervision Ambulation Distance (Feet): 150 Feet Assistive  device: Rolling walker (2 wheeled) Gait Pattern/deviations: Step-through pattern Gait velocity: decreased   General Gait Details: Walked with RW, which was helpful given R quad weakness; adjusted RW for optimal fit  Stairs Stairs: Yes Stairs assistance: Min assist Stair Management: No rails;With walker;Step to pattern;Backwards Number of Stairs: 5 General stair comments: Cues for technqiue; pt was familiar with backwards technique from his wife's previous surgeries; My main cue was to take is slow and make sure he activated R quad for stance stability before advancing LLE  Wheelchair Mobility    Modified Rankin (Stroke Patients Only)       Balance Overall balance assessment: Needs assistance Sitting-balance support: Feet unsupported;No upper extremity supported Sitting balance-Leahy Scale: Good     Standing balance support: During functional activity;Bilateral upper extremity supported Standing balance-Leahy Scale: Fair                               Pertinent Vitals/Pain Pain Assessment: Faces Faces Pain Scale: Hurts a little bit Pain Location: Low back Pain Descriptors / Indicators: Discomfort;Aching Pain Intervention(s): Monitored during session    Home Living Family/patient expects to be discharged to:: Private residence Living Arrangements: Spouse/significant other Available Help at Discharge: Family;Available 24 hours/day Type of Home: House Home Access: Stairs to enter Entrance Stairs-Rails: None Entrance Stairs-Number of Steps: 5 Home Layout: One level Home Equipment: Walker - 2 wheels;Walker - 4 wheels;Cane - single point      Prior Function Level of Independence: Independent         Comments: Independent without AD, active with yard work and house  chores.      Hand Dominance        Extremity/Trunk Assessment   Upper Extremity Assessment Upper Extremity Assessment: Defer to OT evaluation    Lower Extremity Assessment Lower  Extremity Assessment: RLE deficits/detail RLE Deficits / Details: Continued grossly decr strength knee extension; pt subjectively reports is better       Communication   Communication: HOH  Cognition Arousal/Alertness: Awake/alert Behavior During Therapy: WFL for tasks assessed/performed Overall Cognitive Status: Within Functional Limits for tasks assessed                                        General Comments General comments (skin integrity, edema, etc.): Wife present throughout session    Exercises     Assessment/Plan    PT Assessment Patient needs continued PT services  PT Problem List Decreased strength;Decreased range of motion;Decreased activity tolerance;Decreased balance;Decreased mobility;Decreased safety awareness       PT Treatment Interventions DME instruction;Gait training;Stair training;Functional mobility training;Therapeutic activities;Therapeutic exercise;Balance training;Patient/family education    PT Goals (Current goals can be found in the Care Plan section)  Acute Rehab PT Goals Patient Stated Goal: return home with HHPT PT Goal Formulation: With patient Time For Goal Achievement: 10/17/17 Potential to Achieve Goals: Fair    Frequency Min 5X/week   Barriers to discharge        Co-evaluation               AM-PAC PT "6 Clicks" Daily Activity  Outcome Measure Difficulty turning over in bed (including adjusting bedclothes, sheets and blankets)?: A Little Difficulty moving from lying on back to sitting on the side of the bed? : A Little Difficulty sitting down on and standing up from a chair with arms (e.g., wheelchair, bedside commode, etc,.)?: A Little Help needed moving to and from a bed to chair (including a wheelchair)?: A Little Help needed walking in hospital room?: A Little Help needed climbing 3-5 steps with a railing? : A Little 6 Click Score: 18    End of Session Equipment Utilized During Treatment: Gait  belt Activity Tolerance: Patient tolerated treatment well;Patient limited by pain Patient left: in chair;with call bell/phone within reach;with family/visitor present Nurse Communication: Mobility status PT Visit Diagnosis: Unsteadiness on feet (R26.81);Other abnormalities of gait and mobility (R26.89);History of falling (Z91.81);Repeated falls (R29.6);Muscle weakness (generalized) (M62.81);Other symptoms and signs involving the nervous system (R29.898)    Time: 1610-9604 PT Time Calculation (min) (ACUTE ONLY): 14 min   Charges:   PT Evaluation $PT Re-evaluation: 1 Re-eval     PT G Codes:        Van Clines, PT  Acute Rehabilitation Services Pager [phone] Office [phone]   Levi Aland 10/14/2017, 1:44 PM

## 2017-10-14 NOTE — Progress Notes (Signed)
Patient is discharged to home with his wife.  Written and verbal discharge instructions provided.  Patient verbalizes understanding the instructions and follow up appointments.  Patient is discharged via wheel chair.

## 2017-10-14 NOTE — Progress Notes (Signed)
     Subjective: 1 Day Post-Op Procedure(s) (LRB): MICRODISCECTOMY LUMBAR LAMINECTOMY RIGHT L3-4 (Right)Awake,alert and oriented x 4. Dressed in street clothes and ready for discharge. Right leg pain is resolved.   Patient reports pain as moderate.    Objective:   VITALS:  Temp:  [97.7 F (36.5 C)-98.6 F (37 C)] 98.6 F (37 C) (02/17 0506) Pulse Rate:  [58-74] 59 (02/17 0915) Resp:  [12-16] 16 (02/17 0506) BP: (116-125)/(39-56) 121/39 (02/17 0915) SpO2:  [92 %-100 %] 92 % (02/17 0506)  Neurologically intact ABD soft Neurovascular intact Sensation intact distally Intact pulses distally Dorsiflexion/Plantar flexion intact Incision: no drainage   LABS No results for input(s): HGB, WBC, PLT in the last 72 hours. No results for input(s): NA, K, CL, CO2, BUN, CREATININE, GLUCOSE in the last 72 hours. No results for input(s): LABPT, INR in the last 72 hours.   Assessment/Plan: 1 Day Post-Op Procedure(s) (LRB): MICRODISCECTOMY LUMBAR LAMINECTOMY RIGHT L3-4 (Right)  Advance diet Up with therapy Discharge home with home health He has adequate narcotics and sleep aid, ambien at home. Vira BrownsJames Lilith Solana 10/14/2017, 10:03 AM Patient ID: Leafy HalfMelvin W Defibaugh, male   DOB: 05/02/1935, 83 y.o.   MRN: [id number]

## 2017-10-14 NOTE — Progress Notes (Addendum)
PROGRESS NOTE    Jeffrey Nelson  ZOX:[id number] DOB: [birth year] DOA: 10/09/2017 PCP: Oda Kilts, MD  Outpatient Specialists:   Brief Narrative: The patient is an 83 year old Caucasian male with past medical history significant for diabetes mellitus, coronary artery disease and hypertension.  Patient was admitted with right hip pain and radiculopathic symptoms.  Bradycardia was initially noted.  Hospitalist service was initially consulted to assist with the bradycardia.  The patient was on a beta-blocker prior to admission, and this has been adjusted accordingly.  Heart rate is better controlled.  Right hip pain and radiculopathy are being worked up, and the orthopedic team is directing the workup.  MRI of the right hip done earlier today revealed osteoarthritis of the right hip.  Will await further workup.    10/11/2017-patient continues to report right leg pain after activity.  Myelogram was consulted by radiology earlier today as the patient was on Plavix.  Discussed extensively with the patient and the patient's wife.  Perhaps, we will try to pursue MRI of the lumbar spine with and without contrast as originally planned by orthopedic team if possible.  Orthopedic input is highly appreciated as well.  Will start patient on long acting pain medication.  10/12/2017-patient seen alongside patient's wife.  For MRI of the lumbar spine under general anesthesia.  MRI reviewed reveals small extruded L3-4 disc fragment on the right with upgoing disc material and impingement of the right L3 nerve root.  Orthopedic team plans to proceed with surgery in the morning.  10/13/2017: Patient underwent microdiscectomy lumbar laminectomy right L3-4 for right L3-4 herniated disc.  Patient seen alongside patient's wife.  Patient is comfortable.  In no new complaints.  10/14/2017: Patient seen alongside patient's wife.  No new complaints.  The back pain is much improved.  Patient only reports pain around the surgery  site.  The orthopedic team has discharged the patient.  Assessment & Plan:   Principal Problem:   Lumbar disc herniation with radiculopathy Active Problems:   Radiculopathy, lumbar region   Pain in right hip   Right knee sprain   Hip pain, acute, right   Bradycardia   Diabetes mellitus type 2, uncomplicated (HCC)   Lumbar radiculopathy pain: -Myelogram was consulted today by the radiology team as the patient was on Plavix. .-We will pursue MRI of the lumbar spine with and without contrast as originally planned by orthopedic team if possible. - Adequate analgesia.  Will start patient on oxycodone ER 10 mg p.o. twice daily, and adjust dose as tolerated. -Continue other pain medications. - Orthopedic input is highly appreciated. -Please see above. -For possible surgery in the morning. -10/13/2017: Patient is status post microdiscectomy lumbar laminectomy right L3-4. -10/14/2017: Postop day 1.  The back pain is much improved.  The orthopedic team is to discharge patient home.  Right hip pain: MRI of the hip revealed osteoarthritis.  No fractures. Continue adequate analgesia. Stable.  Coronary artery disease: This is stable. No chest pain.  Diabetes mellitus: Blood sugar ranges from 93-138. Continue current management.  Hypertension: Continue to optimize. Optimization of the patient's pain will also help improve blood pressure control.  Bradycardia Result significant Continue current management.  I have personally reviewed patient's lab and imaging studies.  Patient remains too unstable to be discharged back home.  DVT prophylaxis: Subcu heparin. Code Status: Full Family Communication: Wife and sister-in-law Disposition Plan: This will depend on hospital course   Consultants:   Orthopedics.  Procedures:   None  Antimicrobials:  None   Subjective: Patient is comfortable post postop day 1. Back pain is much improved. No complaints.     No fever or  chills. No chest pain or shortness of breath  Objective: Vitals:   10/13/17 1400 10/13/17 2049 10/14/17 0506 10/14/17 0915  BP: (!) 118/47 (!) 125/53 (!) 121/39 (!) 121/39  Pulse: (!) 58 61 (!) 59 (!) 59  Resp: 15 16 16    Temp:  98.4 F (36.9 C) 98.6 F (37 C)   TempSrc:  Oral Oral   SpO2: 100% 97% 92%     Intake/Output Summary (Last 24 hours) at 10/14/2017 1223 Last data filed at 10/14/2017 1056 Gross per 24 hour  Intake 1410 ml  Output 2075 ml  Net -665 ml   There were no vitals filed for this visit.  Examination:  General exam: Appears calm and comfortable  Respiratory system: Clear to auscultation. Respiratory effort normal. Cardiovascular system: S1 & S2 heard,  No pedal edema. Gastrointestinal system: Abdomen is nondistended, soft and nontender. No organomegaly or masses felt. Normal bowel sounds heard. Central nervous system: Alert and oriented. No focal neurological deficits. Extremities: No leg edema.   Data Reviewed: I have personally reviewed following labs and imaging studies  CBC: Recent Labs  Lab 10/09/17 1226 10/10/17 0641  WBC 13.3* 13.0*  NEUTROABS 10.1*  --   HGB 12.5* 12.9*  HCT 37.8* 38.8*  MCV 98.7 99.7  PLT 203 208   Basic Metabolic Panel: Recent Labs  Lab 10/09/17 1226 10/10/17 0641  NA 141  --   K 4.5  --   CL 104  --   CO2 27  --   GLUCOSE 124*  --   BUN 21*  --   CREATININE 1.03 1.00  CALCIUM 9.7  --    GFR: Estimated Creatinine Clearance: 75.3 mL/min (by C-G formula based on SCr of 1 mg/dL). Liver Function Tests: Recent Labs  Lab 10/09/17 1226  AST 15  ALT 15*  ALKPHOS 70  BILITOT 1.9*  PROT 7.0  ALBUMIN 4.0   No results for input(s): LIPASE, AMYLASE in the last 168 hours. No results for input(s): AMMONIA in the last 168 hours. Coagulation Profile: Recent Labs  Lab 10/09/17 1226  INR 1.01   Cardiac Enzymes: No results for input(s): CKTOTAL, CKMB, CKMBINDEX, TROPONINI in the last 168 hours. BNP (last 3  results) No results for input(s): PROBNP in the last 8760 hours. HbA1C: No results for input(s): HGBA1C in the last 72 hours. CBG: Recent Labs  Lab 10/13/17 1238 10/13/17 1601 10/13/17 2047 10/14/17 0648 10/14/17 1151  GLUCAP 131* 125* 120* 112* 121*   Lipid Profile: No results for input(s): CHOL, HDL, LDLCALC, TRIG, CHOLHDL, LDLDIRECT in the last 72 hours. Thyroid Function Tests: No results for input(s): TSH, T4TOTAL, FREET4, T3FREE, THYROIDAB in the last 72 hours. Anemia Panel: No results for input(s): VITAMINB12, FOLATE, FERRITIN, TIBC, IRON, RETICCTPCT in the last 72 hours. Urine analysis:    Component Value Date/Time   COLORURINE STRAW (A) 10/09/2017 1218   APPEARANCEUR CLEAR 10/09/2017 1218   LABSPEC 1.006 10/09/2017 1218   PHURINE 5.0 10/09/2017 1218   GLUCOSEU NEGATIVE 10/09/2017 1218   HGBUR NEGATIVE 10/09/2017 1218   BILIRUBINUR NEGATIVE 10/09/2017 1218   KETONESUR NEGATIVE 10/09/2017 1218   PROTEINUR NEGATIVE 10/09/2017 1218   UROBILINOGEN 0.2 05/07/2015 1345   NITRITE NEGATIVE 10/09/2017 1218   LEUKOCYTESUR NEGATIVE 10/09/2017 1218   Sepsis Labs: @LABRCNTIP (procalcitonin:4,lacticidven:4)  ) Recent Results (from the past 240 hour(s))  Surgical pcr  screen     Status: None   Collection Time: 10/12/17  8:34 PM  Result Value Ref Range Status   MRSA, PCR NEGATIVE NEGATIVE Final   Staphylococcus aureus NEGATIVE NEGATIVE Final    Comment: (NOTE) The Xpert SA Assay (FDA approved for NASAL specimens in patients 46 years of age and older), is one component of a comprehensive surveillance program. It is not intended to diagnose infection nor to guide or monitor treatment. Performed at [facility], [address], [place], [place] [address]          Radiology Studies: Dg Lumbar Spine 2-3 Views  Result Date: 10/13/2017 CLINICAL DATA:  Localization for L3-4 microdiscectomy. EXAM: LUMBAR SPINE - 2-3 VIEW COMPARISON:  10/05/2017 and MRI 10/12/2017  FINDINGS: Vertebral body alignment and heights are normal. There is mild spondylosis of the lumbar spine to include moderate facet arthropathy. There is moderate disc space narrowing at the L4-5 level. Initial image demonstrates a metallic surgical instrument with tip over the L3 spinous process. Subsequent images demonstrate metallic instruments with tip over the posterior elements at the L5 level and final image with metallic instrument over the posterior elements at the L4 level. Recommend correlation with findings at the time of the procedure. IMPRESSION: Mild spondylosis of the lumbar spine with disc disease at the L4-5 level. Metallic surgical instruments as described with the final image demonstrating metallic instrument with tip over the posterior elements of the L4 level. Electronically Signed   By: Elberta Fortis M.D.   On: 10/13/2017 11:42   Mr Lumbar Spine W Wo Contrast  Result Date: 10/12/2017 CLINICAL DATA:  Back pain right leg pain with weakness. Recent falls. EXAM: MRI LUMBAR SPINE WITHOUT AND WITH CONTRAST TECHNIQUE: Multiplanar and multiecho pulse sequences of the lumbar spine were obtained without and with intravenous contrast. CONTRAST:  20mL MULTIHANCE GADOBENATE DIMEGLUMINE 529 MG/ML IV SOLN COMPARISON:  Lumbar MRI 11/19/2013 FINDINGS: Segmentation:  Normal Alignment: Mild retrolisthesis L2-3 and L3-4 which has developed since the prior MRI. Vertebrae: Negative for fracture or mass. Scattered small hemangiomata. Conus medullaris and cauda equina: Conus extends to the L1 level. Conus and cauda equina appear normal. Paraspinal and other soft tissues: Small gallstone. No retroperitoneal mass or adenopathy Disc levels: T12-L1: Mild disc degeneration and disc bulging L1-2: Mild disc degeneration and mild disc bulging L2-3: Mild disc degeneration and disc bulging. Mild facet degeneration. Mild subarticular stenosis bilaterally. L3-4: Laminectomy on the right. Small extruded disc fragment on the  right with upgoing disc material. Possible impingement right L3 nerve root. Mild facet degeneration. Moderate foraminal encroachment bilaterally. L4-5: Moderate disc degeneration left greater than right. Left-sided spurring with severe left foraminal encroachment and compression of the left L4 nerve root. Mild progression of left foraminal encroachment since the prior MRI. Posterior laminectomy on the left without significant spinal stenosis. Mild right foraminal narrowing L5-S1: Negative IMPRESSION: Mild subarticular stenosis bilaterally L2-3 Right laminectomy L3-4. Small extruded disc fragment on the right with upgoing disc material and impingement of the right L3 nerve root. Left laminectomy L4-5. Disc degeneration and spurring on the left with left foraminal encroachment and compression of the left L4 nerve root. Electronically Signed   By: Marlan Palau M.D.   On: 10/12/2017 15:02        Scheduled Meds: . amLODipine  10 mg Oral Daily  . atenolol  25 mg Oral Daily  . docusate sodium  100 mg Oral BID  . insulin aspart  0-5 Units Subcutaneous QHS  .  insulin aspart  0-9 Units Subcutaneous TID WC  . isosorbide mononitrate  30 mg Oral Daily  . lidocaine  1 patch Transdermal Q24H  . lidocaine (PF)  5 mL Intradermal Once  . linagliptin  5 mg Oral Daily  . oxyCODONE  10 mg Oral Q12H  . sodium chloride flush  3 mL Intravenous Q12H  . sodium chloride flush  3 mL Intravenous Q12H  . vitamin B-12  500 mcg Oral Daily   Continuous Infusions: . sodium chloride    . sodium chloride    . methocarbamol (ROBAXIN)  IV       LOS: 5 days    Time spent: 25 minutes    Berton MountSylvester Delaney Perona, MD  [facility] Pager #: [phone] 7PM-7AM contact night coverage as above

## 2017-10-15 ENCOUNTER — Encounter (HOSPITAL_COMMUNITY): Payer: Self-pay | Admitting: Radiology

## 2017-10-15 DIAGNOSIS — R29898 Other symptoms and signs involving the musculoskeletal system: Secondary | ICD-10-CM

## 2017-10-15 DIAGNOSIS — M5387 Other specified dorsopathies, lumbosacral region: Secondary | ICD-10-CM

## 2017-10-15 DIAGNOSIS — Z419 Encounter for procedure for purposes other than remedying health state, unspecified: Secondary | ICD-10-CM

## 2017-10-15 MED FILL — Thrombin For Soln 20000 Unit: CUTANEOUS | Qty: 1 | Status: AC

## 2017-10-15 NOTE — Anesthesia Postprocedure Evaluation (Signed)
Anesthesia Post Note  Patient: Leafy HalfMelvin W Lazarz  Procedure(s) Performed: MRI WITH ANESTHESIA (N/A )     Patient location during evaluation: Radiology Anesthesia Type: General Level of consciousness: awake and alert Pain management: pain level controlled Vital Signs Assessment: post-procedure vital signs reviewed and stable Respiratory status: spontaneous breathing, nonlabored ventilation, respiratory function stable and patient connected to nasal cannula oxygen Cardiovascular status: blood pressure returned to baseline and stable Postop Assessment: no apparent nausea or vomiting Anesthetic complications: no    Last Vitals:  Vitals:   10/14/17 0506 10/14/17 0915  BP: (!) 121/39 (!) 121/39  Pulse: (!) 59 (!) 59  Resp: 16   Temp: 37 C   SpO2: 92%     Last Pain:  Vitals:   10/14/17 0506  TempSrc: Oral  PainSc:                  [place] Groutarignan, Shubham Thackston

## 2017-10-19 ENCOUNTER — Telehealth (INDEPENDENT_AMBULATORY_CARE_PROVIDER_SITE_OTHER): Payer: Self-pay | Admitting: Specialist

## 2017-10-19 NOTE — Telephone Encounter (Signed)
I called and gave verbal orders [facility] --lmom for him

## 2017-10-19 NOTE — Telephone Encounter (Signed)
[facility]  Once this wk and 2 wk 2    The number to contact Jeffrey Nelson is [phone]

## 2017-10-26 ENCOUNTER — Encounter (INDEPENDENT_AMBULATORY_CARE_PROVIDER_SITE_OTHER): Payer: Self-pay | Admitting: Specialist

## 2017-10-26 ENCOUNTER — Ambulatory Visit (INDEPENDENT_AMBULATORY_CARE_PROVIDER_SITE_OTHER): Payer: Medicare HMO | Admitting: Specialist

## 2017-10-26 VITALS — BP 104/54 | HR 64 | Ht 75.0 in | Wt 227.0 lb

## 2017-10-26 DIAGNOSIS — Z9889 Other specified postprocedural states: Secondary | ICD-10-CM

## 2017-10-26 NOTE — Discharge Summary (Signed)
Patient ID: Jeffrey Nelson MRN: [id number] DOB/AGE: [birth year] 83 y.o.  Admit date: 10/09/2017 Discharge date: 10/26/2017  Admission Diagnoses:  Principal Problem:   Lumbar disc herniation with radiculopathy Active Problems:   Radiculopathy, lumbar region   Pain in right hip   Right knee sprain   Hip pain, acute, right   Bradycardia   Diabetes mellitus type 2, uncomplicated (HCC)   Weakness of right lower extremity   Sciatica associated with disorder of lumbosacral spine   Surgery, elective   Discharge Diagnoses:  Principal Problem:   Lumbar disc herniation with radiculopathy Active Problems:   Radiculopathy, lumbar region   Pain in right hip   Right knee sprain   Hip pain, acute, right   Bradycardia   Diabetes mellitus type 2, uncomplicated (HCC)   Weakness of right lower extremity   Sciatica associated with disorder of lumbosacral spine   Surgery, elective  status post Procedure(s): MICRODISCECTOMY LUMBAR LAMINECTOMY RIGHT L3-4  Past Medical History:  Diagnosis Date  . Anginal pain (HCC)    occ  . Arthritis    "joints" (10/09/2017)  . Chronic lower back pain   . Complication of anesthesia    extremely claustrophobic; "have to be put down for MRI; don't sit in back seat of car;, etc."  . Coronary artery disease   . High cholesterol    "get shots twice/month" (10/09/2017)  . History of blood transfusion 2001   "related to CABG"  . History of kidney stones   . HOH (hard of hearing)    wearing hearing aids  . Hypertension   . Myocardial infarction (HCC) 2000s-06/2017 X 4  . Stroke [facility]) ~ 2015   "in my right eye; sight is coming back little by little" (10/09/2017)  . Type II diabetes mellitus (HCC)     Surgeries: Procedure(s): MICRODISCECTOMY LUMBAR LAMINECTOMY RIGHT L3-4 on 10/13/2017   Consultants: Treatment Team:  Mahala Menghini, MD  Discharged Condition: Improved  Hospital Course: Jeffrey Nelson is an 83 y.o. male who was admitted 10/09/2017 for  operative treatment of Lumbar disc herniation with radiculopathy. Patient failed conservative treatments (please see the history and physical for the specifics) and had severe unremitting pain that affects sleep, daily activities and work/hobbies. After pre-op clearance, the patient was taken to the operating room on 10/13/2017 and underwent  Procedure(s): MICRODISCECTOMY LUMBAR LAMINECTOMY RIGHT L3-4.    Patient was given perioperative antibiotics:  Anti-infectives (From admission, onward)   Start     Dose/Rate Route Frequency Ordered Stop   10/13/17 1800  ceFAZolin (ANCEF) IVPB 2g/100 mL premix     2 g 200 mL/hr over 30 Minutes Intravenous Every 8 hours 10/13/17 1359 10/14/17 0959   10/13/17 0600  ceFAZolin (ANCEF) IVPB 2g/100 mL premix     2 g 200 mL/hr over 30 Minutes Intravenous On call to O.R. 10/12/17 1943 10/13/17 1030       Patient was given sequential compression devices and early ambulation to prevent DVT.   Patient benefited maximally from hospital stay and there were no complications. At the time of discharge, the patient was urinating/moving their bowels without difficulty, tolerating a regular diet, pain is controlled with oral pain medications and they have been cleared by PT/OT.   Recent vital signs: No data found.   Recent laboratory studies: No results for input(s): WBC, HGB, HCT, PLT, NA, K, CL, CO2, BUN, CREATININE, GLUCOSE, INR, CALCIUM in the last 72 hours.  Invalid input(s): PT, 2   Discharge Medications:  Allergies as of 10/14/2017      Reactions   Ezetimibe Other (See Comments)   Cramps   Morphine And Related Other (See Comments)   Confusion   Statins Other (See Comments)   Cramps      Medication List    STOP taking these medications   amLODipine 5 MG tablet Commonly known as:  NORVASC   atenolol 50 MG tablet Commonly known as:  TENORMIN   methylPREDNISolone 4 MG Tbpk tablet Commonly known as:  MEDROL     TAKE these medications   aspirin EC  81 MG tablet Take 81 mg by mouth daily.   clopidogrel 75 MG tablet Commonly known as:  PLAVIX Take 75 mg by mouth daily.   COQ10 PO Take 1 tablet by mouth daily.   Cranberry 500 MG Tabs Take 500 mg by mouth daily.   isosorbide mononitrate 30 MG 24 hr tablet Commonly known as:  IMDUR Take 30 mg by mouth daily.   lisinopril 5 MG tablet Commonly known as:  PRINIVIL,ZESTRIL Take 5 mg by mouth daily.   metoprolol tartrate 50 MG tablet Commonly known as:  LOPRESSOR Take 50 mg by mouth 2 (two) times daily.   oxyCODONE-acetaminophen 5-325 MG tablet Commonly known as:  PERCOCET/ROXICET Take 1 tablet by mouth every 6 (six) hours as needed for severe pain.   REPATHA 140 MG/ML Sosy Generic drug:  Evolocumab Inject 140 mg into the skin every 14 (fourteen) days.   vitamin E 400 UNIT capsule Take 400 Units by mouth daily.   zolpidem 10 MG tablet Commonly known as:  AMBIEN Take 1 tablet (10 mg total) by mouth at bedtime as needed for sleep.       Diagnostic Studies: Dg Lumbar Spine 2-3 Views  Result Date: 10/13/2017 CLINICAL DATA:  Localization for L3-4 microdiscectomy. EXAM: LUMBAR SPINE - 2-3 VIEW COMPARISON:  10/05/2017 and MRI 10/12/2017 FINDINGS: Vertebral body alignment and heights are normal. There is mild spondylosis of the lumbar spine to include moderate facet arthropathy. There is moderate disc space narrowing at the L4-5 level. Initial image demonstrates a metallic surgical instrument with tip over the L3 spinous process. Subsequent images demonstrate metallic instruments with tip over the posterior elements at the L5 level and final image with metallic instrument over the posterior elements at the L4 level. Recommend correlation with findings at the time of the procedure. IMPRESSION: Mild spondylosis of the lumbar spine with disc disease at the L4-5 level. Metallic surgical instruments as described with the final image demonstrating metallic instrument with tip over the  posterior elements of the L4 level. Electronically Signed   By: Elberta Fortis M.D.   On: 10/13/2017 11:42   Mr Lumbar Spine W Wo Contrast  Result Date: 10/12/2017 CLINICAL DATA:  Back pain right leg pain with weakness. Recent falls. EXAM: MRI LUMBAR SPINE WITHOUT AND WITH CONTRAST TECHNIQUE: Multiplanar and multiecho pulse sequences of the lumbar spine were obtained without and with intravenous contrast. CONTRAST:  20mL MULTIHANCE GADOBENATE DIMEGLUMINE 529 MG/ML IV SOLN COMPARISON:  Lumbar MRI 11/19/2013 FINDINGS: Segmentation:  Normal Alignment: Mild retrolisthesis L2-3 and L3-4 which has developed since the prior MRI. Vertebrae: Negative for fracture or mass. Scattered small hemangiomata. Conus medullaris and cauda equina: Conus extends to the L1 level. Conus and cauda equina appear normal. Paraspinal and other soft tissues: Small gallstone. No retroperitoneal mass or adenopathy Disc levels: T12-L1: Mild disc degeneration and disc bulging L1-2: Mild disc degeneration and mild disc bulging L2-3: Mild disc degeneration and  disc bulging. Mild facet degeneration. Mild subarticular stenosis bilaterally. L3-4: Laminectomy on the right. Small extruded disc fragment on the right with upgoing disc material. Possible impingement right L3 nerve root. Mild facet degeneration. Moderate foraminal encroachment bilaterally. L4-5: Moderate disc degeneration left greater than right. Left-sided spurring with severe left foraminal encroachment and compression of the left L4 nerve root. Mild progression of left foraminal encroachment since the prior MRI. Posterior laminectomy on the left without significant spinal stenosis. Mild right foraminal narrowing L5-S1: Negative IMPRESSION: Mild subarticular stenosis bilaterally L2-3 Right laminectomy L3-4. Small extruded disc fragment on the right with upgoing disc material and impingement of the right L3 nerve root. Left laminectomy L4-5. Disc degeneration and spurring on the left with  left foraminal encroachment and compression of the left L4 nerve root. Electronically Signed   By: Marlan Palau M.D.   On: 10/12/2017 15:02   Mr Hip Right Wo Contrast  Result Date: 10/10/2017 EXAM: MR OF THE RIGHT HIP WITHOUT CONTRAST TECHNIQUE: Multiplanar, multisequence MR imaging was performed. No intravenous contrast was administered. COMPARISON:  None. FINDINGS: Patient motion degrades image quality limiting evaluation. Bones: No hip fracture, dislocation or avascular necrosis. No periosteal reaction or bone destruction. No aggressive osseous lesion. Normal sacrum and sacroiliac joints. No SI joint widening or erosive changes. Degenerative disc disease with disc height loss at L3-4 and L4-5. Articular cartilage and labrum Articular cartilage: High-grade partial-thickness cartilage loss of the right acetabulum and femoral head. High-grade partial-thickness cartilage loss of the left acetabulum and femoral head. Labrum: Right labral degeneration with a superior right labral tear and a 8 x 20 mm paralabral cyst. Joint or bursal effusion Joint effusion:  No hip joint effusion.  No SI joint effusion. Bursae:  No bursa formation. Muscles and tendons Flexors: Normal. Extensors: Normal. Abductors: Normal. Adductors: Normal. Gluteals: Normal. Hamstrings: Normal. Other findings Miscellaneous: No pelvic free fluid. No fluid collection or hematoma. No inguinal lymphadenopathy. No inguinal hernia. IMPRESSION: 1. Moderate osteoarthritis of the right hip. 2. No hip fracture, dislocation or avascular necrosis. 3. Lower lumbar spine spondylosis. Electronically Signed   By: Elige Ko   On: 10/10/2017 14:45   Xr Hip Unilat W Or W/o Pelvis 2-3 Views Right  Result Date: 10/09/2017 AP and lateral of the right hip shows right superior pubic ramus with a oblique Lucent line, nondisplaced appearance, the right femoral head and neck is normal and the right greater trochanter and intertrochanteric region without abnormality.    Xr Knee 1-2 Views Right  Result Date: 10/09/2017 AP and lateral radiographs of the right knee show a well seated right TKR without signs of lucency or loosening, there is a lucency of the lateral aspect of the distal flare of the metaphysis and medial Condyle no displacement on AP or lateral radiograph.  Xr Lumbar Spine 2-3 Views  Result Date: 10/05/2017 Radiographs of lumbar spine show grade 1 anteriolisthesis of L3 over L4 and L4 over L5. No acute finding.    Discharge Instructions    Call MD / Call 911   Complete by:  As directed    If you experience chest pain or shortness of breath, CALL 911 and be transported to the hospital emergency room.  If you develope a fever above 101 F, pus (white drainage) or increased drainage or redness at the wound, or calf pain, call your surgeon's office.   Call MD for:   Complete by:  As directed    Please call MD if symptoms worsen.   Constipation  Prevention   Complete by:  As directed    Drink plenty of fluids.  Prune juice may be helpful.  You may use a stool softener, such as Colace (over the counter) 100 mg twice a day.  Use MiraLax (over the counter) for constipation as needed.   Diet - low sodium heart healthy   Complete by:  As directed    Diet - low sodium heart healthy   Complete by:  As directed    Diet Carb Modified   Complete by:  As directed    Discharge instructions   Complete by:  As directed    No lifting greater than 10 lbs. Avoid bending, stooping and twisting. Walk in house for first week them may start to get out slowly increasing distance up to one mile by 3 weeks post op. Keep incision dry for 3 days, may use tegaderm or similar water impervious dressing. Your antihypertension medications have been adjusted while in the hospital so that your atenolol has been decreased to 25 mg per day and the norvasc increased to 10 mg per day. Prescriptions were sent to your pharmacy. You have adequate amounts of narcotics to take at  home and can resume these. Percocet and Ambien.   Driving restrictions   Complete by:  As directed    No driving for 2 weeks or for as long as you are taking narcotics to relieve your pain.   Increase activity slowly   Complete by:  As directed    Increase activity slowly as tolerated   Complete by:  As directed    Lifting restrictions   Complete by:  As directed    No lifting for 8 weeks      Follow-up Information    [facility] - Dme Follow up.   Why:  A representative from [facility] will contact you to arrange start date and time for your therapy. Contact information: [address] [place] [id number] [phone]        Kerrin ChampagneNitka, Jonnie Kubly E, MD In 2 weeks.   Specialty:  Orthopedic Surgery Why:  For wound re-check Contact information: [address] [place] [address] [phone]           Discharge Plan:  discharge to home  Disposition:     Signed: Zonia KiefJames Marabeth Melland  10/26/2017, 4:49 PM

## 2017-10-26 NOTE — Patient Instructions (Signed)
Plan:Avoid frequent bending and stooping  No lifting greater than 10 lbs. May use ice or moist heat for pain. Weight loss is of benefit. Handicap license is approved. 

## 2017-10-26 NOTE — Progress Notes (Signed)
Post-Op Visit Note   Patient: Jeffrey Nelson           Date of Birth: 1935-03-26           MRN: [id number] Visit Date: 10/26/2017 PCP: Oda Kilts, MD   Assessment & Plan:2 weeks post right L3-4 microdiscetomy for HNP  Chief Complaint:  Chief Complaint  Patient presents with  . Lower Back - Follow-up  Incision is healed. SLR neg. Right quad is weak 4/5. Remaining motor is normal.  Visit Diagnoses:  1. Status post lumbar laminectomy     Plan: Avoid frequent bending and stooping  No lifting greater than 10 lbs. May use ice or moist heat for pain. Weight loss is of benefit. Handicap license is approved.    Follow-Up Instructions: Return in about 3 weeks (around 11/16/2017).   Orders:  No orders of the defined types were placed in this encounter.  No orders of the defined types were placed in this encounter.   Imaging: No results found.  PMFS History: Patient Active Problem List   Diagnosis Date Noted  . Lumbar disc herniation with radiculopathy 10/13/2017    Priority: High    Class: Acute  . Radiculopathy, lumbar region 10/09/2017    Priority: High  . Pain in right hip 10/09/2017    Priority: High    Class: Acute  . Right knee sprain 10/09/2017    Priority: High    Class: Acute  . Spinal stenosis in cervical region 05/14/2015    Priority: High    Class: Chronic  . Cubital tunnel syndrome on left 05/14/2015    Priority: High    Class: Chronic  . Spinal stenosis, lumbar region, with neurogenic claudication 01/16/2014    Priority: High    Class: Chronic  . Diabetes mellitus type 2, uncomplicated (HCC) 10/14/2017    Priority: Medium    Class: Chronic  . Weakness of right lower extremity   . Sciatica associated with disorder of lumbosacral spine   . Surgery, elective   . Hip pain, acute, right 10/09/2017  . Bradycardia 10/09/2017  . Spondylolisthesis, lumbar region 10/05/2017  . Knee contusion 07/24/2016  . Stenosis of cervical spine with myelopathy  05/14/2015   Past Medical History:  Diagnosis Date  . Anginal pain (HCC)    occ  . Arthritis    "joints" (10/09/2017)  . Chronic lower back pain   . Complication of anesthesia    extremely claustrophobic; "have to be put down for MRI; don't sit in back seat of car;, etc."  . Coronary artery disease   . High cholesterol    "get shots twice/month" (10/09/2017)  . History of blood transfusion 2001   "related to CABG"  . History of kidney stones   . HOH (hard of hearing)    wearing hearing aids  . Hypertension   . Myocardial infarction (HCC) 2000s-06/2017 X 4  . Stroke [facility]) ~ 2015   "in my right eye; sight is coming back little by little" (10/09/2017)  . Type II diabetes mellitus (HCC)     No family history on file.  Past Surgical History:  Procedure Laterality Date  . ANTERIOR CERVICAL DECOMP/DISCECTOMY FUSION N/A 05/14/2015   Procedure: C3-4  ANTERIOR CERVICAL DISCECTOMY AND FUSION WITH PLATE AND SCREWS, LOCAL AND ALLOGRAFT BONE GRAFT;  Surgeon: Kerrin Champagne, MD;  Location: [facility] OR;  Service: Orthopedics;  Laterality: N/A;  . BACK SURGERY    . CARDIAC CATHETERIZATION     X 17 (10/09/2017)  .  CATARACT EXTRACTION W/ INTRAOCULAR LENS  IMPLANT, BILATERAL Bilateral   . COLONOSCOPY    . CORONARY ANGIOPLASTY  06/2017  . CORONARY ANGIOPLASTY WITH STENT PLACEMENT     "6 stents total" (10/09/2017)  . CORONARY ARTERY BYPASS GRAFT  2001   CABG X4  . CYSTOSCOPY W/ STONE MANIPULATION    . JOINT REPLACEMENT    . LUMBAR LAMINECTOMY Right 10/13/2017   Procedure: MICRODISCECTOMY LUMBAR LAMINECTOMY RIGHT L3-4;  Surgeon: Kerrin ChampagneNitka, Lissy Deuser E, MD;  [facility];  Service: Orthopedics;  Laterality: Right;  . LUMBAR LAMINECTOMY/DECOMPRESSION MICRODISCECTOMY N/A 01/16/2014   Procedure: Left L3-4 and L4-5 foraminotomies;  Surgeon: Kerrin ChampagneJames E Sentoria Brent, MD;  [facility];  Service: Orthopedics;  Laterality: N/A;  . NASAL FRACTURE SURGERY    . NASAL SINUS SURGERY     "several times since 1959"  . RADIOLOGY WITH  ANESTHESIA N/A 10/12/2017   Procedure: MRI WITH ANESTHESIA;  Surgeon: Radiologist, Medication, MD;  Location: [facility] OR;  Service: Radiology;  Laterality: N/A;  . SHOULDER OPEN ROTATOR CUFF REPAIR Bilateral   . TOTAL KNEE ARTHROPLASTY Right 2012  . TYMPANOSTOMY TUBE PLACEMENT Bilateral   . ULNAR NERVE TRANSPOSITION Left 05/14/2015   Procedure: LEFT ULNAR NERVE DECOMPRESSION AT THE ELBOW;  Surgeon: Kerrin ChampagneJames E Dalayna Lauter, MD;  [facility];  Service: Orthopedics;  Laterality: Left;   Social History   Occupational History  . Not on file  Tobacco Use  . Smoking status: Former Smoker    Packs/day: 1.00    Years: 2.00    Pack years: 2.00    Last attempt to quit: 01/09/1957    Years since quitting: 60.8  . Smokeless tobacco: Never Used  Substance and Sexual Activity  . Alcohol use: No  . Drug use: No  . Sexual activity: Not Currently

## 2017-10-29 ENCOUNTER — Other Ambulatory Visit (INDEPENDENT_AMBULATORY_CARE_PROVIDER_SITE_OTHER): Payer: Self-pay | Admitting: Specialist

## 2017-10-29 ENCOUNTER — Telehealth (INDEPENDENT_AMBULATORY_CARE_PROVIDER_SITE_OTHER): Payer: Self-pay

## 2017-10-29 MED ORDER — METHYLPREDNISOLONE 4 MG PO TBPK: ORAL_TABLET | ORAL | 0 refills | Status: DC

## 2017-10-29 NOTE — Telephone Encounter (Signed)
Patient's wife Lupita LeashDonna called stating that her husband is in a lot of pain.  Would like a call back as soon as possible to discuss.  Cb# is [phone].  Thank you.

## 2017-10-29 NOTE — Telephone Encounter (Addendum)
Ben R.physcial therapist with [facility] called stating that patient's pain is worse than before.  Stated that the pain started on Saturday and is worse when walking and standing.  Having radiating pain from surgical site. Patient has been using heat to relieve the pain.  Advised him that patient was seen on Friday by Dr. Otelia SergeantNitka.  Cb# for patient [phone].  Cb# for Romeo AppleBen is [phone].  Please advise.  Thank you.

## 2017-10-29 NOTE — Telephone Encounter (Signed)
I spoke with Dr. Otelia SergeantNitka, he is sending in a prednisone pak. I called and lmom to advise Lupita LeashDonna of this and to let her know he can take his pain meds.  Per Dr. Otelia SergeantNitka when he was in last week he was not using it.

## 2017-10-31 ENCOUNTER — Ambulatory Visit (INDEPENDENT_AMBULATORY_CARE_PROVIDER_SITE_OTHER): Payer: Medicare HMO

## 2017-10-31 ENCOUNTER — Ambulatory Visit (INDEPENDENT_AMBULATORY_CARE_PROVIDER_SITE_OTHER): Payer: Medicare HMO | Admitting: Specialist

## 2017-10-31 ENCOUNTER — Encounter (INDEPENDENT_AMBULATORY_CARE_PROVIDER_SITE_OTHER): Payer: Self-pay | Admitting: Specialist

## 2017-10-31 VITALS — BP 106/57 | HR 62 | Temp 97.3°F | Ht 74.0 in | Wt 210.0 lb

## 2017-10-31 DIAGNOSIS — M545 Low back pain: Secondary | ICD-10-CM

## 2017-10-31 DIAGNOSIS — R05 Cough: Secondary | ICD-10-CM

## 2017-10-31 DIAGNOSIS — M5416 Radiculopathy, lumbar region: Secondary | ICD-10-CM

## 2017-10-31 DIAGNOSIS — R058 Other specified cough: Secondary | ICD-10-CM

## 2017-10-31 MED ORDER — AZITHROMYCIN 250 MG PO TABS: ORAL_TABLET | ORAL | 0 refills | Status: DC

## 2017-10-31 MED ORDER — GABAPENTIN 300 MG PO CAPS: ORAL_CAPSULE | ORAL | 0 refills | Status: DC

## 2017-10-31 MED ORDER — OXYCODONE-ACETAMINOPHEN 5-325 MG PO TABS: 1.0000 | ORAL_TABLET | Freq: Four times a day (QID) | ORAL | 0 refills | Status: DC | PRN

## 2017-10-31 NOTE — Patient Instructions (Signed)
Plan: Avoid frequent bending and stooping  No lifting greater than 10 lbs. May use ice or moist heat for pain. Weight loss is of benefit. Handicap license is approved. CBC, Sed rate and CRP lab test drawn. CXR today

## 2017-10-31 NOTE — Progress Notes (Signed)
Post-Op Visit Note   Patient: Jeffrey Nelson           Date of Birth: [birth year]           MRN: [id number] Visit Date: 10/31/2017 PCP: Ursula Alert, MD   Assessment & Plan:  Chief Complaint:  Chief Complaint  Patient presents with  . Lower Back - Pain, Follow-up, Routine Post Op   Visit Diagnoses:  1. Low back pain, unspecified back pain laterality, unspecified chronicity, with sciatica presence unspecified   2. Lumbar radiculopathy, right   3. Productive cough   Incision is healing, lumbar pain with ROM and with right SLR and with hip flexion Radiographs with right mid and upper lung retrocardiac fluffy peribronchial changes possibly peribronchial infiltrates.  Productive cough.  Plan: Avoid frequent bending and stooping  No lifting greater than 10 lbs. May use ice or moist heat for pain. Weight loss is of benefit. Handicap license is approved.  CBC, Sed rate and CRP lab test drawn. CXR today  Follow-Up Instructions: Return in about 5 days (around 11/05/2017) for reassessment, over book..   Orders:  Orders Placed This Encounter  Procedures  . XR Lumbar Spine 2-3 Views  . XR Chest 2 View  . CBC with Differential/Platelet  . Sed Rate (ESR)  . C-reactive protein   Meds ordered this encounter  Medications  . gabapentin (NEURONTIN) 300 MG capsule    Sig: Take 1 capsule (300 mg total) by mouth at bedtime for 7 days, THEN 1 capsule (300 mg total) 2 (two) times daily for 28 days.    Dispense:  63 capsule    Refill:  0  . azithromycin (ZITHROMAX Z-PAK) 250 MG tablet    Sig: Take z pak as directed    Dispense:  6 each    Refill:  0    Imaging: Xr Lumbar Spine 2-3 Views  Result Date: 10/31/2017 AP and lateral flexion and extensin radiographs with degeneraitive disc narrowing L4-5 and L5-S1 greater than L3-4. There is grade 1 anterolisthesis L4-5 that is minimal 2-3 mm, unchanged from pre op.    PMFS History: Patient Active Problem List   Diagnosis Date Noted  .  Lumbar disc herniation with radiculopathy 10/13/2017    Priority: High    Class: Acute  . Radiculopathy, lumbar region 10/09/2017    Priority: High  . Pain in right hip 10/09/2017    Priority: High    Class: Acute  . Right knee sprain 10/09/2017    Priority: High    Class: Acute  . Spinal stenosis in cervical region 05/14/2015    Priority: High    Class: Chronic  . Cubital tunnel syndrome on left 05/14/2015    Priority: High    Class: Chronic  . Spinal stenosis, lumbar region, with neurogenic claudication 01/16/2014    Priority: High    Class: Chronic  . Diabetes mellitus type 2, uncomplicated ([place]) 97/35/3299    Priority: Medium    Class: Chronic  . Weakness of right lower extremity   . Sciatica associated with disorder of lumbosacral spine   . Surgery, elective   . Hip pain, acute, right 10/09/2017  . Bradycardia 10/09/2017  . Spondylolisthesis, lumbar region 10/05/2017  . Knee contusion 07/24/2016  . Stenosis of cervical spine with myelopathy 05/14/2015   Past Medical History:  Diagnosis Date  . Anginal pain ([place])    occ  . Arthritis    "joints" (10/09/2017)  . Chronic lower back pain   . Complication of  anesthesia    extremely claustrophobic; "have to be put down for MRI; don't sit in back seat of car;, etc."  . Coronary artery disease   . High cholesterol    "get shots twice/month" (10/09/2017)  . History of blood transfusion 2001   "related to CABG"  . History of kidney stones   . HOH (hard of hearing)    wearing hearing aids  . Hypertension   . Myocardial infarction ([place]) 2000s-06/2017 X 4  . Stroke [facility]) ~ 2015   "in my right eye; sight is coming back little by little" (10/09/2017)  . Type II diabetes mellitus ([place])     History reviewed. No pertinent family history.  Past Surgical History:  Procedure Laterality Date  . ANTERIOR CERVICAL DECOMP/DISCECTOMY FUSION N/A 05/14/2015   Procedure: C3-4  ANTERIOR CERVICAL DISCECTOMY AND FUSION WITH PLATE AND  SCREWS, LOCAL AND ALLOGRAFT BONE GRAFT;  Surgeon: Jessy Oto, MD;  Location: [place];  Service: Orthopedics;  Laterality: N/A;  . BACK SURGERY    . CARDIAC CATHETERIZATION     X 17 (10/09/2017)  . CATARACT EXTRACTION W/ INTRAOCULAR LENS  IMPLANT, BILATERAL Bilateral   . COLONOSCOPY    . CORONARY ANGIOPLASTY  06/2017  . CORONARY ANGIOPLASTY WITH STENT PLACEMENT     "6 stents total" (10/09/2017)  . CORONARY ARTERY BYPASS GRAFT  2001   CABG X4  . CYSTOSCOPY W/ STONE MANIPULATION    . JOINT REPLACEMENT    . LUMBAR LAMINECTOMY Right 10/13/2017   Procedure: MICRODISCECTOMY LUMBAR LAMINECTOMY RIGHT L3-4;  Surgeon: Jessy Oto, MD;  Location: [place];  Service: Orthopedics;  Laterality: Right;  . LUMBAR LAMINECTOMY/DECOMPRESSION MICRODISCECTOMY N/A 01/16/2014   Procedure: Left L3-4 and L4-5 foraminotomies;  Surgeon: Jessy Oto, MD;  Location: [place];  Service: Orthopedics;  Laterality: N/A;  . NASAL FRACTURE SURGERY    . NASAL SINUS SURGERY     "several times since 1959"  . RADIOLOGY WITH ANESTHESIA N/A 10/12/2017   Procedure: MRI WITH ANESTHESIA;  Surgeon: Radiologist, Medication, MD;  Location: [place];  Service: Radiology;  Laterality: N/A;  . SHOULDER OPEN ROTATOR CUFF REPAIR Bilateral   . TOTAL KNEE ARTHROPLASTY Right 2012  . TYMPANOSTOMY TUBE PLACEMENT Bilateral   . ULNAR NERVE TRANSPOSITION Left 05/14/2015   Procedure: LEFT ULNAR NERVE DECOMPRESSION AT THE ELBOW;  Surgeon: Jessy Oto, MD;  Location: [place];  Service: Orthopedics;  Laterality: Left;   Social History   Occupational History  . Not on file  Tobacco Use  . Smoking status: Former Smoker    Packs/day: 1.00    Years: 2.00    Pack years: 2.00    Last attempt to quit: 01/09/1957    Years since quitting: 60.8  . Smokeless tobacco: Never Used  Substance and Sexual Activity  . Alcohol use: No  . Drug use: No  . Sexual activity: Not Currently

## 2017-11-01 ENCOUNTER — Ambulatory Visit (INDEPENDENT_AMBULATORY_CARE_PROVIDER_SITE_OTHER): Payer: Medicare HMO | Admitting: Specialist

## 2017-11-01 ENCOUNTER — Other Ambulatory Visit (INDEPENDENT_AMBULATORY_CARE_PROVIDER_SITE_OTHER): Payer: Self-pay | Admitting: Specialist

## 2017-11-01 DIAGNOSIS — M5441 Lumbago with sciatica, right side: Secondary | ICD-10-CM

## 2017-11-01 LAB — CBC WITH DIFFERENTIAL/PLATELET
BASOS ABS: 45 {cells}/uL (ref 0–200)
Basophils Relative: 0.3 %
EOS ABS: 0 {cells}/uL — AB (ref 15–500)
Eosinophils Relative: 0 %
HCT: 41.3 % (ref 38.5–50.0)
Hemoglobin: 14.1 g/dL (ref 13.2–17.1)
Lymphs Abs: 1475 cells/uL (ref 850–3900)
MCH: 32.6 pg (ref 27.0–33.0)
MCHC: 34.1 g/dL (ref 32.0–36.0)
MCV: 95.6 fL (ref 80.0–100.0)
MONOS PCT: 10.7 %
MPV: 10 fL (ref 7.5–12.5)
Neutro Abs: 11786 cells/uL — ABNORMAL HIGH (ref 1500–7800)
Neutrophils Relative %: 79.1 %
PLATELETS: 507 10*3/uL — AB (ref 140–400)
RBC: 4.32 10*6/uL (ref 4.20–5.80)
RDW: 12 % (ref 11.0–15.0)
TOTAL LYMPHOCYTE: 9.9 %
WBC mixed population: 1594 cells/uL — ABNORMAL HIGH (ref 200–950)
WBC: 14.9 10*3/uL — ABNORMAL HIGH (ref 3.8–10.8)

## 2017-11-01 LAB — SEDIMENTATION RATE: SED RATE: 74 mm/h — AB (ref 0–20)

## 2017-11-01 LAB — C-REACTIVE PROTEIN: CRP: 56.4 mg/L — AB (ref <8.0)

## 2017-11-01 NOTE — Progress Notes (Signed)
WBC is elevated at 14.9 increased from 13.0 on 2/13, but he did start a steroid dose pak, CRP is elevated at 56.4 and sed rate 76. CXR does appear to be with infiltrate. I called and spoke with Jeffrey Nelson's wife and she reports that he is using a blanket Today and the house is warm. The pain in his right leg is worsening and he is having persistent weakness in the right foot dorsiflexion. I will order an MRI of the Lumbar spine with and without enhancement to assess for sign of a discitis of infection vs recurrent HNP.

## 2017-11-02 ENCOUNTER — Telehealth (INDEPENDENT_AMBULATORY_CARE_PROVIDER_SITE_OTHER): Payer: Self-pay | Admitting: Specialist

## 2017-11-02 NOTE — Telephone Encounter (Signed)
Thayer OhmChris from [facility] called asking for verbal approval on PT 2 week 2 and 1 week 1. CB # [phone]

## 2017-11-05 ENCOUNTER — Telehealth (INDEPENDENT_AMBULATORY_CARE_PROVIDER_SITE_OTHER): Payer: Self-pay | Admitting: Specialist

## 2017-11-05 ENCOUNTER — Ambulatory Visit (INDEPENDENT_AMBULATORY_CARE_PROVIDER_SITE_OTHER): Payer: Medicare HMO | Admitting: Specialist

## 2017-11-05 NOTE — Telephone Encounter (Signed)
Patients wife called on patients behalf stating she had personally spoke to D. Nitka the other day and he wanted the patient to get a MRI but he has an appointment today, should he still come in without an MRI or reschedule? CB # [phone]

## 2017-11-05 NOTE — Telephone Encounter (Signed)
I called and spoke with Jeffrey Nelson, advised her to get MRI scheduled and call me back when she knows when the scan will be and I will get them back on the schedule to be seen.

## 2017-11-05 NOTE — Telephone Encounter (Signed)
[facility]mhris, [facility] called to check on the status of the VO.  Thank you.

## 2017-11-06 ENCOUNTER — Ambulatory Visit (INDEPENDENT_AMBULATORY_CARE_PROVIDER_SITE_OTHER): Payer: Medicare HMO | Admitting: Specialist

## 2017-11-06 ENCOUNTER — Encounter (INDEPENDENT_AMBULATORY_CARE_PROVIDER_SITE_OTHER): Payer: Self-pay | Admitting: Specialist

## 2017-11-06 VITALS — BP 93/56 | HR 57 | Temp 98.0°F | Ht 74.0 in | Wt 210.0 lb

## 2017-11-06 DIAGNOSIS — Z9889 Other specified postprocedural states: Secondary | ICD-10-CM

## 2017-11-06 DIAGNOSIS — M5441 Lumbago with sciatica, right side: Secondary | ICD-10-CM

## 2017-11-06 MED ORDER — DIAZEPAM 5 MG PO TABS: ORAL_TABLET | ORAL | 0 refills | Status: DC

## 2017-11-06 MED ORDER — OXYCODONE HCL ER 10 MG PO T12A: 10.0000 mg | EXTENDED_RELEASE_TABLET | Freq: Two times a day (BID) | ORAL | 0 refills | Status: DC

## 2017-11-06 NOTE — Telephone Encounter (Signed)
We are going to see pt on 11/06/17

## 2017-11-06 NOTE — Progress Notes (Signed)
Post-Op Visit Note   Patient: Jeffrey Nelson           Date of Birth: 1934-11-22           MRN: [id number] Visit Date: 11/06/2017 PCP: Oda KiltsBrown, Sherry, MD   Assessment & Plan:3 1/2 weeks post right L3-4 microdiscectomy for HNP and right L4 and L3 nerve compression with recurrent pain in the back and radiation into the right thigh.   Chief Complaint:  Chief Complaint  Patient presents with  . Lower Back - Follow-up    Says he is doing worse. It's all he can do to stand up.  Pain in the right thigh is better but the back pain is still uncomfortable. Walking "I really really hurt" I was walking with a cane to the bathroom.  SLR negative both side. Incision is healed no erythrema, no fluctuance and on drainage.  No warmth. Afebrile, off medrol dose pak last one week.  Visit Diagnoses: No diagnosis found.  Plan: Avoid frequent bending and stooping  No lifting greater than 10 lbs. May use ice or moist heat for pain. Weight loss is of benefit. Handicap license is approved. MRI of the lumbar spine ordered with and without enhancement.  Follow-Up Instructions: No Follow-up on file.   Orders:  No orders of the defined types were placed in this encounter.  No orders of the defined types were placed in this encounter.   Imaging: No results found.  PMFS History: Patient Active Problem List   Diagnosis Date Noted  . Lumbar disc herniation with radiculopathy 10/13/2017    Priority: High    Class: Acute  . Radiculopathy, lumbar region 10/09/2017    Priority: High  . Pain in right hip 10/09/2017    Priority: High    Class: Acute  . Right knee sprain 10/09/2017    Priority: High    Class: Acute  . Spinal stenosis in cervical region 05/14/2015    Priority: High    Class: Chronic  . Cubital tunnel syndrome on left 05/14/2015    Priority: High    Class: Chronic  . Spinal stenosis, lumbar region, with neurogenic claudication 01/16/2014    Priority: High    Class: Chronic  .  Diabetes mellitus type 2, uncomplicated ([facility]) 10/14/2017    Priority: Medium    Class: Chronic  . Weakness of right lower extremity   . Sciatica associated with disorder of lumbosacral spine   . Surgery, elective   . Hip pain, acute, right 10/09/2017  . Bradycardia 10/09/2017  . Spondylolisthesis, lumbar region 10/05/2017  . Knee contusion 07/24/2016  . Stenosis of cervical spine with myelopathy 05/14/2015   Past Medical History:  Diagnosis Date  . Anginal pain ([facility])    occ  . Arthritis    "joints" (10/09/2017)  . Chronic lower back pain   . Complication of anesthesia    extremely claustrophobic; "have to be put down for MRI; don't sit in back seat of car;, etc."  . Coronary artery disease   . High cholesterol    "get shots twice/month" (10/09/2017)  . History of blood transfusion 2001   "related to CABG"  . History of kidney stones   . HOH (hard of hearing)    wearing hearing aids  . Hypertension   . Myocardial infarction ([facility]) 2000s-06/2017 X 4  . Stroke [facility]) ~ 2015   "in my right eye; sight is coming back little by little" (10/09/2017)  . Type II diabetes mellitus ([facility])  No family history on file.  Past Surgical History:  Procedure Laterality Date  . ANTERIOR CERVICAL DECOMP/DISCECTOMY FUSION N/A 05/14/2015   Procedure: C3-4  ANTERIOR CERVICAL DISCECTOMY AND FUSION WITH PLATE AND SCREWS, LOCAL AND ALLOGRAFT BONE GRAFT;  Surgeon: Kerrin Champagne, MD;  [facility];  Service: Orthopedics;  Laterality: N/A;  . BACK SURGERY    . CARDIAC CATHETERIZATION     X 17 (10/09/2017)  . CATARACT EXTRACTION W/ INTRAOCULAR LENS  IMPLANT, BILATERAL Bilateral   . COLONOSCOPY    . CORONARY ANGIOPLASTY  06/2017  . CORONARY ANGIOPLASTY WITH STENT PLACEMENT     "6 stents total" (10/09/2017)  . CORONARY ARTERY BYPASS GRAFT  2001   CABG X4  . CYSTOSCOPY W/ STONE MANIPULATION    . JOINT REPLACEMENT    . LUMBAR LAMINECTOMY Right 10/13/2017   Procedure: MICRODISCECTOMY LUMBAR LAMINECTOMY  RIGHT L3-4;  Surgeon: Kerrin Champagne, MD;  Location: [facility] OR;  Service: Orthopedics;  Laterality: Right;  . LUMBAR LAMINECTOMY/DECOMPRESSION MICRODISCECTOMY N/A 01/16/2014   Procedure: Left L3-4 and L4-5 foraminotomies;  Surgeon: Kerrin Champagne, MD;  Location: [facility] OR;  Service: Orthopedics;  Laterality: N/A;  . NASAL FRACTURE SURGERY    . NASAL SINUS SURGERY     "several times since 1959"  . RADIOLOGY WITH ANESTHESIA N/A 10/12/2017   Procedure: MRI WITH ANESTHESIA;  Surgeon: Radiologist, Medication, MD;  Location: [facility] OR;  Service: Radiology;  Laterality: N/A;  . SHOULDER OPEN ROTATOR CUFF REPAIR Bilateral   . TOTAL KNEE ARTHROPLASTY Right 2012  . TYMPANOSTOMY TUBE PLACEMENT Bilateral   . ULNAR NERVE TRANSPOSITION Left 05/14/2015   Procedure: LEFT ULNAR NERVE DECOMPRESSION AT THE ELBOW;  Surgeon: Kerrin Champagne, MD;  Location: [facility] OR;  Service: Orthopedics;  Laterality: Left;   Social History   Occupational History  . Not on file  Tobacco Use  . Smoking status: Former Smoker    Packs/day: 1.00    Years: 2.00    Pack years: 2.00    Last attempt to quit: 01/09/1957    Years since quitting: 60.8  . Smokeless tobacco: Never Used  Substance and Sexual Activity  . Alcohol use: No  . Drug use: No  . Sexual activity: Not Currently

## 2017-11-06 NOTE — Patient Instructions (Addendum)
Plan: Avoid frequent bending and stooping  No lifting greater than 10 lbs. May use ice or moist heat for pain. Weight loss is of benefit. Handicap license is approved. MRI of the lumbar spine ordered with and without enhancement.

## 2017-11-07 LAB — CBC WITH DIFFERENTIAL/PLATELET
BASOS PCT: 0.6 %
Basophils Absolute: 74 cells/uL (ref 0–200)
EOS PCT: 0.9 %
Eosinophils Absolute: 112 cells/uL (ref 15–500)
HCT: 38.8 % (ref 38.5–50.0)
Hemoglobin: 13.6 g/dL (ref 13.2–17.1)
Lymphs Abs: 1761 cells/uL (ref 850–3900)
MCH: 33.2 pg — ABNORMAL HIGH (ref 27.0–33.0)
MCHC: 35.1 g/dL (ref 32.0–36.0)
MCV: 94.6 fL (ref 80.0–100.0)
MONOS PCT: 10.4 %
MPV: 9.9 fL (ref 7.5–12.5)
NEUTROS PCT: 73.9 %
Neutro Abs: 9164 cells/uL — ABNORMAL HIGH (ref 1500–7800)
PLATELETS: 383 10*3/uL (ref 140–400)
RBC: 4.1 10*6/uL — AB (ref 4.20–5.80)
RDW: 12 % (ref 11.0–15.0)
TOTAL LYMPHOCYTE: 14.2 %
WBC mixed population: 1290 cells/uL — ABNORMAL HIGH (ref 200–950)
WBC: 12.4 10*3/uL — AB (ref 3.8–10.8)

## 2017-11-07 LAB — SEDIMENTATION RATE: SED RATE: 51 mm/h — AB (ref 0–20)

## 2017-11-07 LAB — C-REACTIVE PROTEIN: CRP: 27.8 mg/L — AB (ref <8.0)

## 2017-11-07 NOTE — Telephone Encounter (Signed)
I called and lmom for Thayer OhmChris giving Verbal ok for his orders

## 2017-11-09 ENCOUNTER — Other Ambulatory Visit (INDEPENDENT_AMBULATORY_CARE_PROVIDER_SITE_OTHER): Payer: Self-pay | Admitting: Specialist

## 2017-11-09 ENCOUNTER — Telehealth (INDEPENDENT_AMBULATORY_CARE_PROVIDER_SITE_OTHER): Payer: Self-pay | Admitting: *Deleted

## 2017-11-09 DIAGNOSIS — M5441 Lumbago with sciatica, right side: Secondary | ICD-10-CM

## 2017-11-09 NOTE — Telephone Encounter (Signed)
Pt wife Lupita LeashDonna called wanting to know if [facility] can get pt in sooner than Mar [facility]th with [facility] due to wanting to be sedated. Wife states he has a valium and would like to just take that and get it done. I called imaging and sw [place] and we got pt scheduled for Mar 23th at 510p. I had pt on hold [facility] and advised her of the appt and agrees with the sooner appt.

## 2017-11-11 ENCOUNTER — Encounter (HOSPITAL_COMMUNITY): Payer: Self-pay

## 2017-11-11 ENCOUNTER — Other Ambulatory Visit: Payer: Self-pay

## 2017-11-11 ENCOUNTER — Inpatient Hospital Stay (HOSPITAL_COMMUNITY)
Admission: EM | Admit: 2017-11-11 | Discharge: 2017-11-14 | DRG: 520 | Disposition: A | Discharge status: Home Health | Payer: MEDICARE | Attending: Specialist | Admitting: Specialist

## 2017-11-11 ENCOUNTER — Emergency Department (HOSPITAL_COMMUNITY): Payer: MEDICARE

## 2017-11-11 DIAGNOSIS — Z87891 Personal history of nicotine dependence: Secondary | ICD-10-CM

## 2017-11-11 DIAGNOSIS — Z7902 Long term (current) use of antithrombotics/antiplatelets: Secondary | ICD-10-CM | POA: Diagnosis not present

## 2017-11-11 DIAGNOSIS — Z9842 Cataract extraction status, left eye: Secondary | ICD-10-CM

## 2017-11-11 DIAGNOSIS — Z7982 Long term (current) use of aspirin: Secondary | ICD-10-CM | POA: Diagnosis not present

## 2017-11-11 DIAGNOSIS — I10 Essential (primary) hypertension: Secondary | ICD-10-CM | POA: Diagnosis present

## 2017-11-11 DIAGNOSIS — M5116 Intervertebral disc disorders with radiculopathy, lumbar region: Principal | ICD-10-CM | POA: Diagnosis present

## 2017-11-11 DIAGNOSIS — Z9841 Cataract extraction status, right eye: Secondary | ICD-10-CM

## 2017-11-11 DIAGNOSIS — M5416 Radiculopathy, lumbar region: Secondary | ICD-10-CM

## 2017-11-11 DIAGNOSIS — Z951 Presence of aortocoronary bypass graft: Secondary | ICD-10-CM | POA: Diagnosis not present

## 2017-11-11 DIAGNOSIS — I252 Old myocardial infarction: Secondary | ICD-10-CM | POA: Diagnosis not present

## 2017-11-11 DIAGNOSIS — F40240 Claustrophobia: Secondary | ICD-10-CM | POA: Diagnosis present

## 2017-11-11 DIAGNOSIS — Z885 Allergy status to narcotic agent status: Secondary | ICD-10-CM

## 2017-11-11 DIAGNOSIS — H9190 Unspecified hearing loss, unspecified ear: Secondary | ICD-10-CM | POA: Diagnosis present

## 2017-11-11 DIAGNOSIS — Z974 Presence of external hearing-aid: Secondary | ICD-10-CM

## 2017-11-11 DIAGNOSIS — I2510 Atherosclerotic heart disease of native coronary artery without angina pectoris: Secondary | ICD-10-CM | POA: Diagnosis present

## 2017-11-11 DIAGNOSIS — Z79899 Other long term (current) drug therapy: Secondary | ICD-10-CM | POA: Diagnosis not present

## 2017-11-11 DIAGNOSIS — Z981 Arthrodesis status: Secondary | ICD-10-CM | POA: Diagnosis not present

## 2017-11-11 DIAGNOSIS — Z419 Encounter for procedure for purposes other than remedying health state, unspecified: Secondary | ICD-10-CM

## 2017-11-11 DIAGNOSIS — Z8673 Personal history of transient ischemic attack (TIA), and cerebral infarction without residual deficits: Secondary | ICD-10-CM | POA: Diagnosis not present

## 2017-11-11 DIAGNOSIS — Z888 Allergy status to other drugs, medicaments and biological substances status: Secondary | ICD-10-CM | POA: Diagnosis not present

## 2017-11-11 DIAGNOSIS — E119 Type 2 diabetes mellitus without complications: Secondary | ICD-10-CM | POA: Diagnosis not present

## 2017-11-11 DIAGNOSIS — Z961 Presence of intraocular lens: Secondary | ICD-10-CM | POA: Diagnosis not present

## 2017-11-11 DIAGNOSIS — M1991 Primary osteoarthritis, unspecified site: Secondary | ICD-10-CM | POA: Diagnosis present

## 2017-11-11 DIAGNOSIS — Z7951 Long term (current) use of inhaled steroids: Secondary | ICD-10-CM | POA: Diagnosis not present

## 2017-11-11 DIAGNOSIS — Z9889 Other specified postprocedural states: Secondary | ICD-10-CM

## 2017-11-11 DIAGNOSIS — G8918 Other acute postprocedural pain: Secondary | ICD-10-CM | POA: Diagnosis present

## 2017-11-11 LAB — CBC WITH DIFFERENTIAL/PLATELET
Basophils Absolute: 0 10*3/uL (ref 0.0–0.1)
Basophils Relative: 0 %
EOS PCT: 1 %
Eosinophils Absolute: 0.1 10*3/uL (ref 0.0–0.7)
HEMATOCRIT: 36.3 % — AB (ref 39.0–52.0)
Hemoglobin: 12.1 g/dL — ABNORMAL LOW (ref 13.0–17.0)
LYMPHS ABS: 2.2 10*3/uL (ref 0.7–4.0)
LYMPHS PCT: 23 %
MCH: 32 pg (ref 26.0–34.0)
MCHC: 33.3 g/dL (ref 30.0–36.0)
MCV: 96 fL (ref 78.0–100.0)
MONO ABS: 1.2 10*3/uL — AB (ref 0.1–1.0)
MONOS PCT: 12 %
NEUTROS ABS: 6.2 10*3/uL (ref 1.7–7.7)
Neutrophils Relative %: 64 %
PLATELETS: 292 10*3/uL (ref 150–400)
RBC: 3.78 MIL/uL — ABNORMAL LOW (ref 4.22–5.81)
RDW: 12.5 % (ref 11.5–15.5)
WBC: 9.7 10*3/uL (ref 4.0–10.5)

## 2017-11-11 LAB — URINALYSIS, ROUTINE W REFLEX MICROSCOPIC
Bilirubin Urine: NEGATIVE
GLUCOSE, UA: NEGATIVE mg/dL
HGB URINE DIPSTICK: NEGATIVE
Ketones, ur: NEGATIVE mg/dL
LEUKOCYTES UA: NEGATIVE
Nitrite: NEGATIVE
PROTEIN: NEGATIVE mg/dL
SPECIFIC GRAVITY, URINE: 1.013 (ref 1.005–1.030)
pH: 5 (ref 5.0–8.0)

## 2017-11-11 LAB — COMPREHENSIVE METABOLIC PANEL
ALT: 19 U/L (ref 17–63)
AST: 23 U/L (ref 15–41)
Albumin: 3.1 g/dL — ABNORMAL LOW (ref 3.5–5.0)
Alkaline Phosphatase: 122 U/L (ref 38–126)
Anion gap: 7 (ref 5–15)
BILIRUBIN TOTAL: 0.7 mg/dL (ref 0.3–1.2)
BUN: 13 mg/dL (ref 6–20)
CHLORIDE: 101 mmol/L (ref 101–111)
CO2: 25 mmol/L (ref 22–32)
Calcium: 8.9 mg/dL (ref 8.9–10.3)
Creatinine, Ser: 0.78 mg/dL (ref 0.61–1.24)
Glucose, Bld: 106 mg/dL — ABNORMAL HIGH (ref 65–99)
POTASSIUM: 3.9 mmol/L (ref 3.5–5.1)
Sodium: 133 mmol/L — ABNORMAL LOW (ref 135–145)
TOTAL PROTEIN: 6.5 g/dL (ref 6.5–8.1)

## 2017-11-11 LAB — SEDIMENTATION RATE: Sed Rate: 52 mm/hr — ABNORMAL HIGH (ref 0–16)

## 2017-11-11 LAB — C-REACTIVE PROTEIN: CRP: 1.4 mg/dL — ABNORMAL HIGH (ref <1.0)

## 2017-11-11 MED ORDER — BISACODYL 10 MG RE SUPP: 10.0000 mg | Freq: Every day | RECTAL | Status: DC | PRN

## 2017-11-11 MED ORDER — SODIUM CHLORIDE 0.9 % IV BOLUS (SEPSIS)
500.0000 mL | Freq: Once | INTRAVENOUS | Status: AC
  Administered 2017-11-11: 500 mL via INTRAVENOUS

## 2017-11-11 MED ORDER — ACETAMINOPHEN 325 MG PO TABS
650.0000 mg | ORAL_TABLET | Freq: Four times a day (QID) | ORAL | Status: DC | PRN
  Administered 2017-11-11: 650 mg via ORAL
  Filled 2017-11-11: qty 2

## 2017-11-11 MED ORDER — GADOBENATE DIMEGLUMINE 529 MG/ML IV SOLN
20.0000 mL | Freq: Once | INTRAVENOUS | Status: AC | PRN
  Administered 2017-11-11: 19 mL via INTRAVENOUS

## 2017-11-11 MED ORDER — DEXTROSE-NACL 5-0.45 % IV SOLN
INTRAVENOUS | Status: DC
  Administered 2017-11-11 – 2017-11-12 (×2): via INTRAVENOUS

## 2017-11-11 MED ORDER — HYDROMORPHONE HCL 1 MG/ML IJ SOLN
0.5000 mg | INTRAMUSCULAR | Status: DC | PRN
  Administered 2017-11-12: 0.5 mg via INTRAVENOUS
  Filled 2017-11-11 (×2): qty 1

## 2017-11-11 MED ORDER — ZOLPIDEM TARTRATE 5 MG PO TABS
5.0000 mg | ORAL_TABLET | Freq: Once | ORAL | Status: AC
  Administered 2017-11-11: 5 mg via ORAL
  Filled 2017-11-11: qty 1

## 2017-11-11 MED ORDER — FLEET ENEMA 7-19 GM/118ML RE ENEM: 1.0000 | ENEMA | Freq: Once | RECTAL | Status: DC | PRN

## 2017-11-11 MED ORDER — LORAZEPAM 2 MG/ML IJ SOLN
1.0000 mg | Freq: Once | INTRAMUSCULAR | Status: AC
  Administered 2017-11-11: 1 mg via INTRAVENOUS
  Filled 2017-11-11: qty 1

## 2017-11-11 MED ORDER — LORAZEPAM 2 MG/ML IJ SOLN
1.0000 mg | Freq: Once | INTRAMUSCULAR | Status: AC
Start: 2017-11-11 — End: 2017-11-11
  Administered 2017-11-11: 1 mg via INTRAVENOUS
  Filled 2017-11-11: qty 1

## 2017-11-11 MED ORDER — DOCUSATE SODIUM 100 MG PO CAPS
100.0000 mg | ORAL_CAPSULE | Freq: Two times a day (BID) | ORAL | Status: DC
  Administered 2017-11-11 – 2017-11-12 (×2): 100 mg via ORAL
  Filled 2017-11-11 (×2): qty 1

## 2017-11-11 MED ORDER — POLYETHYLENE GLYCOL 3350 17 G PO PACK: 17.0000 g | PACK | Freq: Every day | ORAL | Status: DC | PRN

## 2017-11-11 MED ORDER — HYDROMORPHONE HCL 1 MG/ML IJ SOLN
0.5000 mg | Freq: Once | INTRAMUSCULAR | Status: AC
  Administered 2017-11-11: 0.5 mg via INTRAVENOUS
  Filled 2017-11-11: qty 1

## 2017-11-11 MED ORDER — KETOROLAC TROMETHAMINE 15 MG/ML IJ SOLN
7.5000 mg | Freq: Four times a day (QID) | INTRAMUSCULAR | Status: DC
  Administered 2017-11-11 – 2017-11-12 (×4): 7.5 mg via INTRAVENOUS
  Filled 2017-11-11 (×4): qty 1

## 2017-11-11 MED ORDER — ACETAMINOPHEN 650 MG RE SUPP: 650.0000 mg | Freq: Four times a day (QID) | RECTAL | Status: DC | PRN

## 2017-11-11 MED ORDER — OXYCODONE HCL 5 MG PO TABS
5.0000 mg | ORAL_TABLET | ORAL | Status: DC | PRN
  Administered 2017-11-11 – 2017-11-12 (×2): 10 mg via ORAL
  Filled 2017-11-11 (×2): qty 2

## 2017-11-11 NOTE — ED Notes (Signed)
MRI staff called to report that patient wanted some more pain meds and something anxiety for MRI

## 2017-11-11 NOTE — ED Notes (Signed)
PT still in MRI-med administered at MRI

## 2017-11-11 NOTE — ED Provider Notes (Signed)
[facility]ENC[facility] Provider Note   CSN: [id number] Arrival date & time: 11/11/17  [id number]     History   Chief Complaint Chief Complaint  Patient presents with  . Post-op Problem    HPI Jeffrey Nelson is a 83 y.o. male.  HPI Patient with a history of chronic low back pain underwent lumbar microdiscectomy 1 month ago.  Has had ongoing pain since.  States prior to surgery was having radiculopathy down the right leg and now is having down the left leg.  Having some difficulty urinating and having bowel movements but he attributes this to the increased amount of narcotics he is taking.  Denies fever, numbness or weakness.  Has outpatient MRI scheduled for later this month but due to increased pain, orthopedist on-call referred to emergency department.  Past Medical History:  Diagnosis Date  . Anginal pain (HCC)    occ  . Arthritis    "joints" (10/09/2017)  . Chronic lower back pain   . Complication of anesthesia    extremely claustrophobic; "have to be put down for MRI; don't sit in back seat of car;, etc."  . Coronary artery disease   . High cholesterol    "get shots twice/month" (10/09/2017)  . History of blood transfusion 2001   "related to CABG"  . History of kidney stones   . HOH (hard of hearing)    wearing hearing aids  . Hypertension   . Myocardial infarction (HCC) 2000s-06/2017 X 4  . Stroke [facility](HCC) ~ 2015   "in my right eye; sight is coming back little by little" (10/09/2017)  . Type II diabetes mellitus [facility](HCC)     Patient Active Problem List   Diagnosis Date Noted  . Postoperative pain after spinal surgery 11/11/2017  . Weakness of right lower extremity   . Sciatica associated with disorder of lumbosacral spine   . Surgery, elective   . Diabetes mellitus type 2, uncomplicated (HCC) 10/14/2017    Class: Chronic  . Lumbar disc herniation with radiculopathy 10/13/2017    Class: Acute  . Radiculopathy, lumbar region 10/09/2017  . Pain  in right hip 10/09/2017    Class: Acute  . Right knee sprain 10/09/2017    Class: Acute  . Hip pain, acute, right 10/09/2017  . Bradycardia 10/09/2017  . Spondylolisthesis, lumbar region 10/05/2017  . Knee contusion 07/24/2016  . Spinal stenosis in cervical region 05/14/2015    Class: Chronic  . Cubital tunnel syndrome on left 05/14/2015    Class: Chronic  . Stenosis of cervical spine with myelopathy 05/14/2015  . Spinal stenosis, lumbar region, with neurogenic claudication 01/16/2014    Class: Chronic    Past Surgical History:  Procedure Laterality Date  . ANTERIOR CERVICAL DECOMP/DISCECTOMY FUSION N/A 05/14/2015   Procedure: C3-4  ANTERIOR CERVICAL DISCECTOMY AND FUSION WITH PLATE AND SCREWS, LOCAL AND ALLOGRAFT BONE GRAFT;  Surgeon: Kerrin ChampagneJames E Nitka, MD;  Location: [facility] OR;  Service: Orthopedics;  Laterality: N/A;  . BACK SURGERY    . CARDIAC CATHETERIZATION     X 17 (10/09/2017)  . CATARACT EXTRACTION W/ INTRAOCULAR LENS  IMPLANT, BILATERAL Bilateral   . COLONOSCOPY    . CORONARY ANGIOPLASTY  06/2017  . CORONARY ANGIOPLASTY WITH STENT PLACEMENT     "6 stents total" (10/09/2017)  . CORONARY ARTERY BYPASS GRAFT  2001   CABG X4  . CYSTOSCOPY W/ STONE MANIPULATION    . JOINT REPLACEMENT    . LUMBAR LAMINECTOMY Right 10/13/2017   Procedure:  MICRODISCECTOMY LUMBAR LAMINECTOMY RIGHT L3-4;  Surgeon: Kerrin Champagne, MD;  Location: [facility] OR;  Service: Orthopedics;  Laterality: Right;  . LUMBAR LAMINECTOMY/DECOMPRESSION MICRODISCECTOMY N/A 01/16/2014   Procedure: Left L3-4 and L4-5 foraminotomies;  Surgeon: Kerrin Champagne, MD;  Location: [facility] OR;  Service: Orthopedics;  Laterality: N/A;  . NASAL FRACTURE SURGERY    . NASAL SINUS SURGERY     "several times since 1959"  . RADIOLOGY WITH ANESTHESIA N/A 10/12/2017   Procedure: MRI WITH ANESTHESIA;  Surgeon: Radiologist, Medication, MD;  Location: [facility] OR;  Service: Radiology;  Laterality: N/A;  . SHOULDER OPEN ROTATOR CUFF REPAIR Bilateral   . TOTAL  KNEE ARTHROPLASTY Right 2012  . TYMPANOSTOMY TUBE PLACEMENT Bilateral   . ULNAR NERVE TRANSPOSITION Left 05/14/2015   Procedure: LEFT ULNAR NERVE DECOMPRESSION AT THE ELBOW;  Surgeon: Kerrin Champagne, MD;  Location: [facility] OR;  Service: Orthopedics;  Laterality: Left;       Home Medications    Prior to Admission medications   Medication Sig Start Date End Date Taking? Authorizing Provider  amLODipine (NORVASC) 10 MG tablet  10/14/17  Yes [provider]  aspirin EC 81 MG tablet Take 81 mg by mouth daily.   Yes [provider]  clopidogrel (PLAVIX) 75 MG tablet Take 75 mg by mouth daily.   Yes [provider]  Coenzyme Q10 (COQ10 PO) Take 1 tablet by mouth daily.   Yes [provider]  Cranberry 500 MG TABS Take 500 mg by mouth daily.   Yes [provider]  docusate sodium (COLACE) 100 MG capsule Take 200 mg by mouth 2 (two) times daily as needed for mild constipation.   Yes [provider]  Evolocumab (REPATHA) 140 MG/ML SOSY Inject 140 mg into the skin every 14 (fourteen) days.   Yes [provider]  fluticasone (FLONASE) 50 MCG/ACT nasal spray Place 2 sprays into both nostrils as needed for allergies or rhinitis.   Yes [provider]  gabapentin (NEURONTIN) 300 MG capsule Take 1 capsule (300 mg total) by mouth at bedtime for 7 days, THEN 1 capsule (300 mg total) 2 (two) times daily for 28 days. Patient taking differently: 1 capsule (300mg ) twice daily 10/31/17 12/05/17 Yes Kerrin Champagne, MD  isosorbide mononitrate (IMDUR) 30 MG 24 hr tablet Take 30 mg by mouth daily. 09/05/17  Yes [provider]  lisinopril (PRINIVIL,ZESTRIL) 5 MG tablet Take 5 mg by mouth daily.   Yes [provider]  metoprolol tartrate (LOPRESSOR) 50 MG tablet Take 50 mg by mouth 2 (two) times daily. 09/20/17  Yes [provider]  Multiple Vitamin (MULTIVITAMIN WITH MINERALS) TABS tablet Take 1 tablet by mouth daily.   Yes [provider]  oxyCODONE-acetaminophen (PERCOCET/ROXICET) 5-325 MG tablet Take 1 tablet by mouth every 6 (six) hours as needed for severe pain. 10/31/17  Yes Kerrin Champagne, MD  zolpidem (AMBIEN) 10 MG tablet Take 1 tablet (10 mg total) by mouth at bedtime as needed for sleep. Patient taking differently: Take 5 mg by mouth at bedtime as needed for sleep.  07/24/16  Yes Eldred Manges, MD  methylPREDNISolone (MEDROL DOSEPAK) 4 MG TBPK tablet Take as directed. Patient not taking: Reported on 11/11/2017 10/29/17   Kerrin Champagne, MD  oxyCODONE (OXYCONTIN) 10 mg 12 hr tablet Take 1 tablet (10 mg total) by mouth every 12 (twelve) hours. Patient not taking: Reported on 11/11/2017 11/06/17   Kerrin Champagne, MD    Family History  No family history on file.  Social History Social History   Tobacco Use  . Smoking status: Former Smoker    Packs/day: 1.00    Years: 2.00    Pack years: 2.00    Last attempt to quit: 01/09/1957    Years since quitting: 60.8  . Smokeless tobacco: Never Used  Substance Use Topics  . Alcohol use: No  . Drug use: No     Allergies   Ezetimibe; Morphine and related; and Statins   Review of Systems Review of Systems  Constitutional: Negative for chills and fever.  Respiratory: Negative for shortness of breath.   Cardiovascular: Negative for chest pain, palpitations and leg swelling.  Gastrointestinal: Positive for constipation. Negative for abdominal pain, diarrhea, nausea and vomiting.  Genitourinary: Positive for difficulty urinating. Negative for dysuria, flank pain, frequency and hematuria.  Musculoskeletal: Positive for back pain and myalgias.  Skin: Negative for rash and wound.  Neurological: Negative for dizziness, weakness, light-headedness, numbness and headaches.  All other systems reviewed and are negative.    Physical Exam Updated Vital Signs BP 123/63   Pulse 87   Temp 97.9 F (36.6 C) (Oral)   Resp 20   SpO2 98%   Physical Exam    Constitutional: He is oriented to person, place, and time. He appears well-developed and well-nourished. No distress.  HENT:  Head: Normocephalic and atraumatic.  Mouth/Throat: Oropharynx is clear and moist.  Eyes: EOM are normal. Pupils are equal, round, and reactive to light.  Neck: Normal range of motion. Neck supple.  Cardiovascular: Normal rate and regular rhythm.  Pulmonary/Chest: Effort normal and breath sounds normal.  Abdominal: Soft. Bowel sounds are normal. There is no tenderness. There is no rebound and no guarding.  Musculoskeletal: Normal range of motion. He exhibits tenderness. He exhibits no edema.  Lumbar midline surgical scar appears well healing.  No erythema or warmth.  No fluctuant masses.  Patient does have tenderness to palpation over at the site.  2+ distal pulses in bilateral lower extremities.  No swelling noted.  Limited range of motion due to pain.  Neurological: He is alert and oriented to person, place, and time.  Sensation intact.  No saddle anesthesia.  Moving bilateral lower extremities without obvious deficit though range of motion is limited due to pain.  Skin: Skin is warm and dry. Capillary refill takes less than 2 seconds. No rash noted. He is not diaphoretic. No erythema.  Psychiatric: He has a normal mood and affect. His behavior is normal.  Nursing note and vitals reviewed.    ED Treatments / Results  Labs (all labs ordered are listed, but only abnormal results are displayed) Labs Reviewed  URINALYSIS, ROUTINE W REFLEX MICROSCOPIC  CBC WITH DIFFERENTIAL/PLATELET  COMPREHENSIVE METABOLIC PANEL  SEDIMENTATION RATE  C-REACTIVE PROTEIN    EKG  EKG Interpretation None       Radiology Mr Lumbar Spine W Wo Contrast  Result Date: 11/11/2017 CLINICAL DATA:  History of recent back surgery 1 month ago. Worsening pain. EXAM: MRI LUMBAR SPINE WITHOUT AND WITH CONTRAST TECHNIQUE: Multiplanar and multiecho pulse sequences of the lumbar spine were  obtained without and with intravenous contrast. CONTRAST:  19mL MULTIHANCE GADOBENATE DIMEGLUMINE 529 MG/ML IV SOLN COMPARISON:  MRI 10/12/2017 FINDINGS: Segmentation: There are five lumbar type vertebral bodies. The last full intervertebral disc space is labeled L5-S1. This correlates with the prior examination. Alignment:  Normal in stable. Vertebrae: Normal marrow signal except for mild endplate reactive changes and enhancement at L3-4  likely due to the recent surgery. Conus medullaris and cauda equina: Conus extends to the T12-L1 level. Conus and cauda equina appear normal. Paraspinal and other soft tissues: No significant findings. Mild gallbladder distention. Disc levels: L1-2: Diffuse bulging annulus with mild stable bilateral lateral recess encroachment. L2-3: Bulging annulus and facet disease with mild bilateral lateral recess encroachment. L3-4: Right-sided laminectomy defect with moderate enhancement and a fluid collection adjacent to the thecal sac and extending back along the spinous process. This is probably postoperative seroma. Could not totally exclude the possibility of a CSF leak or abscess but recommend correlation with clinical findings. Reimaging at some point may be helpful. There is a new moderate-sized left paracentral and left medial foraminal disc protrusion with mass effect on the thecal sac. This likely affects both the left L3 and L4 nerve roots. L4-5: Left-sided laminectomy defect. No spinal or foraminal stenosis. Moderate facet disease. L5-S1: No significant findings. IMPRESSION: 1. Postoperative changes at L3-4 with a right-sided laminectomy defect. There is a fluid collection in the laminectomy bed with surrounding enhancing inflammatory changes. This may be normal postoperative change 1 month after surgery. Recommend clinical correlation. 2. New left-sided disc protrusion at L3-4 likely effecting both the L3 and L4 nerve roots. 3. Postoperative changes at L4-5 without recurrent  disc protrusion, spinal or foraminal stenosis. 4. Stable mild bilateral lateral recess stenosis at L1-2 and L2-3. Electronically Signed   By: Rudie Meyer M.D.   On: 11/11/2017 15:06    Procedures Procedures (including critical care time)  Medications Ordered in ED Medications  HYDROmorphone (DILAUDID) injection 0.5 mg (not administered)  HYDROmorphone (DILAUDID) injection 0.5 mg (0.5 mg Intravenous Given 11/11/17 1137)  sodium chloride 0.9 % bolus 500 mL (0 mLs Intravenous Stopped 11/11/17 1218)  LORazepam (ATIVAN) injection 1 mg (1 mg Intravenous Given 11/11/17 1214)  HYDROmorphone (DILAUDID) injection 0.5 mg (0.5 mg Intravenous Given 11/11/17 1214)  HYDROmorphone (DILAUDID) injection 0.5 mg (0.5 mg Intravenous Given 11/11/17 1358)  LORazepam (ATIVAN) injection 1 mg (1 mg Intravenous Given 11/11/17 1358)  gadobenate dimeglumine (MULTIHANCE) injection 20 mL (19 mLs Intravenous Contrast Given 11/11/17 1436)     Initial Impression / Assessment and Plan / ED Course  I have reviewed the triage vital signs and the nursing notes.  Pertinent labs & imaging results that were available during my care of the patient were reviewed by me and considered in my medical decision making (see chart for details).     Patient is requiring multiple doses of Dilaudid and Ativan to control pain.  MRI with new L3/4 disc bulge on the left.  Fluid collection on the right.  Cannot rule out abscess.  Discussed with Dr. Ophelia Charter.  Recommends sed rate and CRP and will admit to observation.  Temporary orders placed.  Final Clinical Impressions(s) / ED Diagnoses   Final diagnoses:  Post-operative pain  Lumbar radiculopathy    ED Discharge Orders    None       Loren Racer, MD 11/11/17 1549

## 2017-11-11 NOTE — H&P (Signed)
Jeffrey Nelson is an 83 y.o. male.   Chief Complaint: Back and left leg pain with left L3-4 HNP HPI: 83 year old male 4 weeks post microdiscectomy right L3-4 by Dr. Otelia Sergeant on 10/13/2017 did well after the surgery with good relief of right leg pain and weakness for 1 week and then developed back pain and left leg pain.  He had an MRI that was scheduled for 11/17/2017 but is been unable to tolerate the pain not able to ambulate with back pain and left leg pain and weakness.  He is used pain medication.  He was seen in the office by Dr. Otelia Sergeant recently on 11/06/2017 5 days ago with back and left leg pain.  Lab work at that time showed improvement in sed rate and C-reactive protein from 10/31/2017 with sed rate decreasing from 79 to 51 and a falling white count that it decreased to 12,400.  He had a prednisone Dosepak which may have caused elevation of his white count on 10/31/2017 when it was 14.9K patient denies fever or chills.  No associated bowel or bladder symptoms.  He does have some problems with constipation with narcotic medication.  Complex history of previous back surgeries.  Cervical fusion.  Spondylolisthesis L4-5.  History of CVA, type 2 diabetes, cubital tunnel on the left.  Past Medical History:  Diagnosis Date  . Anginal pain (HCC)    occ  . Arthritis    "joints" (10/09/2017)  . Chronic lower back pain   . Complication of anesthesia    extremely claustrophobic; "have to be put down for MRI; don't sit in back seat of car;, etc."  . Coronary artery disease   . High cholesterol    "get shots twice/month" (10/09/2017)  . History of blood transfusion 2001   "related to CABG"  . History of kidney stones   . HOH (hard of hearing)    wearing hearing aids  . Hypertension   . Myocardial infarction (HCC) 2000s-06/2017 X 4  . Stroke [facility]) ~ 2015   "in my right eye; sight is coming back little by little" (10/09/2017)  . Type II diabetes mellitus (HCC)     Past Surgical History:  Procedure  Laterality Date  . ANTERIOR CERVICAL DECOMP/DISCECTOMY FUSION N/A 05/14/2015   Procedure: C3-4  ANTERIOR CERVICAL DISCECTOMY AND FUSION WITH PLATE AND SCREWS, LOCAL AND ALLOGRAFT BONE GRAFT;  Surgeon: Kerrin Champagne, MD;  Location: [facility] OR;  Service: Orthopedics;  Laterality: N/A;  . BACK SURGERY    . CARDIAC CATHETERIZATION     X 17 (10/09/2017)  . CATARACT EXTRACTION W/ INTRAOCULAR LENS  IMPLANT, BILATERAL Bilateral   . COLONOSCOPY    . CORONARY ANGIOPLASTY  06/2017  . CORONARY ANGIOPLASTY WITH STENT PLACEMENT     "6 stents total" (10/09/2017)  . CORONARY ARTERY BYPASS GRAFT  2001   CABG X4  . CYSTOSCOPY W/ STONE MANIPULATION    . JOINT REPLACEMENT    . LUMBAR LAMINECTOMY Right 10/13/2017   Procedure: MICRODISCECTOMY LUMBAR LAMINECTOMY RIGHT L3-4;  Surgeon: Kerrin Champagne, MD;  Location: [facility] OR;  Service: Orthopedics;  Laterality: Right;  . LUMBAR LAMINECTOMY/DECOMPRESSION MICRODISCECTOMY N/A 01/16/2014   Procedure: Left L3-4 and L4-5 foraminotomies;  Surgeon: Kerrin Champagne, MD;  Location: [facility] OR;  Service: Orthopedics;  Laterality: N/A;  . NASAL FRACTURE SURGERY    . NASAL SINUS SURGERY     "several times since 1959"  . RADIOLOGY WITH ANESTHESIA N/A 10/12/2017   Procedure: MRI WITH ANESTHESIA;  Surgeon: Radiologist, Medication,  MD;  Location: [facility] OR;  Service: Radiology;  Laterality: N/A;  . SHOULDER OPEN ROTATOR CUFF REPAIR Bilateral   . TOTAL KNEE ARTHROPLASTY Right 2012  . TYMPANOSTOMY TUBE PLACEMENT Bilateral   . ULNAR NERVE TRANSPOSITION Left 05/14/2015   Procedure: LEFT ULNAR NERVE DECOMPRESSION AT THE ELBOW;  Surgeon: Kerrin Champagne, MD;  Location: [facility] OR;  Service: Orthopedics;  Laterality: Left;    No family history on file. Social History:  reports that he quit smoking about 60 years ago. He has a 2.00 pack-year smoking history. he has never used smokeless tobacco. He reports that he does not drink alcohol or use drugs.  Allergies:  Allergies  Allergen Reactions  . Ezetimibe Other  (See Comments)    Cramps  . Morphine And Related Other (See Comments)    Confusion  . Statins Other (See Comments)    Cramps    Medications Prior to Admission  Medication Sig Dispense Refill  . amLODipine (NORVASC) 10 MG tablet     . aspirin EC 81 MG tablet Take 81 mg by mouth daily.    . clopidogrel (PLAVIX) 75 MG tablet Take 75 mg by mouth daily.    . Coenzyme Q10 (COQ10 PO) Take 1 tablet by mouth daily.    . Cranberry 500 MG TABS Take 500 mg by mouth daily.    Marland Kitchen docusate sodium (COLACE) 100 MG capsule Take 200 mg by mouth 2 (two) times daily as needed for mild constipation.    . Evolocumab (REPATHA) 140 MG/ML SOSY Inject 140 mg into the skin every 14 (fourteen) days.    . fluticasone (FLONASE) 50 MCG/ACT nasal spray Place 2 sprays into both nostrils as needed for allergies or rhinitis.    Marland Kitchen gabapentin (NEURONTIN) 300 MG capsule Take 1 capsule (300 mg total) by mouth at bedtime for 7 days, THEN 1 capsule (300 mg total) 2 (two) times daily for 28 days. (Patient taking differently: 1 capsule (300mg ) twice daily) 63 capsule 0  . isosorbide mononitrate (IMDUR) 30 MG 24 hr tablet Take 30 mg by mouth daily.    Marland Kitchen lisinopril (PRINIVIL,ZESTRIL) 5 MG tablet Take 5 mg by mouth daily.    . metoprolol tartrate (LOPRESSOR) 50 MG tablet Take 50 mg by mouth 2 (two) times daily.    . Multiple Vitamin (MULTIVITAMIN WITH MINERALS) TABS tablet Take 1 tablet by mouth daily.    Marland Kitchen oxyCODONE-acetaminophen (PERCOCET/ROXICET) 5-325 MG tablet Take 1 tablet by mouth every 6 (six) hours as needed for severe pain. 30 tablet 0  . zolpidem (AMBIEN) 10 MG tablet Take 1 tablet (10 mg total) by mouth at bedtime as needed for sleep. (Patient taking differently: Take 5 mg by mouth at bedtime as needed for sleep. ) 10 tablet 0  . methylPREDNISolone (MEDROL DOSEPAK) 4 MG TBPK tablet Take as directed. (Patient not taking: Reported on 11/11/2017) 21 tablet 0  . oxyCODONE (OXYCONTIN) 10 mg 12 hr tablet Take 1 tablet (10 mg total)  by mouth every 12 (twelve) hours. (Patient not taking: Reported on 11/11/2017) 14 tablet 0    Results for orders placed or performed during the hospital encounter of 11/11/17 (from the past 48 hour(s))  Urinalysis, Routine w reflex microscopic     Status: None   Collection Time: 11/11/17  4:45 PM  Result Value Ref Range   Color, Urine YELLOW YELLOW   APPearance CLEAR CLEAR   Specific Gravity, Urine 1.013 1.005 - 1.030   pH 5.0 5.0 - 8.0   Glucose, UA  NEGATIVE NEGATIVE mg/dL   Hgb urine dipstick NEGATIVE NEGATIVE   Bilirubin Urine NEGATIVE NEGATIVE   Ketones, ur NEGATIVE NEGATIVE mg/dL   Protein, ur NEGATIVE NEGATIVE mg/dL   Nitrite NEGATIVE NEGATIVE   Leukocytes, UA NEGATIVE NEGATIVE    Comment: Performed at [facility], [address] [place], [place] [address]   Mr Lumbar Spine W Wo Contrast  Result Date: 11/11/2017 CLINICAL DATA:  History of recent back surgery 1 month ago. Worsening pain. EXAM: MRI LUMBAR SPINE WITHOUT AND WITH CONTRAST TECHNIQUE: Multiplanar and multiecho pulse sequences of the lumbar spine were obtained without and with intravenous contrast. CONTRAST:  19mL MULTIHANCE GADOBENATE DIMEGLUMINE 529 MG/ML IV SOLN COMPARISON:  MRI 10/12/2017 FINDINGS: Segmentation: There are five lumbar type vertebral bodies. The last full intervertebral disc space is labeled L5-S1. This correlates with the prior examination. Alignment:  Normal in stable. Vertebrae: Normal marrow signal except for mild endplate reactive changes and enhancement at L3-4 likely due to the recent surgery. Conus medullaris and cauda equina: Conus extends to the T12-L1 level. Conus and cauda equina appear normal. Paraspinal and other soft tissues: No significant findings. Mild gallbladder distention. Disc levels: L1-2: Diffuse bulging annulus with mild stable bilateral lateral recess encroachment. L2-3: Bulging annulus and facet disease with mild bilateral lateral recess encroachment. L3-4: Right-sided  laminectomy defect with moderate enhancement and a fluid collection adjacent to the thecal sac and extending back along the spinous process. This is probably postoperative seroma. Could not totally exclude the possibility of a CSF leak or abscess but recommend correlation with clinical findings. Reimaging at some point may be helpful. There is a new moderate-sized left paracentral and left medial foraminal disc protrusion with mass effect on the thecal sac. This likely affects both the left L3 and L4 nerve roots. L4-5: Left-sided laminectomy defect. No spinal or foraminal stenosis. Moderate facet disease. L5-S1: No significant findings. IMPRESSION: 1. Postoperative changes at L3-4 with a right-sided laminectomy defect. There is a fluid collection in the laminectomy bed with surrounding enhancing inflammatory changes. This may be normal postoperative change 1 month after surgery. Recommend clinical correlation. 2. New left-sided disc protrusion at L3-4 likely effecting both the L3 and L4 nerve roots. 3. Postoperative changes at L4-5 without recurrent disc protrusion, spinal or foraminal stenosis. 4. Stable mild bilateral lateral recess stenosis at L1-2 and L2-3. Electronically Signed   By: Rudie MeyerP.  Gallerani M.D.   On: 11/11/2017 15:06    ROS review of systems is updated unchanged from his recent surgery 4 weeks ago other than as mentioned in HPI.  New back and left leg pain that started 1 week after his right L3-4 microdiscectomy by Dr. Otelia SergeantNitka.  Blood pressure (!) 147/75, pulse 83, temperature 97.7 F (36.5 C), temperature source Oral, resp. rate 20, SpO2 96 %. Physical Exam  Constitutional: He is oriented to person, place, and time. He appears well-developed and well-nourished.  HENT:  Head: Normocephalic.  Patient wears glasses.  Neck: No tracheal deviation present. No thyromegaly present.  Healed cervical incision.  No brachial plexus tenderness.  No lymphadenopathy.  Cardiovascular: Normal rate.   Respiratory: Effort normal. He has no wheezes.  GI: Soft.  Neurological: He is alert and oriented to person, place, and time.  Skin: Skin is warm and dry.  Psychiatric: He has a normal mood and affect. His behavior is normal. Judgment and thought content normal.     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  4 weeks post right L3-4 microdiscectomy with good relief  of back and right leg pain.  By history it sounds like the disc herniation occurred about 1 week after his procedure.  Pain is been incapacitating he is not been able to ambulate.  No MRI obtained today demonstrates H&P on the left at L3-4.  Discussed case with Dr. Floria Raveling who reviewed MRI scan.  We will post patient for left L3-4 microdiscectomy for recurrent HNP.  Procedure discussed with patient and wife is at the bedside.  They are in agreement.  Eldred Manges, MD 11/11/2017, 5:26 PM

## 2017-11-11 NOTE — ED Triage Notes (Signed)
Pt reports having back sx at L3 for ruptured disc x 1 month ago. Pt reports Dr. Carilyn GoodpastureYeatts instructed to come to ED today for admission for complications. PT reports taking percocet q 4 hours at home with last dose last night

## 2017-11-11 NOTE — Progress Notes (Signed)
Patient arrived to 6N20.  Message left with Dr. Ophelia CharterYates

## 2017-11-11 NOTE — ED Notes (Signed)
ED Provider at bedside. 

## 2017-11-11 NOTE — ED Notes (Signed)
Patient had surgery a month ago. He reports that he felt better but then he started feeling more pain than runs down the left leg.

## 2017-11-12 ENCOUNTER — Encounter (HOSPITAL_COMMUNITY)
Admission: EM | Disposition: A | Discharge status: Home Health | Payer: Self-pay | Source: Home / Self Care | Attending: Specialist

## 2017-11-12 ENCOUNTER — Inpatient Hospital Stay (HOSPITAL_COMMUNITY): Payer: MEDICARE

## 2017-11-12 ENCOUNTER — Inpatient Hospital Stay (HOSPITAL_COMMUNITY): Payer: MEDICARE | Admitting: Certified Registered"

## 2017-11-12 ENCOUNTER — Encounter (HOSPITAL_COMMUNITY): Payer: Self-pay | Admitting: Certified Registered"

## 2017-11-12 DIAGNOSIS — M5116 Intervertebral disc disorders with radiculopathy, lumbar region: Secondary | ICD-10-CM | POA: Diagnosis not present

## 2017-11-12 DIAGNOSIS — Z9889 Other specified postprocedural states: Secondary | ICD-10-CM

## 2017-11-12 DIAGNOSIS — I252 Old myocardial infarction: Secondary | ICD-10-CM | POA: Diagnosis not present

## 2017-11-12 DIAGNOSIS — I2510 Atherosclerotic heart disease of native coronary artery without angina pectoris: Secondary | ICD-10-CM | POA: Diagnosis not present

## 2017-11-12 DIAGNOSIS — M5416 Radiculopathy, lumbar region: Secondary | ICD-10-CM | POA: Diagnosis not present

## 2017-11-12 DIAGNOSIS — Z8673 Personal history of transient ischemic attack (TIA), and cerebral infarction without residual deficits: Secondary | ICD-10-CM | POA: Diagnosis not present

## 2017-11-12 HISTORY — PX: LUMBAR LAMINECTOMY: SHX95

## 2017-11-12 LAB — GLUCOSE, CAPILLARY
Glucose-Capillary: 125 mg/dL — ABNORMAL HIGH (ref 65–99)
Glucose-Capillary: 122 mg/dL — ABNORMAL HIGH (ref 65–99)

## 2017-11-12 SURGERY — MICRODISCECTOMY LUMBAR LAMINECTOMY
Anesthesia: General | Laterality: Left

## 2017-11-12 MED ORDER — ONDANSETRON HCL 4 MG/2ML IJ SOLN: 4.0000 mg | Freq: Four times a day (QID) | INTRAMUSCULAR | Status: DC | PRN

## 2017-11-12 MED ORDER — PHENYLEPHRINE HCL 10 MG/ML IJ SOLN
INTRAVENOUS | Status: DC | PRN
  Administered 2017-11-12: 10 ug/min via INTRAVENOUS

## 2017-11-12 MED ORDER — EVOLOCUMAB 140 MG/ML ~~LOC~~ SOSY: 140.0000 mg | PREFILLED_SYRINGE | SUBCUTANEOUS | Status: DC

## 2017-11-12 MED ORDER — POLYETHYLENE GLYCOL 3350 17 G PO PACK: 17.0000 g | PACK | Freq: Every day | ORAL | Status: DC | PRN

## 2017-11-12 MED ORDER — ACETAMINOPHEN 325 MG PO TABS
650.0000 mg | ORAL_TABLET | ORAL | Status: DC | PRN
  Administered 2017-11-13: 650 mg via ORAL
  Filled 2017-11-12: qty 2

## 2017-11-12 MED ORDER — SODIUM CHLORIDE 0.9 % IV SOLN
INTRAVENOUS | Status: DC
  Administered 2017-11-12 – 2017-11-13 (×3): via INTRAVENOUS

## 2017-11-12 MED ORDER — LACTATED RINGERS IV SOLN
INTRAVENOUS | Status: DC
  Administered 2017-11-12: 17:00:00 via INTRAVENOUS

## 2017-11-12 MED ORDER — OXYCODONE HCL 5 MG PO TABS
5.0000 mg | ORAL_TABLET | ORAL | Status: DC | PRN
  Administered 2017-11-14: 5 mg via ORAL
  Filled 2017-11-12: qty 1

## 2017-11-12 MED ORDER — DEXAMETHASONE SODIUM PHOSPHATE 10 MG/ML IJ SOLN
INTRAMUSCULAR | Status: AC
  Filled 2017-11-12: qty 1

## 2017-11-12 MED ORDER — METOPROLOL TARTRATE 50 MG PO TABS
50.0000 mg | ORAL_TABLET | Freq: Two times a day (BID) | ORAL | Status: DC
  Administered 2017-11-12 – 2017-11-14 (×4): 50 mg via ORAL
  Filled 2017-11-12 (×4): qty 1

## 2017-11-12 MED ORDER — FLEET ENEMA 7-19 GM/118ML RE ENEM: 1.0000 | ENEMA | Freq: Once | RECTAL | Status: DC | PRN

## 2017-11-12 MED ORDER — DOCUSATE SODIUM 100 MG PO CAPS
100.0000 mg | ORAL_CAPSULE | Freq: Two times a day (BID) | ORAL | Status: DC
  Administered 2017-11-12 – 2017-11-14 (×4): 100 mg via ORAL
  Filled 2017-11-12 (×4): qty 1

## 2017-11-12 MED ORDER — FLUTICASONE PROPIONATE 50 MCG/ACT NA SUSP
2.0000 | NASAL | Status: DC | PRN
  Filled 2017-11-12: qty 16

## 2017-11-12 MED ORDER — ASPIRIN EC 81 MG PO TBEC
81.0000 mg | DELAYED_RELEASE_TABLET | Freq: Every day | ORAL | Status: DC
  Administered 2017-11-13 – 2017-11-14 (×2): 81 mg via ORAL
  Filled 2017-11-12 (×2): qty 1

## 2017-11-12 MED ORDER — ACETAMINOPHEN 650 MG RE SUPP: 650.0000 mg | RECTAL | Status: DC | PRN

## 2017-11-12 MED ORDER — FENTANYL CITRATE (PF) 100 MCG/2ML IJ SOLN: 25.0000 ug | INTRAMUSCULAR | Status: DC | PRN

## 2017-11-12 MED ORDER — BISACODYL 5 MG PO TBEC: 5.0000 mg | DELAYED_RELEASE_TABLET | Freq: Every day | ORAL | Status: DC | PRN

## 2017-11-12 MED ORDER — GLYCOPYRROLATE 0.2 MG/ML IJ SOLN
INTRAMUSCULAR | Status: DC | PRN
  Administered 2017-11-12: 0.1 mg via INTRAVENOUS

## 2017-11-12 MED ORDER — CEFAZOLIN SODIUM-DEXTROSE 2-4 GM/100ML-% IV SOLN
2.0000 g | Freq: Once | INTRAVENOUS | Status: AC
  Administered 2017-11-12: 2 g via INTRAVENOUS

## 2017-11-12 MED ORDER — ONDANSETRON HCL 4 MG/2ML IJ SOLN
INTRAMUSCULAR | Status: AC
  Filled 2017-11-12: qty 2

## 2017-11-12 MED ORDER — ADULT MULTIVITAMIN W/MINERALS CH
1.0000 | ORAL_TABLET | Freq: Every day | ORAL | Status: DC
  Administered 2017-11-13 – 2017-11-14 (×2): 1 via ORAL
  Filled 2017-11-12 (×2): qty 1

## 2017-11-12 MED ORDER — LISINOPRIL 5 MG PO TABS
5.0000 mg | ORAL_TABLET | Freq: Every day | ORAL | Status: DC
  Administered 2017-11-13 – 2017-11-14 (×2): 5 mg via ORAL
  Filled 2017-11-12 (×2): qty 1

## 2017-11-12 MED ORDER — PROPOFOL 10 MG/ML IV BOLUS
INTRAVENOUS | Status: AC
  Filled 2017-11-12: qty 20

## 2017-11-12 MED ORDER — OXYCODONE HCL ER 10 MG PO T12A
10.0000 mg | EXTENDED_RELEASE_TABLET | Freq: Two times a day (BID) | ORAL | Status: DC
  Administered 2017-11-12 – 2017-11-14 (×4): 10 mg via ORAL
  Filled 2017-11-12 (×4): qty 1

## 2017-11-12 MED ORDER — PROPOFOL 10 MG/ML IV BOLUS
INTRAVENOUS | Status: DC | PRN
  Administered 2017-11-12: 130 mg via INTRAVENOUS

## 2017-11-12 MED ORDER — PANTOPRAZOLE SODIUM 40 MG IV SOLR
40.0000 mg | Freq: Every day | INTRAVENOUS | Status: DC
  Administered 2017-11-12: 40 mg via INTRAVENOUS
  Filled 2017-11-12: qty 40

## 2017-11-12 MED ORDER — OXYCODONE HCL 5 MG PO TABS
10.0000 mg | ORAL_TABLET | ORAL | Status: DC | PRN
  Administered 2017-11-13 – 2017-11-14 (×5): 10 mg via ORAL
  Filled 2017-11-12 (×5): qty 2

## 2017-11-12 MED ORDER — SUGAMMADEX SODIUM 200 MG/2ML IV SOLN
INTRAVENOUS | Status: DC | PRN
  Administered 2017-11-12: 200 mg via INTRAVENOUS

## 2017-11-12 MED ORDER — ONDANSETRON HCL 4 MG PO TABS: 4.0000 mg | ORAL_TABLET | Freq: Four times a day (QID) | ORAL | Status: DC | PRN

## 2017-11-12 MED ORDER — BUPIVACAINE LIPOSOME 1.3 % IJ SUSP
20.0000 mL | Freq: Once | INTRAMUSCULAR | Status: DC
  Filled 2017-11-12: qty 20

## 2017-11-12 MED ORDER — ZOLPIDEM TARTRATE 5 MG PO TABS
5.0000 mg | ORAL_TABLET | Freq: Every evening | ORAL | Status: DC | PRN
  Administered 2017-11-12 – 2017-11-13 (×2): 5 mg via ORAL
  Filled 2017-11-12 (×2): qty 1

## 2017-11-12 MED ORDER — FENTANYL CITRATE (PF) 100 MCG/2ML IJ SOLN
25.0000 ug | INTRAMUSCULAR | Status: DC | PRN
  Administered 2017-11-12 (×2): 50 ug via INTRAVENOUS

## 2017-11-12 MED ORDER — HYDROMORPHONE HCL 1 MG/ML IJ SOLN
0.5000 mg | INTRAMUSCULAR | Status: DC | PRN
  Administered 2017-11-13 (×2): 0.5 mg via INTRAVENOUS
  Filled 2017-11-12 (×2): qty 1

## 2017-11-12 MED ORDER — METOCLOPRAMIDE HCL 5 MG/ML IJ SOLN: 10.0000 mg | Freq: Once | INTRAMUSCULAR | Status: DC | PRN

## 2017-11-12 MED ORDER — THROMBIN (RECOMBINANT) 20000 UNITS EX SOLR
CUTANEOUS | Status: AC
  Filled 2017-11-12: qty 20000

## 2017-11-12 MED ORDER — HYDROMORPHONE HCL 1 MG/ML IJ SOLN: 1.0000 mg | INTRAMUSCULAR | Status: DC | PRN

## 2017-11-12 MED ORDER — CRANBERRY 500 MG PO TABS: 500.0000 mg | ORAL_TABLET | Freq: Every day | ORAL | Status: DC

## 2017-11-12 MED ORDER — ONDANSETRON HCL 4 MG/2ML IJ SOLN
INTRAMUSCULAR | Status: DC | PRN
  Administered 2017-11-12: 4 mg via INTRAVENOUS

## 2017-11-12 MED ORDER — GELATIN ABSORBABLE 100 EX MISC
CUTANEOUS | Status: DC | PRN
  Administered 2017-11-12: 20 mL via TOPICAL

## 2017-11-12 MED ORDER — CEFAZOLIN SODIUM-DEXTROSE 2-4 GM/100ML-% IV SOLN
2.0000 g | Freq: Three times a day (TID) | INTRAVENOUS | Status: AC
  Administered 2017-11-13 (×2): 2 g via INTRAVENOUS
  Filled 2017-11-12 (×5): qty 100

## 2017-11-12 MED ORDER — ROCURONIUM BROMIDE 100 MG/10ML IV SOLN
INTRAVENOUS | Status: DC | PRN
  Administered 2017-11-12: 50 mg via INTRAVENOUS
  Administered 2017-11-12: 10 mg via INTRAVENOUS

## 2017-11-12 MED ORDER — 0.9 % SODIUM CHLORIDE (POUR BTL) OPTIME
TOPICAL | Status: DC | PRN
  Administered 2017-11-12: 1000 mL

## 2017-11-12 MED ORDER — POLYVINYL ALCOHOL 1.4 % OP SOLN
1.0000 [drp] | OPHTHALMIC | Status: DC | PRN
  Administered 2017-11-12 – 2017-11-13 (×2): 1 [drp] via OPHTHALMIC
  Filled 2017-11-12: qty 15

## 2017-11-12 MED ORDER — CEFAZOLIN SODIUM-DEXTROSE 2-4 GM/100ML-% IV SOLN
INTRAVENOUS | Status: AC
  Filled 2017-11-12: qty 100

## 2017-11-12 MED ORDER — GABAPENTIN 300 MG PO CAPS
300.0000 mg | ORAL_CAPSULE | Freq: Two times a day (BID) | ORAL | Status: DC
  Administered 2017-11-12 – 2017-11-14 (×4): 300 mg via ORAL
  Filled 2017-11-12 (×4): qty 1

## 2017-11-12 MED ORDER — PHENYLEPHRINE 40 MCG/ML (10ML) SYRINGE FOR IV PUSH (FOR BLOOD PRESSURE SUPPORT)
PREFILLED_SYRINGE | INTRAVENOUS | Status: AC
  Filled 2017-11-12: qty 10

## 2017-11-12 MED ORDER — ARTIFICIAL TEARS OPHTHALMIC OINT
TOPICAL_OINTMENT | OPHTHALMIC | Status: DC | PRN
  Administered 2017-11-12: 1 via OPHTHALMIC

## 2017-11-12 MED ORDER — FENTANYL CITRATE (PF) 250 MCG/5ML IJ SOLN
INTRAMUSCULAR | Status: AC
  Filled 2017-11-12: qty 5

## 2017-11-12 MED ORDER — SODIUM CHLORIDE 0.9% FLUSH
3.0000 mL | Freq: Two times a day (BID) | INTRAVENOUS | Status: DC
  Administered 2017-11-13: 3 mL via INTRAVENOUS

## 2017-11-12 MED ORDER — MEPERIDINE HCL 50 MG/ML IJ SOLN: 6.2500 mg | INTRAMUSCULAR | Status: DC | PRN

## 2017-11-12 MED ORDER — ARTIFICIAL TEARS OPHTHALMIC OINT
TOPICAL_OINTMENT | OPHTHALMIC | Status: AC
  Filled 2017-11-12: qty 3.5

## 2017-11-12 MED ORDER — DOCUSATE SODIUM 100 MG PO CAPS: 200.0000 mg | ORAL_CAPSULE | Freq: Two times a day (BID) | ORAL | Status: DC | PRN

## 2017-11-12 MED ORDER — LIDOCAINE 2% (20 MG/ML) 5 ML SYRINGE
INTRAMUSCULAR | Status: DC | PRN
  Administered 2017-11-12: 20 mg via INTRAVENOUS

## 2017-11-12 MED ORDER — DEXAMETHASONE SODIUM PHOSPHATE 4 MG/ML IJ SOLN
INTRAMUSCULAR | Status: DC | PRN
  Administered 2017-11-12: 8 mg via INTRAVENOUS

## 2017-11-12 MED ORDER — ALUM & MAG HYDROXIDE-SIMETH 200-200-20 MG/5ML PO SUSP
30.0000 mL | Freq: Four times a day (QID) | ORAL | Status: DC | PRN
Start: 2017-11-12 — End: 2017-11-14

## 2017-11-12 MED ORDER — LIDOCAINE HCL (CARDIAC) 20 MG/ML IV SOLN
INTRAVENOUS | Status: AC
  Filled 2017-11-12: qty 5

## 2017-11-12 MED ORDER — CLOPIDOGREL BISULFATE 75 MG PO TABS
75.0000 mg | ORAL_TABLET | Freq: Every day | ORAL | Status: DC
  Administered 2017-11-13 – 2017-11-14 (×2): 75 mg via ORAL
  Filled 2017-11-12 (×2): qty 1

## 2017-11-12 MED ORDER — ISOSORBIDE MONONITRATE ER 30 MG PO TB24
30.0000 mg | ORAL_TABLET | Freq: Every day | ORAL | Status: DC
  Administered 2017-11-13 – 2017-11-14 (×2): 30 mg via ORAL
  Filled 2017-11-12 (×2): qty 1

## 2017-11-12 MED ORDER — ROCURONIUM BROMIDE 10 MG/ML (PF) SYRINGE
PREFILLED_SYRINGE | INTRAVENOUS | Status: AC
  Filled 2017-11-12: qty 5

## 2017-11-12 MED ORDER — PHENYLEPHRINE 40 MCG/ML (10ML) SYRINGE FOR IV PUSH (FOR BLOOD PRESSURE SUPPORT)
PREFILLED_SYRINGE | INTRAVENOUS | Status: DC | PRN
  Administered 2017-11-12: 80 ug via INTRAVENOUS
  Administered 2017-11-12: 40 ug via INTRAVENOUS

## 2017-11-12 MED ORDER — SODIUM CHLORIDE 0.9 % IV SOLN: 250.0000 mL | INTRAVENOUS | Status: DC

## 2017-11-12 MED ORDER — COQ10 100 MG PO CAPS: ORAL_CAPSULE | Freq: Every day | ORAL | Status: DC

## 2017-11-12 MED ORDER — PHENOL 1.4 % MT LIQD: 1.0000 | OROMUCOSAL | Status: DC | PRN

## 2017-11-12 MED ORDER — EPHEDRINE 5 MG/ML INJ
INTRAVENOUS | Status: AC
  Filled 2017-11-12: qty 10

## 2017-11-12 MED ORDER — LACTATED RINGERS IV SOLN
INTRAVENOUS | Status: DC | PRN
  Administered 2017-11-12 (×2): via INTRAVENOUS

## 2017-11-12 MED ORDER — SODIUM CHLORIDE 0.9% FLUSH: 3.0000 mL | INTRAVENOUS | Status: DC | PRN

## 2017-11-12 MED ORDER — SUGAMMADEX SODIUM 200 MG/2ML IV SOLN
INTRAVENOUS | Status: AC
  Filled 2017-11-12: qty 2

## 2017-11-12 MED ORDER — FENTANYL CITRATE (PF) 100 MCG/2ML IJ SOLN
INTRAMUSCULAR | Status: AC
  Administered 2017-11-12: 50 ug via INTRAVENOUS
  Filled 2017-11-12: qty 2

## 2017-11-12 MED ORDER — BUPIVACAINE HCL (PF) 0.5 % IJ SOLN
INTRAMUSCULAR | Status: AC
  Filled 2017-11-12: qty 30

## 2017-11-12 MED ORDER — FENTANYL CITRATE (PF) 100 MCG/2ML IJ SOLN
INTRAMUSCULAR | Status: DC | PRN
  Administered 2017-11-12 (×2): 50 ug via INTRAVENOUS

## 2017-11-12 MED ORDER — MENTHOL 3 MG MT LOZG: 1.0000 | LOZENGE | OROMUCOSAL | Status: DC | PRN

## 2017-11-12 SURGICAL SUPPLY — 55 items
ADH SKN CLS APL DERMABOND .7 (GAUZE/BANDAGES/DRESSINGS) ×1
APL SKNCLS STERI-STRIP NONHPOA (GAUZE/BANDAGES/DRESSINGS) ×1
BENZOIN TINCTURE PRP APPL 2/3 (GAUZE/BANDAGES/DRESSINGS) ×2 IMPLANT
BUR SABER RD CUTTING 3.0 (BURR) ×2 IMPLANT
CANISTER SUCT 3000ML PPV (MISCELLANEOUS) ×2 IMPLANT
COVER MAYO STAND STRL (DRAPES) ×2 IMPLANT
COVER SURGICAL LIGHT HANDLE (MISCELLANEOUS) ×2 IMPLANT
DERMABOND ADVANCED (GAUZE/BANDAGES/DRESSINGS) ×1
DERMABOND ADVANCED .7 DNX12 (GAUZE/BANDAGES/DRESSINGS) ×1 IMPLANT
DRAPE C-ARM 42X72 X-RAY (DRAPES) IMPLANT
DRAPE HALF SHEET 40X57 (DRAPES) IMPLANT
DRAPE MICROSCOPE LEICA (MISCELLANEOUS) ×2 IMPLANT
DRAPE SURG 17X23 STRL (DRAPES) ×8 IMPLANT
DRAPE UNIVERSAL PACK (DRAPES) ×2 IMPLANT
DRSG MEPILEX BORDER 4X4 (GAUZE/BANDAGES/DRESSINGS) ×2 IMPLANT
DRSG MEPILEX BORDER 4X8 (GAUZE/BANDAGES/DRESSINGS) IMPLANT
DURAPREP 26ML APPLICATOR (WOUND CARE) ×2 IMPLANT
ELECT BLADE 4.0 EZ CLEAN MEGAD (MISCELLANEOUS) ×2
ELECT CAUTERY BLADE 6.4 (BLADE) ×2 IMPLANT
ELECT REM PT RETURN 9FT ADLT (ELECTROSURGICAL) ×2
ELECTRODE BLDE 4.0 EZ CLN MEGD (MISCELLANEOUS) ×1 IMPLANT
ELECTRODE REM PT RTRN 9FT ADLT (ELECTROSURGICAL) ×1 IMPLANT
GLOVE BIOGEL PI IND STRL 6.5 (GLOVE) ×1 IMPLANT
GLOVE BIOGEL PI IND STRL 8 (GLOVE) ×1 IMPLANT
GLOVE BIOGEL PI INDICATOR 6.5 (GLOVE) ×1
GLOVE BIOGEL PI INDICATOR 8 (GLOVE) ×1
GLOVE ECLIPSE 8.5 STRL (GLOVE) ×2 IMPLANT
GLOVE ORTHO TXT STRL SZ7.5 (GLOVE) ×2 IMPLANT
GLOVE SURG 8.5 LATEX PF (GLOVE) ×2 IMPLANT
GLOVE SURG SS PI 6.5 STRL IVOR (GLOVE) ×2 IMPLANT
GOWN STRL REUS W/ TWL LRG LVL3 (GOWN DISPOSABLE) ×1 IMPLANT
GOWN STRL REUS W/TWL 2XL LVL3 (GOWN DISPOSABLE) ×4 IMPLANT
GOWN STRL REUS W/TWL LRG LVL3 (GOWN DISPOSABLE) ×2
KIT BASIN OR (CUSTOM PROCEDURE TRAY) ×2 IMPLANT
KIT ROOM TURNOVER OR (KITS) ×2 IMPLANT
NEEDLE SPNL 18GX3.5 QUINCKE PK (NEEDLE) ×4 IMPLANT
NS IRRIG 1000ML POUR BTL (IV SOLUTION) ×2 IMPLANT
PACK LAMINECTOMY ORTHO (CUSTOM PROCEDURE TRAY) ×2 IMPLANT
PAD ARMBOARD 7.5X6 YLW CONV (MISCELLANEOUS) ×4 IMPLANT
PATTIES SURGICAL .5 X.5 (GAUZE/BANDAGES/DRESSINGS) ×2 IMPLANT
PATTIES SURGICAL .75X.75 (GAUZE/BANDAGES/DRESSINGS) ×2 IMPLANT
SPONGE LAP 4X18 X RAY DECT (DISPOSABLE) IMPLANT
SPONGE SURGIFOAM ABS GEL 100 (HEMOSTASIS) ×2 IMPLANT
STRIP CLOSURE SKIN 1/2X4 (GAUZE/BANDAGES/DRESSINGS) ×2 IMPLANT
SUT VIC AB 1 CTX 36 (SUTURE) ×2
SUT VIC AB 1 CTX36XBRD ANBCTR (SUTURE) ×1 IMPLANT
SUT VIC AB 2-0 CT1 27 (SUTURE) ×2
SUT VIC AB 2-0 CT1 TAPERPNT 27 (SUTURE) ×1 IMPLANT
SUT VIC AB 3-0 X1 27 (SUTURE) ×2 IMPLANT
SUT VICRYL 0 UR6 27IN ABS (SUTURE) ×2 IMPLANT
SYR 20CC LL (SYRINGE) ×2 IMPLANT
SYR CONTROL 10ML LL (SYRINGE) ×2 IMPLANT
TOWEL OR 17X24 6PK STRL BLUE (TOWEL DISPOSABLE) ×2 IMPLANT
TOWEL OR 17X26 10 PK STRL BLUE (TOWEL DISPOSABLE) ×2 IMPLANT
WATER STERILE IRR 1000ML POUR (IV SOLUTION) IMPLANT

## 2017-11-12 NOTE — Anesthesia Procedure Notes (Signed)
Procedure Name: Intubation Date/Time: 11/12/2017 5:58 PM Performed by: Julian ReilWelty, Chris Cripps F, CRNA Pre-anesthesia Checklist: Patient identified, Emergency Drugs available, Suction available, Patient being monitored and Timeout performed Patient Re-evaluated:Patient Re-evaluated prior to induction Oxygen Delivery Method: Circle system utilized Preoxygenation: Pre-oxygenation with 100% oxygen Induction Type: IV induction Ventilation: Mask ventilation without difficulty Laryngoscope Size: Miller and 3 Grade View: Grade II Tube type: Oral Tube size: 7.5 mm Number of attempts: 1 Airway Equipment and Method: Stylet Placement Confirmation: ETT inserted through vocal cords under direct vision,  positive ETCO2 and breath sounds checked- equal and bilateral Secured at: 24 cm Tube secured with: Tape Dental Injury: Teeth and Oropharynx as per pre-operative assessment  Comments: 4x4s bite block used.

## 2017-11-12 NOTE — Interval H&P Note (Signed)
History and Physical Interval Note:  11/12/2017 5:36 PM  Jeffrey Nelson  has presented today for surgery, with the diagnosis of recurrent HNP  The various methods of treatment have been discussed with the patient and family. After consideration of risks, benefits and other options for treatment, the patient has consented to  Procedure(s): MICRODISCECTOMY L3-4 FOR RECURRENT HNP POSSIBLE FLURO (Left) as a surgical intervention .  The patient's history has been reviewed, patient examined, no change in status, stable for surgery.  I have reviewed the patient's chart and labs.  Questions were answered to the patient's satisfaction.     [name]

## 2017-11-12 NOTE — Progress Notes (Signed)
Patient transported to short stay.  Report called to receiving nurse in short stay.

## 2017-11-12 NOTE — Progress Notes (Signed)
Per Dr. Kari BaarsNitka Ancef ordered is for current surgery and not MRI

## 2017-11-12 NOTE — Transfer of Care (Signed)
Immediate Anesthesia Transfer of Care Note  Patient: Jeffrey Nelson  Procedure(s) Performed: MICRODISCECTOMY L3-4 FOR RECURRENT HNP POSSIBLE FLURO (Left )  Patient Location: PACU  Anesthesia Type:General  Level of Consciousness: awake  Airway & Oxygen Therapy: Patient Spontanous Breathing and Patient connected to face mask oxygen  Post-op Assessment: Report given to RN and Post -op Vital signs reviewed and stable  Post vital signs: Reviewed and stable  Last Vitals:  Vitals:   11/12/17 1406 11/12/17 2015  BP: (!) 145/76   Pulse: 80   Resp: 18   Temp: 36.7 C 36.5 C  SpO2: 97%     Last Pain:  Vitals:   11/12/17 2015  TempSrc:   PainSc: (P) Asleep         Complications: No apparent anesthesia complications

## 2017-11-12 NOTE — Anesthesia Postprocedure Evaluation (Signed)
Anesthesia Post Note  Patient: Leafy HalfMelvin W Bonfanti  Procedure(s) Performed: MICRODISCECTOMY L3-4 FOR RECURRENT HNP POSSIBLE FLURO (Left )     Patient location during evaluation: PACU Anesthesia Type: General Level of consciousness: awake and alert Pain management: pain level controlled Vital Signs Assessment: post-procedure vital signs reviewed and stable Respiratory status: spontaneous breathing, nonlabored ventilation, respiratory function stable and patient connected to nasal cannula oxygen Cardiovascular status: blood pressure returned to baseline and stable Postop Assessment: no apparent nausea or vomiting Anesthetic complications: no    Last Vitals:  Vitals:   11/12/17 2030 11/12/17 2045  BP: (!) 158/69 (!) 160/78  Pulse: 95 96  Resp: (!) 22 13  Temp:  36.6 C  SpO2: 98% 97%    Last Pain:  Vitals:   11/12/17 2015  TempSrc:   PainSc: Asleep                 Ethon Wymer COKER

## 2017-11-12 NOTE — Discharge Instructions (Signed)
    No lifting greater than 10 lbs. Avoid bending, stooping and twisting. Walk in house for first week them may start to get out slowly increasing distance up to one mile by 3 weeks post op. Keep incision dry for 3 days, may use tegaderm or similar water impervious dressing.  

## 2017-11-12 NOTE — H&P (View-Only) (Signed)
Subjective:   Procedure(s) (LRB): MICRODISCECTOMY L3-4 FOR RECURRENT HNP POSSIBLE FLURO (Left) this patient is seen this morning admitted yesterday by my partner Dr. Annell Greening for severe lumbago with left-sided sciatica. He is nearly 4 weeks out from right L3-4 microdiscectomy. Following his surgery he had immediate relief of right leg pain for a period of one week to 10 days. He returned to the office complaining of recurrent pain in his back and radiation the right lower extremity. Findings of right knee extension weakness persistence of pain into his back incision was healed without sign of infection. He however had a cough that was productive and low-grade fevers and chills. Initially placed on a Medrol Dosepak as he had called with severe increasing back pain Friday before. Steroid Dosepak was discontinued and laboratory tests showed a significantly elevated sedimentation rate and CRP. His white cell count also was at 14,000 suggesting a possible infection however plain radiographs were suggestive of peribronchial thickening and he was placed on a Z-Pak. Over the next week this patient's pain on the right side and right lower extremity decreased and he began experiencing pain last week into the left lower extremity. MRI scan was ordered however due to the fact that he has extreme claustrophobia the radiologist recommended for a MRI scan under a general anesthetic and his MRI was delayed until 3/28 however his pain persisted and on a scale of 1-10 indicated his pain was a size of 10. Attempts made to change his MRI scan were unsuccessful as radiology approximately 3 days ago was extremely busy. He was to be placed on a cancellation list and his MRI scan was moved up to 3/23. This weekend the patient was taken to the emergency room as his pain persisted and was severe unrelieved with the use of Percocets. Experiencing increased pain with standing and ambulation. MRI scan was performed yesterday  11/11/2017 that study demonstrated a disc herniation now on the left side at the  L3-4 level that was new compared with previous MRI scan done only 1 month ago that showed a right-sided disc herniation at that time. Previous disc herniation had been resected and there is no sign of recurrent disc herniation on the right side however there is sign of disc herniation on the left side with impression on the left thecal sac left L4 nerve root primarily. Due to severe of lumbago and left-sided sciatica that is unrelieved with the use of narcotic medicines this patient is a candidate to undergo left-sided to microdiscectomy at the L3-4 level. This for a new disc herniation at the same L3-4 level on the opposite side within 1 month of previous disc herniation. Risks of surgery including infection bleeding neurologic compromise discussed with this patient and this morning and he is scheduled to follow my first case this morning at about 11 AM.  Patient reports pain as marked.    Objective: 2  VITALS:  Temp:  [97.7 F (36.5 C)-98 F (36.7 C)] 97.9 F (36.6 C) (03/18 0410) Pulse Rate:  [65-87] 83 (03/18 0410) Resp:  [18-21] 18 (03/18 0410) BP: (123-158)/(53-97) 158/68 (03/18 0410) SpO2:  [93 %-100 %] 100 % (03/18 0410) Weight:  [204 lb (92.5 kg)] 204 lb (92.5 kg) (03/17 1727)  ABD soft Intact pulses distally Left foot DOrsiflexion 4/5, left knee extension 4/5. Positive SLR left, positive right opposite SLR.   LABS Recent Labs    11/11/17 1644  HGB 12.1*  WBC 9.7  PLT 292   Recent  Labs    11/11/17 1644  NA 133*  K 3.9  CL 101  CO2 25  BUN 13  CREATININE 0.78  GLUCOSE 106*   No results for input(s): LABPT, INR in the last 72 hours.   Assessment/Plan:   Procedure(s) (LRB): MICRODISCECTOMY L3-4 FOR RECURRENT HNP POSSIBLE FLURO (Left)  Patient is presently nothing by mouth for surgery this morning a left sided L34 microdiscectomy for new disc herniation left L3-4 versus localized  epidural hematoma versus abscess. Patient's current laboratory tests however are not suggestive of infection. Most recent CRP is 1.4 down from 12 .  Jeffrey Nelson 11/12/2017, 7:47 AM

## 2017-11-12 NOTE — Anesthesia Preprocedure Evaluation (Addendum)
Anesthesia Evaluation  Patient identified by MRN, date of birth, ID band Patient awake    Reviewed: Allergy & Precautions, NPO status , Patient's Chart, lab work & pertinent test results, reviewed documented beta blocker date and time   History of Anesthesia Complications Negative for: history of anesthetic complications  Airway Mallampati: I  TM Distance: >3 FB Neck ROM: Full    Dental  (+) Dental Advisory Given   Pulmonary pneumonia, resolved, former smoker (quit 1958),    breath sounds clear to auscultation       Cardiovascular hypertension, Pt. on medications and Pt. on home beta blockers + CAD, + Past MI and + Cardiac Stents   Rhythm:Regular Rate:Normal  '17 stress: EF= 49% but visually appears well within normal limits.   Neuro/Psych CVA (R eye), Residual Symptoms    GI/Hepatic negative GI ROS, Neg liver ROS,   Endo/Other  diabetes (glu 125, diet controlled)  Renal/GU negative Renal ROS     Musculoskeletal  (+) Arthritis , Osteoarthritis,    Abdominal   Peds  Hematology plavix   Anesthesia Other Findings   Reproductive/Obstetrics                           Anesthesia Physical Anesthesia Plan  ASA: III  Anesthesia Plan: General   Post-op Pain Management:    Induction: Intravenous  PONV Risk Score and Plan: 2 and Treatment may vary due to age or medical condition, Ondansetron and Dexamethasone  Airway Management Planned: Oral ETT  Additional Equipment:   Intra-op Plan:   Post-operative Plan: Extubation in OR  Informed Consent: I have reviewed the patients History and Physical, chart, labs and discussed the procedure including the risks, benefits and alternatives for the proposed anesthesia with the patient or authorized representative who has indicated his/her understanding and acceptance.   Dental advisory given  Plan Discussed with: CRNA and Surgeon  Anesthesia Plan  Comments: (Plan routine monitors, GETA)        Anesthesia Quick Evaluation

## 2017-11-12 NOTE — Progress Notes (Signed)
Received from PACU. S/P Microdiscectomy L3-L4. Dsg to lower back CDI with ice pack. Pt Alert and Oriented x2. Family at bedside. Call bell within reach.

## 2017-11-12 NOTE — Op Note (Signed)
11/12/2017  8:15 PM  PATIENT:  Jeffrey Nelson  83 y.o. male  MRN: [id number]  OPERATIVE REPORT  PRE-OPERATIVE DIAGNOSIS:  recurrent HNP  POST-OPERATIVE DIAGNOSIS:  recurrent HNP  PROCEDURE:  Procedure(s): MICRODISCECTOMY L3-4 FOR RECURRENT HNP POSSIBLE FLURO    SURGEON:  Jessy Oto, MD     ASSISTANT:  Benjiman Core, PA-C  (Present throughout the entire procedure and necessary for completion of procedure in a timely manner)     ANESTHESIA:  General,    COMPLICATIONS:  None.     COMPONENTS:   PROCEDURE:The patient was met in the holding area, and the appropriate left Lumbar level L3-4 identified and marked with "x" and my initials.The patient was then transported to OR and was placed under general anesthesia without difficulty. The patient received appropriate preoperative antibiotic prophylaxis ancef. The patient after intubation atraumatically was transferred to the operating room table, prone position, Caven frame, sliding OR table. All pressure points were well padded. The arms in 90-90 well-padded at the elbows. Standard prep with DuraPrep solution lower dorsal spine to the mid sacral segment. Draped in the usual manner iodine Vi-Drape was used. Time-out procedure was called and correct. The skin incision scar at L3-4 marked. An incision approximately an 3 inch and a half in length was then made through skin and subcutaneous layers ellipsing the old skin incision in line with the expected midline. An incision made into the left lumbosacral fascia approximately 2 inch in length along the left L3-4 spinous processes. The paraspinal muscles elevated from the left side of the spinous processes and lamina of L3 and L4 Boss McCollough retractor then placed and a Kocher placed at the superior aspect of the L4 spinous process a lateral radiograph identified the marker at the level of the L4 upper spinous process. The operating room microscope sterilely draped brought into the field. Under  the operating room microscope, the L3-4 interspace carefully debrided the small amount of muscle attachment here and microcurettes used to define the previous laminotomy left L3-4. A high-speed bur used to drill the medial aspect of the inferior articular process of L3. In order to medialize the laminotomy the left side of the L3 spinous process was thinned using a high speed bur. 2 mm Kerrison then used to enter the spinal canal over the medial aspect of the L3 inferior articular process carefully using the Kerrison to debris the attachment as a curet. Foraminotomy was then performed over the left L4 nerve root. The medial 10% superior articular process of L4 then resected using 2 mm Kerrison. This allowed for identification of the thecal sac. Penfield 4 was then used to carefully mobilize the thecal sac medially and the left L4 nerve root identified within the lateral recess flattened over the posterior aspect of the herniated disc. Carefully the lateral aspect of the L4 nerve root was identified and a Penfield 4 was used to mobilize the nerve medially such that the herniated disc was visible with microscope. Using a Derricho for retraction and a Gaspar Garbe #4 was used to define the rent in the posterior longitudinal ligament within the lateral recess on the left side longitudinally. Disc material immediately extruded and this was removed using micropituitary rongeurs and nerve hook nerve root and then more easily able to be mobilized medially and retracted using a Derricho retractor. Further foraminotomies was performed over the L3 nerve root the nerve root was noted to be exiting without further compression. The nerve root able to be retracted along the  medial aspect of the L4 pedicle and disc material found to be subligamentous at this level was further resected current pituitary rongeurs. Ligamentum flavum was further debrided superiorly to the level L3-4 disc. Had a small amount of further resection of the  L4 lamina inferiorly was performed. With this then the disc space at L3-4 was easily visualized and entry into the disc at the sided disc herniation was possible using a Penfield 4 intraoperative Lateral radiograph was used to identify the L3-4 disc with the Penfield 4 In place at the L3-4 disc space. Micropituitary was used to further debride this material superficially from the posterior aspect of the intervertebral disc is posterior lateral aspect of the disc. Small amount of further disc material was found subligamentous extending inferiorly from the disc this was removed using micropituitary rongeurs. Ligamentum flavum was debrided and lateral recess along the medial aspect L3-4 facet no further decompression was necessary. Ball tip nerve probe was then able to carefully palpate the neuroforamen for L4 and L3 finding these to be well decompressed. Bleeding was then controlled using thrombin-soaked Gelfoam small cottonoids. Small amount of bleeding within the soft tissue mass the laminotomy area was controlled using bipolar electrocautery. Irrigation was carried out using copious amounts of irrigant solution. All Gelfoam were then removed. No significant active bleeding present at the time of removal. All instruments sponge counts were correct traction system was then carefully removed. Lumbodorsal fascia was then carefully approximated with interrupted 0 Vicryl sutures with a UR 6 needle, the deep subcutaneous layers were approximated with interrupted 0 Vicryl sutures on UR 6 the appear subcutaneous layers approximated with interrupted 2-0 Vicryl sutures and the skin closed with a running subcutaneous stitch of 4-0 Vicryl. Dermabond was applied allowed to dry and then Mepilex bandage applied. Patient was then carefully returned to supine position on a stretcher, reactivated and extubated. She was then returned to recovery room in satisfactory condition.  Benjiman Core, PA-C perform the duties of assistant  surgeon during this case. He was present from the beginning of the case to the end of the case assisting in transfer the patient from his stretcher to the OR table and back to the stretcher at the end of the case. Assisted in careful retraction and suction of the laminectomy site delicate neural structures operating under the operating room microscope. He also assisted with closure of the incision from the fascia to the skin applying the dressing.        Basil Dess  11/12/2017, 8:15 PM

## 2017-11-12 NOTE — Brief Op Note (Signed)
11/12/2017  8:10 PM  PATIENT:  Leafy HalfMelvin W Kenner  83 y.o. male  PRE-OPERATIVE DIAGNOSIS:  recurrent HNP  POST-OPERATIVE DIAGNOSIS:  recurrent HNP  PROCEDURE:  Procedure(s): MICRODISCECTOMY L3-4 FOR RECURRENT HNP POSSIBLE FLURO (Left)  SURGEON:  Surgeon(s) and Role:    * Kerrin ChampagneNitka, Jericka Kadar E, MD - Primary  PHYSICIAN ASSISTANT: Zonia KiefJames Owens, PA-C  ANESTHESIA:   general  EBL:  100 mL   BLOOD ADMINISTERED:none  DRAINS: none   LOCAL MEDICATIONS USED:  NONE  SPECIMEN:  No Specimen and Source of Specimen:  Left Posterior disc material and ventral to thecal sac swab and specimen.  DISPOSITION OF SPECIMEN:  Microbiology for anaerobic and aerobic C&S and gram stain.  COUNTS:  YES  TOURNIQUET:  * No tourniquets in log *  DICTATION: .Dragon Dictation  PLAN OF CARE: Admit to inpatient   PATIENT DISPOSITION:  PACU - hemodynamically stable.   Delay start of Pharmacological VTE agent (>24hrs) due to surgical blood loss or risk of bleeding: yes

## 2017-11-12 NOTE — Progress Notes (Signed)
Subjective:   Procedure(s) (LRB): MICRODISCECTOMY L3-4 FOR RECURRENT HNP POSSIBLE FLURO (Left) this patient is seen this morning admitted yesterday by my partner Dr. Annell Greening for severe lumbago with left-sided sciatica. He is nearly 4 weeks out from right L3-4 microdiscectomy. Following his surgery he had immediate relief of right leg pain for a period of one week to 10 days. He returned to the office complaining of recurrent pain in his back and radiation the right lower extremity. Findings of right knee extension weakness persistence of pain into his back incision was healed without sign of infection. He however had a cough that was productive and low-grade fevers and chills. Initially placed on a Medrol Dosepak as he had called with severe increasing back pain Friday before. Steroid Dosepak was discontinued and laboratory tests showed a significantly elevated sedimentation rate and CRP. His white cell count also was at 14,000 suggesting a possible infection however plain radiographs were suggestive of peribronchial thickening and he was placed on a Z-Pak. Over the next week this patient's pain on the right side and right lower extremity decreased and he began experiencing pain last week into the left lower extremity. MRI scan was ordered however due to the fact that he has extreme claustrophobia the radiologist recommended for a MRI scan under a general anesthetic and his MRI was delayed until 3/28 however his pain persisted and on a scale of 1-10 indicated his pain was a size of 10. Attempts made to change his MRI scan were unsuccessful as radiology approximately 3 days ago was extremely busy. He was to be placed on a cancellation list and his MRI scan was moved up to 3/23. This weekend the patient was taken to the emergency room as his pain persisted and was severe unrelieved with the use of Percocets. Experiencing increased pain with standing and ambulation. MRI scan was performed yesterday  11/11/2017 that study demonstrated a disc herniation now on the left side at the  L3-4 level that was new compared with previous MRI scan done only 1 month ago that showed a right-sided disc herniation at that time. Previous disc herniation had been resected and there is no sign of recurrent disc herniation on the right side however there is sign of disc herniation on the left side with impression on the left thecal sac left L4 nerve root primarily. Due to severe of lumbago and left-sided sciatica that is unrelieved with the use of narcotic medicines this patient is a candidate to undergo left-sided to microdiscectomy at the L3-4 level. This for a new disc herniation at the same L3-4 level on the opposite side within 1 month of previous disc herniation. Risks of surgery including infection bleeding neurologic compromise discussed with this patient and this morning and he is scheduled to follow my first case this morning at about 11 AM.  Patient reports pain as marked.    Objective: 2  VITALS:  Temp:  [97.7 F (36.5 C)-98 F (36.7 C)] 97.9 F (36.6 C) (03/18 0410) Pulse Rate:  [65-87] 83 (03/18 0410) Resp:  [18-21] 18 (03/18 0410) BP: (123-158)/(53-97) 158/68 (03/18 0410) SpO2:  [93 %-100 %] 100 % (03/18 0410) Weight:  [204 lb (92.5 kg)] 204 lb (92.5 kg) (03/17 1727)  ABD soft Intact pulses distally Left foot DOrsiflexion 4/5, left knee extension 4/5. Positive SLR left, positive right opposite SLR.   LABS Recent Labs    11/11/17 1644  HGB 12.1*  WBC 9.7  PLT 292   Recent  Labs    11/11/17 1644  NA 133*  K 3.9  CL 101  CO2 25  BUN 13  CREATININE 0.78  GLUCOSE 106*   No results for input(s): LABPT, INR in the last 72 hours.   Assessment/Plan:   Procedure(s) (LRB): MICRODISCECTOMY L3-4 FOR RECURRENT HNP POSSIBLE FLURO (Left)  Patient is presently nothing by mouth for surgery this morning a left sided L34 microdiscectomy for new disc herniation left L3-4 versus localized  epidural hematoma versus abscess. Patient's current laboratory tests however are not suggestive of infection. Most recent CRP is 1.4 down from 12 .  Jeffrey Nelson 11/12/2017, 7:47 AM

## 2017-11-13 ENCOUNTER — Encounter (HOSPITAL_COMMUNITY): Payer: Self-pay | Admitting: Anesthesiology

## 2017-11-13 ENCOUNTER — Encounter (HOSPITAL_COMMUNITY): Payer: Self-pay | Admitting: Specialist

## 2017-11-13 DIAGNOSIS — M5116 Intervertebral disc disorders with radiculopathy, lumbar region: Secondary | ICD-10-CM | POA: Diagnosis not present

## 2017-11-13 DIAGNOSIS — M5416 Radiculopathy, lumbar region: Secondary | ICD-10-CM | POA: Diagnosis not present

## 2017-11-13 LAB — BASIC METABOLIC PANEL
Anion gap: 9 (ref 5–15)
BUN: 12 mg/dL (ref 6–20)
CO2: 25 mmol/L (ref 22–32)
Calcium: 9.3 mg/dL (ref 8.9–10.3)
Chloride: 101 mmol/L (ref 101–111)
Creatinine, Ser: 0.96 mg/dL (ref 0.61–1.24)
Glucose, Bld: 175 mg/dL — ABNORMAL HIGH (ref 65–99)
POTASSIUM: 4.5 mmol/L (ref 3.5–5.1)
SODIUM: 135 mmol/L (ref 135–145)

## 2017-11-13 LAB — CBC
HCT: 33.6 % — ABNORMAL LOW (ref 39.0–52.0)
HEMOGLOBIN: 11.1 g/dL — AB (ref 13.0–17.0)
MCH: 32.1 pg (ref 26.0–34.0)
MCHC: 33 g/dL (ref 30.0–36.0)
MCV: 97.1 fL (ref 78.0–100.0)
PLATELETS: 283 10*3/uL (ref 150–400)
RBC: 3.46 MIL/uL — AB (ref 4.22–5.81)
RDW: 12.6 % (ref 11.5–15.5)
WBC: 11.7 10*3/uL — AB (ref 4.0–10.5)

## 2017-11-13 MED ORDER — PANTOPRAZOLE SODIUM 40 MG PO TBEC
40.0000 mg | DELAYED_RELEASE_TABLET | Freq: Every day | ORAL | Status: DC
  Administered 2017-11-13: 40 mg via ORAL
  Filled 2017-11-13: qty 1

## 2017-11-13 MED ORDER — POLYMYXIN B-TRIMETHOPRIM 10000-0.1 UNIT/ML-% OP SOLN
1.0000 [drp] | Freq: Three times a day (TID) | OPHTHALMIC | Status: AC
  Administered 2017-11-13 – 2017-11-14 (×3): 1 [drp] via OPHTHALMIC
  Filled 2017-11-13: qty 10

## 2017-11-13 MED ORDER — OXYCODONE-ACETAMINOPHEN 5-325 MG PO TABS: 1.0000 | ORAL_TABLET | Freq: Four times a day (QID) | ORAL | 0 refills | Status: DC | PRN

## 2017-11-13 MED ORDER — KETOROLAC TROMETHAMINE 0.5 % OP SOLN
1.0000 [drp] | Freq: Three times a day (TID) | OPHTHALMIC | Status: DC | PRN
  Filled 2017-11-13: qty 3

## 2017-11-13 MED ORDER — BSS IO SOLN
15.0000 mL | Freq: Once | INTRAOCULAR | Status: AC
  Administered 2017-11-13: 15 mL
  Filled 2017-11-13: qty 15

## 2017-11-13 NOTE — Care Management Note (Signed)
Case Management Note  Patient Details  Name: Leafy HalfMelvin W Beal MRN: [id number] Date of Birth: 1935/04/19  Subjective/Objective:                    Action/Plan:  [facility] and following patient.   Patient has all DME from previous recent surgery Expected Discharge Date:                  Expected Discharge Plan:  Home w Home Health Services  In-House Referral:     Discharge planning Services  CM Consult  Post Acute Care Choice:  Home Health Choice offered to:  Patient, Spouse  DME Arranged:  N/A DME Agency:  NA  HH Arranged:  PT HH Agency:  [facility]  Status of Service:  Completed, signed off  If discussed at Long Length of Stay Meetings, dates discussed:    Additional Comments:  Kingsley PlanWile, Taiw[place] Willison Marie, RN 11/13/2017, 11:41 AM

## 2017-11-13 NOTE — Progress Notes (Signed)
Subjective: Preop left leg pain is gone.  He is very pleased.  "am I going home today."  He is complaining of left eye feeling "scratchy" with blurred vision since coming out of surgery yesterday.  Nursing had to reinforce his dressing last night due to saturation.   Objective: Vital signs in last 24 hours: Temp:  [97.7 F (36.5 C)-98.6 F (37 C)] 98.6 F (37 C) (03/19 0447) Pulse Rate:  [62-97] 62 (03/19 0447) Resp:  [13-22] 18 (03/19 0447) BP: (113-160)/(65-78) 113/67 (03/19 0447) SpO2:  [96 %-98 %] 97 % (03/19 0447)  Intake/Output from previous day: 03/18 0701 - 03/19 0700 In: 2651.7 [P.O.:300; I.V.:2251.7; IV Piggyback:100] Out: 1205 [Urine:1105; Blood:100] Intake/Output this shift: No intake/output data recorded.  Recent Labs    11/11/17 1644 11/13/17 0716  HGB 12.1* 11.1*   Recent Labs    11/11/17 1644 11/13/17 0716  WBC 9.7 11.7*  RBC 3.78* 3.46*  HCT 36.3* 33.6*  PLT 292 283   Recent Labs    11/11/17 1644 11/13/17 0716  NA 133* 135  K 3.9 4.5  CL 101 101  CO2 25 25  BUN 13 12  CREATININE 0.78 0.96  GLUCOSE 106* 175*  CALCIUM 8.9 9.3   No results for input(s): LABPT, INR in the last 72 hours.  Exam Very pleasant elderly white male alert and oriented in no acute distress.  Dressing with moderate serosanguineous drainage.  This was removed and upon inspection of the wound there was no active drainage.  Incision intact.  Neurovascular intact.  No focal motor deficits.     Assessment/Plan: In regards to patient's eye complaint I did discuss with Dr. Otelia SergeantNitka and anesthesia department will see him this morning.  We will get patient up with PT today and we need to monitor his drainage from his wound.  If he saturates the dressing was applied this morning we may consider applying an incisional wound VAC.   Jeffrey Nelson 11/13/2017, 8:44 AM

## 2017-11-13 NOTE — Progress Notes (Signed)
Irrigated left eye out with BSS solution - Pt said the eye was feeling better after rinsing - felt the grit that was in the eye coming out about half way through the process.  Pt's eye drops were administered as directed

## 2017-11-13 NOTE — Evaluation (Addendum)
Occupational Therapy Evaluation and Discharge Patient Details Name: Jeffrey Nelson MRN: [id number] DOB: Feb 08, 1935 Today's Date: 11/13/2017    History of Present Illness Pt is an 83 y.o. male who returned to the ED 4 weeks s/p R L3-4 microdiscectomy with back and LLE pain. Pt had MRI scheduled for 11/17/17 but pain became unbearable. 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 Now s/p second L3-4 microdiscectomy 3/18. PMH significant for anginal pain, arthritis, chronic lower back pain, complication of anesthesia, coronary artery disease, high cholesterol, history of blood transfusion, history of kidney stones, HOH, hypertension, myocardial infarction, stroke, and type II diabetes mellitus.    Clinical Impression   PTA, pt limited by significant L LE pain and unable to stand for ADL participation per his report. Prior to onset of pain, pt was completing ADL independently with RW and AE for LB tasks. Pt able to verbalize back precautions with increased time. He required cues to avoid twisting during session as well as for safe toilet transfers as pt attempting to pull up on RW and requiring min assist to correct. Pt educated and able to teach back appropriate method. Wife is able to assist and provide appropriate cues. Pt reports blurry vision with reading tasks and demonstrates decreased ability to hold eye positions out of midline. Educated concerning compensation for visual deficits while being medically treated. Pt and wife report and demonstrate understanding of all education topics and wife is able to provide the necessary assistance. All education complete and no further acute OT services recommended. Do recommend opthamology consult due to visual deficits. Will sign off.     Follow Up Recommendations  No OT follow up;Supervision/Assistance - 24 hour    Equipment Recommendations  None recommended by OT    Recommendations for Other Services       Precautions / Restrictions  Precautions Precautions: Back;Fall Precaution Booklet Issued: No Precaution Comments: Pt declined handout as he reports he has one at home. Able to verbalize 3/3 precautions with increased time.  Restrictions Weight Bearing Restrictions: No      Mobility Bed Mobility               General bed mobility comments: OOB in recliner on my arrival.   Transfers Overall transfer level: Needs assistance Equipment used: Rolling walker (2 wheeled) Transfers: Sit to/from Stand Sit to Stand: Supervision         General transfer comment: Supervision for general sit<>stand. Min guard assist when combined with ADL tasks such as pulling pants over hips.     Balance Overall balance assessment: Needs assistance Sitting-balance support: Feet supported;No upper extremity supported Sitting balance-Leahy Scale: Good     Standing balance support: Bilateral upper extremity supported;During functional activity;No upper extremity supported Standing balance-Leahy Scale: Fair Standing balance comment: BUE support for standing dynamic tasks.                            ADL either performed or assessed with clinical judgement   ADL Overall ADL's : Needs assistance/impaired Eating/Feeding: Set up;Sitting   Grooming: Supervision/safety;Standing   Upper Body Bathing: Set up;Sitting   Lower Body Bathing: Sit to/from stand;Min guard   Upper Body Dressing : Set up;Sitting   Lower Body Dressing: Min guard;Sit to/from stand   Toilet Transfer: Ambulation;RW;Minimal assistance Toilet Transfer Details (indicate cue type and reason): Very unsafe technique attempting to stand from commode by pulling on RW. Required assistance from therapist to control RW and  cues to complete safe technique.  Toileting- Architect and Hygiene: Min guard;Sit to/from stand       Functional mobility during ADLs: Min guard;Rolling walker General ADL Comments: Min guard for general functional  mobility.      Vision Baseline Vision/History: Wears glasses(R eye diminished vision due to CVA) Wears Glasses: At all times Patient Visual Report: Blurring of vision(blurry vision in L eye) Vision Assessment?: Yes Eye Alignment: Within Functional Limits Ocular Range of Motion: Within Functional Limits Alignment/Gaze Preference: Within Defined Limits Tracking/Visual Pursuits: Decreased smoothness of horizontal tracking;Decreased smoothness of vertical tracking(poor ability to sustain eye positions) Saccades: Within functional limits(difficulty following commands) Visual Fields: No apparent deficits Additional Comments: Pt unable to read small print or the second hand on clock. Pt currently demonstrating difficulty maintaining eye positions out of midline with tracking and increased cues to avoid eye movement during field testing.       Perception     Praxis      Pertinent Vitals/Pain Pain Assessment: Faces Faces Pain Scale: Hurts a little bit Pain Location: back at incision Pain Descriptors / Indicators: Aching;Operative site guarding Pain Intervention(s): Monitored during session;Repositioned     Hand Dominance     Extremity/Trunk Assessment Upper Extremity Assessment Upper Extremity Assessment: Overall WFL for tasks assessed   Lower Extremity Assessment Lower Extremity Assessment: Defer to PT evaluation       Communication Communication Communication: HOH(hearing aids)   Cognition Arousal/Alertness: Awake/alert Behavior During Therapy: WFL for tasks assessed/performed Overall Cognitive Status: Within Functional Limits for tasks assessed                                     General Comments  Pt initially hesitant to participate in evaluation. However, per PA, Fayrene Fearing, OT required to assess functional performance.     Exercises     Shoulder Instructions      Home Living Family/patient expects to be discharged to:: Private residence Living  Arrangements: Spouse/significant other Available Help at Discharge: Family;Available 24 hours/day Type of Home: House Home Access: Stairs to enter     Home Layout: One level     Bathroom Shower/Tub: Producer, television/film/video: Handicapped height     Home Equipment: Environmental consultant - 2 wheels;Adaptive equipment Adaptive Equipment: Reacher;Sock aid Additional Comments: Pt declines use of shower seat.       Prior Functioning/Environment Level of Independence: Independent with assistive device(s)        Comments: Independent with RW until onset of pain and then very limited in his abilities.         OT Problem List: Impaired balance (sitting and/or standing);Decreased safety awareness      OT Treatment/Interventions:      OT Goals(Current goals can be found in the care plan section) Acute Rehab OT Goals Patient Stated Goal: go home OT Goal Formulation: With patient/family Time For Goal Achievement: 11/27/17 Potential to Achieve Goals: Good  OT Frequency:     Barriers to D/C:            Co-evaluation              AM-PAC PT "6 Clicks" Daily Activity     Outcome Measure Help from another person eating meals?: None Help from another person taking care of personal grooming?: A Little Help from another person toileting, which includes using toliet, bedpan, or urinal?: A Little Help from another person bathing (including  washing, rinsing, drying)?: A Little Help from another person to put on and taking off regular upper body clothing?: A Little Help from another person to put on and taking off regular lower body clothing?: A Little 6 Click Score: 19   End of Session Equipment Utilized During Treatment: Rolling walker Nurse Communication: Mobility status  Activity Tolerance: Patient tolerated treatment well Patient left: in chair;with call bell/phone within reach;with family/visitor present(with Dr. Otelia SergeantNitka present)  OT Visit Diagnosis: Unsteadiness on feet  (R26.81);Other abnormalities of gait and mobility (R26.89);Muscle weakness (generalized) (M62.81)                Time: [id number] OT Time Calculation (min): 14 min Charges:  OT General Charges $OT Visit: 1 Visit OT Evaluation $OT Eval Low Complexity: 1 Low G-Codes:     Doristine Sectionharity A Raejean Swinford, MS OTR/L  Pager: [phone]   Jaleigha Deane A Khaled Herda 11/13/2017, 10:25 AM

## 2017-11-13 NOTE — Progress Notes (Signed)
PT Cancellation Note  Patient Details Name: Jeffrey Nelson MRN: [id number] DOB: 01-06-1935   Cancelled Treatment:    Reason Eval/Treat Not Completed: Pain limiting ability to participate. Patient in bed upon arrival and refused physical therapy services, stating "I just walked around the hall and now my leg is really hurting from that increased weight." RN present and aware. Pt agreeable to PT following up this afternoon.   Laurina Bustlearoline Cabria Micalizzi, PT, DPT Acute Rehabilitation Services  Pager: [phone]    Vanetta MuldersCarloine H Ameia Morency 11/13/2017, 11:20 AM

## 2017-11-13 NOTE — Evaluation (Signed)
Physical Therapy Evaluation Patient Details Name: Jeffrey Nelson MRN: [id number] DOB: February 03, 1935 Today's Date: 11/13/2017   History of Present Illness  Pt is an 83 y.o. male who returned to the ED 4 weeks s/p R L3-4 microdiscectomy with back and LLE pain. Pt had MRI scheduled for 11/17/17 but pain became unbearable. 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 Now s/p second L3-4 microdiscectomy 3/18. PMH significant for anginal pain, arthritis, chronic lower back pain, complication of anesthesia, coronary artery disease, high cholesterol, history of blood transfusion, history of kidney stones, HOH, hypertension, myocardial infarction, stroke, and type II diabetes mellitus.   Clinical Impression  Patient evaluated by Physical Therapy with no further acute PT needs identified. All education has been completed and the patient has no further questions. Patient appears to be close to baseline based on patient and patient wife's report. Main limitation is left lower extremity pain. Patient was very active prior and enjoys gardening and yard work. See below for any follow-up Physical Therapy or equipment needs. PT is signing off. Thank you for this referral.     Follow Up Recommendations Home health PT    Equipment Recommendations  None recommended by PT    Recommendations for Other Services       Precautions / Restrictions Precautions Precautions: Back;Fall Precaution Booklet Issued: No Precaution Comments: Pt declined handout with OT as he reports he has one at home. Restrictions Weight Bearing Restrictions: No      Mobility  Bed Mobility               General bed mobility comments: Seated EOB upon arrival  Transfers Overall transfer level: Needs assistance Equipment used: Rolling walker (2 wheeled) Transfers: Sit to/from Stand Sit to Stand: Supervision         General transfer comment: Patient requiring supervision for safety for sit to stand transfers from low surface.  Unable to power up on first attempt but repositioned to edge of bed and successfully stood on second trial.  Ambulation/Gait Ambulation/Gait assistance: Supervision Ambulation Distance (Feet): 270 Feet Assistive device: Rolling walker (2 wheeled) Gait Pattern/deviations: Step-through pattern   Gait velocity interpretation: Below normal speed for age/gender General Gait Details: Patient with decreased bilateral foot clearance but able to correct when provided with cueing.   Stairs            Wheelchair Mobility    Modified Rankin (Stroke Patients Only)       Balance Overall balance assessment: Needs assistance Sitting-balance support: Feet supported;No upper extremity supported Sitting balance-Leahy Scale: Good     Standing balance support: Bilateral upper extremity supported;During functional activity;No upper extremity supported Standing balance-Leahy Scale: Fair                               Pertinent Vitals/Pain Pain Assessment: 0-10 Pain Score: 6  Pain Location: LLE Pain Intervention(s): Monitored during session;Other (comment);Limited activity within patient's tolerance(RN present and aware)    Home Living Family/patient expects to be discharged to:: Private residence Living Arrangements: Spouse/significant other Available Help at Discharge: Family;Available 24 hours/day Type of Home: House Home Access: Stairs to enter Entrance Stairs-Rails: Right Entrance Stairs-Number of Steps: 5 Home Layout: One level Home Equipment: Walker - 2 wheels;Adaptive equipment Additional Comments: Patient uses RW to climb steps     Prior Function Level of Independence: Independent with assistive device(s)         Comments: Independent with RW for community  ambulation.      Hand Dominance        Extremity/Trunk Assessment   Upper Extremity Assessment Upper Extremity Assessment: Overall WFL for tasks assessed    Lower Extremity Assessment Lower  Extremity Assessment: Overall WFL for tasks assessed    Cervical / Trunk Assessment Cervical / Trunk Assessment: Normal  Communication   Communication: HOH(hearing aids)  Cognition Arousal/Alertness: Awake/alert Behavior During Therapy: WFL for tasks assessed/performed Overall Cognitive Status: Within Functional Limits for tasks assessed                                        General Comments General comments (skin integrity, edema, etc.): Patient wife present throughout session.     Exercises     Assessment/Plan    PT Assessment Patent does not need any further PT services  PT Problem List         PT Treatment Interventions      PT Goals (Current goals can be found in the Care Plan section)  Acute Rehab PT Goals Patient Stated Goal: Return to gardening and yard work PT Goal Formulation: With patient Time For Goal Achievement: 11/18/17 Potential to Achieve Goals: Good    Frequency     Barriers to discharge        Co-evaluation               AM-PAC PT "6 Clicks" Daily Activity  Outcome Measure Difficulty turning over in bed (including adjusting bedclothes, sheets and blankets)?: A Little Difficulty moving from lying on back to sitting on the side of the bed? : A Little Difficulty sitting down on and standing up from a chair with arms (e.g., wheelchair, bedside commode, etc,.)?: A Little Help needed moving to and from a bed to chair (including a wheelchair)?: A Little Help needed walking in hospital room?: A Little Help needed climbing 3-5 steps with a railing? : A Little 6 Click Score: 18    End of Session Equipment Utilized During Treatment: Gait belt Activity Tolerance: Patient tolerated treatment well Patient left: in bed;with call bell/phone within reach;with family/visitor present Nurse Communication: Mobility status PT Visit Diagnosis: Pain;Unsteadiness on feet (R26.81) Pain - Right/Left: Left Pain - part of body: Leg    Time:  1351-1413 PT Time Calculation (min) (ACUTE ONLY): 22 min   Charges:   PT Evaluation $PT Eval Moderate Complexity: 1 Mod     PT G Codes:        Laurina Bustlearoline Starling Jessie, PT, DPT Acute Rehabilitation Services  Pager: [phone]   Vanetta MuldersCarloine H Tondra Reierson 11/13/2017, 2:36 PM

## 2017-11-13 NOTE — Progress Notes (Signed)
Notified Anesthesiology of completion of irrigation and administration of eye drops

## 2017-11-13 NOTE — Progress Notes (Signed)
Anesthesiologist called this AM about the Pt's L eye, indicated that he would review and place orders.   As I reviewed the Pt's chart this afternoon, I noticed signed and held order for Pre-Admission orders placed this morning from Dr. Sampson GoonFitzgerald for eye care for the patient.  I have released the orders

## 2017-11-13 NOTE — Progress Notes (Signed)
Patient ID: Jeffrey Nelson, male   DOB: 09-12-1934, 83 y.o.   MRN: [id number]   This afternoon I spoke with anesthesiologist Dr. Mal AmabileBrock regarding patient's complaint of his eye feeling "scratchy blurry vision."  He will have patient evaluated this afternoon by his team.

## 2017-11-14 NOTE — Progress Notes (Signed)
Discharge instructions reviewed with the Pt and his wife this AM. Pt was given his discharge instructions and prescriptions.  Pt advised that he had all of his equipment at home, and had not needs.  Pt had his appointments for follow ups.  Pt left via w/c with volunteers - met his wife, Pt discharged home.

## 2017-11-16 ENCOUNTER — Telehealth (INDEPENDENT_AMBULATORY_CARE_PROVIDER_SITE_OTHER): Payer: Self-pay | Admitting: Specialist

## 2017-11-16 NOTE — Telephone Encounter (Signed)
Thayer Ohmhris, [facility] verbal orders for patient  1x for 1 week 2x for 2 weeks 1x for 2 weeks  CB# [phone]

## 2017-11-17 ENCOUNTER — Other Ambulatory Visit: Payer: Medicare HMO

## 2017-11-18 LAB — AEROBIC/ANAEROBIC CULTURE (SURGICAL/DEEP WOUND): CULTURE: NO GROWTH

## 2017-11-18 LAB — AEROBIC/ANAEROBIC CULTURE W GRAM STAIN (SURGICAL/DEEP WOUND)

## 2017-11-19 NOTE — Telephone Encounter (Signed)
I called and gave verbal ok for the PT orders

## 2017-11-20 NOTE — Discharge Summary (Signed)
Patient ID: Jeffrey Nelson MRN: [id number] DOB/AGE: 01/22/1935 83 y.o.  Admit date: 11/11/2017 Discharge date: 11/20/2017  Admission Diagnoses:  Active Problems:   Lumbar disc herniation with radiculopathy   Postoperative pain after spinal surgery   Status post lumbar laminectomy   Discharge Diagnoses:  Active Problems:   Lumbar disc herniation with radiculopathy   Postoperative pain after spinal surgery   Status post lumbar laminectomy  status post Procedure(s): MICRODISCECTOMY L3-4 FOR RECURRENT HNP POSSIBLE FLURO  Past Medical History:  Diagnosis Date  . Anginal pain (HCC)    occ  . Arthritis    "joints" (10/09/2017)  . Chronic lower back pain   . Complication of anesthesia    extremely claustrophobic; "have to be put down for MRI; don't sit in back seat of car;, etc."  . Coronary artery disease   . High cholesterol    "get shots twice/month" (10/09/2017)  . History of blood transfusion 2001   "related to CABG"  . History of kidney stones   . HOH (hard of hearing)    wearing hearing aids  . Hypertension   . Myocardial infarction (HCC) 2000s-06/2017 X 4  . Stroke [facility]) ~ 2015   "in my right eye; sight is coming back little by little" (10/09/2017)  . Type II diabetes mellitus (HCC)     Surgeries: Procedure(s): MICRODISCECTOMY L3-4 FOR RECURRENT HNP POSSIBLE FLURO on 11/12/2017   Consultants:   Discharged Condition: Improved  Hospital Course: Jeffrey Nelson is an 83 y.o. male who was admitted 11/11/2017 for operative treatment of lumbar recurrent HNP. Patient failed conservative treatments (please see the history and physical for the specifics) and had severe unremitting pain that affects sleep, daily activities and work/hobbies. After pre-op clearance, the patient was taken to the operating room on 11/12/2017 and underwent  Procedure(s): MICRODISCECTOMY L3-4 FOR RECURRENT HNP POSSIBLE FLURO.    Patient was given perioperative antibiotics:  Anti-infectives  (From admission, onward)   Start     Dose/Rate Route Frequency Ordered Stop   11/13/17 0000  ceFAZolin (ANCEF) IVPB 2g/100 mL premix     2 g 200 mL/hr over 30 Minutes Intravenous Every 8 hours 11/12/17 2101 11/13/17 0850   11/12/17 1730  ceFAZolin (ANCEF) IVPB 2g/100 mL premix     2 g 200 mL/hr over 30 Minutes Intravenous  Once 11/12/17 1716 11/12/17 1815   11/12/17 1719  ceFAZolin (ANCEF) 2-4 GM/100ML-% IVPB    Note to Pharmacy:  Shireen Quan   : cabinet override      11/12/17 1719 11/12/17 1800       Patient was given sequential compression devices and early ambulation to prevent DVT.   Patient benefited maximally from hospital stay and there were no complications. At the time of discharge, the patient was urinating/moving their bowels without difficulty, tolerating a regular diet, pain is controlled with oral pain medications and they have been cleared by PT/OT.   Recent vital signs: No data found.   Recent laboratory studies: No results for input(s): WBC, HGB, HCT, PLT, NA, K, CL, CO2, BUN, CREATININE, GLUCOSE, INR, CALCIUM in the last 72 hours.  Invalid input(s): PT, 2   Discharge Medications:   Allergies as of 11/14/2017      Reactions   Ezetimibe Other (See Comments)   Cramps   Morphine And Related Other (See Comments)   Confusion   Statins Other (See Comments)   Cramps      Medication List    STOP taking these medications  methylPREDNISolone 4 MG Tbpk tablet Commonly known as:  MEDROL DOSEPAK   oxyCODONE 10 mg 12 hr tablet Commonly known as:  OXYCONTIN   zolpidem 10 MG tablet Commonly known as:  AMBIEN     TAKE these medications   amLODipine 10 MG tablet Commonly known as:  NORVASC   aspirin EC 81 MG tablet Take 81 mg by mouth daily.   clopidogrel 75 MG tablet Commonly known as:  PLAVIX Take 75 mg by mouth daily.   COQ10 PO Take 1 tablet by mouth daily.   Cranberry 500 MG Tabs Take 500 mg by mouth daily.   docusate sodium 100 MG  capsule Commonly known as:  COLACE Take 200 mg by mouth 2 (two) times daily as needed for mild constipation.   fluticasone 50 MCG/ACT nasal spray Commonly known as:  FLONASE Place 2 sprays into both nostrils as needed for allergies or rhinitis.   gabapentin 300 MG capsule Commonly known as:  NEURONTIN Take 1 capsule (300 mg total) by mouth at bedtime for 7 days, THEN 1 capsule (300 mg total) 2 (two) times daily for 28 days. Start taking on:  10/31/2017 What changed:  See the new instructions.   isosorbide mononitrate 30 MG 24 hr tablet Commonly known as:  IMDUR Take 30 mg by mouth daily.   lisinopril 5 MG tablet Commonly known as:  PRINIVIL,ZESTRIL Take 5 mg by mouth daily.   metoprolol tartrate 50 MG tablet Commonly known as:  LOPRESSOR Take 50 mg by mouth 2 (two) times daily.   multivitamin with minerals Tabs tablet Take 1 tablet by mouth daily.   oxyCODONE-acetaminophen 5-325 MG tablet Commonly known as:  PERCOCET/ROXICET Take 1 tablet by mouth every 6 (six) hours as needed for severe pain.   REPATHA 140 MG/ML Sosy Generic drug:  Evolocumab Inject 140 mg into the skin every 14 (fourteen) days.       Diagnostic Studies: Mr Lumbar Spine W Wo Contrast  Result Date: 11/11/2017 CLINICAL DATA:  History of recent back surgery 1 month ago. Worsening pain. EXAM: MRI LUMBAR SPINE WITHOUT AND WITH CONTRAST TECHNIQUE: Multiplanar and multiecho pulse sequences of the lumbar spine were obtained without and with intravenous contrast. CONTRAST:  19mL MULTIHANCE GADOBENATE DIMEGLUMINE 529 MG/ML IV SOLN COMPARISON:  MRI 10/12/2017 FINDINGS: Segmentation: There are five lumbar type vertebral bodies. The last full intervertebral disc space is labeled L5-S1. This correlates with the prior examination. Alignment:  Normal in stable. Vertebrae: Normal marrow signal except for mild endplate reactive changes and enhancement at L3-4 likely due to the recent surgery. Conus medullaris and cauda equina:  Conus extends to the T12-L1 level. Conus and cauda equina appear normal. Paraspinal and other soft tissues: No significant findings. Mild gallbladder distention. Disc levels: L1-2: Diffuse bulging annulus with mild stable bilateral lateral recess encroachment. L2-3: Bulging annulus and facet disease with mild bilateral lateral recess encroachment. L3-4: Right-sided laminectomy defect with moderate enhancement and a fluid collection adjacent to the thecal sac and extending back along the spinous process. This is probably postoperative seroma. Could not totally exclude the possibility of a CSF leak or abscess but recommend correlation with clinical findings. Reimaging at some point may be helpful. There is a new moderate-sized left paracentral and left medial foraminal disc protrusion with mass effect on the thecal sac. This likely affects both the left L3 and L4 nerve roots. L4-5: Left-sided laminectomy defect. No spinal or foraminal stenosis. Moderate facet disease. L5-S1: No significant findings. IMPRESSION: 1. Postoperative changes at L3-4  with a right-sided laminectomy defect. There is a fluid collection in the laminectomy bed with surrounding enhancing inflammatory changes. This may be normal postoperative change 1 month after surgery. Recommend clinical correlation. 2. New left-sided disc protrusion at L3-4 likely effecting both the L3 and L4 nerve roots. 3. Postoperative changes at L4-5 without recurrent disc protrusion, spinal or foraminal stenosis. 4. Stable mild bilateral lateral recess stenosis at L1-2 and L2-3. Electronically Signed   By: Rudie Meyer M.D.   On: 11/11/2017 15:06   Dg Lumbar Spine 1 View  Result Date: 11/12/2017 CLINICAL DATA:  Microdiskectomy at L3-4 EXAM: DG C-ARM 61-120 MIN; LUMBAR SPINE - 1 VIEW COMPARISON:  MRI from 11/11/2017 FINDINGS: Radiation safety time-out was performed. A total of 3 seconds of fluoroscopic time was utilized. Two lateral intraoperative views of the lower  lumbar spine are provided. There is moderate disc flattening at L4-5 one image demonstrates a thick metallic marker overlying the posterior elements at L4 and a second with retractors and thin metallic curved probe adjacent to the posterosuperior corner of L4 at the L3-4 disc level. IMPRESSION: Localizing images during L3-4 microdiscectomy. 3 seconds of fluoroscopic time was utilized. Electronically Signed   By: Tollie Eth M.D.   On: 11/12/2017 19:32   Dg C-arm 1-60 Min  Result Date: 11/12/2017 CLINICAL DATA:  Microdiskectomy at L3-4 EXAM: DG C-ARM 61-120 MIN; LUMBAR SPINE - 1 VIEW COMPARISON:  MRI from 11/11/2017 FINDINGS: Radiation safety time-out was performed. A total of 3 seconds of fluoroscopic time was utilized. Two lateral intraoperative views of the lower lumbar spine are provided. There is moderate disc flattening at L4-5 one image demonstrates a thick metallic marker overlying the posterior elements at L4 and a second with retractors and thin metallic curved probe adjacent to the posterosuperior corner of L4 at the L3-4 disc level. IMPRESSION: Localizing images during L3-4 microdiscectomy. 3 seconds of fluoroscopic time was utilized. Electronically Signed   By: Tollie Eth M.D.   On: 11/12/2017 19:32   Xr Lumbar Spine 2-3 Views  Result Date: 10/31/2017 AP and lateral flexion and extensin radiographs with degeneraitive disc narrowing L4-5 and L5-S1 greater than L3-4. There is grade 1 anterolisthesis L4-5 that is minimal 2-3 mm, unchanged from pre op.      Follow-up Information    Kerrin Champagne, MD. Schedule an appointment as soon as possible for a visit today.   Specialty:  Orthopedic Surgery Why:  need return office visit one week.  Contact information: [address] [place] [address] [phone]           Discharge Plan:  discharge to home  Disposition:     Signed: Zonia Kief  11/20/2017, 3:46 PM

## 2017-11-22 ENCOUNTER — Ambulatory Visit (INDEPENDENT_AMBULATORY_CARE_PROVIDER_SITE_OTHER): Payer: Medicare HMO

## 2017-11-22 ENCOUNTER — Ambulatory Visit (INDEPENDENT_AMBULATORY_CARE_PROVIDER_SITE_OTHER): Payer: Medicare HMO | Admitting: Specialist

## 2017-11-22 ENCOUNTER — Ambulatory Visit (HOSPITAL_COMMUNITY): Payer: Medicare HMO

## 2017-11-22 ENCOUNTER — Telehealth (INDEPENDENT_AMBULATORY_CARE_PROVIDER_SITE_OTHER): Payer: Self-pay | Admitting: Specialist

## 2017-11-22 ENCOUNTER — Ambulatory Visit (HOSPITAL_COMMUNITY): Admission: RE | Admit: 2017-11-22 | Payer: Medicare HMO | Source: Ambulatory Visit

## 2017-11-22 ENCOUNTER — Encounter (INDEPENDENT_AMBULATORY_CARE_PROVIDER_SITE_OTHER): Payer: Self-pay | Admitting: Specialist

## 2017-11-22 ENCOUNTER — Encounter (HOSPITAL_COMMUNITY): Admission: RE | Payer: Self-pay | Source: Ambulatory Visit

## 2017-11-22 VITALS — BP 94/50 | HR 57 | Ht 74.0 in | Wt 210.0 lb

## 2017-11-22 DIAGNOSIS — M5442 Lumbago with sciatica, left side: Secondary | ICD-10-CM

## 2017-11-22 DIAGNOSIS — M7062 Trochanteric bursitis, left hip: Secondary | ICD-10-CM | POA: Diagnosis not present

## 2017-11-22 DIAGNOSIS — G8929 Other chronic pain: Secondary | ICD-10-CM

## 2017-11-22 SURGERY — MRI WITH ANESTHESIA
Anesthesia: General

## 2017-11-22 MED ORDER — OXYCODONE HCL ER 15 MG PO T12A: 15.0000 mg | EXTENDED_RELEASE_TABLET | Freq: Two times a day (BID) | ORAL | Status: DC

## 2017-11-22 MED ORDER — METHYLPREDNISOLONE ACETATE 40 MG/ML IJ SUSP
40.0000 mg | INTRAMUSCULAR | Status: AC | PRN
  Administered 2017-11-22: 40 mg via INTRA_ARTICULAR

## 2017-11-22 MED ORDER — BUPIVACAINE HCL 0.5 % IJ SOLN
3.0000 mL | INTRAMUSCULAR | Status: AC | PRN
  Administered 2017-11-22: 3 mL via INTRA_ARTICULAR

## 2017-11-22 MED ORDER — OXYCODONE HCL ER 20 MG PO T12A: 20.0000 mg | EXTENDED_RELEASE_TABLET | Freq: Two times a day (BID) | ORAL | 0 refills | Status: DC

## 2017-11-22 NOTE — Telephone Encounter (Signed)
Tried to sched pt for follow up appt in 11 days no availability for established pt

## 2017-11-22 NOTE — Patient Instructions (Signed)
Avoid frequent bending and stooping  No lifting greater than 10 lbs. May use ice or moist heat for pain. Weight loss is of benefit. Handicap license is approved.   

## 2017-11-22 NOTE — Addendum Note (Signed)
Addended by: Vira BrownsNITKA, JAMES on: 11/22/2017 12:37 PM   Modules accepted: Orders

## 2017-11-22 NOTE — Progress Notes (Addendum)
Post-Op Visit Note   Patient: Jeffrey Nelson           Date of Birth: 12/31/1934           MRN: [id number] Visit Date: 11/22/2017 PCP: Patient, No Pcp Per   Assessment & Plan:11 days post left L3-4 microdiscectomy, 5 1/2 weeks post right L3-4 microdiscectomy. Left greater trochanteric bursitis.  Chief Complaint:  Chief Complaint  Patient presents with  . Lower Back - Routine Post Op   Visit Diagnoses:  1. Chronic left-sided low back pain with left-sided sciatica   2. Greater trochanteric bursitis, left   Has severe back greater than leg pain. Clinically negative neurotension signs. Incision is healed. No sign of instability or listhesis at L3-4. Chronic listhesis at L4-5 that is stable and minimal. I expect mechanical pain and I am hopeful that he has no recurrent HNP. Lack of neurotension sign is encourageing. Unable to take NSAIDs and I am reluctant to prescribe further steroid. Lab tests of culture from OR negative for infection.  Will prescribe oxycontin 20mg  q 12 hours and hopely this will provide adequate relief until post operaive swelling and nerve irritation improves.   Plan: Avoid frequent bending and stooping  No lifting greater than 10 lbs. May use ice or moist heat for pain. Weight loss is of benefit. Handicap license is approved.  Procedure Note  Patient: Jeffrey Nelson             Date of Birth: 12/31/1934           MRN: [id number]             Visit Date: 11/22/2017  Procedures: Visit Diagnoses: Chronic left-sided low back pain with left-sided sciatica - Plan: XR Lumbar Spine 2-3 Views, oxyCODONE (OXYCONTIN) 12 hr tablet 15 mg  Greater trochanteric bursitis, left  Large Joint Inj: L greater trochanter on 11/22/2017 12:31 PM Indications: pain Details: 22 G 3.5 in needle, superolateral approach  Arthrogram: No  Medications: 40 mg methylPREDNISolone acetate 40 MG/ML; 3 mL bupivacaine 0.5 % Outcome: tolerated well, no immediate complications  Bandaid  applied  Procedure, treatment alternatives, risks and benefits explained, specific risks discussed. Consent was given by the patient. Immediately prior to procedure a time out was called to verify the correct patient, procedure, equipment, support staff and site/side marked as required. Patient was prepped and draped in the usual sterile fashion.        Follow-Up Instructions: No follow-ups on file.   Orders:  Orders Placed This Encounter  Procedures  . XR Lumbar Spine 2-3 Views   No orders of the defined types were placed in this encounter.   Imaging: No results found.  PMFS History: Patient Active Problem List   Diagnosis Date Noted  . Lumbar disc herniation with radiculopathy 10/13/2017    Priority: High    Class: Acute  . Radiculopathy, lumbar region 10/09/2017    Priority: High  . Pain in right hip 10/09/2017    Priority: High    Class: Acute  . Right knee sprain 10/09/2017    Priority: High    Class: Acute  . Spinal stenosis in cervical region 05/14/2015    Priority: High    Class: Chronic  . Cubital tunnel syndrome on left 05/14/2015    Priority: High    Class: Chronic  . Spinal stenosis, lumbar region, with neurogenic claudication 01/16/2014    Priority: High    Class: Chronic  . Diabetes mellitus type 2, uncomplicated (HCC) 10/14/2017  Priority: Medium    Class: Chronic  . Status post lumbar laminectomy 11/12/2017  . Postoperative pain after spinal surgery 11/11/2017  . Weakness of right lower extremity   . Sciatica associated with disorder of lumbosacral spine   . Surgery, elective   . Hip pain, acute, right 10/09/2017  . Bradycardia 10/09/2017  . Spondylolisthesis, lumbar region 10/05/2017  . Knee contusion 07/24/2016  . Stenosis of cervical spine with myelopathy 05/14/2015   Past Medical History:  Diagnosis Date  . Anginal pain (HCC)    occ  . Arthritis    "joints" (10/09/2017)  . Chronic lower back pain   . Complication of anesthesia     extremely claustrophobic; "have to be put down for MRI; don't sit in back seat of car;, etc."  . Coronary artery disease   . High cholesterol    "get shots twice/month" (10/09/2017)  . History of blood transfusion 2001   "related to CABG"  . History of kidney stones   . HOH (hard of hearing)    wearing hearing aids  . Hypertension   . Myocardial infarction (HCC) 2000s-06/2017 X 4  . Stroke [facility]) ~ 2015   "in my right eye; sight is coming back little by little" (10/09/2017)  . Type II diabetes mellitus (HCC)     No family history on file.  Past Surgical History:  Procedure Laterality Date  . ANTERIOR CERVICAL DECOMP/DISCECTOMY FUSION N/A 05/14/2015   Procedure: C3-4  ANTERIOR CERVICAL DISCECTOMY AND FUSION WITH PLATE AND SCREWS, LOCAL AND ALLOGRAFT BONE GRAFT;  Surgeon: Kerrin Champagne, MD;  Location: [facility] OR;  Service: Orthopedics;  Laterality: N/A;  . BACK SURGERY    . CARDIAC CATHETERIZATION     X 17 (10/09/2017)  . CATARACT EXTRACTION W/ INTRAOCULAR LENS  IMPLANT, BILATERAL Bilateral   . COLONOSCOPY    . CORONARY ANGIOPLASTY  06/2017  . CORONARY ANGIOPLASTY WITH STENT PLACEMENT     "6 stents total" (10/09/2017)  . CORONARY ARTERY BYPASS GRAFT  2001   CABG X4  . CYSTOSCOPY W/ STONE MANIPULATION    . JOINT REPLACEMENT    . LUMBAR LAMINECTOMY Right 10/13/2017   Procedure: MICRODISCECTOMY LUMBAR LAMINECTOMY RIGHT L3-4;  Surgeon: Kerrin Champagne, MD;  Location: [facility] OR;  Service: Orthopedics;  Laterality: Right;  . LUMBAR LAMINECTOMY Left 11/12/2017   Procedure: MICRODISCECTOMY L3-4 FOR RECURRENT HNP POSSIBLE FLURO;  Surgeon: Kerrin Champagne, MD;  Location: [facility] OR;  Service: Orthopedics;  Laterality: Left;  . LUMBAR LAMINECTOMY/DECOMPRESSION MICRODISCECTOMY N/A 01/16/2014   Procedure: Left L3-4 and L4-5 foraminotomies;  Surgeon: Kerrin Champagne, MD;  Location: [facility] OR;  Service: Orthopedics;  Laterality: N/A;  . NASAL FRACTURE SURGERY    . NASAL SINUS SURGERY     "several times since 1959"  .  RADIOLOGY WITH ANESTHESIA N/A 10/12/2017   Procedure: MRI WITH ANESTHESIA;  Surgeon: Radiologist, Medication, MD;  Location: [facility] OR;  Service: Radiology;  Laterality: N/A;  . SHOULDER OPEN ROTATOR CUFF REPAIR Bilateral   . TOTAL KNEE ARTHROPLASTY Right 2012  . TYMPANOSTOMY TUBE PLACEMENT Bilateral   . ULNAR NERVE TRANSPOSITION Left 05/14/2015   Procedure: LEFT ULNAR NERVE DECOMPRESSION AT THE ELBOW;  Surgeon: Kerrin Champagne, MD;  Location: [facility] OR;  Service: Orthopedics;  Laterality: Left;   Social History   Occupational History  . Not on file  Tobacco Use  . Smoking status: Former Smoker    Packs/day: 1.00    Years: 2.00    Pack years: 2.00  Last attempt to quit: 01/09/1957    Years since quitting: 60.9  . Smokeless tobacco: Never Used  Substance and Sexual Activity  . Alcohol use: No  . Drug use: No  . Sexual activity: Not Currently

## 2017-11-22 NOTE — Telephone Encounter (Signed)
Scheduled for 12/03/17

## 2017-12-03 ENCOUNTER — Emergency Department (HOSPITAL_COMMUNITY): Payer: Medicare HMO

## 2017-12-03 ENCOUNTER — Other Ambulatory Visit: Payer: Self-pay

## 2017-12-03 ENCOUNTER — Ambulatory Visit (INDEPENDENT_AMBULATORY_CARE_PROVIDER_SITE_OTHER): Payer: Medicare HMO | Admitting: Specialist

## 2017-12-03 ENCOUNTER — Observation Stay (HOSPITAL_COMMUNITY): Payer: Medicare HMO

## 2017-12-03 ENCOUNTER — Encounter (INDEPENDENT_AMBULATORY_CARE_PROVIDER_SITE_OTHER): Payer: Self-pay | Admitting: Specialist

## 2017-12-03 ENCOUNTER — Encounter (HOSPITAL_COMMUNITY): Payer: Self-pay | Admitting: Emergency Medicine

## 2017-12-03 ENCOUNTER — Inpatient Hospital Stay (HOSPITAL_COMMUNITY)
Admission: EM | Admit: 2017-12-03 | Discharge: 2017-12-17 | DRG: 454 | Disposition: A | Discharge status: Home / Self Care | Payer: Medicare HMO | Attending: Internal Medicine | Admitting: Internal Medicine

## 2017-12-03 VITALS — BP 73/42 | HR 53 | Ht 74.0 in | Wt 210.0 lb

## 2017-12-03 DIAGNOSIS — Z888 Allergy status to other drugs, medicaments and biological substances status: Secondary | ICD-10-CM

## 2017-12-03 DIAGNOSIS — M48062 Spinal stenosis, lumbar region with neurogenic claudication: Secondary | ICD-10-CM | POA: Diagnosis not present

## 2017-12-03 DIAGNOSIS — E860 Dehydration: Secondary | ICD-10-CM | POA: Diagnosis present

## 2017-12-03 DIAGNOSIS — Z9841 Cataract extraction status, right eye: Secondary | ICD-10-CM

## 2017-12-03 DIAGNOSIS — R509 Fever, unspecified: Secondary | ICD-10-CM

## 2017-12-03 DIAGNOSIS — I9589 Other hypotension: Secondary | ICD-10-CM | POA: Diagnosis not present

## 2017-12-03 DIAGNOSIS — E2740 Unspecified adrenocortical insufficiency: Secondary | ICD-10-CM | POA: Diagnosis present

## 2017-12-03 DIAGNOSIS — K5903 Drug induced constipation: Secondary | ICD-10-CM | POA: Diagnosis not present

## 2017-12-03 DIAGNOSIS — I959 Hypotension, unspecified: Secondary | ICD-10-CM | POA: Diagnosis present

## 2017-12-03 DIAGNOSIS — Z87442 Personal history of urinary calculi: Secondary | ICD-10-CM

## 2017-12-03 DIAGNOSIS — Z9842 Cataract extraction status, left eye: Secondary | ICD-10-CM

## 2017-12-03 DIAGNOSIS — Z961 Presence of intraocular lens: Secondary | ICD-10-CM | POA: Diagnosis present

## 2017-12-03 DIAGNOSIS — M5417 Radiculopathy, lumbosacral region: Secondary | ICD-10-CM

## 2017-12-03 DIAGNOSIS — D72829 Elevated white blood cell count, unspecified: Secondary | ICD-10-CM | POA: Diagnosis not present

## 2017-12-03 DIAGNOSIS — I10 Essential (primary) hypertension: Secondary | ICD-10-CM | POA: Diagnosis present

## 2017-12-03 DIAGNOSIS — I25119 Atherosclerotic heart disease of native coronary artery with unspecified angina pectoris: Secondary | ICD-10-CM | POA: Diagnosis present

## 2017-12-03 DIAGNOSIS — Z9889 Other specified postprocedural states: Secondary | ICD-10-CM

## 2017-12-03 DIAGNOSIS — Z951 Presence of aortocoronary bypass graft: Secondary | ICD-10-CM

## 2017-12-03 DIAGNOSIS — Z981 Arthrodesis status: Secondary | ICD-10-CM

## 2017-12-03 DIAGNOSIS — K5900 Constipation, unspecified: Secondary | ICD-10-CM

## 2017-12-03 DIAGNOSIS — Z885 Allergy status to narcotic agent status: Secondary | ICD-10-CM

## 2017-12-03 DIAGNOSIS — Z87891 Personal history of nicotine dependence: Secondary | ICD-10-CM

## 2017-12-03 DIAGNOSIS — Z79899 Other long term (current) drug therapy: Secondary | ICD-10-CM

## 2017-12-03 DIAGNOSIS — Z96651 Presence of right artificial knee joint: Secondary | ICD-10-CM | POA: Diagnosis present

## 2017-12-03 DIAGNOSIS — R55 Syncope and collapse: Secondary | ICD-10-CM | POA: Diagnosis not present

## 2017-12-03 DIAGNOSIS — I771 Stricture of artery: Secondary | ICD-10-CM | POA: Diagnosis present

## 2017-12-03 DIAGNOSIS — N179 Acute kidney failure, unspecified: Secondary | ICD-10-CM | POA: Diagnosis not present

## 2017-12-03 DIAGNOSIS — M5440 Lumbago with sciatica, unspecified side: Secondary | ICD-10-CM | POA: Diagnosis present

## 2017-12-03 DIAGNOSIS — D500 Iron deficiency anemia secondary to blood loss (chronic): Secondary | ICD-10-CM | POA: Diagnosis present

## 2017-12-03 DIAGNOSIS — Z419 Encounter for procedure for purposes other than remedying health state, unspecified: Secondary | ICD-10-CM

## 2017-12-03 DIAGNOSIS — M5442 Lumbago with sciatica, left side: Secondary | ICD-10-CM

## 2017-12-03 DIAGNOSIS — G8929 Other chronic pain: Secondary | ICD-10-CM | POA: Diagnosis not present

## 2017-12-03 DIAGNOSIS — R000 Tachycardia, unspecified: Secondary | ICD-10-CM | POA: Diagnosis not present

## 2017-12-03 DIAGNOSIS — E785 Hyperlipidemia, unspecified: Secondary | ICD-10-CM | POA: Diagnosis present

## 2017-12-03 DIAGNOSIS — E119 Type 2 diabetes mellitus without complications: Secondary | ICD-10-CM

## 2017-12-03 DIAGNOSIS — Z8673 Personal history of transient ischemic attack (TIA), and cerebral infarction without residual deficits: Secondary | ICD-10-CM

## 2017-12-03 DIAGNOSIS — Z7902 Long term (current) use of antithrombotics/antiplatelets: Secondary | ICD-10-CM

## 2017-12-03 DIAGNOSIS — H9190 Unspecified hearing loss, unspecified ear: Secondary | ICD-10-CM | POA: Diagnosis present

## 2017-12-03 DIAGNOSIS — Z7982 Long term (current) use of aspirin: Secondary | ICD-10-CM

## 2017-12-03 DIAGNOSIS — Z5321 Procedure and treatment not carried out due to patient leaving prior to being seen by health care provider: Secondary | ICD-10-CM

## 2017-12-03 DIAGNOSIS — T380X5A Adverse effect of glucocorticoids and synthetic analogues, initial encounter: Secondary | ICD-10-CM | POA: Diagnosis present

## 2017-12-03 DIAGNOSIS — Z955 Presence of coronary angioplasty implant and graft: Secondary | ICD-10-CM

## 2017-12-03 DIAGNOSIS — E7800 Pure hypercholesterolemia, unspecified: Secondary | ICD-10-CM | POA: Diagnosis present

## 2017-12-03 DIAGNOSIS — I252 Old myocardial infarction: Secondary | ICD-10-CM

## 2017-12-03 DIAGNOSIS — M419 Scoliosis, unspecified: Secondary | ICD-10-CM | POA: Diagnosis present

## 2017-12-03 DIAGNOSIS — M549 Dorsalgia, unspecified: Secondary | ICD-10-CM

## 2017-12-03 DIAGNOSIS — R32 Unspecified urinary incontinence: Secondary | ICD-10-CM | POA: Diagnosis present

## 2017-12-03 DIAGNOSIS — R296 Repeated falls: Secondary | ICD-10-CM | POA: Diagnosis present

## 2017-12-03 DIAGNOSIS — I708 Atherosclerosis of other arteries: Secondary | ICD-10-CM | POA: Diagnosis present

## 2017-12-03 LAB — CBC WITH DIFFERENTIAL/PLATELET
BASOS PCT: 0 %
Basophils Absolute: 0.1 10*3/uL (ref 0.0–0.1)
EOS ABS: 0.1 10*3/uL (ref 0.0–0.7)
EOS PCT: 1 %
HCT: 36.2 % — ABNORMAL LOW (ref 39.0–52.0)
HEMOGLOBIN: 11.9 g/dL — AB (ref 13.0–17.0)
Lymphocytes Relative: 12 %
Lymphs Abs: 1.8 10*3/uL (ref 0.7–4.0)
MCH: 31.6 pg (ref 26.0–34.0)
MCHC: 32.9 g/dL (ref 30.0–36.0)
MCV: 96 fL (ref 78.0–100.0)
MONOS PCT: 9 %
Monocytes Absolute: 1.4 10*3/uL — ABNORMAL HIGH (ref 0.1–1.0)
NEUTROS PCT: 78 %
Neutro Abs: 11.9 10*3/uL — ABNORMAL HIGH (ref 1.7–7.7)
PLATELETS: 414 10*3/uL — AB (ref 150–400)
RBC: 3.77 MIL/uL — AB (ref 4.22–5.81)
RDW: 12.7 % (ref 11.5–15.5)
WBC: 15.2 10*3/uL — AB (ref 4.0–10.5)

## 2017-12-03 LAB — COMPREHENSIVE METABOLIC PANEL
ALBUMIN: 3.4 g/dL — AB (ref 3.5–5.0)
ALK PHOS: 165 U/L — AB (ref 38–126)
ALT: 16 U/L — ABNORMAL LOW (ref 17–63)
ANION GAP: 16 — AB (ref 5–15)
AST: 25 U/L (ref 15–41)
BUN: 24 mg/dL — ABNORMAL HIGH (ref 6–20)
CALCIUM: 9.1 mg/dL (ref 8.9–10.3)
CO2: 24 mmol/L (ref 22–32)
Chloride: 97 mmol/L — ABNORMAL LOW (ref 101–111)
Creatinine, Ser: 1.21 mg/dL (ref 0.61–1.24)
GFR calc Af Amer: >60 mL/min (ref >60)
GFR calc non Af Amer: 54 mL/min — ABNORMAL LOW (ref >60)
GLUCOSE: 137 mg/dL — AB (ref 65–99)
Potassium: 4.4 mmol/L (ref 3.5–5.1)
SODIUM: 137 mmol/L (ref 135–145)
Total Bilirubin: 0.5 mg/dL (ref 0.3–1.2)
Total Protein: 6.8 g/dL (ref 6.5–8.1)

## 2017-12-03 LAB — URINALYSIS, ROUTINE W REFLEX MICROSCOPIC
Bilirubin Urine: NEGATIVE
Glucose, UA: NEGATIVE mg/dL
HGB URINE DIPSTICK: NEGATIVE
Ketones, ur: NEGATIVE mg/dL
Leukocytes, UA: NEGATIVE
NITRITE: NEGATIVE
PROTEIN: NEGATIVE mg/dL
SPECIFIC GRAVITY, URINE: 1.04 — AB (ref 1.005–1.030)
pH: 5 (ref 5.0–8.0)

## 2017-12-03 LAB — I-STAT TROPONIN, ED: Troponin i, poc: 0.02 ng/mL (ref 0.00–0.08)

## 2017-12-03 LAB — CBG MONITORING, ED: Glucose-Capillary: 133 mg/dL — ABNORMAL HIGH (ref 65–99)

## 2017-12-03 LAB — TROPONIN I: Troponin I: <0.03 ng/mL (ref <0.03)

## 2017-12-03 LAB — I-STAT CG4 LACTIC ACID, ED
LACTIC ACID, VENOUS: 1.96 mmol/L — AB (ref 0.5–1.9)
Lactic Acid, Venous: 1.13 mmol/L (ref 0.5–1.9)

## 2017-12-03 MED ORDER — OXYCODONE HCL 5 MG PO TABS
10.0000 mg | ORAL_TABLET | ORAL | Status: DC | PRN
  Administered 2017-12-03 – 2017-12-15 (×36): 10 mg via ORAL
  Filled 2017-12-03 (×40): qty 2

## 2017-12-03 MED ORDER — IOPAMIDOL (ISOVUE-370) INJECTION 76%
100.0000 mL | Freq: Once | INTRAVENOUS | Status: AC | PRN
  Administered 2017-12-03: 100 mL via INTRAVENOUS

## 2017-12-03 MED ORDER — IOPAMIDOL (ISOVUE-370) INJECTION 76%
INTRAVENOUS | Status: AC
  Filled 2017-12-03: qty 100

## 2017-12-03 MED ORDER — FENTANYL CITRATE (PF) 100 MCG/2ML IJ SOLN
50.0000 ug | Freq: Once | INTRAMUSCULAR | Status: AC
  Administered 2017-12-03: 50 ug via INTRAVENOUS

## 2017-12-03 MED ORDER — FENTANYL CITRATE (PF) 100 MCG/2ML IJ SOLN
50.0000 ug | Freq: Once | INTRAMUSCULAR | Status: AC
Start: 2017-12-03 — End: 2017-12-03
  Administered 2017-12-03: 50 ug via INTRAVENOUS
  Filled 2017-12-03: qty 2

## 2017-12-03 MED ORDER — FENTANYL CITRATE (PF) 100 MCG/2ML IJ SOLN
50.0000 ug | Freq: Once | INTRAMUSCULAR | Status: AC
  Administered 2017-12-03: 50 ug via INTRAVENOUS
  Filled 2017-12-03: qty 2

## 2017-12-03 MED ORDER — SODIUM CHLORIDE 0.9 % IV BOLUS
1000.0000 mL | Freq: Once | INTRAVENOUS | Status: AC
  Administered 2017-12-03: 1000 mL via INTRAVENOUS

## 2017-12-03 NOTE — ED Provider Notes (Signed)
[facility] EMERGENCY DEPARTMENT Provider Note   CSN: [id number] Arrival date & time: 12/03/17  1513     History   Chief Complaint Chief Complaint  Patient presents with  . Loss of Consciousness    HPI Jeffrey Nelson is a 83 y.o. male who presents today for evaluation after a syncopal event.  He was at [facility] and was sitting down when he got lightheaded, dizzy, and had full loss of consciousness with loss of bladder control and loss of tone.  He has a history of CABG, MI x4, stroke, type 2 diabetes.  He had a microdiscectomy on 10/13/17 and then continued to have pain.  He was admitted from the emergency room on 3/17 and had a repeat microdiscectomy on 3/18.  He reports that since then he has been in continuous pain.  He reportedly came off the narcotics about 1 week ago as they were causing him to have bladder problems.  He reports that he has had a couple episodes of diarrhea recently.  He denies any abdominal pain, chest pain, or shortness of breath.  He reports that his pain is going down from his back into his left leg.  His wife adds that he has been at home crying in pain most of the day.    HPI  Past Medical History:  Diagnosis Date  . Anginal pain ([place])    occ  . Arthritis    "joints" (10/09/2017)  . Chronic lower back pain   . Complication of anesthesia    extremely claustrophobic; "have to be put down for MRI; don't sit in back seat of car;, etc."  . Coronary artery disease   . High cholesterol    "get shots twice/month" (10/09/2017)  . History of blood transfusion 2001   "related to CABG"  . History of kidney stones   . HOH (hard of hearing)    wearing hearing aids  . Hypertension   . Myocardial infarction ([place]) 2000s-06/2017 X 4  . Stroke [facility] ~ 2015   "in my right eye; sight is coming back little by little" (10/09/2017)  . Type II diabetes mellitus [facility])     Patient Active Problem List   Diagnosis Date Noted  . Syncope  12/03/2017  . Dehydration 12/03/2017  . Chronic back pain 12/03/2017  . Hypotension 12/03/2017  . Subclavian artery stenosis, right ([place]) 12/03/2017  . Status post lumbar laminectomy 11/12/2017  . Postoperative pain after spinal surgery 11/11/2017  . Weakness of right lower extremity   . Sciatica associated with disorder of lumbosacral spine   . Surgery, elective   . Diabetes mellitus type 2, uncomplicated ([place]) 19/62/2297    Class: Chronic  . Lumbar disc herniation with radiculopathy 10/13/2017    Class: Acute  . Radiculopathy, lumbar region 10/09/2017  . Pain in right hip 10/09/2017    Class: Acute  . Right knee sprain 10/09/2017    Class: Acute  . Hip pain, acute, right 10/09/2017  . Bradycardia 10/09/2017  . Spondylolisthesis, lumbar region 10/05/2017  . Knee contusion 07/24/2016  . Spinal stenosis in cervical region 05/14/2015    Class: Chronic  . Cubital tunnel syndrome on left 05/14/2015    Class: Chronic  . Stenosis of cervical spine with myelopathy ([place]) 05/14/2015  . Spinal stenosis, lumbar region, with neurogenic claudication 01/16/2014    Class: Chronic    Past Surgical History:  Procedure Laterality Date  . ANTERIOR CERVICAL DECOMP/DISCECTOMY FUSION N/A 05/14/2015   Procedure: C3-4  ANTERIOR CERVICAL DISCECTOMY AND FUSION WITH PLATE AND SCREWS, LOCAL AND ALLOGRAFT BONE GRAFT;  Surgeon: Jessy Oto, MD;  Location: [facility];  Service: Orthopedics;  Laterality: N/A;  . BACK SURGERY    . CARDIAC CATHETERIZATION     X 17 (10/09/2017)  . CATARACT EXTRACTION W/ INTRAOCULAR LENS  IMPLANT, BILATERAL Bilateral   . COLONOSCOPY    . CORONARY ANGIOPLASTY  06/2017  . CORONARY ANGIOPLASTY WITH STENT PLACEMENT     "6 stents total" (10/09/2017)  . CORONARY ARTERY BYPASS GRAFT  2001   CABG X4  . CYSTOSCOPY W/ STONE MANIPULATION    . JOINT REPLACEMENT    . LUMBAR LAMINECTOMY Right 10/13/2017   Procedure: MICRODISCECTOMY LUMBAR LAMINECTOMY RIGHT L3-4;  Surgeon: Jessy Oto,  MD;  Location: [place];  Service: Orthopedics;  Laterality: Right;  . LUMBAR LAMINECTOMY Left 11/12/2017   Procedure: MICRODISCECTOMY L3-4 FOR RECURRENT HNP POSSIBLE FLURO;  Surgeon: Jessy Oto, MD;  Location: [facility];  Service: Orthopedics;  Laterality: Left;  . LUMBAR LAMINECTOMY/DECOMPRESSION MICRODISCECTOMY N/A 01/16/2014   Procedure: Left L3-4 and L4-5 foraminotomies;  Surgeon: Jessy Oto, MD;  Location: [place];  Service: Orthopedics;  Laterality: N/A;  . NASAL FRACTURE SURGERY    . NASAL SINUS SURGERY     "several times since 1959"  . RADIOLOGY WITH ANESTHESIA N/A 10/12/2017   Procedure: MRI WITH ANESTHESIA;  Surgeon: Radiologist, Medication, MD;  Location: [place];  Service: Radiology;  Laterality: N/A;  . SHOULDER OPEN ROTATOR CUFF REPAIR Bilateral   . TOTAL KNEE ARTHROPLASTY Right 2012  . TYMPANOSTOMY TUBE PLACEMENT Bilateral   . ULNAR NERVE TRANSPOSITION Left 05/14/2015   Procedure: LEFT ULNAR NERVE DECOMPRESSION AT THE ELBOW;  Surgeon: Jessy Oto, MD;  Location: [place];  Service: Orthopedics;  Laterality: Left;        Home Medications    Prior to Admission medications   Medication Sig Start Date End Date Taking? Authorizing Provider  amLODipine (NORVASC) 10 MG tablet 10 mg daily.  10/14/17  Yes [provider]  fluticasone (FLONASE) 50 MCG/ACT nasal spray Place 2 sprays into both nostrils as needed for allergies or rhinitis.   Yes [provider]  meloxicam (MOBIC) 7.5 MG tablet Take 7.5 mg by mouth daily. 11/28/17  Yes [provider]  metoprolol tartrate (LOPRESSOR) 50 MG tablet Take 50 mg by mouth 2 (two) times daily. 09/20/17  Yes [provider]  oxyCODONE-acetaminophen (PERCOCET/ROXICET) 5-325 MG tablet Take 1 tablet by mouth every 6 (six) hours as needed for severe pain. 11/13/17  Yes Lanae Crumbly, PA-C  aspirin EC 81 MG tablet Take 81 mg by mouth daily.    [provider]  clopidogrel (PLAVIX) 75 MG tablet Take 75 mg by mouth  daily.    [provider]  Coenzyme Q10 (COQ10 PO) Take 1 tablet by mouth daily.    [provider]  Cranberry 500 MG TABS Take 500 mg by mouth daily.    [provider]  docusate sodium (COLACE) 100 MG capsule Take 200 mg by mouth 2 (two) times daily as needed for mild constipation.    [provider]  Evolocumab (REPATHA) 140 MG/ML SOSY Inject 140 mg into the skin every 14 (fourteen) days.    [provider]  gabapentin (NEURONTIN) 300 MG capsule Take 1 capsule (300 mg total) by mouth at bedtime for 7 days, THEN 1 capsule (300 mg total) 2 (two) times daily for 28 days. Patient taking differently: 1  capsule (372m) twice daily 10/31/17 12/05/17  NJessy Oto MD  isosorbide mononitrate (IMDUR) 30 MG 24 hr tablet Take 30 mg by mouth daily. 09/05/17   [provider]  lisinopril (PRINIVIL,ZESTRIL) 5 MG tablet Take 5 mg by mouth daily.    [provider]  Multiple Vitamin (MULTIVITAMIN WITH MINERALS) TABS tablet Take 1 tablet by mouth daily.    [provider]  oxyCODONE (OXYCONTIN) 20 mg 12 hr tablet Take 1 tablet (20 mg total) by mouth every 12 (twelve) hours. 11/22/17   NJessy Oto MD    Family History Family History  Problem Relation Age of Onset  . CAD Brother   . Diabetes Neg Hx     Social History Social History   Tobacco Use  . Smoking status: Former Smoker    Packs/day: 1.00    Years: 2.00    Pack years: 2.00    Last attempt to quit: 01/09/1957    Years since quitting: 60.9  . Smokeless tobacco: Never Used  Substance Use Topics  . Alcohol use: No  . Drug use: No     Allergies   Ezetimibe; Morphine and related; and Statins   Review of Systems Review of Systems  Constitutional: Negative for chills, fatigue and fever.  Respiratory: Negative for cough, chest tightness, shortness of breath and wheezing.   Cardiovascular: Negative for chest pain and palpitations.  Gastrointestinal: Positive for  diarrhea. Negative for abdominal distention, abdominal pain, constipation, nausea and vomiting.  Genitourinary: Negative for difficulty urinating, dysuria, frequency, hematuria and urgency.  Musculoskeletal: Positive for back pain. Negative for neck pain and neck stiffness.  Skin: Negative for color change, rash and wound.  Neurological: Positive for syncope (with loss of bladder control and loss of tone). Negative for weakness, numbness and headaches.  Psychiatric/Behavioral: Negative for confusion.  All other systems reviewed and are negative.    Physical Exam Updated Vital Signs BP (!) 126/59   Pulse 76   Temp 98 F (36.7 C) (Oral)   Resp 17   Ht '6\' 2"'  (1.88 m)   Wt 92.5 kg (204 lb)   SpO2 99%   BMI 26.19 kg/m   Physical Exam  Constitutional: He is oriented to person, place, and time. He appears well-developed and well-nourished.  HENT:  Head: Normocephalic and atraumatic.  Mouth/Throat: Oropharynx is clear and moist.  Eyes: Conjunctivae are normal.  Neck: Normal range of motion. Neck supple.  Cardiovascular: Normal rate, regular rhythm, normal heart sounds and intact distal pulses.  No murmur heard. Pulmonary/Chest: Effort normal and breath sounds normal. No respiratory distress. He exhibits no tenderness.  Abdominal: Soft. Bowel sounds are normal. He exhibits no distension. There is no tenderness. There is no guarding.  Musculoskeletal: He exhibits no edema or deformity.  Symmetrical bilateral lower extremity weakness, patient is only able to lift bilateral lower extremities 1-2 inches off the bed.  4/5 strength with bilateral plantar and dorsiflexion of ankles.  Lymphadenopathy:    He has no cervical adenopathy.  Neurological: He is alert and oriented to person, place, and time.  Skin: Skin is warm and dry.  Psychiatric: He has a normal mood and affect. His behavior is normal.  Nursing note and vitals reviewed.    ED Treatments / Results  Labs (all labs ordered  are listed, but only abnormal results are displayed) Labs Reviewed  CBC WITH DIFFERENTIAL/PLATELET - Abnormal; Notable for the following components:      Result Value   WBC 15.2 (*)  RBC 3.77 (*)    Hemoglobin 11.9 (*)    HCT 36.2 (*)    Platelets 414 (*)    Neutro Abs 11.9 (*)    Monocytes Absolute 1.4 (*)    All other components within normal limits  COMPREHENSIVE METABOLIC PANEL - Abnormal; Notable for the following components:   Chloride 97 (*)    Glucose, Bld 137 (*)    BUN 24 (*)    Albumin 3.4 (*)    ALT 16 (*)    Alkaline Phosphatase 165 (*)    GFR calc non Af Amer 54 (*)    Anion gap 16 (*)    All other components within normal limits  URINALYSIS, ROUTINE W REFLEX MICROSCOPIC - Abnormal; Notable for the following components:   Specific Gravity, Urine 1.040 (*)    All other components within normal limits  CBG MONITORING, ED - Abnormal; Notable for the following components:   Glucose-Capillary 133 (*)    All other components within normal limits  I-STAT CG4 LACTIC ACID, ED - Abnormal; Notable for the following components:   Lactic Acid, Venous 1.96 (*)    All other components within normal limits  CULTURE, BLOOD (ROUTINE X 2)  CULTURE, BLOOD (ROUTINE X 2)  TROPONIN I  TROPONIN I  TROPONIN I  I-STAT TROPONIN, ED  I-STAT CG4 LACTIC ACID, ED  I-STAT TROPONIN, ED    EKG EKG Interpretation  Date/Time:  Monday December 03 2017 15:56:00 EDT Ventricular Rate:  77 PR Interval:    QRS Duration: 101 QT Interval:  378 QTC Calculation: 428 R Axis:   29 Text Interpretation:  Sinus rhythm Short PR interval RSR' in V1 or V2, right VCD or RVH Confirmed by Quintella Reichert [phone]) on 12/03/2017 4:33:50 PM   Radiology Ct Angio Chest/abd/pel For Dissection W And/or W/wo  Result Date: 12/03/2017 CLINICAL DATA:  Back pain. EXAM: CT ANGIOGRAPHY CHEST, ABDOMEN AND PELVIS TECHNIQUE: Multidetector CT imaging through the chest, abdomen and pelvis was performed using the standard  protocol during bolus administration of intravenous contrast. Multiplanar reconstructed images and MIPs were obtained and reviewed to evaluate the vascular anatomy. CONTRAST:  123m ISOVUE-370 IOPAMIDOL (ISOVUE-370) INJECTION 76% COMPARISON:  CT scan of April 02, 2008. FINDINGS: CTA CHEST FINDINGS Cardiovascular: Atherosclerosis of thoracic aorta is noted without aneurysm or dissection. Status post coronary artery bypass graft. No pericardial effusion is noted. Severe stenosis is noted at the origin of the right subclavian artery. Otherwise great vessels are widely patent. Mediastinum/Nodes: No enlarged mediastinal, hilar, or axillary lymph nodes. Thyroid gland, trachea, and esophagus demonstrate no significant findings. Lungs/Pleura: Lungs are clear. No pleural effusion or pneumothorax. Musculoskeletal: No chest wall abnormality. No acute or significant osseous findings. Review of the MIP images confirms the above findings. CTA ABDOMEN AND PELVIS FINDINGS VASCULAR Aorta: Atherosclerosis of abdominal aorta is noted without aneurysm or dissection. Celiac: Patent without evidence of aneurysm, dissection, vasculitis or significant stenosis. SMA: Patent without evidence of aneurysm, dissection, vasculitis or significant stenosis. Renals: Both renal arteries are patent without evidence of aneurysm, dissection, vasculitis, fibromuscular dysplasia or significant stenosis. IMA: Patent without evidence of aneurysm, dissection, vasculitis or significant stenosis. Inflow: Patent without evidence of aneurysm, dissection, vasculitis or significant stenosis. Veins: No obvious venous abnormality within the limitations of this arterial phase study. Review of the MIP images confirms the above findings. NON-VASCULAR Hepatobiliary: No focal liver abnormality is seen. No gallstones, gallbladder wall thickening, or biliary dilatation. Pancreas: Unremarkable. No pancreatic ductal dilatation or surrounding inflammatory changes. Spleen:  Normal in size without focal abnormality. Adrenals/Urinary Tract: Adrenal glands are unremarkable. Kidneys are normal, without renal calculi, focal lesion, or hydronephrosis. Bladder is unremarkable. Stomach/Bowel: Stomach is within normal limits. Appendix appears normal. No evidence of bowel wall thickening, distention, or inflammatory changes. Lymphatic: No significant adenopathy is noted. Reproductive: Prostate is unremarkable. Other: No abdominal wall hernia or abnormality. No abdominopelvic ascites. Musculoskeletal: No acute or significant osseous findings. Review of the MIP images confirms the above findings. IMPRESSION: No evidence of thoracic or abdominal aortic dissection or aneurysm. Severe stenosis is noted at the origin of the right subclavian artery from the right innominate artery. No acute abnormality seen in the abdomen or pelvis. Electronically Signed   By: Marijo Conception, M.D.   On: 12/03/2017 19:31    Procedures Procedures  CRITICAL CARE Performed by: Wyn Quaker Total critical care time: 39 minutes Critical care time was exclusive of separately billable procedures and treating other patients. Critical care was necessary to treat or prevent imminent or life-threatening deterioration. Critical care was time spent personally by me on the following activities: development of treatment plan with patient and/or surrogate as well as nursing, discussions with consultants, evaluation of patient's response to treatment, examination of patient, obtaining history from patient or surrogate, ordering and performing treatments and interventions, ordering and review of laboratory studies, ordering and review of radiographic studies, pulse oximetry and re-evaluation of patient's condition.  Hypotension requiring multiple fluid boluses, admission, orthostatic with BP into 67H systolic.     Medications Ordered in ED Medications  iopamidol (ISOVUE-370) 76 % injection (has no administration in  time range)  fentaNYL (SUBLIMAZE) injection 50 mcg (50 mcg Intravenous Given 12/03/17 1749)  sodium chloride 0.9 % bolus 1,000 mL (0 mLs Intravenous Stopped 12/03/17 1934)  sodium chloride 0.9 % bolus 1,000 mL (0 mLs Intravenous Stopped 12/03/17 2027)  fentaNYL (SUBLIMAZE) injection 50 mcg (50 mcg Intravenous Given 12/03/17 1757)  iopamidol (ISOVUE-370) 76 % injection 100 mL (100 mLs Intravenous Contrast Given 12/03/17 1846)  fentaNYL (SUBLIMAZE) injection 50 mcg (50 mcg Intravenous Given 12/03/17 1941)     Initial Impression / Assessment and Plan / ED Course  I have reviewed the triage vital signs and the nursing notes.  Pertinent labs & imaging results that were available during my care of the patient were reviewed by me and considered in my medical decision making (see chart for details).  Clinical Course as of Dec 03 2224  Mon Dec 03, 2017  1700 Informed PT blood pressure remains low, unable to give pain medications safely at this time.    [EH]  1820 RN reports patient family asked for him to be checked for prostate cancer, will not evaluate for this today beyond obtaining scan for dissection.    [EH]  1832 Called CT  to ask them to bump his scan up.    [EH]  2050 Spoke with Dr. Roel Cluck who will come see patient for admission.    [EH]    Clinical Course User Index [EH] Lorin Glass, PA-C    Patient presents today for evaluation of back pain and syncope.  He was at his orthopedist's office when he had a syncopal event with loss of tone and loss of urinary function.  Labs were obtained, mild leukocytosis at 15.2, hemoglobin 11.9.  CMP significant for alk phos 165, mild reduction of GFR at 54.  Initial troponin 0 0.02.  Lactic acid 1.93.  While patient was in the emergency room he needed multiple doses  of fentanyl for his pain, unable to give him his requested dilaudid secondary to hypotension.  He was not tachycardic, was afebrile.  Urine with out evidence of infection.  Based on  hypotension and syncopal event a CT dissection study was ordered, as this would show both any large proximal PEs or a dissection.  CT dissection study without PE, aortic dissection, consolidation, or other cause for his symptoms.  CT did show that his right subclavian artery is partially occluded which may be producing false low blood pressures on that side.  EKG obtained and reviewed.      I called Ortho Dr. Durward Fortes on call for Dr. Louanne Skye who reports that he will have Dr. Louanne Skye see the patient in the morning for evaluation.    Patient will be admitted by hospitalist.    Final Clinical Impressions(s) / ED Diagnoses   Final diagnoses:  Syncope, unspecified syncope type  Chronic bilateral low back pain with left-sided sciatica    ED Discharge Orders    None       Ollen Gross 12/03/17 2227    Quintella Reichert, MD 12/03/17 2304

## 2017-12-03 NOTE — ED Triage Notes (Signed)
Patient presents from [facility]. Reports syncopal episode. Surgery 2 months ago on leg. Reports being hypotensive post surgery. Recent complaints of diarrhea. Endorses low back pain.

## 2017-12-03 NOTE — ED Notes (Signed)
Hospitalist at bedside 

## 2017-12-03 NOTE — ED Notes (Signed)
EDP at bedside. Jeffrey Nelson

## 2017-12-03 NOTE — H&P (Signed)
Jeffrey Nelson:[id number] DOB: Jul 17, 1935 DOA: 12/03/2017     PCP: Jeffrey Nelson, No Pcp Per   Outpatient Specialists:  Orthopedics Dr. Otelia Sergeant [facility] [place] CARDS:    Dr.[place]   Jeffrey Nelson arrived to ER on 12/03/17 at 1513  Jeffrey Nelson coming from:    home Lives   With family   Chief Complaint:  Chief Complaint  Jeffrey Nelson presents with  . Loss of Consciousness    HPI: Jeffrey Nelson is a 83 y.o. male with medical history significant of severe back pain, CAD, DM 2, HTN    Presented with syncopal event while at orthopedics office. Reports he was sitting on the chair and was not feeling well. Next thing he knew he passed out.  Jeffrey Nelson has history of severe back pain status post microdiscectomy's done in February as well and March he has been taking narcotics after his procedures with some control of his pain but developed severe constipation urinary retention and hesitancy he was seen by urology on 3 April and started on Flomax was recommended to get off of his narcotics and try to use meloxicam instead.  After coming off of narcotics he developed some diarrhea no associated abdominal pain no chest pain or shortness of breath he continues to have significant pain from his back down to his legs which is uncontrolled today Jeffrey Nelson was seen in the office by orthopedics Dr. Otelia Sergeant and develop syncopal event associated with collapse and loss of bladder tone was noted to be severely hyortensive. Reports he has fallen 4 times at home due to his back pain and legs giving out.   Orthostatic with systolic blood pressures going down to 70s when the Jeffrey Nelson tried to stand up. History of coronary artery disease status post CABG and MI.  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  Hx of diabetes not on any medications   While in ER: Jeffrey Nelson remains somewhat hypotensive but requesting IV pain medications  Following Medications were ordered  in ER: Medications  iopamidol (ISOVUE-370) 76 % injection (has no administration in time range)  fentaNYL (SUBLIMAZE) injection 50 mcg (50 mcg Intravenous Given 12/03/17 1749)  sodium chloride 0.9 % bolus 1,000 mL (0 mLs Intravenous Stopped 12/03/17 1934)  sodium chloride 0.9 % bolus 1,000 mL (0 mLs Intravenous Stopped 12/03/17 2027)  fentaNYL (SUBLIMAZE) injection 50 mcg (50 mcg Intravenous Given 12/03/17 1757)  iopamidol (ISOVUE-370) 76 % injection 100 mL (100 mLs Intravenous Contrast Given 12/03/17 1846)  fentaNYL (SUBLIMAZE) injection 50 mcg (50 mcg Intravenous Given 12/03/17 1941)    Significant initial  Findings: Abnormal Labs Reviewed  CBC WITH DIFFERENTIAL/PLATELET - Abnormal; Notable for the following components:      Result Value   WBC 15.2 (*)    RBC 3.77 (*)    Hemoglobin 11.9 (*)    HCT 36.2 (*)    Platelets 414 (*)    Neutro Abs 11.9 (*)    Monocytes Absolute 1.4 (*)    All other components within normal limits  COMPREHENSIVE METABOLIC PANEL - Abnormal; Notable for the following components:   Chloride 97 (*)    Glucose, Bld 137 (*)    BUN 24 (*)    Albumin 3.4 (*)    ALT 16 (*)    Alkaline Phosphatase 165 (*)    GFR calc non Af Amer 54 (*)    Anion gap 16 (*)    All  other components within normal limits  URINALYSIS, ROUTINE W REFLEX MICROSCOPIC - Abnormal; Notable for the following components:   Specific Gravity, Urine 1.040 (*)    All other components within normal limits  CBG MONITORING, ED - Abnormal; Notable for the following components:   Glucose-Capillary 133 (*)    All other components within normal limits  I-STAT CG4 LACTIC ACID, ED - Abnormal; Notable for the following components:   Lactic Acid, Venous 1.96 (*)    All other components within normal limits     Na 137 K 4.4  Cr  Up from baseline see below Lab Results  Component Value Date   CREATININE 1.21 12/03/2017   CREATININE 0.96 11/13/2017   CREATININE 0.78 11/11/2017      WBC 15.2  HG/HCT  stable,       Component Value Date/Time   HGB 11.9 (L) 12/03/2017 1533   HCT 36.2 (L) 12/03/2017 1533       Troponin (Point of Care Test) Recent Labs    12/03/17 1631  TROPIPOC 0.02    Lactic Acid, Venous    Component Value Date/Time   LATICACIDVEN 1.13 12/03/2017 1955     UA  no evidence of UTI      CTcxr/abd/pelvis - no bicycle abdominal aortic dissection or aneurysm of note there is severe stenosis of the origin of right subclavian artery from the right innominate artery  ECG:  Personally reviewed by me showing: HR : 77 Rhythm: NSR    no evidence of ischemic changes [id number]     ED Triage Vitals  Enc Vitals Group     BP 12/03/17 1535 (!) 96/56     Pulse Rate 12/03/17 1535 66     Resp 12/03/17 1535 18     Temp 12/03/17 1535 98 F (36.7 C)     Temp Source 12/03/17 1535 Oral     SpO2 12/03/17 1529 99 %     Weight 12/03/17 1531 204 lb (92.5 kg)     Height 12/03/17 1531 6\' 2"  (1.88 m)     Head Circumference --      Peak Flow --      Pain Score 12/03/17 1530 5     Pain Loc --      Pain Edu? --      Excl. in GC? --   TMAX(24)@       Latest  Blood pressure (!) 107/58, pulse 75, temperature 98 F (36.7 C), temperature source Oral, resp. rate 15, height 6\' 2"  (1.88 m), weight 92.5 kg (204 lb), SpO2 99 %.    ER Provider Called:     Dr.Nitka with orthopedics They Recommend medical admission for evaluation of hypotension and syncope Will see in AM   Hospitalist was called for admission for hypotension and syncope   Review of Systems:    Pertinent positives include: syncope, back pain, diarrhea,  straining to urinate.  Constitutional:  No weight loss, night sweats, Fevers, chills, fatigue, weight loss  HEENT:  No headaches, Difficulty swallowing,Tooth/dental problems,Sore throat,  No sneezing, itching, ear ache, nasal congestion, post nasal drip,  Cardio-vascular:  No chest pain, Orthopnea, PND, anasarca, dizziness, palpitations.no Bilateral lower  extremity swelling  GI:  No heartburn, indigestion, abdominal pain, nausea, vomiting, change in bowel habits, loss of appetite, melena, blood in stool, hematemesis Resp:  no shortness of breath at rest. No dyspnea on exertion, No excess mucus, no productive cough, No non-productive cough, No coughing up of blood.No change in color of mucus.No wheezing.  Skin:  no rash or lesions. No jaundice GU:  no dysuria, change in color of urine, no urgency or frequency. No  No flank pain.  Musculoskeletal:  No joint pain or no joint swelling. No decreased range of motion.  Psych:  No change in mood or affect. No depression or anxiety. No memory loss.  Neuro: no localizing neurological complaints, no tingling, no weakness, no double vision, no gait abnormality, no slurred speech, no confusion  As per HPI otherwise 10 point review of systems negative.   Past Medical History:   Past Medical History:  Diagnosis Date  . Anginal pain (HCC)    occ  . Arthritis    "joints" (10/09/2017)  . Chronic lower back pain   . Complication of anesthesia    extremely claustrophobic; "have to be put down for MRI; don't sit in back seat of car;, etc."  . Coronary artery disease   . High cholesterol    "get shots twice/month" (10/09/2017)  . History of blood transfusion 2001   "related to CABG"  . History of kidney stones   . HOH (hard of hearing)    wearing hearing aids  . Hypertension   . Myocardial infarction (HCC) 2000s-06/2017 X 4  . Stroke [facility](HCC) ~ 2015   "in my right eye; sight is coming back little by little" (10/09/2017)  . Type II diabetes mellitus (HCC)       Past Surgical History:  Procedure Laterality Date  . ANTERIOR CERVICAL DECOMP/DISCECTOMY FUSION N/A 05/14/2015   Procedure: C3-4  ANTERIOR CERVICAL DISCECTOMY AND FUSION WITH PLATE AND SCREWS, LOCAL AND ALLOGRAFT BONE GRAFT;  Surgeon: Kerrin ChampagneJames E Nitka, MD;  Location: [facility] OR;  Service: Orthopedics;  Laterality: N/A;  . BACK SURGERY    . CARDIAC  CATHETERIZATION     X 17 (10/09/2017)  . CATARACT EXTRACTION W/ INTRAOCULAR LENS  IMPLANT, BILATERAL Bilateral   . COLONOSCOPY    . CORONARY ANGIOPLASTY  06/2017  . CORONARY ANGIOPLASTY WITH STENT PLACEMENT     "6 stents total" (10/09/2017)  . CORONARY ARTERY BYPASS GRAFT  2001   CABG X4  . CYSTOSCOPY W/ STONE MANIPULATION    . JOINT REPLACEMENT    . LUMBAR LAMINECTOMY Right 10/13/2017   Procedure: MICRODISCECTOMY LUMBAR LAMINECTOMY RIGHT L3-4;  Surgeon: Kerrin ChampagneNitka, James E, MD;  [facility];  Service: Orthopedics;  Laterality: Right;  . LUMBAR LAMINECTOMY Left 11/12/2017   Procedure: MICRODISCECTOMY L3-4 FOR RECURRENT HNP POSSIBLE FLURO;  Surgeon: Kerrin ChampagneNitka, James E, MD;  [facility];  Service: Orthopedics;  Laterality: Left;  . LUMBAR LAMINECTOMY/DECOMPRESSION MICRODISCECTOMY N/A 01/16/2014   Procedure: Left L3-4 and L4-5 foraminotomies;  Surgeon: Kerrin ChampagneJames E Nitka, MD;  [facility];  Service: Orthopedics;  Laterality: N/A;  . NASAL FRACTURE SURGERY    . NASAL SINUS SURGERY     "several times since 1959"  . RADIOLOGY WITH ANESTHESIA N/A 10/12/2017   Procedure: MRI WITH ANESTHESIA;  Surgeon: Radiologist, Medication, MD;  Location: [facility] OR;  Service: Radiology;  Laterality: N/A;  . SHOULDER OPEN ROTATOR CUFF REPAIR Bilateral   . TOTAL KNEE ARTHROPLASTY Right 2012  . TYMPANOSTOMY TUBE PLACEMENT Bilateral   . ULNAR NERVE TRANSPOSITION Left 05/14/2015   Procedure: LEFT ULNAR NERVE DECOMPRESSION AT THE ELBOW;  Surgeon: Kerrin ChampagneJames E Nitka, MD;  [facility];  Service: Orthopedics;  Laterality: Left;    Social History:  Ambulatory    Independently     reports that he quit smoking about 60 years ago. He has a  2.00 pack-year smoking history. He has never used smokeless tobacco. He reports that he does not drink alcohol or use drugs.     Family History:   Family History  Problem Relation Age of Onset  . CAD Brother   . Diabetes Neg Hx     Allergies: Allergies  Allergen Reactions  .  Ezetimibe Other (See Comments)    Cramps  . Morphine And Related Other (See Comments)    Confusion  . Statins Other (See Comments)    Cramps     Prior to Admission medications   Medication Sig Start Date End Date Taking? Authorizing Provider  amLODipine (NORVASC) 10 MG tablet 10 mg daily.  10/14/17  Yes [provider]  fluticasone (FLONASE) 50 MCG/ACT nasal spray Place 2 sprays into both nostrils as needed for allergies or rhinitis.   Yes [provider]  meloxicam (MOBIC) 7.5 MG tablet Take 7.5 mg by mouth daily. 11/28/17  Yes [provider]  metoprolol tartrate (LOPRESSOR) 50 MG tablet Take 50 mg by mouth 2 (two) times daily. 09/20/17  Yes [provider]  oxyCODONE-acetaminophen (PERCOCET/ROXICET) 5-325 MG tablet Take 1 tablet by mouth every 6 (six) hours as needed for severe pain. 11/13/17  Yes Naida Sleight, PA-C  aspirin EC 81 MG tablet Take 81 mg by mouth daily.    [provider]  clopidogrel (PLAVIX) 75 MG tablet Take 75 mg by mouth daily.    [provider]  Coenzyme Q10 (COQ10 PO) Take 1 tablet by mouth daily.    [provider]  Cranberry 500 MG TABS Take 500 mg by mouth daily.    [provider]  docusate sodium (COLACE) 100 MG capsule Take 200 mg by mouth 2 (two) times daily as needed for mild constipation.    [provider]  Evolocumab (REPATHA) 140 MG/ML SOSY Inject 140 mg into the skin every 14 (fourteen) days.    [provider]  gabapentin (NEURONTIN) 300 MG capsule Take 1 capsule (300 mg total) by mouth at bedtime for 7 days, THEN 1 capsule (300 mg total) 2 (two) times daily for 28 days. Jeffrey Nelson taking differently: 1 capsule (300mg ) twice daily 10/31/17 12/05/17  Kerrin Champagne, MD  isosorbide mononitrate (IMDUR) 30 MG 24 hr tablet Take 30 mg by mouth daily. 09/05/17   [provider]  lisinopril (PRINIVIL,ZESTRIL) 5 MG tablet Take 5 mg by mouth daily.    [provider]    Multiple Vitamin (MULTIVITAMIN WITH MINERALS) TABS tablet Take 1 tablet by mouth daily.    [provider]  oxyCODONE (OXYCONTIN) 20 mg 12 hr tablet Take 1 tablet (20 mg total) by mouth every 12 (twelve) hours. 11/22/17   Kerrin Champagne, MD   Physical Exam: Blood pressure (!) 107/58, pulse 75, temperature 98 F (36.7 C), temperature source Oral, resp. rate 15, height 6\' 2"  (1.88 m), weight 92.5 kg (204 lb), SpO2 99 %. 1. General:  in No Acute distress   Chronically ill  -appearing 2. Psychological: Alert and  Oriented 3. Head/ENT:   Dry Mucous Membranes                          Head Non traumatic, neck supple                         Poor Dentition 4. SKIN:  decreased Skin turgor,  Skin clean Dry and intact no rash  5. Heart: Regular rate and rhythm no  Murmur, no Rub or gallop 6. Lungs:  no wheezes or crackles   7. Abdomen: Soft,  non-tender, Non distended  Obese bowel sounds present 8. Lower extremities: no clubbing, cyanosis, or edema 9. Neurologically Grossly intact, moving all 4 extremities right leg limited due to knee replacement.   10. MSK: Normal range of motion   LABS:     Recent Labs  Lab 12/03/17 1533  WBC 15.2*  NEUTROABS 11.9*  HGB 11.9*  HCT 36.2*  MCV 96.0  PLT 414*   Basic Metabolic Panel: Recent Labs  Lab 12/03/17 1533  NA 137  K 4.4  CL 97*  CO2 24  GLUCOSE 137*  BUN 24*  CREATININE 1.21  CALCIUM 9.1      Recent Labs  Lab 12/03/17 1533  AST 25  ALT 16*  ALKPHOS 165*  BILITOT 0.5  PROT 6.8  ALBUMIN 3.4*   No results for input(s): LIPASE, AMYLASE in the last 168 hours. No results for input(s): AMMONIA in the last 168 hours.    HbA1C: No results for input(s): HGBA1C in the last 72 hours. CBG: Recent Labs  Lab 12/03/17 1604  GLUCAP 133*      Urine analysis:    Component Value Date/Time   COLORURINE YELLOW 12/03/2017 1937   APPEARANCEUR CLEAR 12/03/2017 1937   LABSPEC 1.040 (H) 12/03/2017 1937   PHURINE 5.0  12/03/2017 1937   GLUCOSEU NEGATIVE 12/03/2017 1937   HGBUR NEGATIVE 12/03/2017 1937   BILIRUBINUR NEGATIVE 12/03/2017 1937   KETONESUR NEGATIVE 12/03/2017 1937   PROTEINUR NEGATIVE 12/03/2017 1937   UROBILINOGEN 0.2 05/07/2015 1345   NITRITE NEGATIVE 12/03/2017 1937   LEUKOCYTESUR NEGATIVE 12/03/2017 1937      Cultures:    Component Value Date/Time   SDES TISSUE 11/12/2017 1907   SPECREQUEST DISC MATERIAL LUMBAR 3 AND 4 11/12/2017 1907   CULT  11/12/2017 1907    No growth aerobically or anaerobically. Performed at [facility], [address], [place], [place] [address]    REPTSTATUS 11/18/2017 FINAL 11/12/2017 1907     Radiological Exams on Admission: Ct Angio Chest/abd/pel For Dissection W And/or W/wo  Result Date: 12/03/2017 CLINICAL DATA:  Back pain. EXAM: CT ANGIOGRAPHY CHEST, ABDOMEN AND PELVIS TECHNIQUE: Multidetector CT imaging through the chest, abdomen and pelvis was performed using the standard protocol during bolus administration of intravenous contrast. Multiplanar reconstructed images and MIPs were obtained and reviewed to evaluate the vascular anatomy. CONTRAST:  ISOVUE-370 IOPAMIDOL (ISOVUE-370) INJECTION 76% COMPARISON:  CT scan of April 02, 2008. FINDINGS: CTA CHEST FINDINGS Cardiovascular: Atherosclerosis of thoracic aorta is noted without aneurysm or dissection. Status post coronary artery bypass graft. No pericardial effusion is noted. Severe stenosis is noted at the origin of the right subclavian artery. Otherwise great vessels are widely patent. Mediastinum/Nodes: No enlarged mediastinal, hilar, or axillary lymph nodes. Thyroid gland, trachea, and esophagus demonstrate no significant findings. Lungs/Pleura: Lungs are clear. No pleural effusion or pneumothorax. Musculoskeletal: No chest wall abnormality. No acute or significant osseous findings. Review of the MIP images confirms the above findings. CTA ABDOMEN AND PELVIS FINDINGS VASCULAR Aorta:  Atherosclerosis of abdominal aorta is noted without aneurysm or dissection. Celiac: Patent without evidence of aneurysm, dissection, vasculitis or significant stenosis. SMA: Patent without evidence of aneurysm, dissection, vasculitis or significant stenosis. Renals: Both renal arteries are patent without evidence of aneurysm, dissection, vasculitis, fibromuscular dysplasia or significant stenosis. IMA: Patent without evidence of aneurysm, dissection, vasculitis or significant  stenosis. Inflow: Patent without evidence of aneurysm, dissection, vasculitis or significant stenosis. Veins: No obvious venous abnormality within the limitations of this arterial phase study. Review of the MIP images confirms the above findings. NON-VASCULAR Hepatobiliary: No focal liver abnormality is seen. No gallstones, gallbladder wall thickening, or biliary dilatation. Pancreas: Unremarkable. No pancreatic ductal dilatation or surrounding inflammatory changes. Spleen: Normal in size without focal abnormality. Adrenals/Urinary Tract: Adrenal glands are unremarkable. Kidneys are normal, without renal calculi, focal lesion, or hydronephrosis. Bladder is unremarkable. Stomach/Bowel: Stomach is within normal limits. Appendix appears normal. No evidence of bowel wall thickening, distention, or inflammatory changes. Lymphatic: No significant adenopathy is noted. Reproductive: Prostate is unremarkable. Other: No abdominal wall hernia or abnormality. No abdominopelvic ascites. Musculoskeletal: No acute or significant osseous findings. Review of the MIP images confirms the above findings. IMPRESSION: No evidence of thoracic or abdominal aortic dissection or aneurysm. Severe stenosis is noted at the origin of the right subclavian artery from the right innominate artery. No acute abnormality seen in the abdomen or pelvis. Electronically Signed   By: Lupita Raider, M.D.   On: 12/03/2017 19:31    Chart has been  reviewed    Assessment/Plan  83 y.o. male with medical history significant of severe back pain, CAD, DM 2, HTN Admitted for syncope and dehydration resulting in hypotension  Present on Admission: . Syncope in the setting of history of CAD unclear etiology seem to have had some presyncopal prodrome.  Possibly vasovagal in the setting severe back pain.  Cycle cardiac enzymes obtain echogram monitor on telemetry check carotid Dopplers.  Given history of CAD will consult cardiology . Dehydration we will gently rehydrate . Chronic back pain -followed by orthopedics difficult to manage pain Jeffrey Nelson had urinary retention with narcotics and they were discontinued recently which resulted in some symptoms of withdrawal including likely diarrhea.  Appreciate orthopedics consult regarding significant lumbar disease.  For now will give as needed oxycodone. . Hypotension -unclear if inaccurate reading secondary to subclavian artery stenosis but also likely worse with decreased p.o. intake will hold home medications for now . Subclavian artery stenosis, right (HCC) -he is check blood pressures on the left.  When checked blood pressure in the right systolics in the 90s compared to on the left 124 possibly explaining hypotension dm 2 -  - Order Sensitive   SSI   - not on home medications  -  check TSH and HgA1C     Other plan as per orders.  DVT prophylaxis:  SCD   Code Status:  FULL CODE  as per Jeffrey Nelson    Family Communication:   Family not  at  Bedside    Disposition Plan:   may need placement for rehabilitation                                              Would benefit from PT/OT eval prior to DC   ordered                       Consults called: orthopedics email cardiology  Admission status:    obs   Level of care    tele            Therisa Doyne 12/03/2017, 10:13 PM    [facility]  Pager [phone]   after 2 AM please page floor coverage  PA If 7AM-7PM, please contact the  day team taking care of the Jeffrey Nelson  [email or web address]  Password [email or web address]

## 2017-12-03 NOTE — ED Notes (Signed)
Patient vomiting and dry heaving.

## 2017-12-04 ENCOUNTER — Observation Stay (HOSPITAL_BASED_OUTPATIENT_CLINIC_OR_DEPARTMENT_OTHER): Payer: Medicare HMO

## 2017-12-04 DIAGNOSIS — I361 Nonrheumatic tricuspid (valve) insufficiency: Secondary | ICD-10-CM | POA: Diagnosis not present

## 2017-12-04 DIAGNOSIS — Z9889 Other specified postprocedural states: Secondary | ICD-10-CM | POA: Diagnosis not present

## 2017-12-04 DIAGNOSIS — E2740 Unspecified adrenocortical insufficiency: Secondary | ICD-10-CM | POA: Diagnosis not present

## 2017-12-04 DIAGNOSIS — E860 Dehydration: Secondary | ICD-10-CM | POA: Diagnosis not present

## 2017-12-04 DIAGNOSIS — I9589 Other hypotension: Secondary | ICD-10-CM | POA: Diagnosis not present

## 2017-12-04 DIAGNOSIS — M5440 Lumbago with sciatica, unspecified side: Secondary | ICD-10-CM

## 2017-12-04 DIAGNOSIS — I771 Stricture of artery: Secondary | ICD-10-CM | POA: Diagnosis not present

## 2017-12-04 DIAGNOSIS — R55 Syncope and collapse: Secondary | ICD-10-CM

## 2017-12-04 DIAGNOSIS — G8929 Other chronic pain: Secondary | ICD-10-CM | POA: Diagnosis not present

## 2017-12-04 DIAGNOSIS — I2510 Atherosclerotic heart disease of native coronary artery without angina pectoris: Secondary | ICD-10-CM

## 2017-12-04 DIAGNOSIS — E119 Type 2 diabetes mellitus without complications: Secondary | ICD-10-CM | POA: Diagnosis not present

## 2017-12-04 DIAGNOSIS — M5442 Lumbago with sciatica, left side: Secondary | ICD-10-CM | POA: Diagnosis not present

## 2017-12-04 LAB — COMPREHENSIVE METABOLIC PANEL
ALBUMIN: 2.9 g/dL — AB (ref 3.5–5.0)
ALK PHOS: 132 U/L — AB (ref 38–126)
ALT: 15 U/L — ABNORMAL LOW (ref 17–63)
AST: 18 U/L (ref 15–41)
Anion gap: 8 (ref 5–15)
BILIRUBIN TOTAL: 0.6 mg/dL (ref 0.3–1.2)
BUN: 19 mg/dL (ref 6–20)
CALCIUM: 9.1 mg/dL (ref 8.9–10.3)
CO2: 25 mmol/L (ref 22–32)
Chloride: 103 mmol/L (ref 101–111)
Creatinine, Ser: 1 mg/dL (ref 0.61–1.24)
GFR calc Af Amer: >60 mL/min (ref >60)
GLUCOSE: 138 mg/dL — AB (ref 65–99)
Potassium: 4.1 mmol/L (ref 3.5–5.1)
Sodium: 136 mmol/L (ref 135–145)
TOTAL PROTEIN: 5.7 g/dL — AB (ref 6.5–8.1)

## 2017-12-04 LAB — GLUCOSE, CAPILLARY
GLUCOSE-CAPILLARY: 102 mg/dL — AB (ref 65–99)
GLUCOSE-CAPILLARY: 160 mg/dL — AB (ref 65–99)
Glucose-Capillary: 128 mg/dL — ABNORMAL HIGH (ref 65–99)
Glucose-Capillary: 123 mg/dL — ABNORMAL HIGH (ref 65–99)

## 2017-12-04 LAB — CBC
HCT: 31.9 % — ABNORMAL LOW (ref 39.0–52.0)
Hemoglobin: 10.3 g/dL — ABNORMAL LOW (ref 13.0–17.0)
MCH: 31 pg (ref 26.0–34.0)
MCHC: 32.3 g/dL (ref 30.0–36.0)
MCV: 96.1 fL (ref 78.0–100.0)
Platelets: 354 10*3/uL (ref 150–400)
RBC: 3.32 MIL/uL — ABNORMAL LOW (ref 4.22–5.81)
RDW: 12.7 % (ref 11.5–15.5)
WBC: 11.5 10*3/uL — ABNORMAL HIGH (ref 4.0–10.5)

## 2017-12-04 LAB — ECHOCARDIOGRAM COMPLETE
HEIGHTINCHES: 74 in
WEIGHTICAEL: 3264 [oz_av]

## 2017-12-04 LAB — PHOSPHORUS: Phosphorus: 3.2 mg/dL (ref 2.5–4.6)

## 2017-12-04 LAB — MAGNESIUM: MAGNESIUM: 1.6 mg/dL — AB (ref 1.7–2.4)

## 2017-12-04 LAB — CORTISOL: Cortisol, Plasma: 2.1 ug/dL

## 2017-12-04 LAB — HEMOGLOBIN A1C
HEMOGLOBIN A1C: 6.8 % — AB (ref 4.8–5.6)
MEAN PLASMA GLUCOSE: 148.46 mg/dL

## 2017-12-04 LAB — MRSA PCR SCREENING: MRSA by PCR: NEGATIVE

## 2017-12-04 LAB — TSH: TSH: 1.607 u[IU]/mL (ref 0.350–4.500)

## 2017-12-04 LAB — TROPONIN I

## 2017-12-04 MED ORDER — CLOPIDOGREL BISULFATE 75 MG PO TABS
75.0000 mg | ORAL_TABLET | Freq: Every day | ORAL | Status: DC
  Administered 2017-12-04: 75 mg via ORAL
  Filled 2017-12-04: qty 1

## 2017-12-04 MED ORDER — GABAPENTIN 300 MG PO CAPS
300.0000 mg | ORAL_CAPSULE | Freq: Two times a day (BID) | ORAL | Status: DC
  Administered 2017-12-04 (×2): 300 mg via ORAL
  Filled 2017-12-04 (×2): qty 1

## 2017-12-04 MED ORDER — POLYETHYLENE GLYCOL 3350 17 G PO PACK
17.0000 g | PACK | Freq: Every day | ORAL | Status: DC
  Administered 2017-12-04 – 2017-12-15 (×9): 17 g via ORAL
  Filled 2017-12-04 (×11): qty 1

## 2017-12-04 MED ORDER — HYDROMORPHONE HCL 1 MG/ML IJ SOLN
0.5000 mg | INTRAMUSCULAR | Status: DC | PRN
  Administered 2017-12-04 – 2017-12-07 (×10): 0.5 mg via INTRAVENOUS
  Filled 2017-12-04 (×14): qty 0.5

## 2017-12-04 MED ORDER — ASPIRIN EC 81 MG PO TBEC
81.0000 mg | DELAYED_RELEASE_TABLET | Freq: Every day | ORAL | Status: DC
  Administered 2017-12-04 – 2017-12-17 (×13): 81 mg via ORAL
  Filled 2017-12-04 (×13): qty 1

## 2017-12-04 MED ORDER — INSULIN ASPART 100 UNIT/ML ~~LOC~~ SOLN
0.0000 [IU] | Freq: Three times a day (TID) | SUBCUTANEOUS | Status: DC
  Administered 2017-12-04 – 2017-12-05 (×2): 2 [IU] via SUBCUTANEOUS
  Administered 2017-12-06: 1 [IU] via SUBCUTANEOUS
  Administered 2017-12-06: 2 [IU] via SUBCUTANEOUS
  Administered 2017-12-08 – 2017-12-09 (×2): 1 [IU] via SUBCUTANEOUS
  Administered 2017-12-10: 2 [IU] via SUBCUTANEOUS
  Administered 2017-12-10: 1 [IU] via SUBCUTANEOUS
  Administered 2017-12-12 (×2): 2 [IU] via SUBCUTANEOUS
  Administered 2017-12-12: 1 [IU] via SUBCUTANEOUS
  Administered 2017-12-13: 2 [IU] via SUBCUTANEOUS
  Administered 2017-12-13: 1 [IU] via SUBCUTANEOUS
  Administered 2017-12-14 (×2): 2 [IU] via SUBCUTANEOUS
  Administered 2017-12-14 – 2017-12-17 (×8): 1 [IU] via SUBCUTANEOUS
  Administered 2017-12-17: 2 [IU] via SUBCUTANEOUS

## 2017-12-04 MED ORDER — ACETAMINOPHEN 650 MG RE SUPP: 650.0000 mg | Freq: Four times a day (QID) | RECTAL | Status: DC | PRN

## 2017-12-04 MED ORDER — ZOLPIDEM TARTRATE 5 MG PO TABS
5.0000 mg | ORAL_TABLET | Freq: Every evening | ORAL | Status: DC | PRN
  Administered 2017-12-05 – 2017-12-12 (×9): 5 mg via ORAL
  Filled 2017-12-04 (×10): qty 1

## 2017-12-04 MED ORDER — SODIUM CHLORIDE 0.9 % IV SOLN
INTRAVENOUS | Status: DC
  Administered 2017-12-04: 75 mL/h via INTRAVENOUS
  Administered 2017-12-04 – 2017-12-10 (×8): via INTRAVENOUS

## 2017-12-04 MED ORDER — SODIUM CHLORIDE 0.9% FLUSH
3.0000 mL | Freq: Two times a day (BID) | INTRAVENOUS | Status: DC
  Administered 2017-12-04 – 2017-12-17 (×17): 3 mL via INTRAVENOUS

## 2017-12-04 MED ORDER — BISACODYL 10 MG RE SUPP
10.0000 mg | Freq: Every day | RECTAL | Status: DC | PRN
  Administered 2017-12-07: 10 mg via RECTAL
  Filled 2017-12-04 (×2): qty 1

## 2017-12-04 MED ORDER — INSULIN ASPART 100 UNIT/ML ~~LOC~~ SOLN
0.0000 [IU] | Freq: Every day | SUBCUTANEOUS | Status: DC
Start: 2017-12-04 — End: 2017-12-17
  Administered 2017-12-11: 2 [IU] via SUBCUTANEOUS

## 2017-12-04 MED ORDER — ONDANSETRON HCL 4 MG PO TABS: 4.0000 mg | ORAL_TABLET | Freq: Four times a day (QID) | ORAL | Status: DC | PRN

## 2017-12-04 MED ORDER — COSYNTROPIN 0.25 MG IJ SOLR
0.2500 mg | Freq: Once | INTRAMUSCULAR | Status: AC
  Administered 2017-12-05: 0.25 mg via INTRAVENOUS
  Filled 2017-12-04: qty 0.25

## 2017-12-04 MED ORDER — ONDANSETRON HCL 4 MG/2ML IJ SOLN: 4.0000 mg | Freq: Four times a day (QID) | INTRAMUSCULAR | Status: DC | PRN

## 2017-12-04 MED ORDER — ENSURE ENLIVE PO LIQD
237.0000 mL | Freq: Two times a day (BID) | ORAL | Status: DC
  Administered 2017-12-04 – 2017-12-14 (×15): 237 mL via ORAL

## 2017-12-04 MED ORDER — ACETAMINOPHEN 325 MG PO TABS
650.0000 mg | ORAL_TABLET | Freq: Four times a day (QID) | ORAL | Status: DC | PRN
  Administered 2017-12-04 – 2017-12-17 (×10): 650 mg via ORAL
  Filled 2017-12-04 (×13): qty 2

## 2017-12-04 NOTE — Progress Notes (Signed)
Patient's daughter, Selena BattenKim called for update on patient. Daughter passed along that she is "concerned where my dad's pain is coming from. Last week he went to the doctor and his prostate was enlarged; both his brother and dad passed away from prostate cancer." She is requesting blood tests be done to find out more information.  Name and phone number added to chart.

## 2017-12-04 NOTE — Consult Note (Addendum)
Reason for Consult:Left Thigh pain, pain with standing and walking. Referring Physician: Dr. 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 3)CAD 4)Thoracolumbar scoliosis, collapsing degenerative disc pattern with previous left L4-5 and L5-S1 foramenal stenosis. 5)L4-5 minimal spondylolisthesis. 6)Diabetes Mellitus, Type 2 previous oral antihypoglycemic agents, recently discontinued by primary care when BS had Become normalized now with elevated BS 140-170s.  RFX:JOITGP Viona Gilmore Korb is an 83 y.o. male.Admitted to Dr. Arman Filter service yesterday when he was found to be  Hypotensive and fainted while at his appointment at [facility] office yesterday afternoon about 2PM.  Mr. Simonich developed severe right leg pain during the 24 hours following washing his wife's new car 10/04/2017. Pain Was severe right thigh and he presented to my office in a wheel chair 10/05/2017. Findings were consistent with a upper lumbar radiculopathy and he was started on a medrol dose pak and narcotic medications. Initial exam showed positive reverse SLR on the right but no weakness. Over the next 3 days he began experiencing weakness and fell several times. He was admitted to [facility] on 10/09/2017 and MRI of the right hip was negative, MRI of the lumbar spine scheduled for the  Same sitting unable to be accomplished due to claustrophobia. Due to plavix and asa, myelogram was not possible and a MRI with and without contrast of the lumbar  Spine was done under a general anesthestic on 10/12/2017. This demontrated a disc herniation right L3-4 with right subarticular stenosis affecting the right L3  and L4 nerve roots. On  2/16 he underwent right L3-4 microdiscectomy. Discharged home 2/17. He did well for about 10 days, at  Follow up appoint back and leg pain resolved. He began developing left thigh pain and left buttock pain, over the next 2-3 weeks this progressed and attempts to obtain a repeat  MRI were delayed by concern to have MRI with anesthesia Again due to claustrophobia, scheduling would take 2-3 weeks to be performed. Due to worsening pain and limited Function his wife took him to the ER at [facility] And there an MRI was done showing a new disc herniation Left (opposite side of previous HNP) L3-4. He was admitted to [facility] 11/11/2017 and underwent a left L3-4 microdiscectomy 11/12/2017. Post op he had some improvement in his back and leg pain but within days the left Thigh pain has recurred. Seen 11/22/2017 with increased left anterior thigh pain, back pain was improved. Discussed a repeat MRI at that time but was not really interested in considering more surgery. He was maintained on meds and walking program at home. His pain is persisting into the left anterior thigh. He was to be seen in my office yesterday but before he could be evaluated he fainted and Nursing personnel performed vital signs demonstrating the Low blood pressure and he was sent to [facility] ER via ambulance. Pain is left anterior thigh and numbness along the left anterior thigh to the left knee and along the medial left thigh and calf. Worse with standing and walking.      Past Medical History:  Diagnosis Date  . Anginal pain ([place])    occ  . Arthritis    "joints" (10/09/2017)  . Chronic lower back pain   .  Complication of anesthesia    extremely claustrophobic; "have to be put down for MRI; don't sit in back seat of car;, etc."  . Coronary artery disease   . High cholesterol    "get shots twice/month" (10/09/2017)  . History of blood transfusion 2001   "related to CABG"  . History of kidney stones   . HOH  (hard of hearing)    wearing hearing aids  . Hypertension   . Myocardial infarction ([place]) 2000s-06/2017 X 4  . Stroke [facility]) ~ 2015   "in my right eye; sight is coming back little by little" (10/09/2017)  . Type II diabetes mellitus ([place])     Past Surgical History:  Procedure Laterality Date  . ANTERIOR CERVICAL DECOMP/DISCECTOMY FUSION N/A 05/14/2015   Procedure: C3-4  ANTERIOR CERVICAL DISCECTOMY AND FUSION WITH PLATE AND SCREWS, LOCAL AND ALLOGRAFT BONE GRAFT;  Surgeon: Jessy Oto, MD;  Location: [place];  Service: Orthopedics;  Laterality: N/A;  . BACK SURGERY    . CARDIAC CATHETERIZATION     X 17 (10/09/2017)  . CATARACT EXTRACTION W/ INTRAOCULAR LENS  IMPLANT, BILATERAL Bilateral   . COLONOSCOPY    . CORONARY ANGIOPLASTY  06/2017  . CORONARY ANGIOPLASTY WITH STENT PLACEMENT     "6 stents total" (10/09/2017)  . CORONARY ARTERY BYPASS GRAFT  2001   CABG X4  . CYSTOSCOPY W/ STONE MANIPULATION    . JOINT REPLACEMENT    . LUMBAR LAMINECTOMY Right 10/13/2017   Procedure: MICRODISCECTOMY LUMBAR LAMINECTOMY RIGHT L3-4;  Surgeon: Jessy Oto, MD;  Location: [place];  Service: Orthopedics;  Laterality: Right;  . LUMBAR LAMINECTOMY Left 11/12/2017   Procedure: MICRODISCECTOMY L3-4 FOR RECURRENT HNP POSSIBLE FLURO;  Surgeon: Jessy Oto, MD;  Location: [place];  Service: Orthopedics;  Laterality: Left;  . LUMBAR LAMINECTOMY/DECOMPRESSION MICRODISCECTOMY N/A 01/16/2014   Procedure: Left L3-4 and L4-5 foraminotomies;  Surgeon: Jessy Oto, MD;  Location: [place];  Service: Orthopedics;  Laterality: N/A;  . NASAL FRACTURE SURGERY    . NASAL SINUS SURGERY     "several times since 1959"  . RADIOLOGY WITH ANESTHESIA N/A 10/12/2017   Procedure: MRI WITH ANESTHESIA;  Surgeon: Radiologist, Medication, MD;  Location: [place];  Service: Radiology;  Laterality: N/A;  . SHOULDER OPEN ROTATOR CUFF REPAIR Bilateral   . TOTAL KNEE ARTHROPLASTY Right 2012  . TYMPANOSTOMY TUBE PLACEMENT Bilateral   . ULNAR  NERVE TRANSPOSITION Left 05/14/2015   Procedure: LEFT ULNAR NERVE DECOMPRESSION AT THE ELBOW;  Surgeon: Jessy Oto, MD;  Location: [place];  Service: Orthopedics;  Laterality: Left;    Family History  Problem Relation Age of Onset  . CAD Brother   . Diabetes Neg Hx     Social History:  reports that he quit smoking about 60 years ago. He has a 2.00 pack-year smoking history. He has never used smokeless tobacco. He reports that he does not drink alcohol or use drugs.  Allergies:  Allergies  Allergen Reactions  . Ezetimibe Other (See Comments)    Cramps  . Morphine And Related Other (See Comments)    Confusion  . Statins Other (See Comments)    Cramps    Medications:  Prior to Admission:  Medications Prior to Admission  Medication Sig Dispense Refill Last Dose  . amLODipine (NORVASC) 10 MG tablet 10 mg daily.    12/03/2017 at Unknown time  . fluticasone (FLONASE) 50 MCG/ACT nasal spray Place 2 sprays into both nostrils as needed for allergies or  rhinitis.    at prn  . meloxicam (MOBIC) 7.5 MG tablet Take 7.5 mg by mouth daily.  0   . metoprolol tartrate (LOPRESSOR) 50 MG tablet Take 50 mg by mouth 2 (two) times daily.   11/28/2017  . oxyCODONE-acetaminophen (PERCOCET/ROXICET) 5-325 MG tablet Take 1 tablet by mouth every 6 (six) hours as needed for severe pain. 50 tablet 0  at prn  . aspirin EC 81 MG tablet Take 81 mg by mouth daily.   Taking  . clopidogrel (PLAVIX) 75 MG tablet Take 75 mg by mouth daily.   Taking  . Coenzyme Q10 (COQ10 PO) Take 1 tablet by mouth daily.   Taking  . Cranberry 500 MG TABS Take 500 mg by mouth daily.   Taking  . docusate sodium (COLACE) 100 MG capsule Take 200 mg by mouth 2 (two) times daily as needed for mild constipation.   Taking  . Evolocumab (REPATHA) 140 MG/ML SOSY Inject 140 mg into the skin every 14 (fourteen) days.   Taking  . gabapentin (NEURONTIN) 300 MG capsule Take 1 capsule (300 mg total) by mouth at bedtime for 7 days, THEN 1 capsule (300 mg  total) 2 (two) times daily for 28 days. (Patient taking differently: 1 capsule (393m) twice daily) 63 capsule 0 Taking  . isosorbide mononitrate (IMDUR) 30 MG 24 hr tablet Take 30 mg by mouth daily.   Taking  . lisinopril (PRINIVIL,ZESTRIL) 5 MG tablet Take 5 mg by mouth daily.   Taking  . Multiple Vitamin (MULTIVITAMIN WITH MINERALS) TABS tablet Take 1 tablet by mouth daily.   Taking  . oxyCODONE (OXYCONTIN) 20 mg 12 hr tablet Take 1 tablet (20 mg total) by mouth every 12 (twelve) hours. 14 tablet 0 Taking   Scheduled: . aspirin EC  81 mg Oral Daily  . clopidogrel  75 mg Oral Daily  . [START ON 12/05/2017] cosyntropin  0.25 mg Intravenous Once  . feeding supplement (ENSURE ENLIVE)  237 mL Oral BID BM  . gabapentin  300 mg Oral BID  . insulin aspart  0-5 Units Subcutaneous QHS  . insulin aspart  0-9 Units Subcutaneous TID WC  . polyethylene glycol  17 g Oral Daily  . sodium chloride flush  3 mL Intravenous Q12H   Continuous: . sodium chloride 75 mL/hr (12/04/17 0122)   PYYT:[id number]**OR** acetaminophen, bisacodyl, ondansetron **OR** ondansetron (ZOFRAN) IV, oxyCODONE  Results for orders placed or performed during the hospital encounter of 12/03/17 (from the past 48 hour(s))  CBC WITH DIFFERENTIAL     Status: Abnormal   Collection Time: 12/03/17  3:33 PM  Result Value Ref Range   WBC 15.2 (H) 4.0 - 10.5 K/uL   RBC 3.77 (L) 4.22 - 5.81 MIL/uL   Hemoglobin 11.9 (L) 13.0 - 17.0 g/dL   HCT 36.2 (L) 39.0 - 52.0 %   MCV 96.0 78.0 - 100.0 fL   MCH 31.6 26.0 - 34.0 pg   MCHC 32.9 30.0 - 36.0 g/dL   RDW 12.7 11.5 - 15.5 %   Platelets 414 (H) 150 - 400 K/uL   Neutrophils Relative % 78 %   Neutro Abs 11.9 (H) 1.7 - 7.7 K/uL   Lymphocytes Relative 12 %   Lymphs Abs 1.8 0.7 - 4.0 K/uL   Monocytes Relative 9 %   Monocytes Absolute 1.4 (H) 0.1 - 1.0 K/uL   Eosinophils Relative 1 %   Eosinophils Absolute 0.1 0.0 - 0.7 K/uL   Basophils Relative 0 %   Basophils  Absolute 0.1 0.0 - 0.1  K/uL    Comment: Performed at [facility], [place] [address], [place], [place] [address]  Comprehensive metabolic panel     Status: Abnormal   Collection Time: 12/03/17  3:33 PM  Result Value Ref Range   Sodium 137 135 - 145 mmol/L   Potassium 4.4 3.5 - 5.1 mmol/L   Chloride 97 (L) 101 - 111 mmol/L   CO2 24 22 - 32 mmol/L   Glucose, Bld 137 (H) 65 - 99 mg/dL   BUN 24 (H) 6 - 20 mg/dL   Creatinine, Ser 1.21 0.61 - 1.24 mg/dL   Calcium 9.1 8.9 - 10.3 mg/dL   Total Protein 6.8 6.5 - 8.1 g/dL   Albumin 3.4 (L) 3.5 - 5.0 g/dL   AST 25 15 - 41 U/L   ALT 16 (L) 17 - 63 U/L   Alkaline Phosphatase 165 (H) 38 - 126 U/L   Total Bilirubin 0.5 0.3 - 1.2 mg/dL   GFR calc non Af Amer 54 (L) >60 mL/min   GFR calc Af Amer >60 >60 mL/min    Comment: (NOTE) The eGFR has been calculated using the CKD EPI equation. This calculation has not been validated in all clinical situations. eGFR's persistently <60 mL/min signify possible Chronic Kidney Disease.    Anion gap 16 (H) 5 - 15    Comment: Performed at [facility], [place] [address], [place], [place] [address]  CBG monitoring, ED     Status: Abnormal   Collection Time: 12/03/17  4:04 PM  Result Value Ref Range   Glucose-Capillary 133 (H) 65 - 99 mg/dL  I-stat troponin, ED     Status: None   Collection Time: 12/03/17  4:31 PM  Result Value Ref Range   Troponin i, poc 0.02 0.00 - 0.08 ng/mL   Comment 3            Comment: Due to the release kinetics of cTnI, a negative result within the first hours of the onset of symptoms does not rule out myocardial infarction with certainty. If myocardial infarction is still suspected, repeat the test at appropriate intervals.   I-Stat CG4 Lactic Acid, ED     Status: Abnormal   Collection Time: 12/03/17  4:54 PM  Result Value Ref Range   Lactic Acid, Venous 1.96 (H) 0.5 - 1.9 mmol/L  Urinalysis, Routine w reflex microscopic     Status: Abnormal   Collection Time: 12/03/17  7:37 PM  Result  Value Ref Range   Color, Urine YELLOW YELLOW   APPearance CLEAR CLEAR   Specific Gravity, Urine 1.040 (H) 1.005 - 1.030   pH 5.0 5.0 - 8.0   Glucose, UA NEGATIVE NEGATIVE mg/dL   Hgb urine dipstick NEGATIVE NEGATIVE   Bilirubin Urine NEGATIVE NEGATIVE   Ketones, ur NEGATIVE NEGATIVE mg/dL   Protein, ur NEGATIVE NEGATIVE mg/dL   Nitrite NEGATIVE NEGATIVE   Leukocytes, UA NEGATIVE NEGATIVE    Comment: Performed at [facility] [address] [place], [place] [address]  I-Stat CG4 Lactic Acid, ED     Status: None   Collection Time: 12/03/17  7:55 PM  Result Value Ref Range   Lactic Acid, Venous 1.13 0.5 - 1.9 mmol/L  Troponin I (q 6hr x 3)     Status: None   Collection Time: 12/03/17  9:47 PM  Result Value Ref Range   Troponin I <0.03 <0.03 ng/mL    Comment: Performed at [facility], [place] [address], [place], [place] [address]  MRSA PCR Screening     Status: None   Collection Time: 12/04/17  2:24 AM  Result Value Ref Range   MRSA by PCR NEGATIVE NEGATIVE    Comment:        The GeneXpert MRSA Assay (FDA approved for NASAL specimens only), is one component of a comprehensive MRSA colonization surveillance program. It is not intended to diagnose MRSA infection nor to guide or monitor treatment for MRSA infections. Performed at [facility], [place] [address] [place], [place] [address]   Troponin I (q 6hr x 3)     Status: None   Collection Time: 12/04/17  3:42 AM  Result Value Ref Range   Troponin I <0.03 <0.03 ng/mL    Comment: Performed at [facility] [address], [place], [place] [address]  Magnesium     Status: Abnormal   Collection Time: 12/04/17  3:42 AM  Result Value Ref Range   Magnesium 1.6 (L) 1.7 - 2.4 mg/dL    Comment: Performed at [facility] [address] [place], [place] [address]  Phosphorus     Status: None   Collection Time: 12/04/17  3:42 AM  Result Value Ref Range   Phosphorus 3.2 2.5 - 4.6 mg/dL    Comment:  Performed at [facility] [place] [address] [place], [place] [address]  TSH     Status: None   Collection Time: 12/04/17  3:42 AM  Result Value Ref Range   TSH 1.607 0.350 - 4.500 uIU/mL    Comment: Performed by a 3rd Generation assay with a functional sensitivity of <=0.01 uIU/mL. Performed at [facility], [place] [address] [place], [place] [address]   Comprehensive metabolic panel     Status: Abnormal   Collection Time: 12/04/17  3:42 AM  Result Value Ref Range   Sodium 136 135 - 145 mmol/L   Potassium 4.1 3.5 - 5.1 mmol/L   Chloride 103 101 - 111 mmol/L   CO2 25 22 - 32 mmol/L   Glucose, Bld 138 (H) 65 - 99 mg/dL   BUN 19 6 - 20 mg/dL   Creatinine, Ser 1.00 0.61 - 1.24 mg/dL   Calcium 9.1 8.9 - 10.3 mg/dL   Total Protein 5.7 (L) 6.5 - 8.1 g/dL   Albumin 2.9 (L) 3.5 - 5.0 g/dL   AST 18 15 - 41 U/L   ALT 15 (L) 17 - 63 U/L   Alkaline Phosphatase 132 (H) 38 - 126 U/L   Total Bilirubin 0.6 0.3 - 1.2 mg/dL   GFR calc non Af Amer >60 >60 mL/min   GFR calc Af Amer >60 >60 mL/min    Comment: (NOTE) The eGFR has been calculated using the CKD EPI equation. This calculation has not been validated in all clinical situations. eGFR's persistently <60 mL/min signify possible Chronic Kidney Disease.    Anion gap 8 5 - 15    Comment: Performed at [facility] [address], [place] [address]  CBC     Status: Abnormal   Collection Time: 12/04/17  3:42 AM  Result Value Ref Range   WBC 11.5 (H) 4.0 - 10.5 K/uL   RBC 3.32 (L) 4.22 - 5.81 MIL/uL   Hemoglobin 10.3 (L) 13.0 - 17.0 g/dL   HCT 31.9 (L) 39.0 - 52.0 %   MCV 96.1 78.0 - 100.0 fL   MCH 31.0 26.0 -  34.0 pg   MCHC 32.3 30.0 - 36.0 g/dL   RDW 12.7 11.5 - 15.5 %   Platelets 354 150 - 400 K/uL    Comment: Performed at [facility], [place] [address] [place], [place] [address]  Hemoglobin A1c     Status: Abnormal   Collection Time: 12/04/17  3:42 AM  Result Value Ref Range   Hgb A1c MFr Bld 6.8 (H)  4.8 - 5.6 %    Comment: (NOTE) Pre diabetes:          5.7%-6.4% Diabetes:              >6.4% Glycemic control for   <7.0% adults with diabetes    Mean Plasma Glucose 148.46 mg/dL    Comment: Performed at [facility] [address], [place], [place] [address]  Glucose, capillary     Status: Abnormal   Collection Time: 12/04/17  4:57 AM  Result Value Ref Range   Glucose-Capillary 128 (H) 65 - 99 mg/dL  Troponin I (q 6hr x 3)     Status: None   Collection Time: 12/04/17  5:58 AM  Result Value Ref Range   Troponin I <0.03 <0.03 ng/mL    Comment: Performed at [facility], [place] [address] [place], [place] [address]  Cortisol     Status: None   Collection Time: 12/04/17  5:58 AM  Result Value Ref Range   Cortisol, Plasma 2.1 ug/dL    Comment: (NOTE) AM    6.7 - 22.6 ug/dL PM   <10.0       ug/dL Performed at [facility] [address], [place], [place] [address]   Glucose, capillary     Status: Abnormal   Collection Time: 12/04/17  7:39 AM  Result Value Ref Range   Glucose-Capillary 102 (H) 65 - 99 mg/dL   Comment 1 Notify RN    Comment 2 Document in Chart     Dg Abd 1 View  Result Date: 12/03/2017 CLINICAL DATA:  Constipation. EXAM: ABDOMEN - 1 VIEW COMPARISON:  CT angiography 3 hours prior. FINDINGS: Normal bowel gas pattern. No free intra-abdominal air. Small volume of colonic stool is seen on CT earlier this day. Excreted IV contrast within the urinary bladder. Linear density projecting over the upper abdomen is presumed external to the patient without correlate on CT earlier this day. IMPRESSION: Normal bowel gas pattern.  Small volume of colonic stool. Electronically Signed   By: Jeb Levering M.D.   On: 12/03/2017 22:33   Ct Angio Chest/abd/pel For Dissection W And/or W/wo  Result Date: 12/03/2017 CLINICAL DATA:  Back pain. EXAM: CT ANGIOGRAPHY CHEST, ABDOMEN AND PELVIS TECHNIQUE: Multidetector CT imaging through the chest, abdomen and pelvis was  performed using the standard protocol during bolus administration of intravenous contrast. Multiplanar reconstructed images and MIPs were obtained and reviewed to evaluate the vascular anatomy. CONTRAST:  146m ISOVUE-370 IOPAMIDOL (ISOVUE-370) INJECTION 76% COMPARISON:  CT scan of April 02, 2008. FINDINGS: CTA CHEST FINDINGS Cardiovascular: Atherosclerosis of thoracic aorta is noted without aneurysm or dissection. Status post coronary artery bypass graft. No pericardial effusion is noted. Severe stenosis is noted at the origin of the right subclavian artery. Otherwise great vessels are widely patent. Mediastinum/Nodes: No enlarged mediastinal, hilar, or axillary lymph nodes. Thyroid gland, trachea, and esophagus demonstrate no significant findings. Lungs/Pleura: Lungs are clear. No pleural effusion or pneumothorax. Musculoskeletal: No chest wall abnormality. No acute or significant osseous findings. Review of the  MIP images confirms the above findings. CTA ABDOMEN AND PELVIS FINDINGS VASCULAR Aorta: Atherosclerosis of abdominal aorta is noted without aneurysm or dissection. Celiac: Patent without evidence of aneurysm, dissection, vasculitis or significant stenosis. SMA: Patent without evidence of aneurysm, dissection, vasculitis or significant stenosis. Renals: Both renal arteries are patent without evidence of aneurysm, dissection, vasculitis, fibromuscular dysplasia or significant stenosis. IMA: Patent without evidence of aneurysm, dissection, vasculitis or significant stenosis. Inflow: Patent without evidence of aneurysm, dissection, vasculitis or significant stenosis. Veins: No obvious venous abnormality within the limitations of this arterial phase study. Review of the MIP images confirms the above findings. NON-VASCULAR Hepatobiliary: No focal liver abnormality is seen. No gallstones, gallbladder wall thickening, or biliary dilatation. Pancreas: Unremarkable. No pancreatic ductal dilatation or surrounding  inflammatory changes. Spleen: Normal in size without focal abnormality. Adrenals/Urinary Tract: Adrenal glands are unremarkable. Kidneys are normal, without renal calculi, focal lesion, or hydronephrosis. Bladder is unremarkable. Stomach/Bowel: Stomach is within normal limits. Appendix appears normal. No evidence of bowel wall thickening, distention, or inflammatory changes. Lymphatic: No significant adenopathy is noted. Reproductive: Prostate is unremarkable. Other: No abdominal wall hernia or abnormality. No abdominopelvic ascites. Musculoskeletal: No acute or significant osseous findings. Review of the MIP images confirms the above findings. IMPRESSION: No evidence of thoracic or abdominal aortic dissection or aneurysm. Severe stenosis is noted at the origin of the right subclavian artery from the right innominate artery. No acute abnormality seen in the abdomen or pelvis. Electronically Signed   By: Marijo Conception, M.D.   On: 12/03/2017 19:31    ROS Blood pressure (!) 107/55, pulse 79, temperature 98.3 F (36.8 C), resp. rate 12, height '6\' 2"'  (1.88 m), weight 204 lb (92.5 kg), SpO2 98 %. Physical Exam  Orthopaedic Exam: Left leg motor is normal, SLR in negative but reserve SLR is positive on the left side. He has no pain with left hip flexion and IR/ER left hip in flexion but pain is present with figure of 4, stressing of the left SI joint.  He is tender over the left sciatic notch. Knee reflex 2= right / 1+ left. Ankle 0/0. CT angio of the chest and abdomen reviewed shows DDD L2-3, L3-4, L4-5 and L5-S1 no soft  Tissue swelling paralumbar or psoas muscles. Minimal spondylolisthesis L4-5 as seen previously and unchanged. CT is not sensitive enough to discern disc herniation or recurrent herniation.    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 3)CAD 4)Thoracolumbar scoliosis, collapsing degenerative disc pattern with previous left L4-5 and L5-S1 foramenal stenosis. 5)L4-5 minimal spondylolisthesis. 6)Diabetes Mellitus, Type 2 previous oral antihypoglycemic agents, recently discontinued by primary care when BS had Become normalized now with elevated BS 140-170s.  Plan:Recommend MRI of the lumbar spine with and without  contrast to assess for left L3 nerve compression.  He has Had difficulties with claudrophobia and an MRI under a general anesthesia is reasonable to allow best imaging study. Medicine work up of hypotension and assess for  Any cardiac concerns. If Recurrent HNP then a redo microdiscectomy.   Basil Dess 12/04/2017, 11:33 AM

## 2017-12-04 NOTE — Progress Notes (Signed)
0200: Handoff report received from ED RN. Pt arrived to the unit on the stretcher without family or visitors. Pt transferred to bed and oriented to unit. Discussed plan of care for the shift; pt amenable to plan. Pt requesting home dose of ambien for sleep; MD paged.  0400: Pt continues resting comfortably.  0700: Handoff report given to RN. No acute events overnight.

## 2017-12-04 NOTE — Progress Notes (Signed)
*  PRELIMINARY RESULTS* Vascular Ultrasound Carotid Duplex (Doppler) has been completed.   Findings suggest 1-39% internal carotid artery stenosis bilaterally. Vertebral arteries are patent with antegrade flow.  12/04/2017 12:52 PM Gertie FeyMichelle Awesome Jared, BS, RVT, RDCS, RDMS

## 2017-12-04 NOTE — Evaluation (Signed)
Physical Therapy Evaluation Patient Details Name: Jeffrey Nelson MRN: [id number] DOB: 1935/07/23 Today's Date: 12/04/2017   History of Present Illness  83 yo admitted after syncope at orthopedics office. PMHx: severe back pain s/p microdiscectomy 3/18, CAD, DM 2, HTN, MI, CVA, HOH  Clinical Impression  Pt pleasant and reports he was in orthopedic office for evaluation of left knee pain. According to pt knee has been getting worse and giving way causing 4 falls leading to knee for back surgeries per pt and currently knee pain limiting gait and functional mobility with pt only able to walk room to room in home since last D/C. Pt with decreased strength, function, gait and activity tolerance who will benefit from acute therapy to maximize mobility and function to return to mod I level.      Follow Up Recommendations Home health PT;Supervision for mobility/OOB    Equipment Recommendations  None recommended by PT    Recommendations for Other Services       Precautions / Restrictions Precautions Precautions: Back;Fall      Mobility  Bed Mobility               General bed mobility comments: in chair on arrival  Transfers Overall transfer level: Modified independent               General transfer comment: assist for lines but no assist for transfer from chair or toilet  Ambulation/Gait Ambulation/Gait assistance: Supervision Ambulation Distance (Feet): 15 Feet Assistive device: Rolling walker (2 wheeled) Gait Pattern/deviations: Step-through pattern;Decreased stride length   Gait velocity interpretation: Below normal speed for age/gender General Gait Details: pt with slow steady gait pt walked 15' x 2 with seated rest but declined further due to left knee pain and reliance on RW for gait  Stairs            Wheelchair Mobility    Modified Rankin (Stroke Patients Only)       Balance Overall balance assessment: History of Falls   Sitting balance-Leahy  Scale: Good     Standing balance support: Bilateral upper extremity supported Standing balance-Leahy Scale: Poor                               Pertinent Vitals/Pain Pain Assessment: No/denies pain    Home Living Family/patient expects to be discharged to:: Private residence Living Arrangements: Spouse/significant other Available Help at Discharge: Family;Available 24 hours/day Type of Home: House Home Access: Stairs to enter Entrance Stairs-Rails: Right Entrance Stairs-Number of Steps: 5 Home Layout: One level Home Equipment: Walker - 2 wheels;Adaptive equipment Additional Comments: Patient uses RW to climb steps     Prior Function Level of Independence: Independent with assistive device(s)               Hand Dominance        Extremity/Trunk Assessment   Upper Extremity Assessment Upper Extremity Assessment: Overall WFL for tasks assessed    Lower Extremity Assessment Lower Extremity Assessment: Generalized weakness       Communication   Communication: HOH  Cognition Arousal/Alertness: Awake/alert Behavior During Therapy: WFL for tasks assessed/performed Overall Cognitive Status: Within Functional Limits for tasks assessed                                        General Comments General comments (skin integrity, edema, etc.):  pt reports 4 falls prior to back surgery, limited gait and activity tolerance since then and low BP for several months    Exercises     Assessment/Plan    PT Assessment Patient needs continued PT services  PT Problem List Decreased strength;Decreased mobility;Decreased balance;Decreased activity tolerance       PT Treatment Interventions DME instruction;Therapeutic activities;Gait training;Therapeutic exercise;Patient/family education;Stair training;Functional mobility training    PT Goals (Current goals can be found in the Care Plan section)  Acute Rehab PT Goals Patient Stated Goal: return to  walking and independence PT Goal Formulation: With patient Time For Goal Achievement: 12/18/17 Potential to Achieve Goals: Fair    Frequency     Barriers to discharge        Co-evaluation               AM-PAC PT "6 Clicks" Daily Activity  Outcome Measure Difficulty turning over in bed (including adjusting bedclothes, sheets and blankets)?: A Little Difficulty moving from lying on back to sitting on the side of the bed? : A Little Difficulty sitting down on and standing up from a chair with arms (e.g., wheelchair, bedside commode, etc,.)?: None Help needed moving to and from a bed to chair (including a wheelchair)?: A Little Help needed walking in hospital room?: A Little Help needed climbing 3-5 steps with a railing? : A Little 6 Click Score: 19    End of Session Equipment Utilized During Treatment: Gait belt Activity Tolerance: Patient tolerated treatment well Patient left: in chair;with call bell/phone within reach Nurse Communication: Mobility status PT Visit Diagnosis: Other abnormalities of gait and mobility (R26.89);Pain Pain - Right/Left: Left Pain - part of body: Knee    Time: 0981-1914 PT Time Calculation (min) (ACUTE ONLY): 21 min   Charges:   PT Evaluation $PT Eval Moderate Complexity: 1 Mod     PT G Codes:        Jeffrey Nelson, PT [phone]   Jeffrey Nelson 12/04/2017, 10:34 AM

## 2017-12-04 NOTE — Progress Notes (Signed)
  Echocardiogram 2D Echocardiogram has been performed.  Azeem Poorman L Androw 12/04/2017, 12:33 PM

## 2017-12-04 NOTE — Consult Note (Addendum)
Cardiology Consultation:   Patient ID: Jeffrey Nelson; [id number]; 07-04-1935   Admit date: 12/03/2017 Date of Consult: 12/04/2017  Primary Care Provider: Deloris Ping, MD Primary Cardiologist: Dr. Beverely Nelson - [place] Primary Electrophysiologist:  None   Patient Profile:   Jeffrey Nelson is a 83 y.o. male with a PMH of CAD s/p CABG (CTO of OM graft) with subsequent Mis and multiple angioplasty to lesions not amenable to stenting, HTN, HLD, DM type 2, CVA, chronic back pain s/p microdiscectomy x2 (02-10/2017), who is being seen today for the evaluation of syncope at the request of Dr. Elvera Nelson.  History of Present Illness:   Jeffrey Nelson presented to his orthopedist office 4/8 for follow up of excrutiating L anterior thigh pain and back pain s/p microdiscectomy. While there, he noted significant pain, and suddenly passed out. He denied any pre/post syncopal lightheadedness, dizziness, tunnel vision, chest pain, or palpitations. He has had back pain since undergoing a microdiscectomy 09/2017, and was weaned off of narcotics approximately 1 week ago due to issues with urinary retention. He reports multiple falls at home due to back pain and legs giving out. His blood pressure has been soft with SBPs in the 80s over the past week, and he reports taking 50mg  of metoprolol once a day as opposed to his typical dose of 50mg  BID. He has continue to take his other antihypertensives (lisinopril, amlodipine, and imdur) Only other syncopal episode occurred in the setting of pain with passing a kidney stone. After syncopizing, he was found to be hypotensive with SBP in the 70s and was sent to the ED for further evaluation.   He was last evaluated by cardiology outpatient 07/2017 and was thought to be doing well from a cardiac standpoint. He had been admitted 02/2017 with chest pain and underwent cardiac cath, however lesions in OM were not suitable for stenting. Last cardiac stent was placed to 06/2016. At  his last visit, he reported financial difficulty with brilinta and renexa, and both were discontinued. He was transitioned to plavix and maintained on imdur,  metoprolol, lisinopril, ASA, and repatha (intolerant to statins). He was without anginal complaints at that visit.   At the time of this interview, he states he is feeling improved from a pain standpoint. He is anticipating an MRI tomorrow and possible surgical intervention later this week. Over the past several months he has had his usual intermittent typical angina when he overexerts himself, with some associated DOE. He denies CP at rest, decrease in exercise tolerance, SOB, orthopnea, PND, LE edema, dizziness, or lightheadedness. He has been limited in his mobility over the past 1-2 months due to back/leg pain. He reports poor po intake due to decreased appetite.   Hospital course: BP stable, intermittently soft; otherwise VSS. Labs notable for electrolytes wnl, Cr 1.00, Hgb 11.9>10.3, PLT 354, trop <0.03 x3, Hgb A1C 6.8, TSH wnl, cortisol level low. CT chest without aortic dissection or aneurysm. AXR without obstruction. EKG with NSR, no STE/D, no TWI. Echo and carotid duplex pending. Antihypertensives on hold given hypotension. Cardiology asked to evaluate for syncope.   Past Medical History:  Diagnosis Date  . Anginal pain (HCC)    occ  . Arthritis    "joints" (10/09/2017)  . Chronic lower back pain   . Complication of anesthesia    extremely claustrophobic; "have to be put down for MRI; don't sit in back seat of car;, etc."  . Coronary artery disease   . High cholesterol    "  get shots twice/month" (10/09/2017)  . History of blood transfusion 2001   "related to CABG"  . History of kidney stones   . HOH (hard of hearing)    wearing hearing aids  . Hypertension   . Myocardial infarction (HCC) 2000s-06/2017 X 4  . Stroke [facility](HCC) ~ 2015   "in my right eye; sight is coming back little by little" (10/09/2017)  . Type II diabetes mellitus  (HCC)     Past Surgical History:  Procedure Laterality Date  . ANTERIOR CERVICAL DECOMP/DISCECTOMY FUSION N/A 05/14/2015   Procedure: C3-4  ANTERIOR CERVICAL DISCECTOMY AND FUSION WITH PLATE AND SCREWS, LOCAL AND ALLOGRAFT BONE GRAFT;  Surgeon: Kerrin ChampagneJames E Nitka, MD;  Location: [facility] OR;  Service: Orthopedics;  Laterality: N/A;  . BACK SURGERY    . CARDIAC CATHETERIZATION     X 17 (10/09/2017)  . CATARACT EXTRACTION W/ INTRAOCULAR LENS  IMPLANT, BILATERAL Bilateral   . COLONOSCOPY    . CORONARY ANGIOPLASTY  06/2017  . CORONARY ANGIOPLASTY WITH STENT PLACEMENT     "6 stents total" (10/09/2017)  . CORONARY ARTERY BYPASS GRAFT  2001   CABG X4  . CYSTOSCOPY W/ STONE MANIPULATION    . JOINT REPLACEMENT    . LUMBAR LAMINECTOMY Right 10/13/2017   Procedure: MICRODISCECTOMY LUMBAR LAMINECTOMY RIGHT L3-4;  Surgeon: Kerrin ChampagneNitka, James E, MD;  [facility];  Service: Orthopedics;  Laterality: Right;  . LUMBAR LAMINECTOMY Left 11/12/2017   Procedure: MICRODISCECTOMY L3-4 FOR RECURRENT HNP POSSIBLE FLURO;  Surgeon: Kerrin ChampagneNitka, James E, MD;  [facility];  Service: Orthopedics;  Laterality: Left;  . LUMBAR LAMINECTOMY/DECOMPRESSION MICRODISCECTOMY N/A 01/16/2014   Procedure: Left L3-4 and L4-5 foraminotomies;  Surgeon: Kerrin ChampagneJames E Nitka, MD;  [facility];  Service: Orthopedics;  Laterality: N/A;  . NASAL FRACTURE SURGERY    . NASAL SINUS SURGERY     "several times since 1959"  . RADIOLOGY WITH ANESTHESIA N/A 10/12/2017   Procedure: MRI WITH ANESTHESIA;  Surgeon: Radiologist, Medication, MD;  Location: [facility] OR;  Service: Radiology;  Laterality: N/A;  . SHOULDER OPEN ROTATOR CUFF REPAIR Bilateral   . TOTAL KNEE ARTHROPLASTY Right 2012  . TYMPANOSTOMY TUBE PLACEMENT Bilateral   . ULNAR NERVE TRANSPOSITION Left 05/14/2015   Procedure: LEFT ULNAR NERVE DECOMPRESSION AT THE ELBOW;  Surgeon: Kerrin ChampagneJames E Nitka, MD;  [facility];  Service: Orthopedics;  Laterality: Left;     Home Medications:  Prior to Admission  medications   Medication Sig Start Date End Date Taking? Authorizing Provider  amLODipine (NORVASC) 10 MG tablet 10 mg daily.  10/14/17  Yes [provider]  fluticasone (FLONASE) 50 MCG/ACT nasal spray Place 2 sprays into both nostrils as needed for allergies or rhinitis.   Yes [provider]  meloxicam (MOBIC) 7.5 MG tablet Take 7.5 mg by mouth daily. 11/28/17  Yes [provider]  metoprolol tartrate (LOPRESSOR) 50 MG tablet Take 50 mg by mouth 2 (two) times daily. 09/20/17  Yes [provider]  oxyCODONE-acetaminophen (PERCOCET/ROXICET) 5-325 MG tablet Take 1 tablet by mouth every 6 (six) hours as needed for severe pain. 11/13/17  Yes Naida Sleightwens, James M, PA-C  aspirin EC 81 MG tablet Take 81 mg by mouth daily.    [provider]  clopidogrel (PLAVIX) 75 MG tablet Take 75 mg by mouth daily.    [provider]  Coenzyme Q10 (COQ10 PO) Take 1 tablet by mouth daily.    [provider]  Cranberry 500 MG TABS Take 500 mg by  mouth daily.    [provider]  docusate sodium (COLACE) 100 MG capsule Take 200 mg by mouth 2 (two) times daily as needed for mild constipation.    [provider]  Evolocumab (REPATHA) 140 MG/ML SOSY Inject 140 mg into the skin every 14 (fourteen) days.    [provider]  gabapentin (NEURONTIN) 300 MG capsule Take 1 capsule (300 mg total) by mouth at bedtime for 7 days, THEN 1 capsule (300 mg total) 2 (two) times daily for 28 days. Patient taking differently: 1 capsule (300mg ) twice daily 10/31/17 12/05/17  Kerrin Champagne, MD  isosorbide mononitrate (IMDUR) 30 MG 24 hr tablet Take 30 mg by mouth daily. 09/05/17   [provider]  lisinopril (PRINIVIL,ZESTRIL) 5 MG tablet Take 5 mg by mouth daily.    [provider]  Multiple Vitamin (MULTIVITAMIN WITH MINERALS) TABS tablet Take 1 tablet by mouth daily.    [provider]  oxyCODONE (OXYCONTIN) 20 mg 12 hr tablet Take 1  tablet (20 mg total) by mouth every 12 (twelve) hours. 11/22/17   Kerrin Champagne, MD    Inpatient Medications: Scheduled Meds: . aspirin EC  81 mg Oral Daily  . clopidogrel  75 mg Oral Daily  . [START ON 12/05/2017] cosyntropin  0.25 mg Intravenous Once  . feeding supplement (ENSURE ENLIVE)  237 mL Oral BID BM  . gabapentin  300 mg Oral BID  . insulin aspart  0-5 Units Subcutaneous QHS  . insulin aspart  0-9 Units Subcutaneous TID WC  . polyethylene glycol  17 g Oral Daily  . sodium chloride flush  3 mL Intravenous Q12H   Continuous Infusions: . sodium chloride 75 mL/hr (12/04/17 0122)   PRN Meds: acetaminophen **OR** acetaminophen, bisacodyl, ondansetron **OR** ondansetron (ZOFRAN) IV, oxyCODONE  Allergies:    Allergies  Allergen Reactions  . Ezetimibe Other (See Comments)    Cramps  . Morphine And Related Other (See Comments)    Confusion  . Statins Other (See Comments)    Cramps    Social History:   Social History   Socioeconomic History  . Marital status: Married    Spouse name: Not on file  . Number of children: Not on file  . Years of education: Not on file  . Highest education level: Not on file  Occupational History  . Not on file  Social Needs  . Financial resource strain: Not on file  . Food insecurity:    Worry: Not on file    Inability: Not on file  . Transportation needs:    Medical: Not on file    Non-medical: Not on file  Tobacco Use  . Smoking status: Former Smoker    Packs/day: 1.00    Years: 2.00    Pack years: 2.00    Last attempt to quit: 01/09/1957    Years since quitting: 60.9  . Smokeless tobacco: Never Used  Substance and Sexual Activity  . Alcohol use: No  . Drug use: No  . Sexual activity: Not Currently  Lifestyle  . Physical activity:    Days per week: Not on file    Minutes per session: Not on file  . Stress: Not on file  Relationships  . Social connections:    Talks on phone: Not on file    Gets together: Not on file     Attends religious service: Not on file    Active member of club or organization: Not on file    Attends  meetings of clubs or organizations: Not on file    Relationship status: Not on file  . Intimate partner violence:    Fear of current or ex partner: Not on file    Emotionally abused: Not on file    Physically abused: Not on file    Forced sexual activity: Not on file  Other Topics Concern  . Not on file  Social History Narrative  . Not on file    Family History:    Family History  Problem Relation Age of Onset  . CAD Brother   . Diabetes Neg Hx      ROS:  Please see the history of present illness.   All other ROS reviewed and negative.     Physical Exam/Data:   Vitals:   12/04/17 0730 12/04/17 0800 12/04/17 0918 12/04/17 1154  BP:  (!) 107/55 (!) 107/55 127/61  Pulse:  79  84  Resp:  12  13  Temp: 98.3 F (36.8 C)     TempSrc:      SpO2:  98%  99%  Weight:      Height:        Intake/Output Summary (Last 24 hours) at 12/04/2017 1327 Last data filed at 12/04/2017 0600 Gross per 24 hour  Intake 2827.5 ml  Output -  Net 2827.5 ml   Filed Weights   12/03/17 1531  Weight: 204 lb (92.5 kg)   Body mass index is 26.19 kg/m.  General:  Well nourished, well developed gentleman laying in bed in no acute distress HEENT: sclera anicteric, MMM  Neck: no JVD Vascular: No carotid bruits; distal pulses 2+ bilaterally Cardiac:  normal S1, S2; RRR; +murmur heard best at RUSB; no gallops or rubs Lungs:  clear to auscultation bilaterally, no wheezing, rhonchi or rales  Abd: NABS, soft, nontender, no hepatomegaly Ext: no edema Musculoskeletal:  No deformities Skin: warm and dry  Neuro:  CNs 2-12 intact, no focal abnormalities noted Psych:  Normal affect   EKG:  The EKG was personally reviewed and demonstrates:  NSR, no STE/D, no TWI Telemetry:  Telemetry was personally reviewed and demonstrates:  NSR  Relevant CV Studies:  Cardiac catheterization 02/2017: Diagnostic  Procedure Summary Multivessel native CAD; all CTO's Patent LIMA /LAD and SVG/ramus grafts Severe SVG/OM disease with 3 focal stenoses of mid and distal graft body I have reviewed the recent history and physical documentation. I personally spent 50 minutes continuously monitoring the patient during the administration of moderate sedation. Pre and post activities have been reviewed. I was present for the entire procedure.  Cardiac Arteries and Lesion Findings LAD: Chronic occlusion. LCx: Chronic occlusion. RCA: Chronic occlusion.   Lesion on Mid RCA: Mid subsection.100% stenosis 30 mm length . Pre   procedure TIMI 0 flow was noted. Poor run off was present. Ramus: Chronic occlusion. Graft Lesions   Lesion on 1st Ob Marg to 1st Ob Marg: Middle body.90% stenosis 10 mm length   reduced to 0%. Pre procedure TIMI III flow was noted. Good run off was   present. The lesion was diagnosed as Moderate Risk (B).   Lesion on 1st Ob Marg to 1st Ob Marg: 90% stenosis 25 mm length reduced to   0%. Pre procedure TIMI II flow was noted. Poor run off was present. The   lesion was diagnosed as Moderate Risk (B).   Comments:tandem distal lesions; treated as one lesion with single 28 mm DES Cardiac Grafts  -  There is a Vein graft that originates  at the 1st Ob Marg and attaches to     the 1st Ob Marg.  -  There is a Vein graft that originates at the Aorta Left and attaches to     the Ramus.patent  -  There is a Vein graft that originates at the Aorta Right and attaches to     the R PDA.CTO  -  There is a LIMA graft that originates at the LIMA and attaches to the Mid     LAD.patent. prox subclavian also patent  Laboratory Data:  Chemistry Recent Labs  Lab 12/03/17 1533 12/04/17 0342  NA 137 136  K 4.4 4.1  CL 97* 103  CO2 24 25  GLUCOSE 137* 138*  BUN 24* 19  CREATININE 1.21 1.00  CALCIUM 9.1 9.1  GFRNONAA 54* >60  GFRAA >60 >60  ANIONGAP 16* 8    Recent Labs  Lab 12/03/17 1533  12/04/17 0342  PROT 6.8 5.7*  ALBUMIN 3.4* 2.9*  AST 25 18  ALT 16* 15*  ALKPHOS 165* 132*  BILITOT 0.5 0.6   Hematology Recent Labs  Lab 12/03/17 1533 12/04/17 0342  WBC 15.2* 11.5*  RBC 3.77* 3.32*  HGB 11.9* 10.3*  HCT 36.2* 31.9*  MCV 96.0 96.1  MCH 31.6 31.0  MCHC 32.9 32.3  RDW 12.7 12.7  PLT 414* 354   Cardiac Enzymes Recent Labs  Lab 12/03/17 2147 12/04/17 0342 12/04/17 0558  TROPONINI <0.03 <0.03 <0.03    Recent Labs  Lab 12/03/17 1631  TROPIPOC 0.02    BNPNo results for input(s): BNP, PROBNP in the last 168 hours.  DDimer No results for input(s): DDIMER in the last 168 hours.  Radiology/Studies:  Dg Abd 1 View  Result Date: 12/03/2017 CLINICAL DATA:  Constipation. EXAM: ABDOMEN - 1 VIEW COMPARISON:  CT angiography 3 hours prior. FINDINGS: Normal bowel gas pattern. No free intra-abdominal air. Small volume of colonic stool is seen on CT earlier this day. Excreted IV contrast within the urinary bladder. Linear density projecting over the upper abdomen is presumed external to the patient without correlate on CT earlier this day. IMPRESSION: Normal bowel gas pattern.  Small volume of colonic stool. Electronically Signed   By: Rubye Oaks M.D.   On: 12/03/2017 22:33   Ct Angio Chest/abd/pel For Dissection W And/or W/wo  Result Date: 12/03/2017 CLINICAL DATA:  Back pain. EXAM: CT ANGIOGRAPHY CHEST, ABDOMEN AND PELVIS TECHNIQUE: Multidetector CT imaging through the chest, abdomen and pelvis was performed using the standard protocol during bolus administration of intravenous contrast. Multiplanar reconstructed images and MIPs were obtained and reviewed to evaluate the vascular anatomy. CONTRAST:  ISOVUE-370 IOPAMIDOL (ISOVUE-370) INJECTION 76% COMPARISON:  CT scan of April 02, 2008. FINDINGS: CTA CHEST FINDINGS Cardiovascular: Atherosclerosis of thoracic aorta is noted without aneurysm or dissection. Status post coronary artery bypass graft. No pericardial  effusion is noted. Severe stenosis is noted at the origin of the right subclavian artery. Otherwise great vessels are widely patent. Mediastinum/Nodes: No enlarged mediastinal, hilar, or axillary lymph nodes. Thyroid gland, trachea, and esophagus demonstrate no significant findings. Lungs/Pleura: Lungs are clear. No pleural effusion or pneumothorax. Musculoskeletal: No chest wall abnormality. No acute or significant osseous findings. Review of the MIP images confirms the above findings. CTA ABDOMEN AND PELVIS FINDINGS VASCULAR Aorta: Atherosclerosis of abdominal aorta is noted without aneurysm or dissection. Celiac: Patent without evidence of aneurysm, dissection, vasculitis or significant stenosis. SMA: Patent without evidence of aneurysm, dissection, vasculitis or significant stenosis. Renals: Both renal arteries  are patent without evidence of aneurysm, dissection, vasculitis, fibromuscular dysplasia or significant stenosis. IMA: Patent without evidence of aneurysm, dissection, vasculitis or significant stenosis. Inflow: Patent without evidence of aneurysm, dissection, vasculitis or significant stenosis. Veins: No obvious venous abnormality within the limitations of this arterial phase study. Review of the MIP images confirms the above findings. NON-VASCULAR Hepatobiliary: No focal liver abnormality is seen. No gallstones, gallbladder wall thickening, or biliary dilatation. Pancreas: Unremarkable. No pancreatic ductal dilatation or surrounding inflammatory changes. Spleen: Normal in size without focal abnormality. Adrenals/Urinary Tract: Adrenal glands are unremarkable. Kidneys are normal, without renal calculi, focal lesion, or hydronephrosis. Bladder is unremarkable. Stomach/Bowel: Stomach is within normal limits. Appendix appears normal. No evidence of bowel wall thickening, distention, or inflammatory changes. Lymphatic: No significant adenopathy is noted. Reproductive: Prostate is unremarkable. Other: No  abdominal wall hernia or abnormality. No abdominopelvic ascites. Musculoskeletal: No acute or significant osseous findings. Review of the MIP images confirms the above findings. IMPRESSION: No evidence of thoracic or abdominal aortic dissection or aneurysm. Severe stenosis is noted at the origin of the right subclavian artery from the right innominate artery. No acute abnormality seen in the abdomen or pelvis. Electronically Signed   By: Lupita Raider, M.D.   On: 12/03/2017 19:31    Assessment and Plan:   1. Syncope: syncopal episode at orthopedic appointment in the setting of excruciating back pain. Also with poor po intake. He denies pre/post syncopal chest pain or palpitations. EKG without ischemic changes. Troponin negative x3. He does have a murmur c/w AS on exam, however do not suspect this is the cause of his syncope. Telemetry monitor without arrhythmia. Suspect vasovagal episode in the setting of acute pain. - Carotid US pending - Echocardiogram pending - Do not anticipate further work-up - Continue to monitor on telemetry   2. Hypotension: patient has been on steroids multiple times in the past several months and undergone steroid injections. Cortisol level low. C/f possible adrenal insufficiency. Medicine team planning for cortisol stimulation test in AM - Continue to hold antihypertensives - Adrenal insufficiency w/u per primary team  3. CAD s/p CABG with subsequent MI's: last cardiac cath 02/2017 (details above) with angioplasty presumably to OM graft. Without anginal complaints. EKG non-ischemic. Trop negative x3.  - Continue ASA if okay with orthopedics - Can resume repatha injections at discharge - Will hold plavix in anticipation of possible intervention later this week. Resume when cleared by orthopedics or if no surgery planned.   4. Chronic back pain: Followed by orthopedics. C/f new pathology on the L side. MRI of L spine with anesthesia in the works given claustrophobia.  Possible repeat surgical intervention if abnormalities on MRI. - Continue management per primary team and orthopedics  5. R subclavian artery stenosis: Noted on CT chest. Carotid dopplers pending.  - Continue BP checks on the L - Do not anticipate further work-up/intervention  6. DM type 2: Hgb A1c is 6.8; at goal of <7 - Continue current regimen  7. HLD: intolerant to statins; LDL 78 08/2017; goal <70 - Continue repatha at discharge  8. AKI: Cr 1.2 on admission > 1.0; baseline 0.87. Likely in the setting of poor po intake.  - Continue to monitor closely  9. Anemia: Hgb 10.3 today; baseline 12. Suspect somewhat dilutional in the setting of IVF resuscitation  - Continue to monitor closely   In anticipation of possible surgery later this week, preoperative evaluation is as follows:  Preoperative risk assessment: p/w history of CAD s/p  CABG with multiple PCI's. Has had intermittent typical angina over the past several months when he overexerts himself, however this is his usual angina and has not changed. He has been limited in mobility due to back/leg pain. His EKG is without ischemic changes and troponin negative x3. Last cath 02/2017 detailed above with multiple CTO of native vessels and grafts. Last DES placed 06/2016.  - Based on the revised cardiac risk index for preoperative risk, he has a score of 2 (ischemic heart disease and history of CVA), with a 10.1% 30-day risk of adverse cardiac event.  - Do not anticipate further ischemic evaluation will be necessary prior to surgery - Will hold plavix in anticipation of possible intervention later this week. Resume when cleared by orthopedics or if no surgery planned.     For questions or updates, please contact [facility] Please consult [email or web address] for contact info under Cardiology/STEMI.   Signed, Beatriz Stallion, PA-C  12/04/2017 1:27 PM [phone]  Personally seen and examined. Agree with above.  83 year old male with  syncopal episode during an episode of excruciating pain in the orthopedist office with stable coronary artery disease status post CABG with prior PCI's.  Last cardiac catheterization in July 2018 showing overall stability.  His last stent was placed in November 2017.  Overall he is feeling well laying in bed.  Occasional back pain.  Multiple scans personally reviewed.  Echocardiogram personally reviewed shows normal ejection fraction with calcified aortic valve but no significant stenosis.  Mild tricuspid regurgitation noted with mild pulmonary hypertension.  Exam: Alert and oriented x3 in no acute distress, moves all extremities, occasional neuropathic pain felt in lower extremity, heart regular rate and rhythm, 2/6 systolic murmur right upper sternal border, no edema, lungs are clear  Assessment and plan:  Syncope -Telemetry personally reviewed demonstrates no adverse arrhythmias, no pauses.  Sinus rhythm.  Reassuring. -Echocardiogram also reassuring showing normal EF. -He has had a prior syncopal episode in the setting of excruciating pain surrounding a kidney stone he states.  It is possible that syncope occurred as a result of a hyper vagal response in conjunction with iatrogenic hypotension as well. -I agree with holding his antihypertensives.  Blood pressure currently stable.  Agreed with IV fluid administration. -Cosyntropin stim test has been ordered.  Excluding Addison's disease.  Coronary artery disease status post CABG -Cardiac catheterizations previously reviewed.  Last DES was in 2017.  If he must undergo back surgery, I feel comfortable at this point holding his Plavix.  We will continue with low-dose aspirin 81 mg however if Dr. Otelia Sergeant feels as though this should be discontinued as well, this should be fine.  Stable CAD.  Right subclavian stenosis - I would not think that this is the reason behind his syncope, i.e. it would be highly unlikely that he is demonstrating subclavian  steal especially if he is not exercising his right arm during the syncopal episode.  Continue with medical management.  No evidence of vascular compromise in his distal extremities.  Preoperative risk assessment - He would be moderate risk for surgery based upon his previous cardiovascular events, post CABG.  Currently stable.  No significant valvular disease.  Syncopal episode likely hyper vagal/vasodilatory.  Continue with holding antihypertensives.  He may proceed if necessary on Friday.   Donato Schultz, MD

## 2017-12-04 NOTE — Progress Notes (Addendum)
MD paged [phone] as patient asking for additional pain meds--continues to c/o shooting pain LLE 9/10.  Second page to MD 1400. MD to place orders.

## 2017-12-04 NOTE — Progress Notes (Signed)
Initial Nutrition Assessment  DOCUMENTATION CODES:   Not applicable  INTERVENTION:  Continue Ensure Enlive po BID, each supplement provides 350 kcal and 20 grams of protein.  Encourage adequate PO intake.   NUTRITION DIAGNOSIS:   Increased nutrient needs related to acute illness as evidenced by estimated needs.  GOAL:   Patient will meet greater than or equal to 90% of their needs  MONITOR:   PO intake, Supplement acceptance, Labs, Weight trends, I & O's, Skin  REASON FOR ASSESSMENT:   Malnutrition Screening Tool    ASSESSMENT:   83 year old male with history of coronary artery disease with prior MIs status post CABG, prior CVA, diabetes mellitus type 2, hypertension presents with syncope and hypotension.   Pt unavailable during time of visit. RD unable to obtain nutrition history. Noted pt with a 10% weight loss in 2 months per weight records. Meal completion has been 100%. Per MD note, pt reports poor po with decreased appetite. Pt currently has Ensure ordered and has been consuming them. RD to continue with current orders.    Unable to complete Nutrition-Focused physical exam at this time.   Labs and medications reviewed. Magnesium low at 1.6. Alkaline phosphatase elevated at 132.   Diet Order:  Diet Carb Modified Fluid consistency: Thin; Room service appropriate? Yes  EDUCATION NEEDS:   Not appropriate for education at this time  Skin:  Skin Assessment: Reviewed RN Assessment  Last BM:  4/6  Height:   Ht Readings from Last 1 Encounters:  12/03/17 6\' 2"  (1.88 m)    Weight:   Wt Readings from Last 1 Encounters:  12/03/17 204 lb (92.5 kg)    Ideal Body Weight:  86.36 kg  BMI:  Body mass index is 26.19 kg/m.  Estimated Nutritional Needs:   Kcal:  2050-2300  Protein:  90-100 grams  Fluid:  >/= 2 L/day    Roslyn SmilingStephanie Javon Snee, MS, RD, LDN Pager # [phone] After hours/ weekend pager # [phone]

## 2017-12-04 NOTE — Care Management Obs Status (Signed)
MEDICARE OBSERVATION STATUS NOTIFICATION   Patient Details  Name: Jeffrey Nelson MRN: [id number] Date of Birth: 1934/12/25   Medicare Observation Status Notification Given:  Yes    Elliot CousinShavis, Eyva Califano Ellen, RN 12/04/2017, 5:44 PM

## 2017-12-04 NOTE — Care Management Note (Signed)
Case Management Note  Patient Details  Name: Jeffrey Nelson MRN: [id number] Date of Birth: December 16, 1934  Subjective/Objective:   Severe back pain s/p microdiscectomy in 09/2017                 Action/Plan: NCM spoke to pt and wife at bedside. Pt has RW and 3n1 bedside commode at home. [facility]. Contacted [facility] to make aware of admission. Will need a resumption of care for HHPT. Waiting PT/OT recommendations. Will be SNF vs HH.    Expected Discharge Date:                  Expected Discharge Plan:  Home w Home Health Services  In-House Referral:  Clinical Social Work  Discharge planning Services  CM Consult  Post Acute Care Choice:  Home Health, Resumption of Svcs/PTA Provider Choice offered to:  Patient, Spouse  DME Arranged:  N/A DME Agency:  NA  HH Arranged:  PT HH Agency:  [facility]  Status of Service:  In process, will continue to follow  If discussed at Long Length of Stay Meetings, dates discussed:    Additional Comments:  Elliot CousinShavis, Evelina Lore Ellen, RN 12/04/2017, 10:27 AM

## 2017-12-04 NOTE — Progress Notes (Signed)
PROGRESS NOTE  Jeffrey Nelson ZOX:[id number] DOB: 10-11-1934 DOA: 12/03/2017 PCP: Patient, No Pcp Per   LOS: 0 days   Brief Narrative / Interim history: This is an 83 year old male with history of coronary artery disease with prior MIs status post CABG, prior CVA, diabetes mellitus type 2, hypertension, who went to his orthopedic surgery office yesterday for excruciating severe back pain, and while in office patient felt lightheaded, dizzy and passed out.  He was found to have significant hypotension and was sent to the emergency room.  In the ED he was found to be orthostatic, he was given IV fluids and was admitted to the hospital.  Assessment & Plan: Active Problems:   Diabetes mellitus type 2, uncomplicated (HCC)   Status post lumbar laminectomy   Syncope   Dehydration   Chronic back pain   Hypotension   Subclavian artery stenosis, right (HCC)   Syncopal episode -Patient with significant hypotension in the orthopedics office, he states that he has been having poor p.o. intake, may be related to dehydration, give IV fluids and repeat orthostatics this afternoon and tomorrow morning -patient without any chest pain/symptoms/palpitations, troponin has remained negative -2D echo pending, night team consulted cardiology  Hypotension -Patient has been receiving steroids in the past and has had steroid injections, cortisol was checked this morning and it was found to be decreased to 2, suggesting possible adrenal insufficiency, will obtain a stim test on 4/10 in the morning  Chronic back pain -Discussed with Dr. Otelia Sergeant over the phone, may have new pathology on the left side, will obtain an MRI of the L-spine, given prior difficulties with obtaining MRIs due to claustrophobia recommending general anesthesia  -PT evaluation pending, continue home pain regimen  Subclavian artery stenosis, right -please check blood pressures on the left  Type 2 diabetes mellitus -Continue sliding scale,  hemoglobin A1c less than 7 which shows good control -Continue gabapentin  Coronary artery disease -No chest pain, no cardiac symptoms, continue aspirin, Plavix   DVT prophylaxis: SCDs Code Status: Full code Family Communication: No family at bedside Disposition Plan: To be determined based on the MRI of the L-spine, Centrum and stim test  Consultants:   Orthopedic surgery  Cardiology   Procedures:   None   Antimicrobials:  None    Subjective: -Complains of severe back pain, laying in bed, no chest pain, no shortness of breath, no abdominal pain, nausea or vomiting  Objective: Vitals:   12/04/17 0400 12/04/17 0730 12/04/17 0800 12/04/17 0918  BP:   (!) 107/55 (!) 107/55  Pulse: 84  79   Resp: (!) 27  12   Temp:  98.3 F (36.8 C)    TempSrc:      SpO2: 99%  98%   Weight:      Height:        Intake/Output Summary (Last 24 hours) at 12/04/2017 1146 Last data filed at 12/04/2017 0600 Gross per 24 hour  Intake 2827.5 ml  Output -  Net 2827.5 ml   Filed Weights   12/03/17 1531  Weight: 92.5 kg (204 lb)    Examination:  Constitutional: NAD Eyes: lids and conjunctivae normal ENMT: Mucous membranes are moist.  Respiratory: clear to auscultation bilaterally, no wheezing, no crackles. Normal respiratory effort.  Cardiovascular: Regular rate and rhythm, no murmurs / rubs / gallops. No LE edema. 2+ pedal pulses. .  Abdomen: no tenderness. Bowel sounds positive.  Musculoskeletal: no clubbing / cyanosis.  Skin: no rashes, lesions, ulcers. No induration Neurologic:  CN 2-12 grossly intact. Strength 5/5 in all 4.    Data Reviewed: I have independently reviewed following labs and imaging studies   CBC: Recent Labs  Lab 12/03/17 1533 12/04/17 0342  WBC 15.2* 11.5*  NEUTROABS 11.9*  --   HGB 11.9* 10.3*  HCT 36.2* 31.9*  MCV 96.0 96.1  PLT 414* 354   Basic Metabolic Panel: Recent Labs  Lab 12/03/17 1533 12/04/17 0342  NA 137 136  K 4.4 4.1  CL 97* 103    CO2 24 25  GLUCOSE 137* 138*  BUN 24* 19  CREATININE 1.21 1.00  CALCIUM 9.1 9.1  MG  --  1.6*  PHOS  --  3.2   GFR: Estimated Creatinine Clearance: 67.4 mL/min (by C-G formula based on SCr of 1 mg/dL). Liver Function Tests: Recent Labs  Lab 12/03/17 1533 12/04/17 0342  AST 25 18  ALT 16* 15*  ALKPHOS 165* 132*  BILITOT 0.5 0.6  PROT 6.8 5.7*  ALBUMIN 3.4* 2.9*   No results for input(s): LIPASE, AMYLASE in the last 168 hours. No results for input(s): AMMONIA in the last 168 hours. Coagulation Profile: No results for input(s): INR, PROTIME in the last 168 hours. Cardiac Enzymes: Recent Labs  Lab 12/03/17 2147 12/04/17 0342 12/04/17 0558  TROPONINI <0.03 <0.03 <0.03   BNP (last 3 results) No results for input(s): PROBNP in the last 8760 hours. HbA1C: Recent Labs    12/04/17 0342  HGBA1C 6.8*   CBG: Recent Labs  Lab 12/03/17 1604 12/04/17 0457 12/04/17 0739  GLUCAP 133* 128* 102*   Lipid Profile: No results for input(s): CHOL, HDL, LDLCALC, TRIG, CHOLHDL, LDLDIRECT in the last 72 hours. Thyroid Function Tests: Recent Labs    12/04/17 0342  TSH 1.607   Anemia Panel: No results for input(s): VITAMINB12, FOLATE, FERRITIN, TIBC, IRON, RETICCTPCT in the last 72 hours. Urine analysis:    Component Value Date/Time   COLORURINE YELLOW 12/03/2017 1937   APPEARANCEUR CLEAR 12/03/2017 1937   LABSPEC 1.040 (H) 12/03/2017 1937   PHURINE 5.0 12/03/2017 1937   GLUCOSEU NEGATIVE 12/03/2017 1937   HGBUR NEGATIVE 12/03/2017 1937   BILIRUBINUR NEGATIVE 12/03/2017 1937   KETONESUR NEGATIVE 12/03/2017 1937   PROTEINUR NEGATIVE 12/03/2017 1937   UROBILINOGEN 0.2 05/07/2015 1345   NITRITE NEGATIVE 12/03/2017 1937   LEUKOCYTESUR NEGATIVE 12/03/2017 1937   Sepsis Labs: Invalid input(s): PROCALCITONIN, LACTICIDVEN  Recent Results (from the past 240 hour(s))  MRSA PCR Screening     Status: None   Collection Time: 12/04/17  2:24 AM  Result Value Ref Range Status    MRSA by PCR NEGATIVE NEGATIVE Final    Comment:        The GeneXpert MRSA Assay (FDA approved for NASAL specimens only), is one component of a comprehensive MRSA colonization surveillance program. It is not intended to diagnose MRSA infection nor to guide or monitor treatment for MRSA infections. Performed at [facility], [address], [place], [place] [address]     Radiology Studies: Dg Abd 1 View  Result Date: 12/03/2017 CLINICAL DATA:  Constipation. EXAM: ABDOMEN - 1 VIEW COMPARISON:  CT angiography 3 hours prior. FINDINGS: Normal bowel gas pattern. No free intra-abdominal air. Small volume of colonic stool is seen on CT earlier this day. Excreted IV contrast within the urinary bladder. Linear density projecting over the upper abdomen is presumed external to the patient without correlate on CT earlier this day. IMPRESSION: Normal bowel gas pattern.  Small volume of colonic stool.  Electronically Signed   By: Rubye OaksMelanie  Ehinger M.D.   On: 12/03/2017 22:33   Ct Angio Chest/abd/pel For Dissection W And/or W/wo  Result Date: 12/03/2017 CLINICAL DATA:  Back pain. EXAM: CT ANGIOGRAPHY CHEST, ABDOMEN AND PELVIS TECHNIQUE: Multidetector CT imaging through the chest, abdomen and pelvis was performed using the standard protocol during bolus administration of intravenous contrast. Multiplanar reconstructed images and MIPs were obtained and reviewed to evaluate the vascular anatomy. CONTRAST:  100mL ISOVUE-370 IOPAMIDOL (ISOVUE-370) INJECTION 76% COMPARISON:  CT scan of April 02, 2008. FINDINGS: CTA CHEST FINDINGS Cardiovascular: Atherosclerosis of thoracic aorta is noted without aneurysm or dissection. Status post coronary artery bypass graft. No pericardial effusion is noted. Severe stenosis is noted at the origin of the right subclavian artery. Otherwise great vessels are widely patent. Mediastinum/Nodes: No enlarged mediastinal, hilar, or axillary lymph nodes. Thyroid gland, trachea, and  esophagus demonstrate no significant findings. Lungs/Pleura: Lungs are clear. No pleural effusion or pneumothorax. Musculoskeletal: No chest wall abnormality. No acute or significant osseous findings. Review of the MIP images confirms the above findings. CTA ABDOMEN AND PELVIS FINDINGS VASCULAR Aorta: Atherosclerosis of abdominal aorta is noted without aneurysm or dissection. Celiac: Patent without evidence of aneurysm, dissection, vasculitis or significant stenosis. SMA: Patent without evidence of aneurysm, dissection, vasculitis or significant stenosis. Renals: Both renal arteries are patent without evidence of aneurysm, dissection, vasculitis, fibromuscular dysplasia or significant stenosis. IMA: Patent without evidence of aneurysm, dissection, vasculitis or significant stenosis. Inflow: Patent without evidence of aneurysm, dissection, vasculitis or significant stenosis. Veins: No obvious venous abnormality within the limitations of this arterial phase study. Review of the MIP images confirms the above findings. NON-VASCULAR Hepatobiliary: No focal liver abnormality is seen. No gallstones, gallbladder wall thickening, or biliary dilatation. Pancreas: Unremarkable. No pancreatic ductal dilatation or surrounding inflammatory changes. Spleen: Normal in size without focal abnormality. Adrenals/Urinary Tract: Adrenal glands are unremarkable. Kidneys are normal, without renal calculi, focal lesion, or hydronephrosis. Bladder is unremarkable. Stomach/Bowel: Stomach is within normal limits. Appendix appears normal. No evidence of bowel wall thickening, distention, or inflammatory changes. Lymphatic: No significant adenopathy is noted. Reproductive: Prostate is unremarkable. Other: No abdominal wall hernia or abnormality. No abdominopelvic ascites. Musculoskeletal: No acute or significant osseous findings. Review of the MIP images confirms the above findings. IMPRESSION: No evidence of thoracic or abdominal aortic  dissection or aneurysm. Severe stenosis is noted at the origin of the right subclavian artery from the right innominate artery. No acute abnormality seen in the abdomen or pelvis. Electronically Signed   By: Lupita RaiderJames  Green Jr, M.D.   On: 12/03/2017 19:31    Scheduled Meds: . aspirin EC  81 mg Oral Daily  . clopidogrel  75 mg Oral Daily  . [START ON 12/05/2017] cosyntropin  0.25 mg Intravenous Once  . feeding supplement (ENSURE ENLIVE)  237 mL Oral BID BM  . gabapentin  300 mg Oral BID  . insulin aspart  0-5 Units Subcutaneous QHS  . insulin aspart  0-9 Units Subcutaneous TID WC  . polyethylene glycol  17 g Oral Daily  . sodium chloride flush  3 mL Intravenous Q12H   Continuous Infusions: . sodium chloride 75 mL/hr (12/04/17 0122)   Pamella Pertostin Gherghe, MD, PhD [facility] Pager [phone]  If 7PM-7AM, please contact night-coverage [email or web address] Password [email or web address] 12/04/2017, 11:46 AM

## 2017-12-05 ENCOUNTER — Observation Stay (HOSPITAL_COMMUNITY): Payer: Medicare HMO | Admitting: Anesthesiology

## 2017-12-05 ENCOUNTER — Observation Stay (HOSPITAL_COMMUNITY): Payer: Medicare HMO

## 2017-12-05 ENCOUNTER — Encounter (HOSPITAL_COMMUNITY): Payer: Self-pay | Admitting: Surgery

## 2017-12-05 ENCOUNTER — Encounter (HOSPITAL_COMMUNITY)
Admission: EM | Disposition: A | Discharge status: Home / Self Care | Payer: Self-pay | Source: Home / Self Care | Attending: Internal Medicine

## 2017-12-05 DIAGNOSIS — M5440 Lumbago with sciatica, unspecified side: Secondary | ICD-10-CM | POA: Diagnosis not present

## 2017-12-05 DIAGNOSIS — G8929 Other chronic pain: Secondary | ICD-10-CM | POA: Diagnosis not present

## 2017-12-05 DIAGNOSIS — I9589 Other hypotension: Secondary | ICD-10-CM | POA: Diagnosis not present

## 2017-12-05 DIAGNOSIS — R55 Syncope and collapse: Secondary | ICD-10-CM | POA: Diagnosis not present

## 2017-12-05 DIAGNOSIS — Z9889 Other specified postprocedural states: Secondary | ICD-10-CM | POA: Diagnosis not present

## 2017-12-05 HISTORY — PX: RADIOLOGY WITH ANESTHESIA: SHX6223

## 2017-12-05 LAB — BASIC METABOLIC PANEL
ANION GAP: 9 (ref 5–15)
BUN: 12 mg/dL (ref 6–20)
CALCIUM: 9.2 mg/dL (ref 8.9–10.3)
CO2: 26 mmol/L (ref 22–32)
Chloride: 102 mmol/L (ref 101–111)
Creatinine, Ser: 0.83 mg/dL (ref 0.61–1.24)
GFR calc non Af Amer: >60 mL/min (ref >60)
Glucose, Bld: 126 mg/dL — ABNORMAL HIGH (ref 65–99)
Potassium: 4.1 mmol/L (ref 3.5–5.1)
SODIUM: 137 mmol/L (ref 135–145)

## 2017-12-05 LAB — ACTH STIMULATION, 3 TIME POINTS
CORTISOL 30 MIN: 18.5 ug/dL
CORTISOL 60 MIN: 21.9 ug/dL
CORTISOL BASE: 5.6 ug/dL

## 2017-12-05 LAB — GLUCOSE, CAPILLARY
GLUCOSE-CAPILLARY: 125 mg/dL — AB (ref 65–99)
GLUCOSE-CAPILLARY: 195 mg/dL — AB (ref 65–99)
GLUCOSE-CAPILLARY: 168 mg/dL — AB (ref 65–99)
Glucose-Capillary: 120 mg/dL — ABNORMAL HIGH (ref 65–99)
Glucose-Capillary: 108 mg/dL — ABNORMAL HIGH (ref 65–99)
Glucose-Capillary: 134 mg/dL — ABNORMAL HIGH (ref 65–99)
Glucose-Capillary: 121 mg/dL — ABNORMAL HIGH (ref 65–99)

## 2017-12-05 LAB — CBC
HCT: 32.7 % — ABNORMAL LOW (ref 39.0–52.0)
HEMOGLOBIN: 10.8 g/dL — AB (ref 13.0–17.0)
MCH: 32 pg (ref 26.0–34.0)
MCHC: 33 g/dL (ref 30.0–36.0)
MCV: 97 fL (ref 78.0–100.0)
Platelets: 349 10*3/uL (ref 150–400)
RBC: 3.37 MIL/uL — AB (ref 4.22–5.81)
RDW: 13 % (ref 11.5–15.5)
WBC: 9 10*3/uL (ref 4.0–10.5)

## 2017-12-05 LAB — CORTISOL-AM, BLOOD: CORTISOL - AM: 5.7 ug/dL — AB (ref 6.7–22.6)

## 2017-12-05 SURGERY — MRI WITH ANESTHESIA
Anesthesia: General

## 2017-12-05 MED ORDER — GABAPENTIN 300 MG PO CAPS
300.0000 mg | ORAL_CAPSULE | Freq: Three times a day (TID) | ORAL | Status: DC
  Administered 2017-12-05 – 2017-12-09 (×11): 300 mg via ORAL
  Filled 2017-12-05 (×11): qty 1

## 2017-12-05 MED ORDER — GADOBENATE DIMEGLUMINE 529 MG/ML IV SOLN
20.0000 mL | Freq: Once | INTRAVENOUS | Status: AC
  Administered 2017-12-05: 19 mL via INTRAVENOUS

## 2017-12-05 NOTE — Anesthesia Postprocedure Evaluation (Signed)
Anesthesia Post Note  Patient: Jeffrey Nelson  Procedure(s) Performed: MRI WITH ANESTHESIA (N/A )     Patient location during evaluation: PACU Anesthesia Type: General Level of consciousness: awake Pain management: pain level controlled Vital Signs Assessment: post-procedure vital signs reviewed and stable Respiratory status: spontaneous breathing Cardiovascular status: stable Postop Assessment: no apparent nausea or vomiting Anesthetic complications: no    Last Vitals:  Vitals:   12/05/17 1309 12/05/17 1310  BP:    Pulse:    Resp: 12 13  Temp: 36.6 C   SpO2:      Last Pain:  Vitals:   12/05/17 1309  TempSrc:   PainSc: 0-No pain   Pain Goal: Patients Stated Pain Goal: 4 (12/05/17 0639)               Derron Pipkins JR,JOHN Susann GivensFRANKLIN

## 2017-12-05 NOTE — Anesthesia Procedure Notes (Signed)
Procedure Name: Intubation Date/Time: 12/05/2017 11:40 AM Performed by: Coralee RudFlores, Clara Smolen, CRNA Pre-anesthesia Checklist: Patient identified, Emergency Drugs available, Suction available and Patient being monitored Patient Re-evaluated:Patient Re-evaluated prior to induction Oxygen Delivery Method: Circle system utilized Preoxygenation: Pre-oxygenation with 100% oxygen Induction Type: IV induction Ventilation: Mask ventilation without difficulty and Oral airway inserted - appropriate to patient size Laryngoscope Size: Miller and 3 Grade View: Grade II Tube type: Oral Tube size: 7.5 mm Number of attempts: 2 Airway Equipment and Method: Bougie stylet Placement Confirmation: ETT inserted through vocal cords under direct vision,  positive ETCO2 and breath sounds checked- equal and bilateral (Only arytenoids visualized.) Secured at: 22 cm Tube secured with: Tape Dental Injury: Teeth and Oropharynx as per pre-operative assessment  Future Recommendations: Recommend- induction with short-acting agent, and alternative techniques readily available

## 2017-12-05 NOTE — Anesthesia Preprocedure Evaluation (Addendum)
Anesthesia Evaluation  Patient identified by MRN, date of birth, ID band Patient awake    Reviewed: Allergy & Precautions, NPO status , Patient's Chart, lab work & pertinent test results  History of Anesthesia Complications Negative for: history of anesthetic complications  Airway Mallampati: I  TM Distance: >3 FB Neck ROM: Full    Dental  (+) Caps, Dental Advisory Given   Pulmonary former smoker,    breath sounds clear to auscultation       Cardiovascular hypertension, Pt. on medications and Pt. on home beta blockers (-) angina+ CAD, + Past MI, + Cardiac Stents and + CABG   Rhythm:Regular Rate:Normal  '18 cath: grafts patent, stents patent '17 EF 49%   Neuro/Psych CVA (vision changes), Residual Symptoms    GI/Hepatic negative GI ROS, Neg liver ROS,   Endo/Other  diabetes  Renal/GU negative Renal ROS     Musculoskeletal  (+) Arthritis , Osteoarthritis,    Abdominal   Peds  Hematology plavix   Anesthesia Other Findings   Reproductive/Obstetrics                             Anesthesia Physical  Anesthesia Plan  ASA: III  Anesthesia Plan: General   Post-op Pain Management:    Induction: Intravenous  PONV Risk Score and Plan: 3 and Ondansetron, Dexamethasone and Treatment may vary due to age or medical condition  Airway Management Planned: Oral ETT  Additional Equipment:   Intra-op Plan:   Post-operative Plan: Extubation in OR  Informed Consent: I have reviewed the patients History and Physical, chart, labs and discussed the procedure including the risks, benefits and alternatives for the proposed anesthesia with the patient or authorized representative who has indicated his/her understanding and acceptance.   Dental advisory given  Plan Discussed with: CRNA and Surgeon  Anesthesia Plan Comments: (Plan routine monitors, GETA again; last GA in 3-19 easy ETT; 130 propofol)       Anesthesia Quick Evaluation

## 2017-12-05 NOTE — Progress Notes (Signed)
PROGRESS NOTE    Jeffrey Nelson  WUJ:[id number] DOB: April 11, 1935 DOA: 12/03/2017 PCP: Deloris Ping, MD   Brief Narrative: This is an 83 year old male with history of coronary artery disease with prior MIs status post CABG, prior CVA, diabetes mellitus type 2, hypertension, who went to his orthopedic surgery office yesterday for excruciating severe back pain, and while in office patient felt lightheaded, dizzy and passed out.  He was found to have significant hypotension and was sent to the emergency room.  In the ED he was found to be orthostatic, he was given IV fluids and was admitted to the hospital.    Assessment & Plan:   Active Problems:   Diabetes mellitus type 2, uncomplicated (HCC)   Status post lumbar laminectomy   Syncope   Dehydration   Chronic back pain   Hypotension   Subclavian artery stenosis, right (HCC)   Syncopal episode -Patient with significant hypotension in the orthopedics office, he states that he has been having poor p.o. intake, may be related to dehydration, -patient without any chest pain/symptoms/palpitations, troponin has remained negative -Echo showed normal ejection fraction. -Currently his blood pressure stable.We will check orthostatic BP  Hypotension -Patient has been receiving steroids in the past and has had steroid injections, cortisol was checked and it was found to be decreased to 2.  -Cosyntropin stimulation test done this morning with improvement of cortisone level.  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 Will consider on starting hydrocortisone which will be tapered appropriately. -Antihypertensives on hold.  Chronic back pain -H/O previous microdiscectomies -Orthopedics following -As per Ortho,  MRI of the L-spine obtained: Result: 1. Status post interval LEFT L3-4 and suspected L4-5 redo laminectomies. Exuberant discogenic endplate changes L3-4, less likely discitis osteomyelitis. 2. Recent RIGHT L3-4 hemilaminectomy. Stable 6 x  9 mm fluid collection within RIGHT surgical bed equivocal for abscess, however there is new enhancing RIGHT L3-4 facet effusion and mild widening may be infectious or inflammatory. 3. New 0.9 x 2.2 x 4.9 cm subacute hematoma at L5-S1 anteriorly displacing the cauda equina. 4. No canal stenosis. L2-3 through L4-5 neural foraminal narrowing: Severe at L3-4  -Waiting for further input from Ortho -PT evaluation pending, continue home pain regimen  Subclavian artery stenosis, right -We will check blood pressures on the left  Type 2 diabetes mellitus -Continue sliding scale, hemoglobin A1c less than 7 which shows good control -Continue gabapentin  Coronary artery disease -No chest pain, no cardiac symptoms, continue aspirin, Plavix     DVT prophylaxis: SCD Code Status: Full Family Communication: Wife present at the bedside Disposition Plan: To be determined   Consultants: Orthopedic surgery, cardiology  Procedures: None  Antimicrobials: None  Subjective: Patient seen and examined the bedside this afternoon.  Came back from MRI.  Continues to complain of severe back pain.  Objective: Vitals:   12/05/17 1354 12/05/17 1400 12/05/17 1406 12/05/17 1425  BP: 138/85   (!) 137/58  Pulse: 87 85 95 79  Resp: 14 (!) 7 (!) 21 12  Temp:  97.7 F (36.5 C)  98.2 F (36.8 C)  TempSrc:    Oral  SpO2: 96% 99% 98% 95%  Weight:      Height:        Intake/Output Summary (Last 24 hours) at 12/05/2017 1636 Last data filed at 12/05/2017 1500 Gross per 24 hour  Intake 1300 ml  Output 2750 ml  Net -1450 ml   Filed Weights   12/03/17 1531 12/05/17 0621  Weight: 92.5 kg (204 lb)  93.2 kg (205 lb 7.5 oz)    Examination:  General exam: Appears calm and comfortable ,Not in distress,average built HEENT:PERRL,Oral mucosa moist, Ear/Nose normal on gross exam Respiratory system: Bilateral equal air entry, normal vesicular breath sounds, no wheezes or crackles  Cardiovascular system: S1  & S2 heard, RRR. No JVD, murmurs, rubs, gallops or clicks. No pedal edema. Gastrointestinal system: Abdomen is nondistended, soft and nontender. No organomegaly or masses felt. Normal bowel sounds heard. Central nervous system: Alert and oriented. No focal neurological deficits. Extremities: No edema, no clubbing ,no cyanosis, distal peripheral pulses palpable. Skin: No rashes, lesions or ulcers,no icterus ,no pallor MSK: Normal muscle bulk,tone ,power Psychiatry: Judgement and insight appear normal. Mood & affect appropriate.     Data Reviewed: I have personally reviewed following labs and imaging studies  CBC: Recent Labs  Lab 12/03/17 1533 12/04/17 0342 12/05/17 0356  WBC 15.2* 11.5* 9.0  NEUTROABS 11.9*  --   --   HGB 11.9* 10.3* 10.8*  HCT 36.2* 31.9* 32.7*  MCV 96.0 96.1 97.0  PLT 414* 354 349   Basic Metabolic Panel: Recent Labs  Lab 12/03/17 1533 12/04/17 0342 12/05/17 0356  NA 137 136 137  K 4.4 4.1 4.1  CL 97* 103 102  CO2 24 25 26   GLUCOSE 137* 138* 126*  BUN 24* 19 12  CREATININE 1.21 1.00 0.83  CALCIUM 9.1 9.1 9.2  MG  --  1.6*  --   PHOS  --  3.2  --    GFR: Estimated Creatinine Clearance: 81.2 mL/min (by C-G formula based on SCr of 0.83 mg/dL). Liver Function Tests: Recent Labs  Lab 12/03/17 1533 12/04/17 0342  AST 25 18  ALT 16* 15*  ALKPHOS 165* 132*  BILITOT 0.5 0.6  PROT 6.8 5.7*  ALBUMIN 3.4* 2.9*   No results for input(s): LIPASE, AMYLASE in the last 168 hours. No results for input(s): AMMONIA in the last 168 hours. Coagulation Profile: No results for input(s): INR, PROTIME in the last 168 hours. Cardiac Enzymes: Recent Labs  Lab 12/03/17 2147 12/04/17 0342 12/04/17 0558  TROPONINI <0.03 <0.03 <0.03   BNP (last 3 results) No results for input(s): PROBNP in the last 8760 hours. HbA1C: Recent Labs    12/04/17 0342  HGBA1C 6.8*   CBG: Recent Labs  Lab 12/04/17 2117 12/05/17 0452 12/05/17 0734 12/05/17 1023  12/05/17 1311  GLUCAP 123* 125* 108* 134* 121*   Lipid Profile: No results for input(s): CHOL, HDL, LDLCALC, TRIG, CHOLHDL, LDLDIRECT in the last 72 hours. Thyroid Function Tests: Recent Labs    12/04/17 0342  TSH 1.607   Anemia Panel: No results for input(s): VITAMINB12, FOLATE, FERRITIN, TIBC, IRON, RETICCTPCT in the last 72 hours. Sepsis Labs: Recent Labs  Lab 12/03/17 1654 12/03/17 1955  LATICACIDVEN 1.96* 1.13    Recent Results (from the past 240 hour(s))  Culture, blood (routine x 2)     Status: None (Preliminary result)   Collection Time: 12/03/17  6:00 PM  Result Value Ref Range Status   Specimen Description BLOOD RIGHT ANTECUBITAL  Final   Special Requests   Final    BOTTLES DRAWN AEROBIC AND ANAEROBIC Blood Culture adequate volume   Culture   Final    NO GROWTH 2 DAYS Performed at [facility], [address] [place], [place] [address]    Report Status PENDING  Incomplete  Culture, blood (routine x 2)     Status: None (Preliminary result)   Collection Time: 12/03/17  6:15 PM  Result Value Ref Range Status   Specimen Description BLOOD LEFT WRIST  Final   Special Requests   Final    BOTTLES DRAWN AEROBIC AND ANAEROBIC Blood Culture adequate volume   Culture   Final    NO GROWTH 2 DAYS Performed at [facility], [address], [place], [place] [address]    Report Status PENDING  Incomplete  MRSA PCR Screening     Status: None   Collection Time: 12/04/17  2:24 AM  Result Value Ref Range Status   MRSA by PCR NEGATIVE NEGATIVE Final    Comment:        The GeneXpert MRSA Assay (FDA approved for NASAL specimens only), is one component of a comprehensive MRSA colonization surveillance program. It is not intended to diagnose MRSA infection nor to guide or monitor treatment for MRSA infections. Performed at [facility], [address], [place], [place] [address]          Radiology Studies: Dg Abd 1 View  Result Date:  12/03/2017 CLINICAL DATA:  Constipation. EXAM: ABDOMEN - 1 VIEW COMPARISON:  CT angiography 3 hours prior. FINDINGS: Normal bowel gas pattern. No free intra-abdominal air. Small volume of colonic stool is seen on CT earlier this day. Excreted IV contrast within the urinary bladder. Linear density projecting over the upper abdomen is presumed external to the patient without correlate on CT earlier this day. IMPRESSION: Normal bowel gas pattern.  Small volume of colonic stool. Electronically Signed   By: Rubye Oaks M.D.   On: 12/03/2017 22:33   Mr Lumbar Spine W Wo Contrast  Result Date: 12/05/2017 CLINICAL DATA:  Extreme LEFT leg pain. Status post lumbar laminectomy November 12, 2017 and October 13, 2017. EXAM: MRI LUMBAR SPINE WITHOUT AND WITH CONTRAST TECHNIQUE: Multiplanar and multiecho pulse sequences of the lumbar spine were obtained without and with intravenous contrast. CONTRAST:  19mL MULTIHANCE GADOBENATE DIMEGLUMINE 529 MG/ML IV SOLN COMPARISON:  MRI lumbar spine November 11, 2017 FINDINGS: SEGMENTATION: For the purposes of this report, the last well-formed intervertebral disc is reported as L5-S1 consistent with prior imaging. ALIGNMENT: Maintained lumbar lordosis. No malalignment. VERTEBRAE:Status post redo LEFT L3-4 and LEFT L4-5 laminectomies. Status post recent RIGHT L3-4 hemilaminectomy. Vertebral bodies intact. L3-4 disc edema in severe enhancing discogenic endplate changes. No associated abnormal disc enhancement. Stable moderate to severe L4-5 disc height loss with mild multilevel disc desiccation. Moderate L4-5 subacute discogenic endplate changes. Multilevel mild chronic discogenic endplate changes with trace discogenic endplate enhancement. CONUS MEDULLARIS AND CAUDA EQUINA: Conus medullaris terminates at T12-L1 and demonstrates normal morphology and signal characteristics. Anteriorly displaced cauda equina at L5-S1 junction due to new lentiform 0.9 x 2.2 x 4.9 cm (AP by transverse by CC)  posterior fluid collection with dependent T1 shortening and fluid fluid levels, no enhancement. It is unclear if this fluid collection is intrathecal or extradural. Cauda equina is otherwise unremarkable without nerve root clumping or abnormal enhancement. PARASPINAL AND OTHER SOFT TISSUES: RIGHT greater than LEFT paraspinal muscle denervation at and below the level surgical and intervention. Irregular 3 x 1.3 cm fluid collection within the LEFT L3 surgical bed with faint marginal enhancement most compatible with seroma. DISC LEVELS: L1-2: Annular bulging. No canal stenosis. Mild RIGHT neural foraminal narrowing. L2-3: Similar moderate broad-based disc bulge. Mild facet arthropathy and ligamentum flavum redundancy. Minimal canal stenosis. Moderate RIGHT, mild LEFT neural foraminal narrowing. L3-4: Bilateral laminectomies. Similar 6 x 19 mm fluid collection within RIGHT laminectomy  site extending to discectomy margin with thick rim enhancement effacing the RIGHT lateral recess. Enhancement extends into the RIGHT facet with slight RIGHT facet widening new from prior study. Granulation tissue encasing the traversing LEFT L4 nerve. No canal stenosis though there is slight medial displacement of the traversing RIGHT L4 nerve. Severe bilateral neural foraminal narrowing. Extensive annular enhancement consistent with discectomies without residual or recurrent disc protrusion. L4-5: Interval apparent redo LEFT hemilaminectomy hemilaminectomy. Small broad-based disc bulge asymmetric to the LEFT similar to prior examination with enhancing annular fissures. Mild RIGHT facet arthropathy. No canal stenosis. Mild RIGHT, moderate to severe LEFT neural foraminal narrowing. Granulation tissue LEFT lateral recess encasing the traversing LEFT L5 nerve. L5-S1: Transitional anatomy with pseudoarthrosis. No disc bulge, canal stenosis nor neural foraminal narrowing. IMPRESSION: 1. Status post interval LEFT L3-4 and suspected L4-5 redo  laminectomies. Exuberant discogenic endplate changes L3-4, less likely discitis osteomyelitis. 2. Recent RIGHT L3-4 hemilaminectomy. Stable 6 x 9 mm fluid collection within RIGHT surgical bed equivocal for abscess, however there is new enhancing RIGHT L3-4 facet effusion and mild widening may be infectious or inflammatory. 3. New 0.9 x 2.2 x 4.9 cm subacute hematoma at L5-S1 anteriorly displacing the cauda equina. 4. No canal stenosis. L2-3 through L4-5 neural foraminal narrowing: Severe at L3-4. Electronically Signed   By: Awilda Metro M.D.   On: 12/05/2017 14:22   Ct Angio Chest/abd/pel For Dissection W And/or W/wo  Result Date: 12/03/2017 CLINICAL DATA:  Back pain. EXAM: CT ANGIOGRAPHY CHEST, ABDOMEN AND PELVIS TECHNIQUE: Multidetector CT imaging through the chest, abdomen and pelvis was performed using the standard protocol during bolus administration of intravenous contrast. Multiplanar reconstructed images and MIPs were obtained and reviewed to evaluate the vascular anatomy. CONTRAST:  ISOVUE-370 IOPAMIDOL (ISOVUE-370) INJECTION 76% COMPARISON:  CT scan of April 02, 2008. FINDINGS: CTA CHEST FINDINGS Cardiovascular: Atherosclerosis of thoracic aorta is noted without aneurysm or dissection. Status post coronary artery bypass graft. No pericardial effusion is noted. Severe stenosis is noted at the origin of the right subclavian artery. Otherwise great vessels are widely patent. Mediastinum/Nodes: No enlarged mediastinal, hilar, or axillary lymph nodes. Thyroid gland, trachea, and esophagus demonstrate no significant findings. Lungs/Pleura: Lungs are clear. No pleural effusion or pneumothorax. Musculoskeletal: No chest wall abnormality. No acute or significant osseous findings. Review of the MIP images confirms the above findings. CTA ABDOMEN AND PELVIS FINDINGS VASCULAR Aorta: Atherosclerosis of abdominal aorta is noted without aneurysm or dissection. Celiac: Patent without evidence of aneurysm,  dissection, vasculitis or significant stenosis. SMA: Patent without evidence of aneurysm, dissection, vasculitis or significant stenosis. Renals: Both renal arteries are patent without evidence of aneurysm, dissection, vasculitis, fibromuscular dysplasia or significant stenosis. IMA: Patent without evidence of aneurysm, dissection, vasculitis or significant stenosis. Inflow: Patent without evidence of aneurysm, dissection, vasculitis or significant stenosis. Veins: No obvious venous abnormality within the limitations of this arterial phase study. Review of the MIP images confirms the above findings. NON-VASCULAR Hepatobiliary: No focal liver abnormality is seen. No gallstones, gallbladder wall thickening, or biliary dilatation. Pancreas: Unremarkable. No pancreatic ductal dilatation or surrounding inflammatory changes. Spleen: Normal in size without focal abnormality. Adrenals/Urinary Tract: Adrenal glands are unremarkable. Kidneys are normal, without renal calculi, focal lesion, or hydronephrosis. Bladder is unremarkable. Stomach/Bowel: Stomach is within normal limits. Appendix appears normal. No evidence of bowel wall thickening, distention, or inflammatory changes. Lymphatic: No significant adenopathy is noted. Reproductive: Prostate is unremarkable. Other: No abdominal wall hernia or abnormality. No abdominopelvic ascites. Musculoskeletal: No acute or  significant osseous findings. Review of the MIP images confirms the above findings. IMPRESSION: No evidence of thoracic or abdominal aortic dissection or aneurysm. Severe stenosis is noted at the origin of the right subclavian artery from the right innominate artery. No acute abnormality seen in the abdomen or pelvis. Electronically Signed   By: Lupita Raider, M.D.   On: 12/03/2017 19:31        Scheduled Meds: . aspirin EC  81 mg Oral Daily  . feeding supplement (ENSURE ENLIVE)  237 mL Oral BID BM  . gabapentin  300 mg Oral BID  . insulin aspart  0-5  Units Subcutaneous QHS  . insulin aspart  0-9 Units Subcutaneous TID WC  . polyethylene glycol  17 g Oral Daily  . sodium chloride flush  3 mL Intravenous Q12H   Continuous Infusions: . sodium chloride 75 mL/hr at 12/05/17 1428     LOS: 0 days    Time spent: 25 mins.More than 50% of that time was spent in counseling and/or coordination of care.      Burnadette Pop, MD [facility] Pager [phone]  If 7PM-7AM, please contact night-coverage [email or web address] Password [facility] 12/05/2017, 4:36 PM

## 2017-12-05 NOTE — Progress Notes (Signed)
Report given to Leann in Short stay prior to patient leaving for MRI under general anesthesia.

## 2017-12-05 NOTE — Progress Notes (Signed)
Patient received from PACU after MRI with gen anesthesia. Vitals stable. Patient alert, oriented, family in room.  Continue to monitor.

## 2017-12-05 NOTE — Evaluation (Signed)
Occupational Therapy Evaluation Patient Details Name: Jeffrey Nelson MRN: [id number] DOB: 06/20/1935 Today's Date: 12/05/2017    History of Present Illness 83 yo admitted after syncope at orthopedics office. PMHx: severe back pain s/p microdiscectomy 3/18, CAD, DM 2, HTN, MI, CVA, HOH   Clinical Impression   PTA, pt was living with his wife and was independent. Pt currently unable to perform bathing, dressing, toileting, and bed mobility due to significant pain in LLE stating "it feels like I am being cut in half." Pt performing RLE heel slides x5 with Max cues and BUE shoulder flexion x2; pt reporting he is unable to perform any further UE bed level exercises due to pain at LLE. Educating pt in relaxation techniques and providing ice for pain. Pt would benefit from further acute OT to facilitate safe dc. Due to limited functional performance due to pain, dc recommendation pending pt's functional progress. Feel pt will progress to dc home with 24 hour supervision and possible follow up OT.     Follow Up Recommendations  Supervision/Assistance - 24 hour;Other (comment)(Pending pt progress)    Equipment Recommendations  None recommended by OT    Recommendations for Other Services PT consult     Precautions / Restrictions Precautions Precautions: Back;Fall Precaution Booklet Issued: No Restrictions Weight Bearing Restrictions: No      Mobility Bed Mobility Overal bed mobility: Needs Assistance             General bed mobility comments: Pt declining any type of bed mobility due to pain. Pt stating "it is cutting me in half."  Transfers                 General transfer comment: Declined/unable    Balance                                           ADL either performed or assessed with clinical judgement   ADL Overall ADL's : Needs assistance/impaired Eating/Feeding: Set up;Bed level   Grooming: Wash/dry face;Bed level Grooming Details  (indicate cue type and reason): Pt washing his face at bed level Upper Body Bathing: Total assistance;Bed level   Lower Body Bathing: Total assistance;Bed level   Upper Body Dressing : Total assistance;Bed level   Lower Body Dressing: Total assistance;Bed level                 General ADL Comments: Limited due to pain. Anticipate pt will perform well once pain in managed.     Vision Baseline Vision/History: Wears glasses(R eye diminished vision due to CVA) Wears Glasses: At all times Patient Visual Report: Other (comment)(Unable to assess due to pain)       Perception     Praxis      Pertinent Vitals/Pain Pain Assessment: Faces Faces Pain Scale: Hurts worst Pain Location: LLE Pain Descriptors / Indicators: Crying;Constant Pain Intervention(s): Monitored during session;Limited activity within patient's tolerance;Repositioned;Premedicated before session;Patient requesting pain meds-RN notified;Ice applied     Hand Dominance Right   Extremity/Trunk Assessment Upper Extremity Assessment Upper Extremity Assessment: Overall WFL for tasks assessed   Lower Extremity Assessment Lower Extremity Assessment: Defer to PT evaluation   Cervical / Trunk Assessment Cervical / Trunk Assessment: Normal   Communication Communication Communication: HOH   Cognition Arousal/Alertness: Awake/alert Behavior During Therapy: WFL for tasks assessed/performed Overall Cognitive Status: Within Functional Limits for tasks assessed  General Comments: Pt crying throughout session due to pain and declining any type of mobility.    General Comments  Wife present at begining of session    Exercises Exercises: General Upper Extremity;General Lower Extremity General Exercises - Upper Extremity Shoulder Flexion: AROM;Both(x2; unable to finish 5 reps due to pain) General Exercises - Lower Extremity Heel Slides: 5 reps;AROM;Right(Max cues for  encouragement)   Shoulder Instructions      Home Living Family/patient expects to be discharged to:: Private residence Living Arrangements: Spouse/significant other Available Help at Discharge: Family;Available 24 hours/day Type of Home: House Home Access: Stairs to enter Entergy CorporationEntrance Stairs-Number of Steps: 5 Entrance Stairs-Rails: Right Home Layout: One level     Bathroom Shower/Tub: Producer, television/film/videoWalk-in shower   Bathroom Toilet: Handicapped height     Home Equipment: Environmental consultantWalker - 2 wheels;Adaptive equipment Adaptive Equipment: Reacher;Sock aid Additional Comments: Patient uses RW to climb steps       Prior Functioning/Environment Level of Independence: Independent with assistive device(s)        Comments: ADLs and mobility with RW        OT Problem List: Impaired balance (sitting and/or standing);Decreased safety awareness;Pain      OT Treatment/Interventions: Self-care/ADL training;Therapeutic exercise;Energy conservation;DME and/or AE instruction;Therapeutic activities;Patient/family education    OT Goals(Current goals can be found in the care plan section) Acute Rehab OT Goals Patient Stated Goal: pain to stop OT Goal Formulation: With patient Time For Goal Achievement: 12/19/17 Potential to Achieve Goals: Good ADL Goals Pt Will Perform Grooming: sitting;with set-up;with supervision Pt Will Perform Upper Body Dressing: with set-up;with supervision;sitting Pt Will Perform Lower Body Dressing: with min guard assist;sit to/from stand Pt Will Transfer to Toilet: with min guard assist;ambulating;bedside commode Additional ADL Goal #1: Pt will perform bed mobility with supervision in preparation for ADLs  OT Frequency: Min 2X/week   Barriers to D/C:            Co-evaluation              AM-PAC PT "6 Clicks" Daily Activity     Outcome Measure Help from another person eating meals?: A Little Help from another person taking care of personal grooming?: A Little Help from  another person toileting, which includes using toliet, bedpan, or urinal?: Total Help from another person bathing (including washing, rinsing, drying)?: Total Help from another person to put on and taking off regular upper body clothing?: Total Help from another person to put on and taking off regular lower body clothing?: Total 6 Click Score: 10   End of Session Nurse Communication: Mobility status  Activity Tolerance: Patient limited by pain Patient left: with call bell/phone within reach;in bed  OT Visit Diagnosis: Unsteadiness on feet (R26.81);Other abnormalities of gait and mobility (R26.89);Muscle weakness (generalized) (M62.81);Pain Pain - Right/Left: Left Pain - part of body: Leg                Time: [id number] OT Time Calculation (min): 21 min Charges:  OT General Charges $OT Visit: 1 Visit OT Evaluation $OT Eval Moderate Complexity: 1 Mod G-Codes:     Demari Gales MSOT, OTR/L Acute Rehab Pager: [phone] Office: [phone]  Theodoro GristCharis M Shyleigh Daughtry 12/05/2017, 6:00 PM

## 2017-12-05 NOTE — Progress Notes (Signed)
OT Cancellation    12/05/17 1200  OT Visit Information  Last OT Received On 12/05/17  Reason Eval/Treat Not Completed Patient at procedure or test/ unavailable (MRI. Will return as schedule allows.)   Keyra Virella MSOT, OTR/L Acute Rehab Pager: [phone] Office: [phone]

## 2017-12-05 NOTE — Transfer of Care (Signed)
Immediate Anesthesia Transfer of Care Note  Patient: Jeffrey Nelson  Procedure(s) Performed: MRI WITH ANESTHESIA (N/A )  Patient Location: PACU  Anesthesia Type:General  Level of Consciousness: awake, alert , sedated and patient cooperative  Airway & Oxygen Therapy: Patient Spontanous Breathing and Patient connected to nasal cannula oxygen  Post-op Assessment: Report given to RN, Post -op Vital signs reviewed and stable and Patient moving all extremities  Post vital signs: Reviewed and stable  Last Vitals:  Vitals Value Taken Time  BP 143/60 12/05/2017  1:09 PM  Temp 36.6 C 12/05/2017  1:09 PM  Pulse 79 12/05/2017  1:18 PM  Resp 3 12/05/2017  1:18 PM  SpO2 98 % 12/05/2017  1:18 PM  Vitals shown include unvalidated device data.  Last Pain:  Vitals:   12/05/17 1309  TempSrc:   PainSc: 0-No pain      Patients Stated Pain Goal: 4 (12/05/17 [id number])  Complications: No apparent anesthesia complications

## 2017-12-05 NOTE — Progress Notes (Addendum)
     Subjective: Day of Surgery Procedure(s) (LRB): MRI WITH ANESTHESIA (N/A)awake, alert and oriented x 4. Still with pain with standing and walking into the left mid thigh. MRI today shows post op changes bilateral L3-4, hematoma at the L5-S1 level epidural vs intradural with impression on the cauda equina this is 2 levels below previous surgery site at L3-4.   Patient reports pain as moderate.    Objective:   VITALS:  Temp:  [97.7 F (36.5 C)-98.5 F (36.9 C)] 98 F (36.7 C) (04/10 1642) Pulse Rate:  [78-95] 91 (04/10 1600) Resp:  [7-21] 13 (04/10 1600) BP: (107-143)/(58-85) 138/68 (04/10 1600) SpO2:  [92 %-100 %] 100 % (04/10 1600) Weight:  [205 lb 7.5 oz (93.2 kg)] 205 lb 7.5 oz (93.2 kg) (04/10 16100621)  Neurologically intact ABD soft Neurovascular intact Intact pulses distally Dorsiflexion/Plantar flexion intact Describes weakness in the left leg associated with trying to stand and walk from the bathroom.    LABS Recent Labs    12/03/17 1533 12/04/17 0342 12/05/17 0356  HGB 11.9* 10.3* 10.8*  WBC 15.2* 11.5* 9.0  PLT 414* 354 349   Recent Labs    12/04/17 0342 12/05/17 0356  NA 136 137  K 4.1 4.1  CL 103 102  CO2 25 26  BUN 19 12  CREATININE 1.00 0.83  GLUCOSE 138* 126*   No results for input(s): LABPT, INR in the last 72 hours.   Assessment/Plan: Day of Surgery Procedure(s) (LRB): MRI WITH ANESTHESIA (N/A)  Pain consistent with L3 radiculopathy, may be radiculitis or amytrophy with history of diabetes. BS are elevated to 195. Changes seen of MRI within 6 weeks of lumbar surgery can be difficult to interpret. There is no recurrent HNP, no surgery was done at the L4-5 level on the left side recently, remote surgery 2015. I can not explain the hematoma at L5-S1 but post surgical changes on MRI within 6 weeks of surgery can show changes as much as 4-5 levels from the site of surgery.  Hematoma may be a source of pain but his pain does seem to be a  mononeuropathy L3 left and this is in the area of previous surgery, not at the site of hematoma.  The neuroforamen are narrowed both left L3 and Left L4. With disc narrowing acute worsening of foramenal stenosis is a consideration.  Surgery presently does not  Appear to be indicated for a hematoma with focal motor deficit on exam and no bowel or bladder incontinence.  Need to rule out indolent infection as he has impressive Edema on either side of the disc space at L3-4.  Advance diet Up with therapy Will trial a TENS unit and start PT.  Try nerve membrane stabilizing medications, gabapentin or lyrica.  Vira BrownsJames Dima Mini 12/05/2017, 5:37 PM

## 2017-12-06 ENCOUNTER — Encounter (HOSPITAL_COMMUNITY): Payer: Self-pay | Admitting: Radiology

## 2017-12-06 DIAGNOSIS — Z9889 Other specified postprocedural states: Secondary | ICD-10-CM | POA: Diagnosis not present

## 2017-12-06 DIAGNOSIS — G8929 Other chronic pain: Secondary | ICD-10-CM | POA: Diagnosis not present

## 2017-12-06 DIAGNOSIS — M5440 Lumbago with sciatica, unspecified side: Secondary | ICD-10-CM | POA: Diagnosis not present

## 2017-12-06 LAB — CBC WITH DIFFERENTIAL/PLATELET
Band Neutrophils: 0 %
Basophils Absolute: 0 10*3/uL (ref 0.0–0.1)
Basophils Relative: 0 %
Blasts: 0 %
EOS PCT: 0 %
Eosinophils Absolute: 0 10*3/uL (ref 0.0–0.7)
HCT: 30.3 % — ABNORMAL LOW (ref 39.0–52.0)
HEMOGLOBIN: 10 g/dL — AB (ref 13.0–17.0)
LYMPHS ABS: 1.2 10*3/uL (ref 0.7–4.0)
Lymphocytes Relative: 12 %
MCH: 31.9 pg (ref 26.0–34.0)
MCHC: 33 g/dL (ref 30.0–36.0)
MCV: 96.8 fL (ref 78.0–100.0)
Metamyelocytes Relative: 0 %
Monocytes Absolute: 0.5 10*3/uL (ref 0.1–1.0)
Monocytes Relative: 5 %
Myelocytes: 0 %
NEUTROS PCT: 83 %
NRBC: 0 /100{WBCs}
Neutro Abs: 7.9 10*3/uL — ABNORMAL HIGH (ref 1.7–7.7)
Other: 0 %
PROMYELOCYTES RELATIVE: 0 %
Platelets: 318 10*3/uL (ref 150–400)
RBC: 3.13 MIL/uL — AB (ref 4.22–5.81)
RDW: 12.9 % (ref 11.5–15.5)
WBC: 9.6 10*3/uL (ref 4.0–10.5)

## 2017-12-06 LAB — GLUCOSE, CAPILLARY
GLUCOSE-CAPILLARY: 157 mg/dL — AB (ref 65–99)
GLUCOSE-CAPILLARY: 141 mg/dL — AB (ref 65–99)
GLUCOSE-CAPILLARY: 120 mg/dL — AB (ref 65–99)
Glucose-Capillary: 183 mg/dL — ABNORMAL HIGH (ref 65–99)
Glucose-Capillary: 129 mg/dL — ABNORMAL HIGH (ref 65–99)

## 2017-12-06 LAB — SEDIMENTATION RATE: Sed Rate: 32 mm/hr — ABNORMAL HIGH (ref 0–16)

## 2017-12-06 LAB — C-REACTIVE PROTEIN: CRP: 0.8 mg/dL (ref <1.0)

## 2017-12-06 NOTE — Progress Notes (Signed)
PT Cancellation Note  Patient Details Name: Jeffrey Nelson MRN: [id number] DOB: [birth year]   Cancelled Treatment:    Reason Eval/Treat Not Completed: Patient declined, no reason specified(pt premedicated and refused mobility stating pain 3/10 LLE and he would not mobilize in anticipation that pain will increase despite education)   [name] B [name] 12/06/2017, 2:57 PM  Delaney MeigsMaija Tabor [name], PT [phone]

## 2017-12-06 NOTE — Progress Notes (Signed)
Request for ESI. Case reviewed with Dr. Alfredo BattyMattern. Pt routinely takes Plavix, hx of CVA and CAD. Has been held since admission 4/9.Marland Kitchen. Plavix will need to be held 5 days prior to procedure. ESI can be offered Monday if pt remains inpt....or...Marland Kitchen.Marland Kitchen.  Procedure can be ordered/scheduled to be done at [facility] as early as next week. [address] Call [phone] to schedule.  Brayton ElKevin Novi Calia PA-C Interventional Radiology 12/06/2017 12:20 PM

## 2017-12-06 NOTE — Progress Notes (Signed)
Pt c/o of thigh pain the entire day...either a 12/10 or 20/10 with one time being a 7/10. Patient has applied ice the entire day as well as pain medication.

## 2017-12-06 NOTE — Progress Notes (Signed)
     Subjective: 1 Day Post-Op Procedure(s) (LRB): MRI WITH ANESTHESIA (N/A) Still with severe left thigh pain with claudication pattern. Review of MRI done with severe foramenal narrowing left L3 post microdiscectomy.  Patient reports pain as marked when standing and still limits his sleep.  Objective:   VITALS:  Temp:  [97.5 F (36.4 C)-98.7 F (37.1 C)] 97.5 F (36.4 C) (04/11 0719) Pulse Rate:  [63-95] 71 (04/11 0719) Resp:  [7-21] 12 (04/11 0719) BP: (120-143)/(58-85) 122/77 (04/11 0719) SpO2:  [92 %-100 %] 100 % (04/11 0719) Weight:  [207 lb 0.2 oz (93.9 kg)] 207 lb 0.2 oz (93.9 kg) (04/11 0600)  ABD soft Neurovascular intact Sensation intact distally Intact pulses distally Dorsiflexion/Plantar flexion intact No cellulitis present Motor left leg is normal    LABS Recent Labs    12/03/17 1533 12/04/17 0342 12/05/17 0356 12/06/17 0510  HGB 11.9* 10.3* 10.8* 10.0*  WBC 15.2* 11.5* 9.0 9.6  PLT 414* 354 349 318   Recent Labs    12/04/17 0342 12/05/17 0356  NA 136 137  K 4.1 4.1  CL 103 102  CO2 25 26  BUN 19 12  CREATININE 1.00 0.83  GLUCOSE 138* 126*   No results for input(s): LABPT, INR in the last 72 hours.   Assessment/Plan: 1 Day Post-Op Procedure(s) (LRB): MRI WITH ANESTHESIA (N/A)  Up with therapy  I will order a left L3 transforamenal ESI to assess for relief with block and steroid.    Vira BrownsJames Kaylah Chiasson 12/06/2017, 8:13 AMPatient ID: Jeffrey Nelson, male   DOB: 07-Dec-1934, 83 y.o.   MRN: [id number]

## 2017-12-06 NOTE — Progress Notes (Signed)
PROGRESS NOTE    Jeffrey Nelson  WJX:[id number] DOB: [birth year] DOA: 12/03/2017 PCP: Deloris Ping, MD   Brief Narrative: This is an 83 year old male with history of coronary artery disease with prior MIs status post CABG, prior CVA, diabetes mellitus type 2, hypertension, who went to his orthopedic surgery office  for excruciating severe back pain, and while in office patient felt lightheaded, dizzy and passed out.  He was found to have significant hypotension and was sent to the emergency room.  In the ED he was found to be orthostatic, he was given IV fluids and was admitted to the hospital. Currently he is being managed for back pain.  Assessment & Plan:   Active Problems:   Diabetes mellitus type 2, uncomplicated (HCC)   Status post lumbar laminectomy   Syncope   Dehydration   Chronic back pain   Hypotension   Subclavian artery stenosis, right (HCC)   Syncopal episode -Patient with significant hypotension in the orthopedics office, he states that he has been having poor p.o. intake, may be related to dehydration, -patient without any chest pain/symptoms/palpitations, troponin has remained negative -Echo showed normal ejection fraction. -Currently his blood pressure stable. -PT/OT following  Hypotension -Patient has been receiving steroids in the past and has had steroid injections, cortisol was checked and it was found to be decreased to 2.  -Cosyntropin stimulation test done  with improvement of cortisone level suggesting normal response.  No need on starting steroids.  His blood pressure stable -Antihypertensives will be resumed soon  Chronic back pain -H/O previous microdiscectomies -Orthopedics following -As per Ortho,  MRI of the L-spine obtained: Result: 1. Status post interval LEFT L3-4 and suspected L4-5 redo laminectomies. Exuberant discogenic endplate changes L3-4, less likely discitis osteomyelitis. 2. Recent RIGHT L3-4 hemilaminectomy. Stable 6 x 9  mm fluid collection within RIGHT surgical bed equivocal for abscess, however there is new enhancing RIGHT L3-4 facet effusion and mild widening may be infectious or inflammatory. 3. New 0.9 x 2.2 x 4.9 cm subacute hematoma at L5-S1 anteriorly displacing the cauda equina. 4. No canal stenosis. L2-3 through L4-5 neural foraminal narrowing: Severe at L3-4  - Ortho planned for ESI.  IR on board but he is supposed to be off Plavix for 5 days.  Last intake was on 12/04/17. -PT evaluation pending, continue  pain regimen.  Also started on gabapentin -Patient complains of severe left-sided thigh pain as well as back pain this morning. -Low suspicion for infectious etiology  Type 2 diabetes mellitus -Continue sliding scale, hemoglobin A1c less than 7 which shows good control  Coronary artery disease -No chest pain, no cardiac symptoms. Was on  aspirin, Plavix at home.Currently held     DVT prophylaxis: SCD Code Status: Full Family Communication:None present at the bedside Disposition Plan: To be determined   Consultants: Orthopedic surgery, cardiology  Procedures: None  Antimicrobials: None  Subjective: Patient seen and examined the bedside this morning.  Continues to complain of severe pain on the left thigh.  He says the pain hasnot improved.  Objective: Vitals:   12/06/17 0530 12/06/17 0600 12/06/17 0719 12/06/17 1131  BP: 125/69  122/77 (!) 158/86  Pulse: 63  71 69  Resp:   12 15  Temp: 97.6 F (36.4 C)  (!) 97.5 F (36.4 C) 97.6 F (36.4 C)  TempSrc: Oral  Oral Oral  SpO2: 99%  100% 100%  Weight:  93.9 kg (207 lb 0.2 oz)    Height:  6\' 2"  (1.88  m)      Intake/Output Summary (Last 24 hours) at 12/06/2017 1245 Last data filed at 12/06/2017 1045 Gross per 24 hour  Intake 1615 ml  Output 1350 ml  Net 265 ml   Filed Weights   12/03/17 1531 12/05/17 0621 12/06/17 0600  Weight: 92.5 kg (204 lb) 93.2 kg (205 lb 7.5 oz) 93.9 kg (207 lb 0.2 oz)     Examination:  General exam: Appears calm and comfortable ,Not in distress,average built HEENT:PERRL,Oral mucosa moist, Ear/Nose normal on gross exam Respiratory system: Bilateral equal air entry, normal vesicular breath sounds, no wheezes or crackles  Cardiovascular system: S1 & S2 heard, RRR. No JVD, murmurs, rubs, gallops or clicks. No pedal edema. Gastrointestinal system: Abdomen is nondistended, soft and nontender. No organomegaly or masses felt. Normal bowel sounds heard. Central nervous system: Alert and oriented. No focal neurological deficits. Extremities: No edema, no clubbing ,no cyanosis, distal peripheral pulses palpable. Skin: No rashes, lesions or ulcers,no icterus ,no pallor MSK: Normal muscle bulk,tone ,power Severe tenderness on the lower back    Data Reviewed: I have personally reviewed following labs and imaging studies  CBC: Recent Labs  Lab 12/03/17 1533 12/04/17 0342 12/05/17 0356 12/06/17 0510  WBC 15.2* 11.5* 9.0 9.6  NEUTROABS 11.9*  --   --  7.9*  HGB 11.9* 10.3* 10.8* 10.0*  HCT 36.2* 31.9* 32.7* 30.3*  MCV 96.0 96.1 97.0 96.8  PLT 414* 354 349 318   Basic Metabolic Panel: Recent Labs  Lab 12/03/17 1533 12/04/17 0342 12/05/17 0356  NA 137 136 137  K 4.4 4.1 4.1  CL 97* 103 102  CO2 24 25 26   GLUCOSE 137* 138* 126*  BUN 24* 19 12  CREATININE 1.21 1.00 0.83  CALCIUM 9.1 9.1 9.2  MG  --  1.6*  --   PHOS  --  3.2  --    GFR: Estimated Creatinine Clearance: 81.2 mL/min (by C-G formula based on SCr of 0.83 mg/dL). Liver Function Tests: Recent Labs  Lab 12/03/17 1533 12/04/17 0342  AST 25 18  ALT 16* 15*  ALKPHOS 165* 132*  BILITOT 0.5 0.6  PROT 6.8 5.7*  ALBUMIN 3.4* 2.9*   No results for input(s): LIPASE, AMYLASE in the last 168 hours. No results for input(s): AMMONIA in the last 168 hours. Coagulation Profile: No results for input(s): INR, PROTIME in the last 168 hours. Cardiac Enzymes: Recent Labs  Lab 12/03/17 2147  12/04/17 0342 12/04/17 0558  TROPONINI <0.03 <0.03 <0.03   BNP (last 3 results) No results for input(s): PROBNP in the last 8760 hours. HbA1C: Recent Labs    12/04/17 0342  HGBA1C 6.8*   CBG: Recent Labs  Lab 12/05/17 1640 12/05/17 2208 12/06/17 0529 12/06/17 0723 12/06/17 1136  GLUCAP 195* 168* 183* 157* 141*   Lipid Profile: No results for input(s): CHOL, HDL, LDLCALC, TRIG, CHOLHDL, LDLDIRECT in the last 72 hours. Thyroid Function Tests: Recent Labs    12/04/17 0342  TSH 1.607   Anemia Panel: No results for input(s): VITAMINB12, FOLATE, FERRITIN, TIBC, IRON, RETICCTPCT in the last 72 hours. Sepsis Labs: Recent Labs  Lab 12/03/17 1654 12/03/17 1955  LATICACIDVEN 1.96* 1.13    Recent Results (from the past 240 hour(s))  Culture, blood (routine x 2)     Status: None (Preliminary result)   Collection Time: 12/03/17  6:00 PM  Result Value Ref Range Status   Specimen Description BLOOD RIGHT ANTECUBITAL  Final   Special Requests   Final  BOTTLES DRAWN AEROBIC AND ANAEROBIC Blood Culture adequate volume   Culture   Final    NO GROWTH 2 DAYS Performed at [facility], [address], [place], [place] [address]    Report Status PENDING  Incomplete  Culture, blood (routine x 2)     Status: None (Preliminary result)   Collection Time: 12/03/17  6:15 PM  Result Value Ref Range Status   Specimen Description BLOOD LEFT WRIST  Final   Special Requests   Final    BOTTLES DRAWN AEROBIC AND ANAEROBIC Blood Culture adequate volume   Culture   Final    NO GROWTH 2 DAYS Performed at [facility], [address] [place], [place] [address]    Report Status PENDING  Incomplete  MRSA PCR Screening     Status: None   Collection Time: 12/04/17  2:24 AM  Result Value Ref Range Status   MRSA by PCR NEGATIVE NEGATIVE Final    Comment:        The GeneXpert MRSA Assay (FDA approved for NASAL specimens only), is one component of a comprehensive MRSA  colonization surveillance program. It is not intended to diagnose MRSA infection nor to guide or monitor treatment for MRSA infections. Performed at [facility], [address], [place], [place] [address]          Radiology Studies: Mr Lumbar Spine W Wo Contrast  Result Date: 12/05/2017 CLINICAL DATA:  Extreme LEFT leg pain. Status post lumbar laminectomy November 12, 2017 and October 13, 2017. EXAM: MRI LUMBAR SPINE WITHOUT AND WITH CONTRAST TECHNIQUE: Multiplanar and multiecho pulse sequences of the lumbar spine were obtained without and with intravenous contrast. CONTRAST:  19mL MULTIHANCE GADOBENATE DIMEGLUMINE 529 MG/ML IV SOLN COMPARISON:  MRI lumbar spine November 11, 2017 FINDINGS: SEGMENTATION: For the purposes of this report, the last well-formed intervertebral disc is reported as L5-S1 consistent with prior imaging. ALIGNMENT: Maintained lumbar lordosis. No malalignment. VERTEBRAE:Status post redo LEFT L3-4 and LEFT L4-5 laminectomies. Status post recent RIGHT L3-4 hemilaminectomy. Vertebral bodies intact. L3-4 disc edema in severe enhancing discogenic endplate changes. No associated abnormal disc enhancement. Stable moderate to severe L4-5 disc height loss with mild multilevel disc desiccation. Moderate L4-5 subacute discogenic endplate changes. Multilevel mild chronic discogenic endplate changes with trace discogenic endplate enhancement. CONUS MEDULLARIS AND CAUDA EQUINA: Conus medullaris terminates at T12-L1 and demonstrates normal morphology and signal characteristics. Anteriorly displaced cauda equina at L5-S1 junction due to new lentiform 0.9 x 2.2 x 4.9 cm (AP by transverse by CC) posterior fluid collection with dependent T1 shortening and fluid fluid levels, no enhancement. It is unclear if this fluid collection is intrathecal or extradural. Cauda equina is otherwise unremarkable without nerve root clumping or abnormal enhancement. PARASPINAL AND OTHER SOFT TISSUES: RIGHT  greater than LEFT paraspinal muscle denervation at and below the level surgical and intervention. Irregular 3 x 1.3 cm fluid collection within the LEFT L3 surgical bed with faint marginal enhancement most compatible with seroma. DISC LEVELS: L1-2: Annular bulging. No canal stenosis. Mild RIGHT neural foraminal narrowing. L2-3: Similar moderate broad-based disc bulge. Mild facet arthropathy and ligamentum flavum redundancy. Minimal canal stenosis. Moderate RIGHT, mild LEFT neural foraminal narrowing. L3-4: Bilateral laminectomies. Similar 6 x 19 mm fluid collection within RIGHT laminectomy site extending to discectomy margin with thick rim enhancement effacing the RIGHT lateral recess. Enhancement extends into the RIGHT facet with slight RIGHT facet widening new from prior study. Granulation tissue encasing the traversing LEFT L4  nerve. No canal stenosis though there is slight medial displacement of the traversing RIGHT L4 nerve. Severe bilateral neural foraminal narrowing. Extensive annular enhancement consistent with discectomies without residual or recurrent disc protrusion. L4-5: Interval apparent redo LEFT hemilaminectomy hemilaminectomy. Small broad-based disc bulge asymmetric to the LEFT similar to prior examination with enhancing annular fissures. Mild RIGHT facet arthropathy. No canal stenosis. Mild RIGHT, moderate to severe LEFT neural foraminal narrowing. Granulation tissue LEFT lateral recess encasing the traversing LEFT L5 nerve. L5-S1: Transitional anatomy with pseudoarthrosis. No disc bulge, canal stenosis nor neural foraminal narrowing. IMPRESSION: 1. Status post interval LEFT L3-4 and suspected L4-5 redo laminectomies. Exuberant discogenic endplate changes L3-4, less likely discitis osteomyelitis. 2. Recent RIGHT L3-4 hemilaminectomy. Stable 6 x 9 mm fluid collection within RIGHT surgical bed equivocal for abscess, however there is new enhancing RIGHT L3-4 facet effusion and mild widening may be  infectious or inflammatory. 3. New 0.9 x 2.2 x 4.9 cm subacute hematoma at L5-S1 anteriorly displacing the cauda equina. 4. No canal stenosis. L2-3 through L4-5 neural foraminal narrowing: Severe at L3-4. Electronically Signed   By: Awilda Metro M.D.   On: 12/05/2017 14:22        Scheduled Meds: . aspirin EC  81 mg Oral Daily  . feeding supplement (ENSURE ENLIVE)  237 mL Oral BID BM  . gabapentin  300 mg Oral TID  . insulin aspart  0-5 Units Subcutaneous QHS  . insulin aspart  0-9 Units Subcutaneous TID WC  . polyethylene glycol  17 g Oral Daily  . sodium chloride flush  3 mL Intravenous Q12H   Continuous Infusions: . sodium chloride 75 mL/hr at 12/06/17 0919     LOS: 0 days    Time spent: 25 mins.More than 50% of that time was spent in counseling and/or coordination of care.      Burnadette Pop, MD [facility] Pager [phone]  If 7PM-7AM, please contact night-coverage [email or web address] Password [email or web address] 12/06/2017, 12:45 PM

## 2017-12-06 NOTE — Progress Notes (Deleted)
Request for ESI. Case reviewed with Dr. Alfredo BattyMattern. Pt is currently on Plavix with hx of CVA and CAD. Plavix will need to be held 5 days prior to procedure.  Procedure can be ordered/scheduled to be done at [facility] as early as next week. [address] Call [phone] to schedule.  Brayton ElKevin Virginia Curl PA-C Interventional Radiology 12/06/2017 11:49 AM

## 2017-12-07 DIAGNOSIS — R55 Syncope and collapse: Secondary | ICD-10-CM | POA: Diagnosis not present

## 2017-12-07 DIAGNOSIS — I708 Atherosclerosis of other arteries: Secondary | ICD-10-CM | POA: Diagnosis present

## 2017-12-07 DIAGNOSIS — R000 Tachycardia, unspecified: Secondary | ICD-10-CM | POA: Diagnosis not present

## 2017-12-07 DIAGNOSIS — D500 Iron deficiency anemia secondary to blood loss (chronic): Secondary | ICD-10-CM | POA: Diagnosis not present

## 2017-12-07 DIAGNOSIS — E785 Hyperlipidemia, unspecified: Secondary | ICD-10-CM | POA: Diagnosis present

## 2017-12-07 DIAGNOSIS — R296 Repeated falls: Secondary | ICD-10-CM | POA: Diagnosis present

## 2017-12-07 DIAGNOSIS — Z961 Presence of intraocular lens: Secondary | ICD-10-CM | POA: Diagnosis not present

## 2017-12-07 DIAGNOSIS — H9190 Unspecified hearing loss, unspecified ear: Secondary | ICD-10-CM | POA: Diagnosis not present

## 2017-12-07 DIAGNOSIS — I959 Hypotension, unspecified: Secondary | ICD-10-CM | POA: Diagnosis not present

## 2017-12-07 DIAGNOSIS — M5440 Lumbago with sciatica, unspecified side: Secondary | ICD-10-CM | POA: Diagnosis not present

## 2017-12-07 DIAGNOSIS — Z9889 Other specified postprocedural states: Secondary | ICD-10-CM | POA: Diagnosis not present

## 2017-12-07 DIAGNOSIS — G8929 Other chronic pain: Secondary | ICD-10-CM | POA: Diagnosis not present

## 2017-12-07 DIAGNOSIS — Z87442 Personal history of urinary calculi: Secondary | ICD-10-CM | POA: Diagnosis not present

## 2017-12-07 DIAGNOSIS — M419 Scoliosis, unspecified: Secondary | ICD-10-CM | POA: Diagnosis present

## 2017-12-07 DIAGNOSIS — I25119 Atherosclerotic heart disease of native coronary artery with unspecified angina pectoris: Secondary | ICD-10-CM | POA: Diagnosis present

## 2017-12-07 DIAGNOSIS — D72829 Elevated white blood cell count, unspecified: Secondary | ICD-10-CM | POA: Diagnosis not present

## 2017-12-07 DIAGNOSIS — I252 Old myocardial infarction: Secondary | ICD-10-CM | POA: Diagnosis not present

## 2017-12-07 DIAGNOSIS — R32 Unspecified urinary incontinence: Secondary | ICD-10-CM | POA: Diagnosis not present

## 2017-12-07 DIAGNOSIS — M5442 Lumbago with sciatica, left side: Secondary | ICD-10-CM | POA: Diagnosis not present

## 2017-12-07 DIAGNOSIS — M48062 Spinal stenosis, lumbar region with neurogenic claudication: Secondary | ICD-10-CM | POA: Diagnosis not present

## 2017-12-07 DIAGNOSIS — Z96651 Presence of right artificial knee joint: Secondary | ICD-10-CM | POA: Diagnosis present

## 2017-12-07 DIAGNOSIS — T380X5A Adverse effect of glucocorticoids and synthetic analogues, initial encounter: Secondary | ICD-10-CM | POA: Diagnosis present

## 2017-12-07 DIAGNOSIS — I10 Essential (primary) hypertension: Secondary | ICD-10-CM | POA: Diagnosis not present

## 2017-12-07 DIAGNOSIS — E119 Type 2 diabetes mellitus without complications: Secondary | ICD-10-CM | POA: Diagnosis present

## 2017-12-07 DIAGNOSIS — M5417 Radiculopathy, lumbosacral region: Secondary | ICD-10-CM | POA: Diagnosis not present

## 2017-12-07 DIAGNOSIS — E7800 Pure hypercholesterolemia, unspecified: Secondary | ICD-10-CM | POA: Diagnosis not present

## 2017-12-07 DIAGNOSIS — N179 Acute kidney failure, unspecified: Secondary | ICD-10-CM | POA: Diagnosis not present

## 2017-12-07 DIAGNOSIS — I9589 Other hypotension: Secondary | ICD-10-CM | POA: Diagnosis not present

## 2017-12-07 DIAGNOSIS — E860 Dehydration: Secondary | ICD-10-CM | POA: Diagnosis present

## 2017-12-07 DIAGNOSIS — Z951 Presence of aortocoronary bypass graft: Secondary | ICD-10-CM | POA: Diagnosis not present

## 2017-12-07 DIAGNOSIS — E2740 Unspecified adrenocortical insufficiency: Secondary | ICD-10-CM | POA: Diagnosis not present

## 2017-12-07 DIAGNOSIS — Z419 Encounter for procedure for purposes other than remedying health state, unspecified: Secondary | ICD-10-CM | POA: Diagnosis not present

## 2017-12-07 LAB — GLUCOSE, CAPILLARY
GLUCOSE-CAPILLARY: 86 mg/dL (ref 65–99)
GLUCOSE-CAPILLARY: 115 mg/dL — AB (ref 65–99)
Glucose-Capillary: 81 mg/dL (ref 65–99)
Glucose-Capillary: 115 mg/dL — ABNORMAL HIGH (ref 65–99)
Glucose-Capillary: 116 mg/dL — ABNORMAL HIGH (ref 65–99)
Glucose-Capillary: 127 mg/dL — ABNORMAL HIGH (ref 65–99)

## 2017-12-07 MED ORDER — HYDROMORPHONE HCL 1 MG/ML IJ SOLN
1.0000 mg | INTRAMUSCULAR | Status: DC | PRN
  Administered 2017-12-07 – 2017-12-11 (×10): 1 mg via INTRAVENOUS
  Filled 2017-12-07 (×10): qty 1

## 2017-12-07 NOTE — Progress Notes (Signed)
PROGRESS NOTE    Jeffrey Nelson  NFA:[id number] DOB: Jul 19, 1935 DOA: 12/03/2017 PCP: Deloris Ping, MD   Brief Narrative: This is an 83 year old male with history of coronary artery disease with prior MIs status post CABG, prior CVA, diabetes mellitus type 2, hypertension, who went to his orthopedic surgery office  for excruciating severe back pain, and while in office patient felt lightheaded, dizzy and passed out.  He was found to have significant hypotension and was sent to the emergency room.  In the ED he was found to be orthostatic, he was given IV fluids and was admitted to the hospital. Currently he is being managed for back pain.  Assessment & Plan:   Active Problems:   Diabetes mellitus type 2, uncomplicated (HCC)   Status post lumbar laminectomy   Syncope   Dehydration   Chronic back pain   Hypotension   Subclavian artery stenosis, right (HCC)  Chronic back pain -H/O previous microdiscectomies -Orthopedics following -As per Ortho,  MRI of the L-spine obtained: Result: 1. Status post interval LEFT L3-4 and suspected L4-5 redo laminectomies. Exuberant discogenic endplate changes L3-4, less likely discitis osteomyelitis. 2. Recent RIGHT L3-4 hemilaminectomy. Stable 6 x 9 mm fluid collection within RIGHT surgical bed equivocal for abscess, however there is new enhancing RIGHT L3-4 facet effusion and mild widening may be infectious or inflammatory. 3. New 0.9 x 2.2 x 4.9 cm subacute hematoma at L5-S1 anteriorly displacing the cauda equina. 4. No canal stenosis. L2-3 through L4-5 neural foraminal narrowing: Severe at L3-4  - Ortho planned for ESI.  IR on board but he is supposed to be off Plavix for 5 days.  Last intake was on 12/04/17. IR planning for ESI on Monday. -PT evaluation pending, continue  pain regimen.  Also started on gabapentin.On dilaudid.Dose increased today. -Patient complains of severe left-sided thigh pain as well as back pain this morning. -Low  suspicion for infectious etiology   Syncopal episode -Patient with significant hypotension in the orthopedics office, he states that he has been having poor p.o. intake, may be related to dehydration. -Patient without any chest pain/symptoms/palpitations, troponin has remained negative -Echo showed normal ejection fraction. -Currently his blood pressure stable. -PT/OT following  Hypotension -Patient has been receiving steroids in the past and has had steroid injections, cortisol was checked and it was found to be decreased to 2.   -Cosyntropin stimulation test done  with improvement of cortisone level suggesting normal response.  No need on starting steroids.  His blood pressure stable -Antihypertensives will be resumed soon  Type 2 diabetes mellitus -Continue sliding scale, hemoglobin A1c less than 7 which shows good control  Coronary artery disease -No chest pain, no cardiac symptoms. Was on  aspirin, Plavix at home.Currently held     DVT prophylaxis: SCD Code Status: Full Family Communication:None present at the bedside Disposition Plan: To be determined.As per Orthopedics   Consultants: Orthopedic surgery, cardiology  Procedures: None  Antimicrobials: None  Subjective: Patient seen and examined at bedside this morning.  He still complains of shooting pain on the left thigh radiating form back.  He has been applying ice on the left thigh for  pain.  Objective: Vitals:   12/07/17 0344 12/07/17 0445 12/07/17 0721 12/07/17 1200  BP: (!) 145/63  (!) 142/91 139/62  Pulse: 67  95 96  Resp: 11  20   Temp: 98 F (36.7 C)  (!) 97 F (36.1 C) 99.2 F (37.3 C)  TempSrc: Oral  Axillary Oral  SpO2: 99%  99%   Weight:  95 kg (209 lb 7 oz)    Height:        Intake/Output Summary (Last 24 hours) at 12/07/2017 1329 Last data filed at 12/07/2017 0830 Gross per 24 hour  Intake -  Output 1900 ml  Net -1900 ml   Filed Weights   12/05/17 0621 12/06/17 0600 12/07/17 0445    Weight: 93.2 kg (205 lb 7.5 oz) 93.9 kg (207 lb 0.2 oz) 95 kg (209 lb 7 oz)    Examination:  General exam: Appears calm and comfortable ,Not in distress,average built HEENT:PERRL,Oral mucosa moist, Ear/Nose normal on gross exam Respiratory system: Bilateral equal air entry, normal vesicular breath sounds, no wheezes or crackles  Cardiovascular system: S1 & S2 heard, RRR. No JVD, murmurs, rubs, gallops or clicks. Gastrointestinal system: Abdomen is nondistended, soft and nontender. No organomegaly or masses felt. Normal bowel sounds heard. Central nervous system: Alert and oriented. No focal neurological deficits. Extremities: No edema, no clubbing ,no cyanosis, distal peripheral pulses palpable Skin: No rashes, lesions or ulcers,no icterus ,no pallor MSK: Normal muscle bulk,tone ,power.Severe tenderness on the lower back Psychiatry: Judgement and insight appear normal. Mood & affect appropriate.      Data Reviewed: I have personally reviewed following labs and imaging studies  CBC: Recent Labs  Lab 12/03/17 1533 12/04/17 0342 12/05/17 0356 12/06/17 0510  WBC 15.2* 11.5* 9.0 9.6  NEUTROABS 11.9*  --   --  7.9*  HGB 11.9* 10.3* 10.8* 10.0*  HCT 36.2* 31.9* 32.7* 30.3*  MCV 96.0 96.1 97.0 96.8  PLT 414* 354 349 318   Basic Metabolic Panel: Recent Labs  Lab 12/03/17 1533 12/04/17 0342 12/05/17 0356  NA 137 136 137  K 4.4 4.1 4.1  CL 97* 103 102  CO2 24 25 26   GLUCOSE 137* 138* 126*  BUN 24* 19 12  CREATININE 1.21 1.00 0.83  CALCIUM 9.1 9.1 9.2  MG  --  1.6*  --   PHOS  --  3.2  --    GFR: Estimated Creatinine Clearance: 81.2 mL/min (by C-G formula based on SCr of 0.83 mg/dL). Liver Function Tests: Recent Labs  Lab 12/03/17 1533 12/04/17 0342  AST 25 18  ALT 16* 15*  ALKPHOS 165* 132*  BILITOT 0.5 0.6  PROT 6.8 5.7*  ALBUMIN 3.4* 2.9*   No results for input(s): LIPASE, AMYLASE in the last 168 hours. No results for input(s): AMMONIA in the last 168  hours. Coagulation Profile: No results for input(s): INR, PROTIME in the last 168 hours. Cardiac Enzymes: Recent Labs  Lab 12/03/17 2147 12/04/17 0342 12/04/17 0558  TROPONINI <0.03 <0.03 <0.03   BNP (last 3 results) No results for input(s): PROBNP in the last 8760 hours. HbA1C: No results for input(s): HGBA1C in the last 72 hours. CBG: Recent Labs  Lab 12/06/17 2057 12/07/17 0443 12/07/17 0741 12/07/17 0941 12/07/17 1223  GLUCAP 129* 86 81 115* 116*   Lipid Profile: No results for input(s): CHOL, HDL, LDLCALC, TRIG, CHOLHDL, LDLDIRECT in the last 72 hours. Thyroid Function Tests: No results for input(s): TSH, T4TOTAL, FREET4, T3FREE, THYROIDAB in the last 72 hours. Anemia Panel: No results for input(s): VITAMINB12, FOLATE, FERRITIN, TIBC, IRON, RETICCTPCT in the last 72 hours. Sepsis Labs: Recent Labs  Lab 12/03/17 1654 12/03/17 1955  LATICACIDVEN 1.96* 1.13    Recent Results (from the past 240 hour(s))  Culture, blood (routine x 2)     Status: None (Preliminary result)   Collection Time:  12/03/17  6:00 PM  Result Value Ref Range Status   Specimen Description BLOOD RIGHT ANTECUBITAL  Final   Special Requests   Final    BOTTLES DRAWN AEROBIC AND ANAEROBIC Blood Culture adequate volume   Culture   Final    NO GROWTH 4 DAYS Performed at [facility], [address], [place], [place] [address]    Report Status PENDING  Incomplete  Culture, blood (routine x 2)     Status: None (Preliminary result)   Collection Time: 12/03/17  6:15 PM  Result Value Ref Range Status   Specimen Description BLOOD LEFT WRIST  Final   Special Requests   Final    BOTTLES DRAWN AEROBIC AND ANAEROBIC Blood Culture adequate volume   Culture   Final    NO GROWTH 4 DAYS Performed at [facility], [address], [place], [place] [address]    Report Status PENDING  Incomplete  MRSA PCR Screening     Status: None   Collection Time: 12/04/17  2:24 AM  Result Value Ref Range  Status   MRSA by PCR NEGATIVE NEGATIVE Final    Comment:        The GeneXpert MRSA Assay (FDA approved for NASAL specimens only), is one component of a comprehensive MRSA colonization surveillance program. It is not intended to diagnose MRSA infection nor to guide or monitor treatment for MRSA infections. Performed at [facility], [address], [place], [place] [address]          Radiology Studies: Mr Lumbar Spine W Wo Contrast  Result Date: 12/05/2017 CLINICAL DATA:  Extreme LEFT leg pain. Status post lumbar laminectomy November 12, 2017 and October 13, 2017. EXAM: MRI LUMBAR SPINE WITHOUT AND WITH CONTRAST TECHNIQUE: Multiplanar and multiecho pulse sequences of the lumbar spine were obtained without and with intravenous contrast. CONTRAST:  19mL MULTIHANCE GADOBENATE DIMEGLUMINE 529 MG/ML IV SOLN COMPARISON:  MRI lumbar spine November 11, 2017 FINDINGS: SEGMENTATION: For the purposes of this report, the last well-formed intervertebral disc is reported as L5-S1 consistent with prior imaging. ALIGNMENT: Maintained lumbar lordosis. No malalignment. VERTEBRAE:Status post redo LEFT L3-4 and LEFT L4-5 laminectomies. Status post recent RIGHT L3-4 hemilaminectomy. Vertebral bodies intact. L3-4 disc edema in severe enhancing discogenic endplate changes. No associated abnormal disc enhancement. Stable moderate to severe L4-5 disc height loss with mild multilevel disc desiccation. Moderate L4-5 subacute discogenic endplate changes. Multilevel mild chronic discogenic endplate changes with trace discogenic endplate enhancement. CONUS MEDULLARIS AND CAUDA EQUINA: Conus medullaris terminates at T12-L1 and demonstrates normal morphology and signal characteristics. Anteriorly displaced cauda equina at L5-S1 junction due to new lentiform 0.9 x 2.2 x 4.9 cm (AP by transverse by CC) posterior fluid collection with dependent T1 shortening and fluid fluid levels, no enhancement. It is unclear if this fluid  collection is intrathecal or extradural. Cauda equina is otherwise unremarkable without nerve root clumping or abnormal enhancement. PARASPINAL AND OTHER SOFT TISSUES: RIGHT greater than LEFT paraspinal muscle denervation at and below the level surgical and intervention. Irregular 3 x 1.3 cm fluid collection within the LEFT L3 surgical bed with faint marginal enhancement most compatible with seroma. DISC LEVELS: L1-2: Annular bulging. No canal stenosis. Mild RIGHT neural foraminal narrowing. L2-3: Similar moderate broad-based disc bulge. Mild facet arthropathy and ligamentum flavum redundancy. Minimal canal stenosis. Moderate RIGHT, mild LEFT neural foraminal narrowing. L3-4: Bilateral laminectomies. Similar 6 x 19 mm fluid collection within RIGHT laminectomy site extending to discectomy margin with thick  rim enhancement effacing the RIGHT lateral recess. Enhancement extends into the RIGHT facet with slight RIGHT facet widening new from prior study. Granulation tissue encasing the traversing LEFT L4 nerve. No canal stenosis though there is slight medial displacement of the traversing RIGHT L4 nerve. Severe bilateral neural foraminal narrowing. Extensive annular enhancement consistent with discectomies without residual or recurrent disc protrusion. L4-5: Interval apparent redo LEFT hemilaminectomy hemilaminectomy. Small broad-based disc bulge asymmetric to the LEFT similar to prior examination with enhancing annular fissures. Mild RIGHT facet arthropathy. No canal stenosis. Mild RIGHT, moderate to severe LEFT neural foraminal narrowing. Granulation tissue LEFT lateral recess encasing the traversing LEFT L5 nerve. L5-S1: Transitional anatomy with pseudoarthrosis. No disc bulge, canal stenosis nor neural foraminal narrowing. IMPRESSION: 1. Status post interval LEFT L3-4 and suspected L4-5 redo laminectomies. Exuberant discogenic endplate changes L3-4, less likely discitis osteomyelitis. 2. Recent RIGHT L3-4  hemilaminectomy. Stable 6 x 9 mm fluid collection within RIGHT surgical bed equivocal for abscess, however there is new enhancing RIGHT L3-4 facet effusion and mild widening may be infectious or inflammatory. 3. New 0.9 x 2.2 x 4.9 cm subacute hematoma at L5-S1 anteriorly displacing the cauda equina. 4. No canal stenosis. L2-3 through L4-5 neural foraminal narrowing: Severe at L3-4. Electronically Signed   By: Awilda Metro M.D.   On: 12/05/2017 14:22        Scheduled Meds: . aspirin EC  81 mg Oral Daily  . feeding supplement (ENSURE ENLIVE)  237 mL Oral BID BM  . gabapentin  300 mg Oral TID  . insulin aspart  0-5 Units Subcutaneous QHS  . insulin aspart  0-9 Units Subcutaneous TID WC  . polyethylene glycol  17 g Oral Daily  . sodium chloride flush  3 mL Intravenous Q12H   Continuous Infusions: . sodium chloride 75 mL/hr at 12/07/17 0040     LOS: 0 days    Time spent: 25 mins.More than 50% of that time was spent in counseling and/or coordination of care.      Burnadette Pop, MD [facility] Pager [phone]  If 7PM-7AM, please contact night-coverage [email or web address] Password [email or web address] 12/07/2017, 1:29 PM

## 2017-12-07 NOTE — Progress Notes (Signed)
     Subjective: 2 Days Post-Op Procedure(s) (LRB): MRI WITH ANESTHESIA (N/A) Awake,alert and oriented x 4. Severe pain  Patient reports pain as marked.    Objective:   VITALS:  Temp:  [97 F (36.1 C)-99.2 F (37.3 C)] 97.3 F (36.3 C) (04/12 1600) Pulse Rate:  [60-96] 80 (04/12 1600) Resp:  [8-21] 21 (04/12 1600) BP: (137-145)/(61-91) 138/64 (04/12 1600) SpO2:  [97 %-100 %] 100 % (04/12 1600) Weight:  [209 lb 7 oz (95 kg)] 209 lb 7 oz (95 kg) (04/12 0445)  Neurologically intact ABD soft Neurovascular intact Sensation intact distally Intact pulses distally Dorsiflexion/Plantar flexion intact Incision: healed L3-4 midline.   LABS Recent Labs    12/05/17 0356 12/06/17 0510  HGB 10.8* 10.0*  WBC 9.0 9.6  PLT 349 318   Recent Labs    12/05/17 0356  NA 137  K 4.1  CL 102  CO2 26  BUN 12  CREATININE 0.83  GLUCOSE 126*   No results for input(s): LABPT, INR in the last 72 hours. Interventional Radiology unwilling to perform transforamenal ESIs due to the history of plavix prior to hospitalization.   Assessment/Plan: 2 Days Post-Op Procedure(s) (LRB): MRI WITH ANESTHESIA (N/A)  Pain pattern is severe neurogenic claudication left leg with standing and walking Left L3 radiculopathy,he is not able to stand for more than 3-4 minutes and unable to walk more than 30 feet with need to sit or recline to decrease pain associated with left L3 and L4 severe foramenal stenosis. I do not think he will be able to see improvement in his activity level with out decompression. The concern is a collapsing pattern of foramenal entrapment that is  Worsened by a preexisting scoliosis and L4-5 spondylolisthesis and severe foramenal narrowing. Following his bilateral discectomies at the L3-4 level the degree of foramenal narrowing is worsened and causing claudication.   Advance diet Up with therapy  He will need decompression of the L3-4 and L4-5 levels and fusion to prevent recurrent  foramenal stenosis. His pain pattern With radicular pain that is severe and L3 and improves with stooping or sitting but is severe to the point where he can not  Walk even 30 feet without having to sit.  I will plan to perform a decompression with interbody fusion left L3-4 and L4-5 with pedicle screws and rods from L3 to L5. He has anemia and will need 2 units of blood available for surgery As I expect he may need transfusion perioperatively. Surgery will likely be 6-7 hour duration. I discussed the situation with patient and his family and Dr. Renford DillsAdhikari and I will plan for surgery on Tuesday and post op expect 3 day recovery to where he may be able to go home. I will plan to transfer to my service post surgery.  [name] 12/07/2017, 5:13 PMPatient ID: Jeffrey Nelson, male   DOB: 10/09/1934, 83 y.o.   MRN: [id number]

## 2017-12-07 NOTE — Progress Notes (Signed)
Spoke with OT about a TENS unit.  OT stated that this was usually associated with out patient services vs inpatient, and that TENS units aren't available in the hospital.

## 2017-12-07 NOTE — Progress Notes (Signed)
OT Cancellation Note  Patient Details Name: Jeffrey Nelson MRN: [id number] DOB: 08/22/1935   Cancelled Treatment:    Reason Eval/Treat Not Completed: Patient declined, no reason specified. Pt adamantly refusing therapy services; reports he doesn't want to increase pain with mobility or exercises at bed level. Educated pt and family on benefits of weight bearing and mobility; pt verbalizes understanding but continues to decline participation in therapy at this time.   Gaye AlkenBailey A Vasiliki Smaldone M.S., OTR/L Pager: [phone]  12/07/2017, 3:07 PM

## 2017-12-08 LAB — BASIC METABOLIC PANEL
Anion gap: 7 (ref 5–15)
BUN: 9 mg/dL (ref 6–20)
CHLORIDE: 100 mmol/L — AB (ref 101–111)
CO2: 29 mmol/L (ref 22–32)
Calcium: 9 mg/dL (ref 8.9–10.3)
Creatinine, Ser: 0.73 mg/dL (ref 0.61–1.24)
GFR calc non Af Amer: >60 mL/min (ref >60)
Glucose, Bld: 118 mg/dL — ABNORMAL HIGH (ref 65–99)
POTASSIUM: 3.8 mmol/L (ref 3.5–5.1)
SODIUM: 136 mmol/L (ref 135–145)

## 2017-12-08 LAB — CBC WITH DIFFERENTIAL/PLATELET
BASOS PCT: 1 %
Basophils Absolute: 0.1 10*3/uL (ref 0.0–0.1)
EOS ABS: 0.2 10*3/uL (ref 0.0–0.7)
Eosinophils Relative: 2 %
HCT: 33.1 % — ABNORMAL LOW (ref 39.0–52.0)
HEMOGLOBIN: 10.9 g/dL — AB (ref 13.0–17.0)
LYMPHS ABS: 1.8 10*3/uL (ref 0.7–4.0)
Lymphocytes Relative: 17 %
MCH: 31.7 pg (ref 26.0–34.0)
MCHC: 32.9 g/dL (ref 30.0–36.0)
MCV: 96.2 fL (ref 78.0–100.0)
Monocytes Absolute: 2.1 10*3/uL — ABNORMAL HIGH (ref 0.1–1.0)
Monocytes Relative: 20 %
NEUTROS PCT: 60 %
Neutro Abs: 6.4 10*3/uL (ref 1.7–7.7)
Platelets: 291 10*3/uL (ref 150–400)
RBC: 3.44 MIL/uL — ABNORMAL LOW (ref 4.22–5.81)
RDW: 12.8 % (ref 11.5–15.5)
WBC: 10.5 10*3/uL (ref 4.0–10.5)

## 2017-12-08 LAB — CULTURE, BLOOD (ROUTINE X 2)
Culture: NO GROWTH
Culture: NO GROWTH
SPECIAL REQUESTS: ADEQUATE
Special Requests: ADEQUATE

## 2017-12-08 LAB — GLUCOSE, CAPILLARY
GLUCOSE-CAPILLARY: 103 mg/dL — AB (ref 65–99)
GLUCOSE-CAPILLARY: 138 mg/dL — AB (ref 65–99)
GLUCOSE-CAPILLARY: 125 mg/dL — AB (ref 65–99)
Glucose-Capillary: 112 mg/dL — ABNORMAL HIGH (ref 65–99)
Glucose-Capillary: 100 mg/dL — ABNORMAL HIGH (ref 65–99)

## 2017-12-08 NOTE — Progress Notes (Signed)
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  He was found to have significant hypotension and was sent to the emergency room.  In the ED he was found to be orthostatic, he was given IV fluids and was admitted to the hospital. Currently he is being managed for back pain.  Assessment & Plan:   Active Problems:   Diabetes mellitus type 2, uncomplicated (HCC)   Status post lumbar laminectomy   Syncope   Dehydration   Chronic back pain   Hypotension   Subclavian artery stenosis, right (HCC)  Chronic back pain -H/O previous microdiscectomies -Orthopedics following -As per Ortho,  MRI of the L-spine obtained: Result: 1. Status post interval LEFT L3-4 and suspected L4-5 redo laminectomies. Exuberant discogenic endplate changes L3-4, less likely discitis osteomyelitis. 2. Recent RIGHT L3-4 hemilaminectomy. Stable 6 x 9 mm fluid collection within RIGHT surgical bed equivocal for abscess, however there is new enhancing RIGHT L3-4 facet effusion and mild widening may be infectious or inflammatory. 3. New 0.9 x 2.2 x 4.9 cm subacute hematoma at L5-S1 anteriorly displacing the cauda equina. 4. No canal stenosis. L2-3 through L4-5 neural foraminal narrowing: Severe at L3-4  - Ortho planning for decompression surgery on Tuesday Discussed plan with Dr. Otelia Sergeant on 12/07/17 -PT /OT continues. Continue  pain regimen.  On gabapentin.On dilaudid.Dose increased. -Patient complains of severe low back pain radiating to his left thigh -Low suspicion for infectious etiology on the above MRI findings.   Syncopal  episode -Patient with significant hypotension in the orthopedics office, he states that he has been having poor p.o. intake, may be related to dehydration. -Patient without any chest pain/symptoms/palpitations, troponin has remained negative -Echo showed normal ejection fraction,grade 1 diastolic dysfunction -Carotid dopplers with obstruction -Currently his blood pressure stable. -PT/OT following  Hypotension -Patient has been receiving steroids in the past and has had steroid injections, cortisol was checked and it was found to be decreased to 2.   -Cosyntropin stimulation test done  with improvement of cortisone level suggesting normal response.  No need on starting steroids.  His blood pressure stable -Antihypertensives on hold  Type 2 diabetes mellitus -Continue sliding scale, hemoglobin A1c less than 7 which shows good control  Coronary artery disease -No chest pain, no cardiac symptoms. Was on Plavix at home.Currently held for upcoming surgery and findings on the MRI   DVT prophylaxis: SCD Code Status: Full Family Communication:Discussed with daughter on the phone Disposition Plan: To be determined.As per Orthopedics   Consultants: Orthopedic surgery, cardiology  Procedures: None  Antimicrobials: None  Subjective: Patient seen and examined at bedside this morning.  He still complains of shooting pain on the left thigh radiating from back.  Objective: Vitals:   12/08/17 0400 12/08/17 0747 12/08/17 0800 12/08/17 1200  BP: 126/71 136/75 138/78 (!) 135/59  Pulse: 87 83 95 84  Resp: 11 17 (!) 8 17  Temp:  98.3 F (36.8 C)  98.3 F (36.8 C)  TempSrc:  Oral  Oral  SpO2:  98% 100% 98%  Weight:      Height:        Intake/Output Summary (Last 24  hours) at 12/08/2017 1354 Last data filed at 12/08/2017 1135 Gross per 24 hour  Intake 540 ml  Output 2500 ml  Net -1960 ml   Filed Weights   12/05/17 0621 12/06/17 0600 12/07/17 0445  Weight: 93.2 kg (205 lb 7.5 oz)  93.9 kg (207 lb 0.2 oz) 95 kg (209 lb 7 oz)    Examination:  General exam: Appears calm and comfortable ,Not in distress,average built HEENT:PERRL,Oral mucosa moist, Ear/Nose normal on gross exam Respiratory system: Bilateral equal air entry, normal vesicular breath sounds, no wheezes or crackles  Cardiovascular system: S1 & S2 heard, RRR. No JVD, murmurs, rubs, gallops or clicks. Gastrointestinal system: Abdomen is nondistended, soft and nontender. No organomegaly or masses felt. Normal bowel sounds heard. Central nervous system: Alert and oriented. No focal neurological deficits. Extremities: No edema, no clubbing ,no cyanosis, distal peripheral pulses palpable. Skin: No rashes, lesions or ulcers,no icterus ,no pallor MSK:  Normal muscle bulk,tone ,power.Severe tenderness on the lower back Psychiatry: Judgement and insight appear normal. Mood & affect appropriate.    Data Reviewed: I have personally reviewed following labs and imaging studies  CBC: Recent Labs  Lab 12/03/17 1533 12/04/17 0342 12/05/17 0356 12/06/17 0510 12/08/17 0747  WBC 15.2* 11.5* 9.0 9.6 10.5  NEUTROABS 11.9*  --   --  7.9* 6.4  HGB 11.9* 10.3* 10.8* 10.0* 10.9*  HCT 36.2* 31.9* 32.7* 30.3* 33.1*  MCV 96.0 96.1 97.0 96.8 96.2  PLT 414* 354 349 318 291   Basic Metabolic Panel: Recent Labs  Lab 12/03/17 1533 12/04/17 0342 12/05/17 0356 12/08/17 0747  NA 137 136 137 136  K 4.4 4.1 4.1 3.8  CL 97* 103 102 100*  CO2 24 25 26 29   GLUCOSE 137* 138* 126* 118*  BUN 24* 19 12 9   CREATININE 1.21 1.00 0.83 0.73  CALCIUM 9.1 9.1 9.2 9.0  MG  --  1.6*  --   --   PHOS  --  3.2  --   --    GFR: Estimated Creatinine Clearance: 84.2 mL/min (by C-G formula based on SCr of 0.73 mg/dL). Liver Function Tests: Recent Labs  Lab 12/03/17 1533 12/04/17 0342  AST 25 18  ALT 16* 15*  ALKPHOS 165* 132*  BILITOT 0.5 0.6  PROT 6.8 5.7*  ALBUMIN 3.4* 2.9*   No results for input(s): LIPASE, AMYLASE in the last  168 hours. No results for input(s): AMMONIA in the last 168 hours. Coagulation Profile: No results for input(s): INR, PROTIME in the last 168 hours. Cardiac Enzymes: Recent Labs  Lab 12/03/17 2147 12/04/17 0342 12/04/17 0558  TROPONINI <0.03 <0.03 <0.03   BNP (last 3 results) No results for input(s): PROBNP in the last 8760 hours. HbA1C: No results for input(s): HGBA1C in the last 72 hours. CBG: Recent Labs  Lab 12/07/17 1631 12/07/17 2056 12/08/17 0646 12/08/17 0749 12/08/17 1205  GLUCAP 115* 127* 112* 103* 138*   Lipid Profile: No results for input(s): CHOL, HDL, LDLCALC, TRIG, CHOLHDL, LDLDIRECT in the last 72 hours. Thyroid Function Tests: No results for input(s): TSH, T4TOTAL, FREET4, T3FREE, THYROIDAB in the last 72 hours. Anemia Panel: No results for input(s): VITAMINB12, FOLATE, FERRITIN, TIBC, IRON, RETICCTPCT in the last 72 hours. Sepsis Labs: Recent Labs  Lab 12/03/17 1654 12/03/17 1955  LATICACIDVEN 1.96* 1.13    Recent Results (from the past 240 hour(s))  Culture, blood (routine x 2)     Status: None (Preliminary result)   Collection Time: 12/03/17  6:00 PM  Result  Value Ref Range Status   Specimen Description BLOOD RIGHT ANTECUBITAL  Final   Special Requests   Final    BOTTLES DRAWN AEROBIC AND ANAEROBIC Blood Culture adequate volume   Culture   Final    NO GROWTH 4 DAYS Performed at [facility], [address] [place], [place] [address]    Report Status PENDING  Incomplete  Culture, blood (routine x 2)     Status: None (Preliminary result)   Collection Time: 12/03/17  6:15 PM  Result Value Ref Range Status   Specimen Description BLOOD LEFT WRIST  Final   Special Requests   Final    BOTTLES DRAWN AEROBIC AND ANAEROBIC Blood Culture adequate volume   Culture   Final    NO GROWTH 4 DAYS Performed at [facility], [address] [place], [place] [address]    Report Status PENDING  Incomplete  MRSA PCR Screening     Status: None    Collection Time: 12/04/17  2:24 AM  Result Value Ref Range Status   MRSA by PCR NEGATIVE NEGATIVE Final    Comment:        The GeneXpert MRSA Assay (FDA approved for NASAL specimens only), is one component of a comprehensive MRSA colonization surveillance program. It is not intended to diagnose MRSA infection nor to guide or monitor treatment for MRSA infections. Performed at [facility], [address] [place], [place] [address]          Radiology Studies: No results found.      Scheduled Meds: . aspirin EC  81 mg Oral Daily  . feeding supplement (ENSURE ENLIVE)  237 mL Oral BID BM  . gabapentin  300 mg Oral TID  . insulin aspart  0-5 Units Subcutaneous QHS  . insulin aspart  0-9 Units Subcutaneous TID WC  . polyethylene glycol  17 g Oral Daily  . sodium chloride flush  3 mL Intravenous Q12H   Continuous Infusions: . sodium chloride 75 mL/hr at 12/07/17 1600     LOS: 1 day    Time spent: 25 mins.More than 50% of that time was spent in counseling and/or coordination of care.      Burnadette PopAmrit Hedi Barkan, MD [facility] Pager [phone]  If 7PM-7AM, please contact night-coverage [email or web address] Password [email or web address] 12/08/2017, 1:54 PM

## 2017-12-09 LAB — GLUCOSE, CAPILLARY
Glucose-Capillary: 124 mg/dL — ABNORMAL HIGH (ref 65–99)
Glucose-Capillary: 175 mg/dL — ABNORMAL HIGH (ref 65–99)
Glucose-Capillary: 134 mg/dL — ABNORMAL HIGH (ref 65–99)

## 2017-12-09 MED ORDER — DEXTROSE 5 % IV SOLN
500.0000 mg | Freq: Four times a day (QID) | INTRAVENOUS | Status: DC | PRN
Start: 2017-12-09 — End: 2017-12-17
  Filled 2017-12-09: qty 5

## 2017-12-09 MED ORDER — GABAPENTIN 400 MG PO CAPS
400.0000 mg | ORAL_CAPSULE | Freq: Three times a day (TID) | ORAL | Status: DC
  Administered 2017-12-09 – 2017-12-12 (×9): 400 mg via ORAL
  Filled 2017-12-09 (×9): qty 1

## 2017-12-09 NOTE — Progress Notes (Signed)
Pt converted to Afib. Primary Attending page with change in rhythm. Pt is scheduled for surgery 4/16 and Plavix is on hold.

## 2017-12-09 NOTE — Progress Notes (Signed)
PROGRESS NOTE    Jeffrey Nelson  RUE:[id number] DOB: 01-16-1935 DOA: 12/03/2017 PCP: Deloris Ping, MD   Brief Narrative: This is an 83 year old male with history of coronary artery disease with prior MIs status post CABG, prior CVA, diabetes mellitus type 2, hypertension, who went to his orthopedic surgery office  for excruciating severe back pain, and while in office patient felt lightheaded, dizzy and passed out.  He was also found to have significant hypotension and was sent to the emergency room.  In the ED he was found to be orthostatic, he was given IV fluids and was admitted to the hospital. Currently he is being managed for back pain.  Assessment & Plan:   Active Problems:   Diabetes mellitus type 2, uncomplicated (HCC)   Status post lumbar laminectomy   Syncope   Dehydration   Chronic back pain   Hypotension   Subclavian artery stenosis, right (HCC)  Chronic back pain -H/O previous microdiscectomies -Orthopedics following -As per Ortho,  MRI of the L-spine obtained: Result: 1. Status post interval LEFT L3-4 and suspected L4-5 redo laminectomies. Exuberant discogenic endplate changes L3-4, less likely discitis osteomyelitis. 2. Recent RIGHT L3-4 hemilaminectomy. Stable 6 x 9 mm fluid collection within RIGHT surgical bed equivocal for abscess, however there is new enhancing RIGHT L3-4 facet effusion and mild widening may be infectious or inflammatory. 3. New 0.9 x 2.2 x 4.9 cm subacute hematoma at L5-S1 anteriorly displacing the cauda equina. 4. No canal stenosis. L2-3 through L4-5 neural foraminal narrowing: Severe at L3-4  - Ortho planning for decompression surgery on Tuesday Discussed plan with Dr. Otelia Sergeant on 12/07/17 -PT /OT continues. Continue  pain regimen.  On gabapentin.On dilaudid.Dose increased for both. Also ordered Robaxin today because patient continues to have pain. -Patient complains of  low back pain radiating to his left thigh which is stabbing  in nature when he tries to walk or stand. -Low suspicion for infectious etiology on the above MRI findings.   Syncopal episode -Patient with significant hypotension in the orthopedics office, he states that he has been having poor p.o. intake, may be related to dehydration. -Patient without any chest pain/symptoms/palpitations, troponin has remained negative -Echo showed normal ejection fraction,grade 1 diastolic dysfunction -Carotid dopplers with obstruction -Currently his blood pressure stable. -PT/OT following  Hypotension -Patient has been receiving steroids in the past and has had steroid injections, cortisol was checked and it was found to be decreased to 2.   -Cosyntropin stimulation test done  with improvement of cortisone level suggesting normal response.  No need on starting steroids.  His blood pressure stable -Antihypertensives on hold  Type 2 diabetes mellitus -Continue sliding scale, hemoglobin A1c less than 7 which shows good control  Coronary artery disease -No chest pain, no cardiac symptoms. Was on Plavix at home.Currently held for upcoming surgery and findings on the MRI   DVT prophylaxis: SCD Code Status: Full Family Communication:Discussed with daughter on the phone on 12/08/17 Disposition Plan: To be determined.As per Orthopedics   Consultants: Orthopedic surgery, cardiology  Procedures: None  Antimicrobials: None  Subjective: Patient seen and examined the bedside this morning.  Sitting in the chair.  Continues to complain of pain radiating from his back to the left thigh.  He says his pain is not better.  Objective: Vitals:   12/09/17 0300 12/09/17 0400 12/09/17 0500 12/09/17 0900  BP:  131/61    Pulse:  91    Resp:  13    Temp: 98.5 F (36.9  C)   97.6 F (36.4 C)  TempSrc: Oral     SpO2:  91%    Weight:   91.8 kg (202 lb 6.1 oz)   Height:        Intake/Output Summary (Last 24 hours) at 12/09/2017 1043 Last data filed at 12/09/2017  0400 Gross per 24 hour  Intake 3162 ml  Output 1225 ml  Net 1937 ml   Filed Weights   12/06/17 0600 12/07/17 0445 12/09/17 0500  Weight: 93.9 kg (207 lb 0.2 oz) 95 kg (209 lb 7 oz) 91.8 kg (202 lb 6.1 oz)    Examination:  General exam: Appears calm and comfortable ,Not in distress,average built HEENT:PERRL,Oral mucosa moist, Ear/Nose normal on gross exam Respiratory system: Bilateral equal air entry, normal vesicular breath sounds, no wheezes or crackles  Cardiovascular system: S1 & S2 heard, RRR. No JVD, murmurs, rubs, gallops or clicks. Gastrointestinal system: Abdomen is nondistended, soft and nontender. No organomegaly or masses felt. Normal bowel sounds heard. Central nervous system: Alert and oriented. No focal neurological deficits. Extremities: No edema, no clubbing ,no cyanosis, distal peripheral pulses palpable. Skin: No rashes, lesions or ulcers,no icterus ,no pallor MSK: Normal muscle bulk,tone ,power, tenderness on the lower back Psychiatry: Judgement and insight appear normal. Mood & affect appropriate.    Data Reviewed: I have personally reviewed following labs and imaging studies  CBC: Recent Labs  Lab 12/03/17 1533 12/04/17 0342 12/05/17 0356 12/06/17 0510 12/08/17 0747  WBC 15.2* 11.5* 9.0 9.6 10.5  NEUTROABS 11.9*  --   --  7.9* 6.4  HGB 11.9* 10.3* 10.8* 10.0* 10.9*  HCT 36.2* 31.9* 32.7* 30.3* 33.1*  MCV 96.0 96.1 97.0 96.8 96.2  PLT 414* 354 349 318 291   Basic Metabolic Panel: Recent Labs  Lab 12/03/17 1533 12/04/17 0342 12/05/17 0356 12/08/17 0747  NA 137 136 137 136  K 4.4 4.1 4.1 3.8  CL 97* 103 102 100*  CO2 24 25 26 29   GLUCOSE 137* 138* 126* 118*  BUN 24* 19 12 9   CREATININE 1.21 1.00 0.83 0.73  CALCIUM 9.1 9.1 9.2 9.0  MG  --  1.6*  --   --   PHOS  --  3.2  --   --    GFR: Estimated Creatinine Clearance: 84.2 mL/min (by C-G formula based on SCr of 0.73 mg/dL). Liver Function Tests: Recent Labs  Lab 12/03/17 1533  12/04/17 0342  AST 25 18  ALT 16* 15*  ALKPHOS 165* 132*  BILITOT 0.5 0.6  PROT 6.8 5.7*  ALBUMIN 3.4* 2.9*   No results for input(s): LIPASE, AMYLASE in the last 168 hours. No results for input(s): AMMONIA in the last 168 hours. Coagulation Profile: No results for input(s): INR, PROTIME in the last 168 hours. Cardiac Enzymes: Recent Labs  Lab 12/03/17 2147 12/04/17 0342 12/04/17 0558  TROPONINI <0.03 <0.03 <0.03   BNP (last 3 results) No results for input(s): PROBNP in the last 8760 hours. HbA1C: No results for input(s): HGBA1C in the last 72 hours. CBG: Recent Labs  Lab 12/08/17 0749 12/08/17 1205 12/08/17 1722 12/08/17 2157 12/09/17 0739  GLUCAP 103* 138* 100* 125* 124*   Lipid Profile: No results for input(s): CHOL, HDL, LDLCALC, TRIG, CHOLHDL, LDLDIRECT in the last 72 hours. Thyroid Function Tests: No results for input(s): TSH, T4TOTAL, FREET4, T3FREE, THYROIDAB in the last 72 hours. Anemia Panel: No results for input(s): VITAMINB12, FOLATE, FERRITIN, TIBC, IRON, RETICCTPCT in the last 72 hours. Sepsis Labs: Recent Labs  Lab  12/03/17 1654 12/03/17 1955  LATICACIDVEN 1.96* 1.13    Recent Results (from the past 240 hour(s))  Culture, blood (routine x 2)     Status: None   Collection Time: 12/03/17  6:00 PM  Result Value Ref Range Status   Specimen Description BLOOD RIGHT ANTECUBITAL  Final   Special Requests   Final    BOTTLES DRAWN AEROBIC AND ANAEROBIC Blood Culture adequate volume   Culture   Final    NO GROWTH 5 DAYS Performed at [facility], [address] [place], [place] [address]    Report Status 12/08/2017 FINAL  Final  Culture, blood (routine x 2)     Status: None   Collection Time: 12/03/17  6:15 PM  Result Value Ref Range Status   Specimen Description BLOOD LEFT WRIST  Final   Special Requests   Final    BOTTLES DRAWN AEROBIC AND ANAEROBIC Blood Culture adequate volume   Culture   Final    NO GROWTH 5 DAYS Performed at [facility], [address] [place], [place] [address]    Report Status 12/08/2017 FINAL  Final  MRSA PCR Screening     Status: None   Collection Time: 12/04/17  2:24 AM  Result Value Ref Range Status   MRSA by PCR NEGATIVE NEGATIVE Final    Comment:        The GeneXpert MRSA Assay (FDA approved for NASAL specimens only), is one component of a comprehensive MRSA colonization surveillance program. It is not intended to diagnose MRSA infection nor to guide or monitor treatment for MRSA infections. Performed at [facility], [address] [place], [place] [address]          Radiology Studies: No results found.      Scheduled Meds: . aspirin EC  81 mg Oral Daily  . feeding supplement (ENSURE ENLIVE)  237 mL Oral BID BM  . gabapentin  400 mg Oral TID  . insulin aspart  0-5 Units Subcutaneous QHS  . insulin aspart  0-9 Units Subcutaneous TID WC  . polyethylene glycol  17 g Oral Daily  . sodium chloride flush  3 mL Intravenous Q12H   Continuous Infusions: . sodium chloride 75 mL/hr at 12/09/17 0550  . methocarbamol (ROBAXIN)  IV       LOS: 2 days    Time spent: 25 mins.More than 50% of that time was spent in counseling and/or coordination of care.      Burnadette PopAmrit Derrian Poli, MD [facility] Pager [phone]  If 7PM-7AM, please contact night-coverage [email or web address] Password [email or web address] 12/09/2017, 10:43 AM

## 2017-12-09 NOTE — Progress Notes (Signed)
1900: Handoff report received from RN. Pt resting in bed. Discussed plan of care for the shift; pt amenable to plan.  2100: Assisted pt in ambulating to the bathroom. Upon return to bed, pt c/o 10/10 pain in leg. PRN pain medication administered. At reassessment, pt denies pain.  0000: Pt resting comfortably.  0400: Pt continues resting comfortably.  0700: Handoff report given to RN. No acute events overnight.

## 2017-12-09 NOTE — Progress Notes (Signed)
1900: Handoff report received from RN. Pt resting in bed. Discussed plan of care for the shift; pt hesitant to mobilize r/t pain. Pt states that he will continue to refuse physical therapy or ambulating further than the bathroom until after his surgery as he does not see the benefit of subjecting himself to increased pain.  0000: Pt resting comfortably.  0300: Pt called out for assistance washing up. Pt reports he was dreaming and became confused upon waking and had a single episode of incontinence of bladder. The pt assisted in washing himself up and was alert and oriented throughout.  0400: Pt continues resting comfortably.  0700: Handoff report given to RN. No acute events overnight.

## 2017-12-09 NOTE — Progress Notes (Signed)
Pt daughters are requesting a neuro consult before surgery 4/16.

## 2017-12-10 ENCOUNTER — Encounter (INDEPENDENT_AMBULATORY_CARE_PROVIDER_SITE_OTHER): Payer: Self-pay | Admitting: Specialist

## 2017-12-10 LAB — GLUCOSE, CAPILLARY
GLUCOSE-CAPILLARY: 162 mg/dL — AB (ref 65–99)
Glucose-Capillary: 125 mg/dL — ABNORMAL HIGH (ref 65–99)
Glucose-Capillary: 104 mg/dL — ABNORMAL HIGH (ref 65–99)
Glucose-Capillary: 109 mg/dL — ABNORMAL HIGH (ref 65–99)
Glucose-Capillary: 132 mg/dL — ABNORMAL HIGH (ref 65–99)
Glucose-Capillary: 122 mg/dL — ABNORMAL HIGH (ref 65–99)

## 2017-12-10 MED ORDER — CHLORHEXIDINE GLUCONATE 4 % EX LIQD
60.0000 mL | Freq: Once | CUTANEOUS | Status: AC
  Administered 2017-12-11: 4 via TOPICAL
  Filled 2017-12-10 (×2): qty 60

## 2017-12-10 NOTE — Progress Notes (Signed)
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  He was also found to have significant hypotension and was sent to the emergency room.  In the ED he was found to be orthostatic, he was given IV fluids and was admitted to the hospital. Currently he is being managed for back pain.  Assessment & Plan:   Active Problems:   Diabetes mellitus type 2, uncomplicated (HCC)   Status post lumbar laminectomy   Syncope   Dehydration   Chronic back pain   Hypotension   Subclavian artery stenosis, right (HCC)  Chronic back pain -H/O previous microdiscectomies -Orthopedics following -As per Ortho,  MRI of the L-spine obtained: Result: 1. Status post interval LEFT L3-4 and suspected L4-5 redo laminectomies. Exuberant discogenic endplate changes L3-4, less likely discitis osteomyelitis. 2. Recent RIGHT L3-4 hemilaminectomy. Stable 6 x 9 mm fluid collection within RIGHT surgical bed equivocal for abscess, however there is new enhancing RIGHT L3-4 facet effusion and mild widening may be infectious or inflammatory. 3. New 0.9 x 2.2 x 4.9 cm subacute hematoma at L5-S1 anteriorly displacing the cauda equina. 4. No canal stenosis. L2-3 through L4-5 neural foraminal narrowing: Severe at L3-4  -Pain has improved today. - Ortho planning for decompression surgery on Tuesday Discussed plan with Dr. Otelia Sergeant on 12/07/17 -PT /OT continues. Continue  pain regimen.  On gabapentin.On dilaudid.Dose increased for both. Also on robaxin. -Patient complains of  low back pain radiating to his left thigh which is stabbing in nature when he tries  to walk or stand. -Low suspicion for infectious etiology on the above MRI findings.   Syncopal episode -Patient with significant hypotension in the orthopedics office, he states that he has been having poor p.o. intake, may be related to dehydration. -Patient without any chest pain/symptoms/palpitations, troponin has remained negative -Echo showed normal ejection fraction,grade 1 diastolic dysfunction -Carotid dopplers with obstruction -Currently his blood pressure stable. -PT/OT following  Hypotension -Patient has been receiving steroids in the past and has had steroid injections, cortisol was checked and it was found to be decreased to 2.   -Cosyntropin stimulation test done  with improvement of cortisone level suggesting normal response.  No need on starting steroids.  His blood pressure stable -Antihypertensives on hold  Type 2 diabetes mellitus -Continue sliding scale, hemoglobin A1c less than 7 which shows good control  Coronary artery disease -No chest pain, no cardiac symptoms. Was on Plavix at home.Currently held for upcoming surgery and findings on the MRI   DVT prophylaxis: SCD Code Status: Full Family Communication:Discussed with daughter on the phone on 12/08/17 Disposition Plan: To be determined.As per Orthopedics   Consultants: Orthopedic surgery, cardiology  Procedures: None  Antimicrobials: None  Subjective: Patient seen and examined the bedside this morning.  Remains comfortable today.  States that his pain is better for the first time. Awaiting surgery for tomorrow.  Objective: Vitals:   12/10/17 0300 12/10/17 0500 12/10/17 0800 12/10/17 1200  BP:  (!) 168/74 (!) 168/73 134/75  Pulse:  89 91 79  Resp:  18 15 17   Temp: 98.3 F (36.8 C)  97.8 F (36.6 C)   TempSrc:  Oral  Oral   SpO2:  99% 94% 98%  Weight:  93 kg (205 lb 0.4 oz)    Height:        Intake/Output Summary (Last 24 hours) at 12/10/2017 1258 Last data filed at 12/10/2017 1200 Gross  per 24 hour  Intake 3240 ml  Output 2850 ml  Net 390 ml   Filed Weights   12/07/17 0445 12/09/17 0500 12/10/17 0500  Weight: 95 kg (209 lb 7 oz) 91.8 kg (202 lb 6.1 oz) 93 kg (205 lb 0.4 oz)    Examination:  General exam: Appears calm and comfortable ,Not in distress,average built HEENT:PERRL,Oral mucosa moist, Ear/Nose normal on gross exam Respiratory system: Bilateral equal air entry, normal vesicular breath sounds, no wheezes or crackles  Cardiovascular system: S1 & S2 heard, RRR. No JVD, murmurs, rubs, gallops or clicks. Gastrointestinal system: Abdomen is nondistended, soft and nontender. No organomegaly or masses felt. Normal bowel sounds heard. Central nervous system: Alert and oriented. No focal neurological deficits. Extremities: No edema, no clubbing ,no cyanosis, distal peripheral pulses palpable. Skin: No rashes, lesions or ulcers,no icterus ,no pallor MSK: Normal muscle bulk,tone ,power Psychiatry: Judgement and insight appear normal. Mood & affect appropriate.     Data Reviewed: I have personally reviewed following labs and imaging studies  CBC: Recent Labs  Lab 12/03/17 1533 12/04/17 0342 12/05/17 0356 12/06/17 0510 12/08/17 0747  WBC 15.2* 11.5* 9.0 9.6 10.5  NEUTROABS 11.9*  --   --  7.9* 6.4  HGB 11.9* 10.3* 10.8* 10.0* 10.9*  HCT 36.2* 31.9* 32.7* 30.3* 33.1*  MCV 96.0 96.1 97.0 96.8 96.2  PLT 414* 354 349 318 291   Basic Metabolic Panel: Recent Labs  Lab 12/03/17 1533 12/04/17 0342 12/05/17 0356 12/08/17 0747  NA 137 136 137 136  K 4.4 4.1 4.1 3.8  CL 97* 103 102 100*  CO2 24 25 26 29   GLUCOSE 137* 138* 126* 118*  BUN 24* 19 12 9   CREATININE 1.21 1.00 0.83 0.73  CALCIUM 9.1 9.1 9.2 9.0  MG  --  1.6*  --   --   PHOS  --  3.2  --   --    GFR: Estimated Creatinine Clearance: 84.2 mL/min (by C-G formula based on SCr of 0.73 mg/dL). Liver Function Tests: Recent Labs  Lab 12/03/17 1533 12/04/17 0342  AST 25 18  ALT 16* 15*  ALKPHOS  165* 132*  BILITOT 0.5 0.6  PROT 6.8 5.7*  ALBUMIN 3.4* 2.9*   No results for input(s): LIPASE, AMYLASE in the last 168 hours. No results for input(s): AMMONIA in the last 168 hours. Coagulation Profile: No results for input(s): INR, PROTIME in the last 168 hours. Cardiac Enzymes: Recent Labs  Lab 12/03/17 2147 12/04/17 0342 12/04/17 0558  TROPONINI <0.03 <0.03 <0.03   BNP (last 3 results) No results for input(s): PROBNP in the last 8760 hours. HbA1C: No results for input(s): HGBA1C in the last 72 hours. CBG: Recent Labs  Lab 12/09/17 1638 12/09/17 2121 12/10/17 0530 12/10/17 0713 12/10/17 1209  GLUCAP 175* 134* 104* 109* 162*   Lipid Profile: No results for input(s): CHOL, HDL, LDLCALC, TRIG, CHOLHDL, LDLDIRECT in the last 72 hours. Thyroid Function Tests: No results for input(s): TSH, T4TOTAL, FREET4, T3FREE, THYROIDAB in the last 72 hours. Anemia Panel: No results for input(s): VITAMINB12, FOLATE, FERRITIN, TIBC, IRON, RETICCTPCT in the last 72 hours. Sepsis Labs: Recent Labs  Lab 12/03/17 1654 12/03/17 1955  LATICACIDVEN 1.96* 1.13    Recent Results (  from the past 240 hour(s))  Culture, blood (routine x 2)     Status: None   Collection Time: 12/03/17  6:00 PM  Result Value Ref Range Status   Specimen Description BLOOD RIGHT ANTECUBITAL  Final   Special Requests   Final    BOTTLES DRAWN AEROBIC AND ANAEROBIC Blood Culture adequate volume   Culture   Final    NO GROWTH 5 DAYS Performed at [facility], [address] [place], [place] [address]    Report Status 12/08/2017 FINAL  Final  Culture, blood (routine x 2)     Status: None   Collection Time: 12/03/17  6:15 PM  Result Value Ref Range Status   Specimen Description BLOOD LEFT WRIST  Final   Special Requests   Final    BOTTLES DRAWN AEROBIC AND ANAEROBIC Blood Culture adequate volume   Culture   Final    NO GROWTH 5 DAYS Performed at [facility], [address], [place], [place]  [address]    Report Status 12/08/2017 FINAL  Final  MRSA PCR Screening     Status: None   Collection Time: 12/04/17  2:24 AM  Result Value Ref Range Status   MRSA by PCR NEGATIVE NEGATIVE Final    Comment:        The GeneXpert MRSA Assay (FDA approved for NASAL specimens only), is one component of a comprehensive MRSA colonization surveillance program. It is not intended to diagnose MRSA infection nor to guide or monitor treatment for MRSA infections. Performed at [facility], [address], [place], [place] [address]          Radiology Studies: No results found.      Scheduled Meds: . aspirin EC  81 mg Oral Daily  . chlorhexidine  60 mL Topical Once  . feeding supplement (ENSURE ENLIVE)  237 mL Oral BID BM  . gabapentin  400 mg Oral TID  . insulin aspart  0-5 Units Subcutaneous QHS  . insulin aspart  0-9 Units Subcutaneous TID WC  . polyethylene glycol  17 g Oral Daily  . sodium chloride flush  3 mL Intravenous Q12H   Continuous Infusions: . methocarbamol (ROBAXIN)  IV       LOS: 3 days    Time spent: 25 mins.More than 50% of that time was spent in counseling and/or coordination of care.      Burnadette Pop, MD [facility] Pager [phone]  If 7PM-7AM, please contact night-coverage [email or web address] Password [facility] 12/10/2017, 12:58 PM

## 2017-12-10 NOTE — Progress Notes (Signed)
Nutrition Follow-up  DOCUMENTATION CODES:   Not applicable  INTERVENTION:  Continue Ensure Enlive po BID, each supplement provides 350 kcal and 20 grams of protein.  Encourage adequate PO intake.   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   Meal completion has been 25-100%. Pt reports appetite has been improving and he has been trying to eat more at meals. Pt reports he was still eating well PTA with usual consumption of at least 2-3 meals a day. Pt with a 10% weight loss in 2 months. Pt does report this has been intentional and he has been consume smaller portions at meals. Pt currently has Ensure ordered and has been consuming them. Pt educated on adequate caloric and protein needs on healing. Plans for surgery tomorrow for left TLIFs L3-4 and L4-5.  NUTRITION - FOCUSED PHYSICAL EXAM:    Most Recent Value  Orbital Region  No depletion  Upper Arm Region  No depletion  Thoracic and Lumbar Region  No depletion  Buccal Region  No depletion  Temple Region  No depletion  Clavicle Bone Region  No depletion  Clavicle and Acromion Bone Region  No depletion  Scapular Bone Region  No depletion  Dorsal Hand  No depletion  Patellar Region  No depletion  Anterior Thigh Region  No depletion  Posterior Calf Region  No depletion  Edema (RD Assessment)  None  Hair  Reviewed  Eyes  Reviewed  Mouth  Reviewed  Skin  Reviewed  Nails  Reviewed       Diet Order:  Diet heart healthy/carb modified Room service  appropriate? Yes; Fluid consistency: Thin Diet NPO time specified  EDUCATION NEEDS:   Not appropriate for education at this time  Skin:  Skin Assessment: Reviewed RN Assessment  Last BM:  4/13  Height:   Ht Readings from Last 1 Encounters:  12/06/17 '6\' 2"'  (1.88 m)    Weight:   Wt Readings from Last 1 Encounters:  12/10/17 205 lb 0.4 oz (93 kg)    Ideal Body Weight:  86.36 kg  BMI:  Body mass index is 26.32 kg/m.  Estimated Nutritional Needs:   Kcal:  2050-2300  Protein:  90-100 grams  Fluid:  >/= 2 L/day    Corrin Parker, MS, RD, LDN Pager # [phone] After hours/ weekend pager # [phone]

## 2017-12-10 NOTE — Progress Notes (Addendum)
     Subjective: 5 Days Post-Op Procedure(s) (LRB): MRI WITH ANESTHESIA (N/A) Still severe pain with standing and attempting to walk. Pain is left L3 distribution consistent with neurogenic claudication Patient reports pain as severe.    Objective:   VITALS:  Temp:  [97.8 F (36.6 C)-98.6 F (37 C)] 97.8 F (36.6 C) (04/15 0800) Pulse Rate:  [77-93] 91 (04/15 0800) Resp:  [12-20] 15 (04/15 0800) BP: (137-168)/(64-74) 168/73 (04/15 0800) SpO2:  [93 %-99 %] 94 % (04/15 0800) Weight:  [205 lb 0.4 oz (93 kg)] 205 lb 0.4 oz (93 kg) (04/15 0500)  ABD soft Sensation intact distally Intact pulses distally Dorsiflexion/Plantar flexion intact Incision: Incision L3-4 is healed in midline lumbar.  No cellulitis present Healed lumbar incision. Pain pattern is severe claudication left thigh   LABS Recent Labs    12/08/17 0747  HGB 10.9*  WBC 10.5  PLT 291   Recent Labs    12/08/17 0747  NA 136  K 3.8  CL 100*  CO2 29  BUN 9  CREATININE 0.73  GLUCOSE 118*   No results for input(s): LABPT, INR in the last 72 hours.   Assessment/Plan: 5 Days Post-Op Procedure(s) (LRB): MRI WITH ANESTHESIA (N/A)  NPO past Midnight for left TLIFs L3-4 and L4-5.  Family requested Neurology consult and I called Neurologist on Call Dr. Jerrell BelfastAurora and he indicated that he may see him but that there was likely nothing that can be done since the problem is structural and will require surgery.Marland Kitchen. Genia Hotterjen  Marte Celani 12/10/2017, 11:32 AMPatient ID: Jeffrey Nelson, male   DOB: Aug 08, 1935, 83 y.o.   MRN: [id number]

## 2017-12-10 NOTE — Progress Notes (Signed)
PT Cancellation Note  Patient Details Name: Leafy HalfMelvin W Frith MRN: [id number] DOB: 1935-04-28   Cancelled Treatment:    Reason Eval/Treat Not Completed: Pain limiting ability to participate(pt reports 10/10 pain currently in sitting and denies therapy at this time stating he will wait until after surgery tomorrow. Will attempt post op as medically appropriate)   Stephaney Steven B Skiler Tye 12/10/2017, 7:54 AM  Delaney MeigsMaija Tabor Bill Mcvey, PT [phone]

## 2017-12-10 NOTE — Progress Notes (Signed)
Patient was [facility] via ambulance due to hypotension and loss of consciousness. This occurred before he was seen by myself.

## 2017-12-10 NOTE — Patient Instructions (Signed)
[facility] was [facility] due to hypotension and loss of consciousness.

## 2017-12-10 NOTE — Progress Notes (Signed)
1900: Handoff report received from RN. Pt resting in chair asking for his "shot," meaning IV pain medication, but reporting his pain as a 5/10. Pt educated on pain scaling and medication administration; refused tylenol. Discussed plan of care for the shift; pt amenable to plan.  2000: Pt calling out for his "shot" after being assisted back to bed from the chair. Pt now states that his pain is 10/10 and that this happens whenever he laid down. I suggested that we reposition him since the pain intensified with his changed in position from the chair; pt declined and requested medication, specifically dilaudid. Pt educated again on the importance of accurate pain reporting.  0000: Pt resting comfortably.  0400: Pt continues resting comfortably.  0600: Pt received hibacleanse for pre-op.  0700: Handoff report given to RN. No acute events overnight.

## 2017-12-11 ENCOUNTER — Inpatient Hospital Stay (HOSPITAL_COMMUNITY): Payer: Medicare HMO | Admitting: Certified Registered"

## 2017-12-11 ENCOUNTER — Encounter (HOSPITAL_COMMUNITY): Payer: Self-pay

## 2017-12-11 ENCOUNTER — Inpatient Hospital Stay (HOSPITAL_COMMUNITY): Payer: Medicare HMO

## 2017-12-11 ENCOUNTER — Encounter (HOSPITAL_COMMUNITY)
Admission: EM | Disposition: A | Discharge status: Home / Self Care | Payer: Self-pay | Source: Home / Self Care | Attending: Internal Medicine

## 2017-12-11 DIAGNOSIS — M48062 Spinal stenosis, lumbar region with neurogenic claudication: Principal | ICD-10-CM

## 2017-12-11 DIAGNOSIS — Z9889 Other specified postprocedural states: Secondary | ICD-10-CM

## 2017-12-11 DIAGNOSIS — M5417 Radiculopathy, lumbosacral region: Secondary | ICD-10-CM

## 2017-12-11 LAB — GLUCOSE, CAPILLARY
GLUCOSE-CAPILLARY: 120 mg/dL — AB (ref 65–99)
GLUCOSE-CAPILLARY: 111 mg/dL — AB (ref 65–99)
GLUCOSE-CAPILLARY: 137 mg/dL — AB (ref 65–99)
GLUCOSE-CAPILLARY: 201 mg/dL — AB (ref 65–99)

## 2017-12-11 LAB — PREPARE RBC (CROSSMATCH)

## 2017-12-11 SURGERY — POSTERIOR LUMBAR FUSION 2 LEVEL
Anesthesia: General | Laterality: Left

## 2017-12-11 SURGERY — POSTERIOR LUMBAR FUSION 2 LEVEL
Anesthesia: General | Site: Spine Lumbar

## 2017-12-11 MED ORDER — HEMOSTATIC AGENTS (NO CHARGE) OPTIME
TOPICAL | Status: DC | PRN
  Administered 2017-12-11: 1 via TOPICAL

## 2017-12-11 MED ORDER — ONDANSETRON HCL 4 MG/2ML IJ SOLN
INTRAMUSCULAR | Status: AC
  Filled 2017-12-11: qty 2

## 2017-12-11 MED ORDER — THROMBIN 20000 UNITS EX SOLR
CUTANEOUS | Status: AC
  Filled 2017-12-11: qty 20000

## 2017-12-11 MED ORDER — BUPIVACAINE HCL (PF) 0.5 % IJ SOLN
INTRAMUSCULAR | Status: AC
  Filled 2017-12-11: qty 30

## 2017-12-11 MED ORDER — SUFENTANIL CITRATE 50 MCG/ML IV SOLN
INTRAVENOUS | Status: AC
  Filled 2017-12-11: qty 1

## 2017-12-11 MED ORDER — ONDANSETRON HCL 4 MG/2ML IJ SOLN
INTRAMUSCULAR | Status: DC | PRN
  Administered 2017-12-11: 4 mg via INTRAVENOUS

## 2017-12-11 MED ORDER — PHENYLEPHRINE 40 MCG/ML (10ML) SYRINGE FOR IV PUSH (FOR BLOOD PRESSURE SUPPORT)
PREFILLED_SYRINGE | INTRAVENOUS | Status: AC
  Filled 2017-12-11: qty 10

## 2017-12-11 MED ORDER — ESMOLOL HCL 100 MG/10ML IV SOLN
INTRAVENOUS | Status: AC
  Filled 2017-12-11: qty 20

## 2017-12-11 MED ORDER — SODIUM CHLORIDE 0.9 % IV SOLN: Freq: Once | INTRAVENOUS | Status: DC

## 2017-12-11 MED ORDER — SODIUM CHLORIDE 0.9 % IJ SOLN
INTRAMUSCULAR | Status: AC
  Filled 2017-12-11: qty 10

## 2017-12-11 MED ORDER — LACTATED RINGERS IV SOLN
INTRAVENOUS | Status: DC
  Administered 2017-12-11 (×2): via INTRAVENOUS

## 2017-12-11 MED ORDER — BUPIVACAINE HCL 0.5 % IJ SOLN
INTRAMUSCULAR | Status: DC | PRN
  Administered 2017-12-11: 5 mL

## 2017-12-11 MED ORDER — PHENYLEPHRINE HCL 10 MG/ML IJ SOLN
INTRAMUSCULAR | Status: DC | PRN
  Administered 2017-12-11: 40 ug/min via INTRAVENOUS

## 2017-12-11 MED ORDER — PHENYLEPHRINE HCL 10 MG/ML IJ SOLN
INTRAMUSCULAR | Status: AC
  Filled 2017-12-11: qty 1

## 2017-12-11 MED ORDER — LIDOCAINE 2% (20 MG/ML) 5 ML SYRINGE
INTRAMUSCULAR | Status: DC | PRN
  Administered 2017-12-11: 100 mg via INTRAVENOUS

## 2017-12-11 MED ORDER — SUFENTANIL CITRATE 50 MCG/ML IV SOLN
INTRAVENOUS | Status: DC | PRN
  Administered 2017-12-11 (×2): 5 ug via INTRAVENOUS
  Administered 2017-12-11: 15 ug via INTRAVENOUS
  Administered 2017-12-11 (×2): 5 ug via INTRAVENOUS

## 2017-12-11 MED ORDER — SUGAMMADEX SODIUM 200 MG/2ML IV SOLN
INTRAVENOUS | Status: DC | PRN
  Administered 2017-12-11: 200 mg via INTRAVENOUS

## 2017-12-11 MED ORDER — PHENYLEPHRINE HCL 10 MG/ML IJ SOLN
INTRAMUSCULAR | Status: DC | PRN
  Administered 2017-12-11: 150 ug via INTRAVENOUS
  Administered 2017-12-11: 80 ug via INTRAVENOUS
  Administered 2017-12-11: 150 ug via INTRAVENOUS
  Administered 2017-12-11: 80 ug via INTRAVENOUS
  Administered 2017-12-11: 100 ug via INTRAVENOUS
  Administered 2017-12-11: 40 ug via INTRAVENOUS

## 2017-12-11 MED ORDER — DEXAMETHASONE SODIUM PHOSPHATE 10 MG/ML IJ SOLN
INTRAMUSCULAR | Status: DC | PRN
  Administered 2017-12-11: 10 mg via INTRAVENOUS

## 2017-12-11 MED ORDER — ROCURONIUM BROMIDE 10 MG/ML (PF) SYRINGE
PREFILLED_SYRINGE | INTRAVENOUS | Status: AC
  Filled 2017-12-11: qty 10

## 2017-12-11 MED ORDER — EPHEDRINE SULFATE 50 MG/ML IJ SOLN
INTRAMUSCULAR | Status: DC | PRN
  Administered 2017-12-11 (×2): 5 mg via INTRAVENOUS

## 2017-12-11 MED ORDER — ALBUMIN HUMAN 5 % IV SOLN
INTRAVENOUS | Status: DC | PRN
  Administered 2017-12-11 (×2): via INTRAVENOUS

## 2017-12-11 MED ORDER — BUPIVACAINE LIPOSOME 1.3 % IJ SUSP
20.0000 mL | Freq: Once | INTRAMUSCULAR | Status: AC
  Administered 2017-12-11: 5 mL
  Filled 2017-12-11: qty 20

## 2017-12-11 MED ORDER — SODIUM CHLORIDE 0.9 % IV SOLN
INTRAVENOUS | Status: DC | PRN
  Administered 2017-12-11 (×2): via INTRAVENOUS

## 2017-12-11 MED ORDER — PROMETHAZINE HCL 25 MG/ML IJ SOLN
6.2500 mg | INTRAMUSCULAR | Status: DC | PRN
Start: 2017-12-11 — End: 2017-12-11

## 2017-12-11 MED ORDER — FENTANYL CITRATE (PF) 100 MCG/2ML IJ SOLN
25.0000 ug | INTRAMUSCULAR | Status: DC | PRN
  Administered 2017-12-11 (×2): 25 ug via INTRAVENOUS

## 2017-12-11 MED ORDER — SUGAMMADEX SODIUM 200 MG/2ML IV SOLN
INTRAVENOUS | Status: AC
  Filled 2017-12-11: qty 2

## 2017-12-11 MED ORDER — CEFAZOLIN SODIUM 1 G IJ SOLR
INTRAMUSCULAR | Status: AC
  Filled 2017-12-11: qty 20

## 2017-12-11 MED ORDER — 0.9 % SODIUM CHLORIDE (POUR BTL) OPTIME
TOPICAL | Status: DC | PRN
  Administered 2017-12-11: 1000 mL

## 2017-12-11 MED ORDER — CEFAZOLIN SODIUM-DEXTROSE 2-4 GM/100ML-% IV SOLN
2.0000 g | INTRAVENOUS | Status: AC
  Administered 2017-12-11 (×2): 2 g via INTRAVENOUS
  Filled 2017-12-11 (×3): qty 100

## 2017-12-11 MED ORDER — THROMBIN (RECOMBINANT) 20000 UNITS EX SOLR
CUTANEOUS | Status: DC | PRN
  Administered 2017-12-11: 12:00:00 via TOPICAL

## 2017-12-11 MED ORDER — PROPOFOL 10 MG/ML IV BOLUS
INTRAVENOUS | Status: DC | PRN
  Administered 2017-12-11: 150 mg via INTRAVENOUS

## 2017-12-11 MED ORDER — FENTANYL CITRATE (PF) 100 MCG/2ML IJ SOLN
INTRAMUSCULAR | Status: AC
  Filled 2017-12-11: qty 2

## 2017-12-11 MED ORDER — ROCURONIUM BROMIDE 10 MG/ML (PF) SYRINGE
PREFILLED_SYRINGE | INTRAVENOUS | Status: DC | PRN
Start: 2017-12-11 — End: 2017-12-11
  Administered 2017-12-11: 50 mg via INTRAVENOUS
  Administered 2017-12-11 (×2): 20 mg via INTRAVENOUS

## 2017-12-11 MED ORDER — LIDOCAINE 2% (20 MG/ML) 5 ML SYRINGE
INTRAMUSCULAR | Status: AC
  Filled 2017-12-11: qty 5

## 2017-12-11 SURGICAL SUPPLY — 93 items
ADH SKN CLS APL DERMABOND .7 (GAUZE/BANDAGES/DRESSINGS) ×1
BLADE CLIPPER SURG (BLADE) IMPLANT
BONE CANC CHIPS 20CC PCAN1/4 (Bone Implant) ×3 IMPLANT
BONE VIVIGEN FORMABLE 5.4CC (Bone Implant) ×3 IMPLANT
BUR MATCHSTICK NEURO 3.0 LAGG (BURR) ×3 IMPLANT
BUR RND FLUTED 2.5 (BURR) IMPLANT
BUR SABER RD CUTTING 3.0 (BURR) IMPLANT
BUR SABER RD CUTTING 3.0MM (BURR)
CHIPS CANC BONE 20CC PCAN1/4 (Bone Implant) ×1 IMPLANT
CONNECTOR SFX SIZE A4 (Orthopedic Implant) ×3 IMPLANT
COVER BACK TABLE 80X110 HD (DRAPES) ×3 IMPLANT
COVER MAYO STAND STRL (DRAPES) ×3 IMPLANT
COVER SURGICAL LIGHT HANDLE (MISCELLANEOUS) IMPLANT
DERMABOND ADVANCED (GAUZE/BANDAGES/DRESSINGS) ×2
DERMABOND ADVANCED .7 DNX12 (GAUZE/BANDAGES/DRESSINGS) ×1 IMPLANT
DRAPE C-ARM 42X72 X-RAY (DRAPES) ×3 IMPLANT
DRAPE C-ARMOR (DRAPES) ×3 IMPLANT
DRAPE INCISE IOBAN 66X45 STRL (DRAPES) ×3 IMPLANT
DRAPE MICROSCOPE LEICA (MISCELLANEOUS) ×3 IMPLANT
DRAPE SURG 17X23 STRL (DRAPES) ×9 IMPLANT
DRSG MEPILEX BORDER 4X4 (GAUZE/BANDAGES/DRESSINGS) ×3 IMPLANT
DRSG MEPILEX BORDER 4X8 (GAUZE/BANDAGES/DRESSINGS) ×3 IMPLANT
DRSG PAD ABDOMINAL 8X10 ST (GAUZE/BANDAGES/DRESSINGS) ×3 IMPLANT
DURAPREP 26ML APPLICATOR (WOUND CARE) ×3 IMPLANT
DURASEAL SPINE SEALANT 5 POLY (MISCELLANEOUS) ×3 IMPLANT
ELECT BLADE 4.0 EZ CLEAN MEGAD (MISCELLANEOUS) ×3
ELECT BLADE 6.5 EXT (BLADE) IMPLANT
ELECT CAUTERY BLADE 6.4 (BLADE) ×3 IMPLANT
ELECT REM PT RETURN 9FT ADLT (ELECTROSURGICAL) ×3
ELECTRODE BLDE 4.0 EZ CLN MEGD (MISCELLANEOUS) ×1 IMPLANT
ELECTRODE REM PT RTRN 9FT ADLT (ELECTROSURGICAL) ×1 IMPLANT
EVACUATOR 1/8 PVC DRAIN (DRAIN) ×3 IMPLANT
FLOSEAL 10ML (HEMOSTASIS) ×3 IMPLANT
GLOVE BIO SURGEON STRL SZ7 (GLOVE) ×6 IMPLANT
GLOVE BIOGEL PI IND STRL 6.5 (GLOVE) ×1 IMPLANT
GLOVE BIOGEL PI IND STRL 7.0 (GLOVE) ×2 IMPLANT
GLOVE BIOGEL PI IND STRL 7.5 (GLOVE) ×1 IMPLANT
GLOVE BIOGEL PI IND STRL 8 (GLOVE) ×2 IMPLANT
GLOVE BIOGEL PI INDICATOR 6.5 (GLOVE) ×2
GLOVE BIOGEL PI INDICATOR 7.0 (GLOVE) ×4
GLOVE BIOGEL PI INDICATOR 7.5 (GLOVE) ×2
GLOVE BIOGEL PI INDICATOR 8 (GLOVE) ×4
GLOVE ECLIPSE 9.0 STRL (GLOVE) ×3 IMPLANT
GLOVE ORTHO TXT STRL SZ7.5 (GLOVE) ×6 IMPLANT
GLOVE SURG 8.5 LATEX PF (GLOVE) ×6 IMPLANT
GOWN STRL REUS W/ TWL LRG LVL3 (GOWN DISPOSABLE) IMPLANT
GOWN STRL REUS W/ TWL XL LVL3 (GOWN DISPOSABLE) ×2 IMPLANT
GOWN STRL REUS W/TWL 2XL LVL3 (GOWN DISPOSABLE) ×12 IMPLANT
GOWN STRL REUS W/TWL LRG LVL3 (GOWN DISPOSABLE)
GOWN STRL REUS W/TWL XL LVL3 (GOWN DISPOSABLE) ×6
KIT BASIN OR (CUSTOM PROCEDURE TRAY) ×3 IMPLANT
KIT POSITION SURG JACKSON T1 (MISCELLANEOUS) ×3 IMPLANT
KIT TURNOVER KIT B (KITS) ×3 IMPLANT
MANIFOLD NEPTUNE II (INSTRUMENTS) IMPLANT
NEEDLE 22X1 1/2 (OR ONLY) (NEEDLE) ×6 IMPLANT
NEEDLE ASP BONE MRW 11GX15 (NEEDLE) IMPLANT
NEEDLE BONE MARROW 8GAX6 (NEEDLE) IMPLANT
NEEDLE SPNL 18GX3.5 QUINCKE PK (NEEDLE) ×3 IMPLANT
NS IRRIG 1000ML POUR BTL (IV SOLUTION) ×3 IMPLANT
PACK LAMINECTOMY ORTHO (CUSTOM PROCEDURE TRAY) ×3 IMPLANT
PAD ARMBOARD 7.5X6 YLW CONV (MISCELLANEOUS) ×9 IMPLANT
PATTIES SURGICAL .75X.75 (GAUZE/BANDAGES/DRESSINGS) IMPLANT
PATTIES SURGICAL 1X1 (DISPOSABLE) IMPLANT
ROD EXPEDIUM PER BENT 65MM (Rod) ×6 IMPLANT
RUBBERBAND STERILE (MISCELLANEOUS) ×6 IMPLANT
SCREW SET SINGLE INNER (Screw) ×18 IMPLANT
SCREW VIPER 7X45MM (Screw) ×6 IMPLANT
SCREW VIPER 7X50MM (Screw) ×12 IMPLANT
SPACER CONCORDE PRO 9X11X27 (Spacer) ×3 IMPLANT
SPACER CONCORDE PRO 9X9X27 (Spacer) ×3 IMPLANT
SPONGE LAP 4X18 X RAY DECT (DISPOSABLE) ×12 IMPLANT
SPONGE SURGIFOAM ABS GEL 100 (HEMOSTASIS) ×3 IMPLANT
STAPLER VISISTAT 35W (STAPLE) ×3 IMPLANT
SURGIFLO W/THROMBIN 8M KIT (HEMOSTASIS) IMPLANT
SUT VIC AB 0 CT1 27 (SUTURE) ×3
SUT VIC AB 0 CT1 27XBRD ANBCTR (SUTURE) ×1 IMPLANT
SUT VIC AB 1 CTX 36 (SUTURE) ×6
SUT VIC AB 1 CTX36XBRD ANBCTR (SUTURE) ×2 IMPLANT
SUT VIC AB 2-0 CT1 27 (SUTURE) ×3
SUT VIC AB 2-0 CT1 TAPERPNT 27 (SUTURE) ×1 IMPLANT
SUT VIC AB 3-0 X1 27 (SUTURE) ×3 IMPLANT
SYR 20CC LL (SYRINGE) ×3 IMPLANT
SYR CONTROL 10ML LL (SYRINGE) ×6 IMPLANT
TAP CANN VIPER 5.0 (TAP) ×3 IMPLANT
TAP CANN VIPER 6.0 (TAP) ×3 IMPLANT
TAP CANN VIPER 7.0 (TAP) ×3 IMPLANT
TAP VIPER MIS 4.35MM (TAP) ×3 IMPLANT
TAPE CLOTH SURG 4X10 WHT LF (GAUZE/BANDAGES/DRESSINGS) ×3 IMPLANT
TOWEL GREEN STERILE (TOWEL DISPOSABLE) ×3 IMPLANT
TOWEL GREEN STERILE FF (TOWEL DISPOSABLE) ×3 IMPLANT
TRAY FOLEY W/METER SILVER 16FR (SET/KITS/TRAYS/PACK) ×3 IMPLANT
WATER STERILE IRR 1000ML POUR (IV SOLUTION) ×3 IMPLANT
YANKAUER SUCT BULB TIP NO VENT (SUCTIONS) ×3 IMPLANT

## 2017-12-11 NOTE — Anesthesia Preprocedure Evaluation (Addendum)
Anesthesia Evaluation  Patient identified by MRN, date of birth, ID band Patient awake    Reviewed: Allergy & Precautions, NPO status , Patient's Chart, lab work & pertinent test results  History of Anesthesia Complications Negative for: history of anesthetic complications  Airway Mallampati: II  TM Distance: >3 FB Neck ROM: Full    Dental no notable dental hx. (+) Dental Advisory Given   Pulmonary former smoker,    Pulmonary exam normal        Cardiovascular hypertension, + angina + CAD, + Past MI and + CABG  Normal cardiovascular exam Rhythm:Regular + Systolic murmurs    Neuro/Psych CVA negative psych ROS   GI/Hepatic negative GI ROS, Neg liver ROS,   Endo/Other  diabetes, Type 2  Renal/GU negative Renal ROS     Musculoskeletal   Abdominal   Peds  Hematology   Anesthesia Other Findings   Reproductive/Obstetrics                            Anesthesia Physical Anesthesia Plan  ASA: III  Anesthesia Plan: General   Post-op Pain Management:    Induction: Intravenous  PONV Risk Score and Plan: 3 and Ondansetron, Dexamethasone and Diphenhydramine  Airway Management Planned: Oral ETT  Additional Equipment:   Intra-op Plan:   Post-operative Plan: Extubation in OR  Informed Consent: I have reviewed the patients History and Physical, chart, labs and discussed the procedure including the risks, benefits and alternatives for the proposed anesthesia with the patient or authorized representative who has indicated his/her understanding and acceptance.   Dental advisory given  Plan Discussed with: Anesthesiologist  Anesthesia Plan Comments:         Anesthesia Quick Evaluation

## 2017-12-11 NOTE — Progress Notes (Signed)
PROGRESS NOTE    Jeffrey Nelson  NWG:[id number] DOB: [birth year] DOA: 12/03/2017 PCP: Deloris Ping, MD   Brief Narrative: This is an 83 year old male with history of coronary artery disease with prior MIs status post CABG, prior CVA, diabetes mellitus type 2, hypertension, who went to his orthopedic surgery office  for excruciating severe back pain, and while in office patient felt lightheaded, dizzy and passed out.  He was also found to have significant hypotension and was sent to the emergency room.  In the ED he was found to be orthostatic, he was given IV fluids and was admitted to the hospital. Currently he is being managed for back pain.  Assessment & Plan:   Active Problems:   Diabetes mellitus type 2, uncomplicated (HCC)   Status post lumbar laminectomy   Syncope   Dehydration   Chronic back pain   Hypotension   Subclavian artery stenosis, right (HCC)  Chronic back pain -H/O previous microdiscectomies -Orthopedics following -As per Ortho,  MRI of the L-spine obtained: Result: 1. Status post interval LEFT L3-4 and suspected L4-5 redo laminectomies. Exuberant discogenic endplate changes L3-4, less likely discitis osteomyelitis. 2. Recent RIGHT L3-4 hemilaminectomy. Stable 6 x 9 mm fluid collection within RIGHT surgical bed equivocal for abscess, however there is new enhancing RIGHT L3-4 facet effusion and mild widening may be infectious or inflammatory. 3. New 0.9 x 2.2 x 4.9 cm subacute hematoma at L5-S1 anteriorly displacing the cauda equina. 4. No canal stenosis. L2-3 through L4-5 neural foraminal narrowing: Severe at L3-4  -Pain persists today - Ortho planning for decompression surgery today -PT /OT folllowing. Continue  pain regimen.  On gabapentin.On dilaudid.Dose increased for both. Also on robaxin. -Patient complains of  low back pain radiating to his left thigh which is stabbing in nature when he tries to walk or stand. -Low suspicion for infectious  etiology on the above MRI findings.   Syncopal episode -Patient with significant hypotension in the orthopedics office, he states that he has been having poor p.o. intake, may be related to dehydration. -Patient without any chest pain/symptoms/palpitations, troponin has remained negative -Echo showed normal ejection fraction,grade 1 diastolic dysfunction -Carotid dopplers with obstruction -Currently his blood pressure stable. -PT/OT following  Hypotension -Patient has been receiving steroids in the past and has had steroid injections, cortisol was checked and it was found to be decreased to 2.   -Cosyntropin stimulation test done  with improvement of cortisone level suggesting normal response.  No need on starting steroids.  His blood pressure stable -Antihypertensives were on hold. Can restart tomorrow after surgery.  Type 2 diabetes mellitus -Continue sliding scale, hemoglobin A1c less than 7 which shows good control  Coronary artery disease -No chest pain, no cardiac symptoms. Was on Plavix at home.Currently held for upcoming surgery and findings on the MRI.   As per discussion with Dr. Loni Muse will be transferred to Orthopedics service. Medicine will sign off. Please call again if needed.  DVT prophylaxis: SCD Code Status: Full Family Communication:Discussed with daughter on the phone on 12/08/17 Disposition Plan: To be determined.As per Orthopedics   Consultants: Orthopedic surgery, cardiology  Procedures: None  Antimicrobials: None  Subjective: Patient seen and examined the bedside this morning.  He had severe pain last night.  He is eagerly waiting for the surgery.  Hemodynamically stable.    Objective: Vitals:   12/11/17 0300 12/11/17 0400 12/11/17 0700 12/11/17 0900  BP:  135/78  132/86  Pulse:  75  100  Resp:  17    Temp: 97.8 F (36.6 C)  98.3 F (36.8 C) 98.9 F (37.2 C)  TempSrc: Oral   Oral  SpO2:  96%  98%  Weight:      Height:         Intake/Output Summary (Last 24 hours) at 12/11/2017 1125 Last data filed at 12/11/2017 0900 Gross per 24 hour  Intake 660 ml  Output 2750 ml  Net -2090 ml   Filed Weights   12/07/17 0445 12/09/17 0500 12/10/17 0500  Weight: 95 kg (209 lb 7 oz) 91.8 kg (202 lb 6.1 oz) 93 kg (205 lb 0.4 oz)    Examination:  General exam: Appears calm and comfortable ,Not in distress,average built HEENT:PERRL,Oral mucosa moist, Ear/Nose normal on gross exam Respiratory system: Bilateral equal air entry, normal vesicular breath sounds, no wheezes or crackles  Cardiovascular system: S1 & S2 heard, RRR. No JVD, murmurs, rubs, gallops or clicks. Gastrointestinal system: Abdomen is nondistended, soft and nontender. No organomegaly or masses felt. Normal bowel sounds heard. Central nervous system: Alert and oriented. No focal neurological deficits. Extremities: No edema, no clubbing ,no cyanosis, distal peripheral pulses palpable. Skin: No rashes, lesions or ulcers,no icterus ,no pallor MSK: Normal muscle bulk,tone ,power Psychiatry: Judgement and insight appear normal. Mood & affect appropriate.      Data Reviewed: I have personally reviewed following labs and imaging studies  CBC: Recent Labs  Lab 12/05/17 0356 12/06/17 0510 12/08/17 0747  WBC 9.0 9.6 10.5  NEUTROABS  --  7.9* 6.4  HGB 10.8* 10.0* 10.9*  HCT 32.7* 30.3* 33.1*  MCV 97.0 96.8 96.2  PLT 349 318 291   Basic Metabolic Panel: Recent Labs  Lab 12/05/17 0356 12/08/17 0747  NA 137 136  K 4.1 3.8  CL 102 100*  CO2 26 29  GLUCOSE 126* 118*  BUN 12 9  CREATININE 0.83 0.73  CALCIUM 9.2 9.0   GFR: Estimated Creatinine Clearance: 84.2 mL/min (by C-G formula based on SCr of 0.73 mg/dL). Liver Function Tests: No results for input(s): AST, ALT, ALKPHOS, BILITOT, PROT, ALBUMIN in the last 168 hours. No results for input(s): LIPASE, AMYLASE in the last 168 hours. No results for input(s): AMMONIA in the last 168  hours. Coagulation Profile: No results for input(s): INR, PROTIME in the last 168 hours. Cardiac Enzymes: No results for input(s): CKTOTAL, CKMB, CKMBINDEX, TROPONINI in the last 168 hours. BNP (last 3 results) No results for input(s): PROBNP in the last 8760 hours. HbA1C: No results for input(s): HGBA1C in the last 72 hours. CBG: Recent Labs  Lab 12/10/17 1654 12/10/17 2120 12/11/17 0451 12/11/17 0731 12/11/17 1056  GLUCAP 132* 122* 120* 111* 137*   Lipid Profile: No results for input(s): CHOL, HDL, LDLCALC, TRIG, CHOLHDL, LDLDIRECT in the last 72 hours. Thyroid Function Tests: No results for input(s): TSH, T4TOTAL, FREET4, T3FREE, THYROIDAB in the last 72 hours. Anemia Panel: No results for input(s): VITAMINB12, FOLATE, FERRITIN, TIBC, IRON, RETICCTPCT in the last 72 hours. Sepsis Labs: No results for input(s): PROCALCITON, LATICACIDVEN in the last 168 hours.  Recent Results (from the past 240 hour(s))  Culture, blood (routine x 2)     Status: None   Collection Time: 12/03/17  6:00 PM  Result Value Ref Range Status   Specimen Description BLOOD RIGHT ANTECUBITAL  Final   Special Requests   Final    BOTTLES DRAWN AEROBIC AND ANAEROBIC Blood Culture adequate volume   Culture   Final    NO GROWTH 5 DAYS  Performed at [facility], [address] [place], [place] [address]    Report Status 12/08/2017 FINAL  Final  Culture, blood (routine x 2)     Status: None   Collection Time: 12/03/17  6:15 PM  Result Value Ref Range Status   Specimen Description BLOOD LEFT WRIST  Final   Special Requests   Final    BOTTLES DRAWN AEROBIC AND ANAEROBIC Blood Culture adequate volume   Culture   Final    NO GROWTH 5 DAYS Performed at [facility], [address] [place], [place] [address]    Report Status 12/08/2017 FINAL  Final  MRSA PCR Screening     Status: None   Collection Time: 12/04/17  2:24 AM  Result Value Ref Range Status   MRSA by PCR NEGATIVE NEGATIVE Final     Comment:        The GeneXpert MRSA Assay (FDA approved for NASAL specimens only), is one component of a comprehensive MRSA colonization surveillance program. It is not intended to diagnose MRSA infection nor to guide or monitor treatment for MRSA infections. Performed at [facility], [address] [place], [place] [address]          Radiology Studies: No results found.      Scheduled Meds: . [MAR Hold] aspirin EC  81 mg Oral Daily  . [MAR Hold] feeding supplement (ENSURE ENLIVE)  237 mL Oral BID BM  . [MAR Hold] gabapentin  400 mg Oral TID  . [MAR Hold] insulin aspart  0-5 Units Subcutaneous QHS  . [MAR Hold] insulin aspart  0-9 Units Subcutaneous TID WC  . [MAR Hold] polyethylene glycol  17 g Oral Daily  . [MAR Hold] sodium chloride flush  3 mL Intravenous Q12H   Continuous Infusions: .  ceFAZolin (ANCEF) IV    . lactated ringers    . [MAR Hold] methocarbamol (ROBAXIN)  IV       LOS: 4 days    Time spent: 25 mins.More than 50% of that time was spent in counseling and/or coordination of care.      Burnadette PopAmrit Shelda Truby, MD [facility] Pager [phone]  If 7PM-7AM, please contact night-coverage [email or web address] Password [email or web address] 12/11/2017, 11:25 AM

## 2017-12-11 NOTE — Op Note (Signed)
12/11/2017  7:05 PM  PATIENT:  Jeffrey Nelson  83 y.o. male  MRN: [id number]  OPERATIVE REPORT  PRE-OPERATIVE DIAGNOSIS:  Lumbar three-four, Lumbar four-five forminal narrowing, scoliosis  POST-OPERATIVE DIAGNOSIS:  Lumbar three-four, Lumbar four-five forminal narrowing, scoliosis  PROCEDURE:  Procedure(s): LUMBAR THREE-FOUR,LUMBAR FOUR-FIVE TRANSFORAMINAL LUMBAR INTERBODY FUSION WITH PEDICLE SCREWS, RODS, CAGES, LOCAL AND ALLOGRAFT BONE GRAFT, VIVIGEN    SURGEON:  Jessy Oto, MD     ASSISTANT:  Benjiman Core, PA-C  (Present throughout the entire procedure and necessary for completion of procedure in a timely manner)     ANESTHESIA:  General, supplemented with local marcaine 0.25 % 1:1 exparel 1.3% total 20CC, Dr. Primary school teacher.     COMPLICATIONS: Right L5 initial bur of the facet placed medial, thecal sac explored no CSF leak or dural tear noted, the right lateral recess and L5 root covered with duraseal.  Left L4 medial pedicle wall fractured superficial and medial with screw insertion, able to obtain good fixation deep portion of the pedicle, no direct impression on the thecal sac The left L5 pedicle with lateral superficial pedicle fracture with screw placement also with good purchase deep pedicle and crosslink added to the contruct.     COMPONENTS:  Implant Name Type Inv. Item Serial No. Manufacturer Lot No. LRB No. Used  SCREW VIPER 7X50MM - [id number] Screw SCREW VIPER 7X50MM  [employer]  N/A 4  SCREW SET SINGLE INNER - [id number] Screw SCREW SET SINGLE INNER  [employer]  N/A 6  SCREW VIPER 7X45MM - [id number] Screw SCREW VIPER 7X45MM  [employer]  N/A 2  BONE CANC CHIPS 20CC - [id number] Bone Implant BONE CANC CHIPS 20CC [id number] [employer] [employer]  N/A 1  BONE VIVIGEN FORMABLE 5.4CC - [id number] Bone Implant BONE VIVIGEN FORMABLE 5.4CC [id number] [employer] [place] [employer]  N/A 1  concorde ProTi 5 DG     [employer] [id number] N/A 1  concorde proTi 5 dg    [employer] [id number] N/A 1  ROD EXPEDIUM PER BENT 65MM - [id number] Rod ROD EXPEDIUM PER BENT 65MM  [employer]  N/A 2  CONNECTOR SFX SIZE A4 - [id number] Orthopedic Implant CONNECTOR SFX SIZE A4  [employer]  N/A 1  FINDINGS: 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  PROCEDURE:    The patient was met in the holding area, and the appropriate left L3-4 and L4-5 lumbar levels identified and marked with an "X" and my initials. I had discussion with the patient in the preop holding area regarding consent form. 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  The patient was then transported to OR and was placed under general anestheticwithout difficulty. The patient received appropriate preoperative antibiotic prophylaxis with ancef 2 grams. . Nursing staff inserted a Foley catheter under sterile conditions. The patient was then turned to a prone position using the [place] spine frame. PAS. all pressure points well padded the arms at the side to 90 90. Standard prep with DuraPrep solution draped in the usual manner from the lower dorsal spine the mid sacral segment. Iodine Vi-Drape was used and the incision was marked. Time-out procedure was called and correct. Skin in the midline between L2and S2 was then infiltrated with Marcaine  0.25% 1:1 exparel 1.3% total of 20 cc used. Incision was then made through the skin and subcutaneous layers ellipsing the old incision scar down to the patient's lumbodorsal fascia and spinous processes. The incision then carried sharply excising the supraspinous ligament and then continuing the lateral aspect of the spinous processes L3 L4-L5. Cobb elevators used to carefully elevate the  paralumbar muscles off of the posterior elements using electrocautery carefully drilled bleeding and perform dissection of the muscle tissues of the preserving the facet at L2-3. Continuing the exposure out laterally to expose facet joints of L2-3, L3-4 and L4-5 using electrocautery monopolar electrocautery.L3-4 level and also at the L4-5 level observed on C-arm fluoroscopy. The level marked with the OR marking pen sterilely.Marland Kitchen Spinous processes of L4, and the upper 25% of L5 were then resected down to the base the lamina at each segment the lower 50% of the spinous process of L3 was resected and Leksell rongeur used to resect inferior aspect of the lamina on the right side at the L4 level and partially on the right side at L3. The left inferior articular process of L3 and L4 were resected in order to provide for exposure of the left side L3-4 and L4-5 neuroforamen for ease of placement of TLIFs (transforaminal lumbar interbody fusion) essential portions of the lamina were also resected first beginning with the Leksell rongeur and then resecting using 2 and 3 mm Kerrison.Laminectomy was carried out resecting the central portions of the lamina of L4 and upper 20% of L5 performing foraminotomies on the right side at the L4 and L5 levels. The inferior articular process L3 and L4 were resected on the left side. The L5 nerve root identified bilaterally and the medial aspect of the L5 pedicle. Superior articular process of L5 was then resected from the left side further decompressing the left L4 nerve and providing for exposure of the area just superior to the L5 pedicle for a placement of cage. Returning to the left side decompression was carried out along the left side recut resecting the superior articular process of L3overlying the L3 nerve root as it exited at the L3-4 level decompressing the lateral recess along the medial aspect of the pedicle of L3 and resecting the superior articular process of L4 and  decompressing the left L4 neuroforamen. Loupe magnification and headlight were used initially and then OR microscope sterilely draped during this portion procedure. At the L4-5 level similarly lateral recess and the neuroforamen were resected decompressing the L4 ad L5 nerve roots and foraminotomies was widely performed over the left L3 nerve and L4 nerve roots.C-arm fluoroscopy was then brought into the field and using C-arm fluoroscopy then a hole made into the medial aspect of the pedicle of L3 observed in the pedicle using ball-tipped nerve hook and hockey stick nerve probe initial entry was determined on fluoroscopy to be good position alignment so that a ball handled probe was then used to probe the left L3 pedicle to a depth of nearly 50 mm observed on C-arm fluoroscopy to be beyond the midpoint of the lumbar vertebra and then position alignment within the left L3 pedicle this was then removed and the pedicle channel probed demonstrating patency no sign of rupture the cortex of the pedicle. Tapping with a 5 mm screw tap then 6.0 mm then 7.69m tap then 7.0 mm x 50 mm screw was placed on table for placement on the left side at the L3 level after performing TLIFs. C-arm fluoroscopy was then brought  into the field and using C-arm fluoroscopy then a hole made into the medial aspect of the pedicle of right L3 observed in the pedicle using ball tipped nerve hook and hockey stick nerve probe initial entry was determined on fluoroscopy to be good position alignment so that a ball handled probe was then used to probe the right L3 pedicle to a depth of nearly 50 mm observed on C-arm fluoroscopy to be beyond the midpoint of the lumbar vertebra and then position alignment within the right L3 pedicle this was then removed and the pedicle channel probed demonstrating patency no sign of rupture the cortex of the pedicle. Tapping with a 4.19m then 5756mtap and then a 6 mm screw tap then a 7.56m72map and then a 7.0 mm x 50 mm  screw was placed on the right side at the right L3 level C-arm fluoroscopy was then brought into the field and using C-arm fluoroscopy then a hole made into the lateral aspect of the pedicle of left L4 observed in the pedicle using ball tipped nerve hook and hockey stick nerve probe initial entry was determined on fluoroscopy to be good position alignment so that a ball handled probe was then used to probe the left L4 pedicle to a depth of nearly 50 mm observed on C-arm fluoroscopy to be well aligned within the left L4 pedicle, the pedicle channel probed demonstrating patency no sign of rupture the cortex of the pedicle. Tapping with a 4.59m68men a 5.56mm 32m then 6 mm screw tap and then a 7.56mm t105m then 7.0 mm x 50 mm screw was placed on table for placement on the left side at the L4 level after performing TLIFs. C-arm fluoroscopy was used to localize the hole made in the mediall aspect of the pedicle of L4 on the right localizing the pedicle within the spinal canal with nerve hook and hockey-stick nerve probe carefully passed down the center of the L4 pedicle to a depth of nearly 50 mm. Observed on C-arm fluoroscopy to be in good position alignment channel was probed with a ball-tipped probe ensure patency no sign of cortical disruption. Following tapping with a 4.59mm t62m 5 mm and 6mm scr29mtap then a 7.56mm tap 55m.0 x 50 mm screw was placed on the right side pedicle at L4.C-arm fluoroscopy was used to localize the hole made in the lateral aspect of the pedicle of L5 on the left localizing the pedicle within the spinal canal with nerve hook and hockey-stick nerve probe carefully passed down the center of the L5 pedicle to a depth of nearly 45 mm. Observed on C-arm fluoroscopy to be in good position alignment channel was probed with a ball-tipped probe ensure patency no sign of cortical disruption. Following tapping with 4.59mm tap 1mthen a 5 mm tap, then 6mm tap an856mhen a 7.56mm tap and27m7.0 x 45 mm screw  was placed on table for placement on the left side at the L5 level after performing TLIFs. C-arm fluoroscopy was used to localize the hole made in the medial aspect of the pedicle of L5 on the right localizing the pedicle within the spinal canal with nerve hook and hockey-stick nerve probe carefully passed down the center of the L5 pedicle to a depth of nearly 45 mm. The first pass of the matchstick 3.56mm burr obs78med to be medial to the right L5 pedicle. A laminectomy was performed right L4-5 to ensure no active dural tear or CSF leak.  None was noted or found. The entry point was placed right L5 at the medial border of the inferior L5 pedicle observed on C-arm fluoroscopy to be in good position alignment channel was probed with a ball-tipped probe ensure patency no sign of cortical disruption. Following tapping with a 4.54m tap and then a 514mtap and then 6 mm tap and a 7.71m43map,  7.0 x 45 mm screw was placed on the right side pedicle at L5. Attention then turned to placement of the transforaminal lumbar interbody fusion cages. Using a Penfield 4 the lateral aspect of the thecal sac and the inferior aspect of the L4 nerve root on the left side at the L4-5 level was carefully drilled. The thecal sac could then be retracted with a Penfield #4 within the posterior lateral aspect of the L4-5 disc was exposed 15 blade scalpel used to incise then osteotome used to resect a small portion of bone off the superior aspect of the posterior superior vertebral body of L5 in order to ease the entry into the L4-5 disc space. A pituitary rongeur was then able to be introduced in the disc space debrided it of degenerative disc material. 7 mm dilator was used to dialate the L4-5 disc space on the left side attempts were made to dilate further in intrements to 9mm34mccessfully and using small curettes and the disc space was debrided a minimal degenerative disc present in the endplates debrided to bleeding endplate bone. A 9.0 mmx  27mm48mdotic Concorde cage was chosen and careful packed with morcellized bone graft and the been harvested from previous laminotomies and vivigen, additional local autogenous bone graft, allograft cancelous chips and vivigen was then packed into the intervertebral disc space using the 7 mm trial impacted the graft multiple times. With this then a 9.71mm x15m mm lordotic cage was introduced into the disc space on the left side in the correct degree convergence and then impacted then subset beneath the posterior aspect of the disc space by about 3 or 4 mm. Bleeding controlled using bipolar electrocautery thrombin soaked gel cottonoids. Then turned to the left L3-4 level similarly the exposure the posterior lateral aspect this was carried out using a Penfield 4 bipolar electrocautery to control small bleeders present. Derricho retractor used to retract the thecal sac and nerve root, a 15 blade scalpel was used to incise posterior lateral aspect of the disc at the L3-4 disc space.The space was debrided of degenerative disc material using pituitary along root the entire disc space was then debrided of degenerative disc material using pituitary rongeurs curettage down to bleeding bone endplates. Residual disc was resected using pituitary. This space was then carefully sounded to a 11 mm cage trial provided the best fit the lordotic cage was chosen the 11 mm x 27 mm. The intervertebral disc space was then packed with autogenous local bone graft that been harvested from the central laminectomy as well as allograft cancellous chips and vivigen and the trial was used to pack the graft using an 8 mm trial cage apparatus. This provided excellent bone graft within the intervertebral disc space at L3-4 so that the permanent 11 mm cage by 27 mm lordotic concord cage was then packed with local bone graft placed into the intervertebral disc space and impacted into place in the correct degree of convergence. Bleeding controlled  using bipolar electrocautery. The cage was subset beneath the posterior aspect of the about 3-4 mm . Bleeding was hemostasis attention was turned to  the right side where on the right side portion of the central laminectomy at the L4-5 levels carefully decompressing thecal sac and the appropriate nerve root L5 and L4 at the L4-5 level. Examination of the thecal sac with the OR microscope showed no sign of dural tear and the thecal sac pulsed normally. Duraseal was placed along the right lateral recess of L4-5.  The cages observed on C-arm fluoroscopy to be in good position alignment. With this then the transforaminal lumbar interbody fusion portion of the case was completed bleeders were controlled using bipolar electrocautery thrombin-soaked Gelfoam were appropriate. 3 screws on the left were then placed and 3 screws on the right already in place and then each carefully aligned and tightened or loose and a slight amount to allow for placement of rods. The 65 mm precontoured rods were then placed into the pedicle screws on the left and right extending from L3-5 each of the caps carefully placed loosely tightened. Attention turned to the right side were similarly and then screws were carefully adjusted to allow for a better pattern screws to allow for placement of fixation rod template for the rod was then taken and a precontoured quarter inch titanium rod was placed. The left L3 rod fastener cap was then tightened 85 pounds similarly on  At the left side at L4 and L5 screw fasteners compression was obtained on the left side between L3 and L4. And at L4 and L5 by compressing between the facets left side and  the screw caps tightened to 85 pounds. Similarly this was done on the left side at L4 and L5 screws were slightly compressed and tightened 85 pounds. Returning the right side then to the L3-4  level compression was obtained The screw L3 fasteners caps and the L4 fasteners caps were tightened to 80 foot-pounds  with the compressor across these segments and then at the L4-5 screws with the compressor in place. irrigation was carried out with copious amounts of saline solution this was done throughout the case. Cell Saver was used during the case and a total of 400 cc of cell saver blood was returned. The patient also received 2Units of PRBCs as he had significant blood loss and a starting Hgb of 10. A single cross-link for the Depuy system was placed at the L4-5 level measured with the measuring tool and the appropriate A4 cross-link was then carefully applied to the rods and tightened again to 80 foot-pounds bilaterally using the appropriate torque screwdriver. Center screw on the cross-link was then carefully tightened 80 foot-pounds irrigation was carried out observation showed no dural tear demonstrated no leakage present. Permanent C-arm images were obtained in AP, lateral and oblique planes. Remaining local bone graft was then applied along the left and right lateral posterior lateral region extending from L3 through L5 transverse processes. Excess Gelfoam was then removed the lumbodorsal musculature carefully exam debrided of any devitalized tissue following removal of Vicryl retractors were the bleeders were controlled using electrocautery and the area dorsal lumbar muscle were then approximated in the midline with interrupted #1 Vicryl sutures loose the dorsal fascia was re\re attached to the spinous process of L2 to superiorly and asked to history inferiorly this was done with #1 Vicryl sutures. A Hemovac drain was placed subcutaneously and subcutaneous layers then approximated over the drain using interrupted 0 Vicryl sutures and 2-0 Vicryl sutures. Skin was closed with a stainless steel staples then 4 inch MedPlex bandage, then ABD and hypofix tape. Hemovac drain was  pink taped in place.  All instrument and sponge counts were correct. The patient was then returned to a supine position on her bed reactivated  extubated and returned to the recovery room in satisfactory condition.    Benjiman Core, PA-C perform the duties of assistant surgeon during this case. He was present from the beginning of the case to the end of the case assisting in transfer the patient from his stretcher to the OR table and back to the stretcher at the end of the case. Assisted in careful retraction and suction of the laminectomy site delicate neural structures operating under the operating room microscope. He performed closure of the incision from the fascia to the skin applying the dressing.     Basil Dess  12/11/2017, 7:05 PM

## 2017-12-11 NOTE — Progress Notes (Signed)
OT Cancellation Note  Patient Details Name: Jeffrey Nelson MRN: [id number] DOB: [birth year]   Cancelled Treatment:    Reason Eval/Treat Not Completed: Patient at procedure or test/ unavailable.  Pt in OR.  Raylin Diguglielmo Norwoodonarpe, OTR/L [phone]   Jeani HawkingConarpe, Keven Soucy M 12/11/2017, 1:24 PM

## 2017-12-11 NOTE — Anesthesia Procedure Notes (Addendum)
Procedure Name: Intubation Date/Time: 12/11/2017 12:22 PM Performed by: Shireen QuanButler, Kito Cuffe R, CRNA Pre-anesthesia Checklist: Patient identified, Emergency Drugs available, Suction available, Patient being monitored and Timeout performed Patient Re-evaluated:Patient Re-evaluated prior to induction Oxygen Delivery Method: Circle system utilized Preoxygenation: Pre-oxygenation with 100% oxygen Induction Type: IV induction Ventilation: Mask ventilation without difficulty Laryngoscope Size: Miller and 3 Grade View: Grade II Tube type: Oral Tube size: 7.5 mm Number of attempts: 1 Airway Equipment and Method: Patient positioned with wedge pillow and Stylet Placement Confirmation: ETT inserted through vocal cords under direct vision,  positive ETCO2 and breath sounds checked- equal and bilateral Secured at: 22 cm Tube secured with: Tape Dental Injury: Teeth and Oropharynx as per pre-operative assessment

## 2017-12-11 NOTE — Care Management Important Message (Signed)
Important Message  Patient Details  Name: Leafy HalfMelvin W Dahle MRN: [id number] Date of Birth: [birth year]   Medicare Important Message Given:  Yes    Dorena BodoIris Janiesha Diehl 12/11/2017, 1:38 PM

## 2017-12-11 NOTE — Brief Op Note (Signed)
12/11/2017  7:00 PM  PATIENT:  Jeffrey Nelson  83 y.o. male  PRE-OPERATIVE DIAGNOSIS:  Lumbar three-four, Lumbar four-five forminal narrowing, scoliosis  POST-OPERATIVE DIAGNOSIS:  Lumbar three-four, Lumbar four-five forminal narrowing, scoliosis  PROCEDURE:  Procedure(s): LUMBAR THREE-FOUR,LUMBAR FOUR-FIVE TRANSFORAMINAL LUMBAR INTERBODY FUSION WITH PEDICLE SCREWS, RODS, CAGES, LOCAL AND ALLOGRAFT BONE GRAFT, VIVIGEN (N/A)  SURGEON:  Surgeon(s) and Role:    * Kerrin ChampagneNitka, Davian Hanshaw E, MD - Primary  PHYSICIAN ASSISTANT:Wynne Rozak Barry Dieneswens, PA-C   ANESTHESIA:   local and general  EBL:  1100 mL   BLOOD ADMINISTERED:500 CC PRBC and 400 CC CELLSAVER  DRAINS: (Medium) Hemovact drain(s) in the right subcutaneous lumbar with  Suction Open and Urinary Catheter (Foley)   LOCAL MEDICATIONS USED:  MARCAINE 0.25% 1:1 EXPAREL 1.3% Amount: 20 ml  SPECIMEN:  No Specimen  DISPOSITION OF SPECIMEN:  N/A  COUNTS:  YES  TOURNIQUET:  * No tourniquets in log *  DICTATION: .Dragon Dictation  PLAN OF CARE: Admit to inpatient   PATIENT DISPOSITION:  PACU - hemodynamically stable.   Delay start of Pharmacological VTE agent (>24hrs) due to surgical blood loss or risk of bleeding: yes

## 2017-12-11 NOTE — Discharge Instructions (Addendum)
° ° °  Call if there is increasing drainage, fever greater than 101.5, severe head aches, and worsening nausea or light sensitivity. If shortness of breath, bloody cough or chest tightness or pain go to an emergency room. No lifting greater than 10 lbs. Avoid bending, stooping and twisting. Use brace when sitting and out of bed even to go to bathroom. Walk in house for first 2 weeks then may start to get out slowly increasing distances up to one mile by 4-6 weeks post op. May shower and change dressing following bathing with shower.When bathing remove the brace shower and replace brace before getting out of the shower. If drainage, keep dry dressing and do not bathe the incision, use an moisture impervious dressing. Please call and return for scheduled follow up appointment 2 weeks from the time of surgery. Please make an appointment to See your primary care physician Dr. Elige Radonyker-Brown in one week to assess and continue to  monitor and treat hypertension, tachycardia and anemia.

## 2017-12-11 NOTE — Progress Notes (Signed)
Patient transferred to OR at this time. Alert and in stable condition.

## 2017-12-11 NOTE — Brief Op Note (Signed)
12/03/2017 - 12/11/2017  5:53 PM  PATIENT:  Leafy HalfMelvin W Ho  83 y.o. male  PRE-OPERATIVE DIAGNOSIS:  Lumbar three-four, Lumbar four-five forminal narrowing, scoliosis  POST-OPERATIVE DIAGNOSIS:  Lumbar three-four, Lumbar four-five forminal narrowing, scoliosis  PROCEDURE:  Procedure(s): LUMBAR THREE-FOUR,LUMBAR FOUR-FIVE TRANSFORAMINAL LUMBAR INTERBODY FUSION WITH PEDICLE SCREWS, RODS, CAGES, LOCAL AND ALLOGRAFT BONE GRAFT, VIVIGEN (N/A)  SURGEON:  Surgeon(s) and Role:    * Kerrin ChampagneNitka, Gabor Lusk E, MD - Primary  PHYSICIAN ASSISTANT: Zonia Kiefjames Staceyann Knouff pa-c   ANESTHESIA:   general  EBL:  1100 mL     LOCAL MEDICATIONS USED:  MARCAINE and exparel    SPECIMEN:  No Specimen  DISPOSITION OF SPECIMEN:  N/A  COUNTS:  YES  TOURNIQUET:  * No tourniquets in log *  DICTATION: .Dragon Dictation  PLAN OF CARE: Admit to inpatient   PATIENT DISPOSITION:  PACU - hemodynamically stable.

## 2017-12-11 NOTE — Transfer of Care (Signed)
Immediate Anesthesia Transfer of Care Note  Patient: Jeffrey Nelson  Procedure(s) Performed: LUMBAR THREE-FOUR,LUMBAR FOUR-FIVE TRANSFORAMINAL LUMBAR INTERBODY FUSION WITH PEDICLE SCREWS, RODS, CAGES, LOCAL AND ALLOGRAFT BONE GRAFT, VIVIGEN (N/A Spine Lumbar)  Patient Location: PACU  Anesthesia Type:General  Level of Consciousness: awake and patient cooperative  Airway & Oxygen Therapy: Patient Spontanous Breathing and Patient connected to face mask oxygen  Post-op Assessment: Report given to RN, Post -op Vital signs reviewed and stable and Patient moving all extremities X 4  Post vital signs: Reviewed and stable  Last Vitals:  Vitals Value Taken Time  BP 106/64 12/11/2017  6:40 PM  Temp    Pulse 107 12/11/2017  6:46 PM  Resp 18 12/11/2017  6:46 PM  SpO2 100 % 12/11/2017  6:46 PM  Vitals shown include unvalidated device data.  Last Pain:  Vitals:   12/11/17 0900  TempSrc: Oral  PainSc:       Patients Stated Pain Goal: 4 (12/10/17 1839)  Complications: No apparent anesthesia complications

## 2017-12-11 NOTE — Anesthesia Postprocedure Evaluation (Signed)
Anesthesia Post Note  Patient: Leafy HalfMelvin W Denz  Procedure(s) Performed: LUMBAR THREE-FOUR,LUMBAR FOUR-FIVE TRANSFORAMINAL LUMBAR INTERBODY FUSION WITH PEDICLE SCREWS, RODS, CAGES, LOCAL AND ALLOGRAFT BONE GRAFT, VIVIGEN (N/A Spine Lumbar)     Patient location during evaluation: PACU Anesthesia Type: General Level of consciousness: sedated Pain management: pain level controlled Vital Signs Assessment: post-procedure vital signs reviewed and stable Respiratory status: spontaneous breathing and respiratory function stable Cardiovascular status: stable Postop Assessment: no apparent nausea or vomiting Anesthetic complications: no    Last Vitals:  Vitals:   12/11/17 1909 12/11/17 1949  BP: (!) 103/56   Pulse: (!) 105   Resp: 12   Temp:  (!) 36.3 C  SpO2: 98%        LLE Sensation: Numbness(preop) (12/11/17 1949)   RLE Sensation: Numbness(preop) (12/11/17 1949)      Larkin Morelos DANIEL

## 2017-12-12 LAB — CBC
HEMATOCRIT: 29.9 % — AB (ref 39.0–52.0)
HEMOGLOBIN: 10.2 g/dL — AB (ref 13.0–17.0)
MCH: 32 pg (ref 26.0–34.0)
MCHC: 34.1 g/dL (ref 30.0–36.0)
MCV: 93.7 fL (ref 78.0–100.0)
Platelets: 189 10*3/uL (ref 150–400)
RBC: 3.19 MIL/uL — ABNORMAL LOW (ref 4.22–5.81)
RDW: 14.5 % (ref 11.5–15.5)
WBC: 12 10*3/uL — AB (ref 4.0–10.5)

## 2017-12-12 LAB — TYPE AND SCREEN
ABO/RH(D): O NEG
ANTIBODY SCREEN: NEGATIVE
Unit division: 0
Unit division: 0

## 2017-12-12 LAB — BPAM RBC
BLOOD PRODUCT EXPIRATION DATE: 201905102359
Blood Product Expiration Date: 201905132359
ISSUE DATE / TIME: 201904161532
ISSUE DATE / TIME: 201904161532
UNIT TYPE AND RH: 9500
Unit Type and Rh: 9500

## 2017-12-12 LAB — CBC WITH DIFFERENTIAL/PLATELET
BASOS ABS: 0 10*3/uL (ref 0.0–0.1)
Basophils Relative: 0 %
EOS ABS: 0 10*3/uL (ref 0.0–0.7)
Eosinophils Relative: 0 %
HCT: 26.7 % — ABNORMAL LOW (ref 39.0–52.0)
HEMOGLOBIN: 8.7 g/dL — AB (ref 13.0–17.0)
LYMPHS ABS: 2.1 10*3/uL (ref 0.7–4.0)
LYMPHS PCT: 12 %
MCH: 30.5 pg (ref 26.0–34.0)
MCHC: 32.6 g/dL (ref 30.0–36.0)
MCV: 93.7 fL (ref 78.0–100.0)
MONOS PCT: 18 %
Monocytes Absolute: 3.2 10*3/uL — ABNORMAL HIGH (ref 0.1–1.0)
Neutro Abs: 12.5 10*3/uL — ABNORMAL HIGH (ref 1.7–7.7)
Neutrophils Relative %: 70 %
PLATELETS: 189 10*3/uL (ref 150–400)
RBC: 2.85 MIL/uL — AB (ref 4.22–5.81)
RDW: 14.1 % (ref 11.5–15.5)
Smear Review: ADEQUATE
WBC: 17.8 10*3/uL — AB (ref 4.0–10.5)

## 2017-12-12 LAB — BASIC METABOLIC PANEL
ANION GAP: 8 (ref 5–15)
BUN: 20 mg/dL (ref 6–20)
CALCIUM: 8.8 mg/dL — AB (ref 8.9–10.3)
CHLORIDE: 103 mmol/L (ref 101–111)
CO2: 24 mmol/L (ref 22–32)
Creatinine, Ser: 0.85 mg/dL (ref 0.61–1.24)
GFR calc non Af Amer: >60 mL/min (ref >60)
Glucose, Bld: 166 mg/dL — ABNORMAL HIGH (ref 65–99)
Potassium: 4.7 mmol/L (ref 3.5–5.1)
SODIUM: 135 mmol/L (ref 135–145)

## 2017-12-12 LAB — GLUCOSE, CAPILLARY
GLUCOSE-CAPILLARY: 160 mg/dL — AB (ref 65–99)
GLUCOSE-CAPILLARY: 200 mg/dL — AB (ref 65–99)
GLUCOSE-CAPILLARY: 136 mg/dL — AB (ref 65–99)
Glucose-Capillary: 166 mg/dL — ABNORMAL HIGH (ref 65–99)
Glucose-Capillary: 161 mg/dL — ABNORMAL HIGH (ref 65–99)
Glucose-Capillary: 109 mg/dL — ABNORMAL HIGH (ref 65–99)

## 2017-12-12 LAB — TROPONIN I: TROPONIN I: 0.04 ng/mL — AB (ref <0.03)

## 2017-12-12 MED ORDER — ONDANSETRON HCL 4 MG/2ML IJ SOLN: 4.0000 mg | Freq: Four times a day (QID) | INTRAMUSCULAR | Status: DC | PRN

## 2017-12-12 MED ORDER — SODIUM CHLORIDE 0.9 % IV SOLN: 250.0000 mL | INTRAVENOUS | Status: DC

## 2017-12-12 MED ORDER — SODIUM CHLORIDE 0.9 % IV SOLN
INTRAVENOUS | Status: DC
  Administered 2017-12-12: 15:00:00 via INTRAVENOUS

## 2017-12-12 MED ORDER — ONDANSETRON HCL 4 MG PO TABS: 4.0000 mg | ORAL_TABLET | Freq: Four times a day (QID) | ORAL | Status: DC | PRN

## 2017-12-12 MED ORDER — CEFAZOLIN SODIUM-DEXTROSE 2-4 GM/100ML-% IV SOLN
2.0000 g | Freq: Three times a day (TID) | INTRAVENOUS | Status: AC
  Administered 2017-12-12 (×2): 2 g via INTRAVENOUS
  Filled 2017-12-12 (×2): qty 100

## 2017-12-12 MED ORDER — GABAPENTIN 300 MG PO CAPS
300.0000 mg | ORAL_CAPSULE | Freq: Two times a day (BID) | ORAL | Status: DC
  Administered 2017-12-13 – 2017-12-17 (×9): 300 mg via ORAL
  Filled 2017-12-12 (×9): qty 1

## 2017-12-12 MED ORDER — SODIUM CHLORIDE 0.9% FLUSH
3.0000 mL | Freq: Two times a day (BID) | INTRAVENOUS | Status: DC
  Administered 2017-12-12: 3 mL via INTRAVENOUS

## 2017-12-12 MED ORDER — MENTHOL 3 MG MT LOZG: 1.0000 | LOZENGE | OROMUCOSAL | Status: DC | PRN

## 2017-12-12 MED ORDER — SODIUM CHLORIDE 0.9% FLUSH: 3.0000 mL | INTRAVENOUS | Status: DC | PRN

## 2017-12-12 MED ORDER — ASPIRIN EC 81 MG PO TBEC
81.0000 mg | DELAYED_RELEASE_TABLET | Freq: Every day | ORAL | Status: DC
  Administered 2017-12-12: 81 mg via ORAL
  Filled 2017-12-12 (×2): qty 1

## 2017-12-12 MED ORDER — PHENOL 1.4 % MT LIQD: 1.0000 | OROMUCOSAL | Status: DC | PRN

## 2017-12-12 MED FILL — Thrombin For Soln 20000 Unit: CUTANEOUS | Qty: 1 | Status: AC

## 2017-12-12 NOTE — Progress Notes (Signed)
Orthopedic Tech Progress Note Patient Details:  Leafy HalfMelvin W Boline Aug 12, 1935 [id number] Brace completed by bio-tech. Patient ID: Leafy HalfMelvin W Picazo, male   DOB: Aug 12, 1935, 83 y.o.   MRN: [id number]   Jennye MoccasinHughes, Lylla Eifler Craig 12/12/2017, 5:00 PM

## 2017-12-12 NOTE — Progress Notes (Signed)
PT Cancellation Note  Patient Details Name: Jeffrey Nelson MRN: [id number] DOB: 1935-04-26   Cancelled Treatment:    Reason Eval/Treat Not Completed: Medical issues which prohibited therapy.  Pt on bedrest today, will see tomorrow as able. 12/12/2017  Jeffrey Nelson, PT [phone]  (pager)   Jeffrey Nelson 12/12/2017, 10:01 AM

## 2017-12-12 NOTE — Progress Notes (Signed)
CRITICAL VALUE ALERT  Critical Value:  Trop 0.04  Date & Time Notied:  12/12/17 22:01  Provider Notified: Dr Pixie CasinoJ. Kim  Orders Received/Actions taken: Labs ordered

## 2017-12-12 NOTE — Progress Notes (Signed)
     Subjective: 1 Day Post-Op Procedure(s) (LRB): LUMBAR THREE-FOUR,LUMBAR FOUR-FIVE TRANSFORAMINAL LUMBAR INTERBODY FUSION WITH PEDICLE SCREWS, RODS, CAGES, LOCAL AND ALLOGRAFT BONE GRAFT, VIVIGEN (N/A) Awake, alert and oriented x 4. No headache, no photophobia, no nuchal signs. Foley to SD will be discontinued today. Hemovac to be discontinued this afternoon.  Given units PRBCs during surgery and 400 cc of cell saver hyperhematocrit returned blood.  Some mild confusion as expected. Fitted for [employer] brace.  Patient reports pain as moderate.    Objective:   VITALS:  Temp:  [97.3 F (36.3 C)-98.6 F (37 C)] 98.1 F (36.7 C) (04/17 1235) Pulse Rate:  [92-115] 101 (04/17 1235) Resp:  [9-28] 20 (04/17 1235) BP: (80-126)/(52-94) 126/66 (04/17 1235) SpO2:  [97 %-100 %] 99 % (04/17 1235)  Neurologically intact ABD soft Neurovascular intact Intact pulses distally Dorsiflexion/Plantar flexion intact Incision: moderate drainage   LABS Recent Labs    12/12/17 0802  HGB 10.2*  WBC 12.0*  PLT 189   Recent Labs    12/12/17 0802  NA 135  K 4.7  CL 103  CO2 24  BUN 20  CREATININE 0.85  GLUCOSE 166*   No results for input(s): LABPT, INR in the last 72 hours.   Assessment/Plan: 1 Day Post-Op Procedure(s) (LRB): LUMBAR THREE-FOUR,LUMBAR FOUR-FIVE TRANSFORAMINAL LUMBAR INTERBODY FUSION WITH PEDICLE SCREWS, RODS, CAGES, LOCAL AND ALLOGRAFT BONE GRAFT, VIVIGEN (N/A)  Anemia due to blood loss. HGb 10  Advance diet Up with therapy  [name] 12/12/2017, 3:32 PMPatient ID: Jeffrey Nelson, male   DOB: 06-Sep-1934, 83 y.o.   MRN: [id number]

## 2017-12-12 NOTE — Progress Notes (Signed)
Patient c/o chest pain obtained EKG 12 per orders, Resp and cardiac Assessed, 2l oxygen [place] EKG read NSR, called NItka order for Troponin X1

## 2017-12-12 NOTE — Progress Notes (Signed)
Due to patient's blood pressure being low, nurse restarted fluids at 100 ml/hr to correct hypotensive matter.

## 2017-12-12 NOTE — Social Work (Signed)
CSW acknowledging consult for SNF. CSW will follow pt progress, await PT/OT recommendations for pt following d/c of bedrest.   Doy HutchingIsabel H Kinnedy Mongiello, LCSWA  Clinical Social Work [phone]

## 2017-12-12 NOTE — Progress Notes (Signed)
Orthopedic Tech Progress Note Patient Details:  Jeffrey Nelson 22-Dec-1934 [id number]  Patient ID: Jeffrey HalfMelvin W Apperson, male   DOB: 22-Dec-1934, 83 y.o.   MRN: [id number]   Nikki DomCrawford, Janziel Hockett 12/12/2017, 9:51 AM Called in bio-tech brace order; spoke with Wylene MenLacey

## 2017-12-13 DIAGNOSIS — D500 Iron deficiency anemia secondary to blood loss (chronic): Secondary | ICD-10-CM | POA: Diagnosis present

## 2017-12-13 LAB — GLUCOSE, CAPILLARY
GLUCOSE-CAPILLARY: 139 mg/dL — AB (ref 65–99)
Glucose-Capillary: 124 mg/dL — ABNORMAL HIGH (ref 65–99)
Glucose-Capillary: 156 mg/dL — ABNORMAL HIGH (ref 65–99)
Glucose-Capillary: 105 mg/dL — ABNORMAL HIGH (ref 65–99)

## 2017-12-13 LAB — CBC WITH DIFFERENTIAL/PLATELET
BASOS ABS: 0 10*3/uL (ref 0.0–0.1)
Basophils Relative: 0 %
EOS ABS: 0 10*3/uL (ref 0.0–0.7)
Eosinophils Relative: 0 %
HCT: 26.9 % — ABNORMAL LOW (ref 39.0–52.0)
HEMOGLOBIN: 8.8 g/dL — AB (ref 13.0–17.0)
LYMPHS PCT: 13 %
Lymphs Abs: 2.2 10*3/uL (ref 0.7–4.0)
MCH: 31 pg (ref 26.0–34.0)
MCHC: 32.7 g/dL (ref 30.0–36.0)
MCV: 94.7 fL (ref 78.0–100.0)
MONO ABS: 3.2 10*3/uL — AB (ref 0.1–1.0)
Monocytes Relative: 19 %
NEUTROS PCT: 68 %
Neutro Abs: 11.3 10*3/uL — ABNORMAL HIGH (ref 1.7–7.7)
Platelets: 206 10*3/uL (ref 150–400)
RBC: 2.84 MIL/uL — AB (ref 4.22–5.81)
RDW: 13.9 % (ref 11.5–15.5)
WBC: 16.7 10*3/uL — AB (ref 4.0–10.5)

## 2017-12-13 LAB — POCT I-STAT EG7
ACID-BASE EXCESS: 6 mmol/L — AB (ref 0.0–2.0)
BICARBONATE: 31.3 mmol/L — AB (ref 20.0–28.0)
CALCIUM ION: 1.16 mmol/L (ref 1.15–1.40)
HCT: 31 % — ABNORMAL LOW (ref 39.0–52.0)
Hemoglobin: 10.5 g/dL — ABNORMAL LOW (ref 13.0–17.0)
O2 SAT: 67 %
PCO2 VEN: 47.4 mmHg (ref 44.0–60.0)
PH VEN: 7.426 (ref 7.250–7.430)
PO2 VEN: 34 mmHg (ref 32.0–45.0)
Potassium: 5.8 mmol/L — ABNORMAL HIGH (ref 3.5–5.1)
Sodium: 138 mmol/L (ref 135–145)
TCO2: 33 mmol/L — AB (ref 22–32)

## 2017-12-13 LAB — POCT I-STAT 4, (NA,K, GLUC, HGB,HCT)
Glucose, Bld: 136 mg/dL — ABNORMAL HIGH (ref 65–99)
HEMATOCRIT: 34 % — AB (ref 39.0–52.0)
HEMOGLOBIN: 11.6 g/dL — AB (ref 13.0–17.0)
Potassium: 4.4 mmol/L (ref 3.5–5.1)
Sodium: 139 mmol/L (ref 135–145)

## 2017-12-13 LAB — TROPONIN I: TROPONIN I: 0.04 ng/mL — AB (ref <0.03)

## 2017-12-13 MED ORDER — FERROUS GLUCONATE 324 (38 FE) MG PO TABS
324.0000 mg | ORAL_TABLET | Freq: Three times a day (TID) | ORAL | Status: DC
  Administered 2017-12-14 – 2017-12-17 (×11): 324 mg via ORAL
  Filled 2017-12-13 (×12): qty 1

## 2017-12-13 MED ORDER — ZOLPIDEM TARTRATE 5 MG PO TABS
5.0000 mg | ORAL_TABLET | Freq: Every evening | ORAL | Status: DC | PRN
  Administered 2017-12-13 – 2017-12-14 (×2): 5 mg via ORAL
  Filled 2017-12-13 (×2): qty 1

## 2017-12-13 NOTE — Progress Notes (Signed)
Occupational Therapy Treatment Patient Details Name: Jeffrey Nelson MRN: [id number] DOB: 1935/05/05 Today's Date: 12/13/2017    History of present illness 83 yo admitted after syncope at orthopedics office. PMHx: severe back pain s/p microdiscectomy 3/18, CAD, DM 2, HTN, MI, CVA, HOH; now s\p TLIF L3-4, L4-5 on 12-11-16.   OT comments  Pt making progress with functional goals, OT will continue to follow acutely  Follow Up Recommendations  Supervision/Assistance - 24 hour    Equipment Recommendations  None recommended by OT    Recommendations for Other Services      Precautions / Restrictions Precautions Precautions: Back;Fall Precaution Booklet Issued: No Precaution Comments: pt remembered 1 out of 3 precautions, reviewed all back precautions with pt and wife Required Braces or Orthoses: Spinal Brace Spinal Brace: Lumbar corset;Applied in sitting position Restrictions Weight Bearing Restrictions: No Other Position/Activity Restrictions: watch HR       Mobility Bed Mobility Overal bed mobility: Needs Assistance Bed Mobility: Rolling;Sidelying to Sit Rolling: Supervision Sidelying to sit: Min assist       General bed mobility comments: pt up in recliner upon arrival  Transfers Overall transfer level: Needs assistance Equipment used: Rolling walker (2 wheeled) Transfers: Sit to/from Stand Sit to Stand: Min assist         General transfer comment: up from low bed with cues for hand placement    Balance Overall balance assessment: Needs assistance;History of Falls Sitting-balance support: Feet supported Sitting balance-Leahy Scale: Fair Sitting balance - Comments: sitting without support initially, when trying to don brace leaning posteriorly   Standing balance support: During functional activity;Single extremity supported;Bilateral upper extremity supported Standing balance-Leahy Scale: Fair Standing balance comment: pulling up underwear with minguard for  safety, but no LOB                            ADL either performed or assessed with clinical judgement   ADL Overall ADL's : Needs assistance/impaired     Grooming: Wash/dry face;Wash/dry hands;Standing;Min guard;With caregiver independent assisting           Upper Body Dressing : Moderate assistance;With caregiver independent assisting       Toilet Transfer: Ambulation;RW;Minimal assistance;With caregiver independent assisting;Comfort height toilet;Grab bars   Toileting- Clothing Manipulation and Hygiene: Sit to/from stand;Minimal assistance       Functional mobility during ADLs: Minimal assistance;Caregiver able to provide necessary level of assistance;Rolling walker       Vision Baseline Vision/History: Wears glasses Wears Glasses: At all times Patient Visual Report: No change from baseline     Perception     Praxis      Cognition Arousal/Alertness: Awake/alert Behavior During Therapy: WFL for tasks assessed/performed Overall Cognitive Status: Within Functional Limits for tasks assessed                                          Exercises     Shoulder Instructions       General Comments wife present throughout    Pertinent Vitals/ Pain       Pain Assessment: 0-10 Pain Score: 2  Pain Location: LE's in general Pain Descriptors / Indicators: Aching Pain Intervention(s): Monitored during session;Repositioned  Home Living Family/patient expects to be discharged to:: Private residence Living Arrangements: Spouse/significant other Available Help at Discharge: Family;Available 24 hours/day Type of Home: House Home Access: Stairs to  enter Entrance Stairs-Number of Steps: 5 Entrance Stairs-Rails: Right Home Layout: One level     Bathroom Shower/Tub: Producer, television/film/video: Handicapped height     Home Equipment: Environmental consultant - 2 wheels;Adaptive equipment Adaptive Equipment: Reacher;Sock aid        Prior  Functioning/Environment Level of Independence: Independent with assistive device(s)            Frequency  Min 2X/week        Progress Toward Goals  OT Goals(current goals can now be found in the care plan section)  Progress towards OT goals: Progressing toward goals  Acute Rehab OT Goals Patient Stated Goal: to go home  Plan Discharge plan remains appropriate    Co-evaluation                 AM-PAC PT "6 Clicks" Daily Activity     Outcome Measure   Help from another person eating meals?: None Help from another person taking care of personal grooming?: A Little Help from another person toileting, which includes using toliet, bedpan, or urinal?: A Lot Help from another person bathing (including washing, rinsing, drying)?: A Lot Help from another person to put on and taking off regular upper body clothing?: A Lot Help from another person to put on and taking off regular lower body clothing?: A Lot 6 Click Score: 15    End of Session Equipment Utilized During Treatment: Rolling walker;Other (comment);Back brace(3 in 1)  OT Visit Diagnosis: Unsteadiness on feet (R26.81);Other abnormalities of gait and mobility (R26.89);Muscle weakness (generalized) (M62.81);Pain Pain - Right/Left: Left Pain - part of body: Leg   Activity Tolerance Patient tolerated treatment well   Patient Left with call bell/phone within reach;in chair;with family/visitor present   Nurse Communication      Functional Assessment Tool Used: AM-PAC 6 Clicks Daily Activity   Time: 0981-1914 OT Time Calculation (min): 24 min  Charges: OT G-codes **NOT FOR INPATIENT CLASS** Functional Assessment Tool Used: AM-PAC 6 Clicks Daily Activity OT General Charges $OT Visit: 1 Visit OT Treatments $Self Care/Home Management : 8-22 mins $Therapeutic Activity: 8-22 mins     Galen Manila 12/13/2017, 2:14 PM

## 2017-12-13 NOTE — Plan of Care (Signed)
Re-established LTG's post surgery. Jeffrey Nelson, [place] [phone] 12/13/2017

## 2017-12-13 NOTE — Progress Notes (Signed)
Patient stated that he has not eat today. Did not like the hospital food. Patient is offered a choice in food from the nutrition room and offered peanut butter crackers and granola bar from food family brought in earlier. Patient stated that he is waiting for his wife to bring something. Patient is given fresh ice and drinks. Patient is given pain med, see eMAR, and is encouraged to eat the peanut butter crackers that family had left for him to prevent nausea. Crackers are placed in reach if patient changes his mind.      Assessed patient's dressing and found a small amount of dry drainage. Marked drainage with marker. Instructed patient to call out before going to sleep for a re assessment. Will continue to monitor.

## 2017-12-13 NOTE — Evaluation (Signed)
Physical Therapy Re-Evaluation Patient Details Name: Jeffrey Nelson W Ekblad MRN: [id number] DOB: 1935-02-15 Today's Date: 12/13/2017   History of Present Illness  83 yo admitted after syncope at orthopedics office. PMHx: severe back pain s/p microdiscectomy 3/18, CAD, DM 2, HTN, MI, CVA, HOH; now s\p TLIF L3-4, L4-5 on 12-11-16.  Clinical Impression  Patient presents sp surgery for back as above with much improved tolerance to activity.  Remains weak and mildly painful and will continue to benefit from skilled PT in the acute setting to allow d/c home with wife assist and follow up HHPT.     Follow Up Recommendations Home health PT;Supervision for mobility/OOB    Equipment Recommendations  None recommended by PT    Recommendations for Other Services       Precautions / Restrictions Precautions Precautions: Back;Fall Precaution Comments: pt remembered 2 out of 3 precautions Required Braces or Orthoses: Spinal Brace Spinal Brace: Lumbar corset;Applied in sitting position Restrictions Weight Bearing Restrictions: No Other Position/Activity Restrictions: watch HR      Mobility  Bed Mobility Overal bed mobility: Needs Assistance Bed Mobility: Rolling;Sidelying to Sit Rolling: Supervision Sidelying to sit: Min assist       General bed mobility comments: cues for technique, assist for coming upright  Transfers Overall transfer level: Needs assistance Equipment used: Rolling walker (2 wheeled) Transfers: Sit to/from Stand Sit to Stand: Min assist         General transfer comment: up from low bed with cues for hand placement  Ambulation/Gait Ambulation/Gait assistance: Min guard Ambulation Distance (Feet): 120 Feet Assistive device: Rolling walker (2 wheeled) Gait Pattern/deviations: Step-through pattern;Decreased stride length;Antalgic     General Gait Details: reports weakness on L LE  Stairs            Wheelchair Mobility    Modified Rankin (Stroke Patients  Only)       Balance Overall balance assessment: Needs assistance;History of Falls Sitting-balance support: Feet supported Sitting balance-Leahy Scale: Fair Sitting balance - Comments: sitting without support initially, when trying to don brace leaning posteriorly   Standing balance support: During functional activity Standing balance-Leahy Scale: Fair Standing balance comment: pulling up underwear with minguard for safety, but no LOB                              Pertinent Vitals/Pain Pain Score: 2  Pain Location: LE's in general Pain Descriptors / Indicators: Aching Pain Intervention(s): Monitored during session;Repositioned;Premedicated before session    Home Living Family/patient expects to be discharged to:: Private residence Living Arrangements: Spouse/significant other Available Help at Discharge: Family;Available 24 hours/day Type of Home: House Home Access: Stairs to enter Entrance Stairs-Rails: Right Entrance Stairs-Number of Steps: 5 Home Layout: One level Home Equipment: Walker - 2 wheels;Adaptive equipment      Prior Function Level of Independence: Independent with assistive device(s)               Hand Dominance   Dominant Hand: Right    Extremity/Trunk Assessment   Upper Extremity Assessment Upper Extremity Assessment: Overall WFL for tasks assessed    Lower Extremity Assessment Lower Extremity Assessment: RLE deficits/detail;LLE deficits/detail RLE Deficits / Details: AROM WFL, strength hip flexion 3+/5, knee extension 4+/5, ankle DF 5/5 LLE Deficits / Details: AROM WFL, strength hip flexion 3/5, knee extension 4/5, ankle DF 5/5    Cervical / Trunk Assessment Cervical / Trunk Assessment: Other exceptions Cervical / Trunk Exceptions: sp back surgery  Communication   Communication: HOH  Cognition Arousal/Alertness: Awake/alert Behavior During Therapy: WFL for tasks assessed/performed Overall Cognitive Status: Within Functional  Limits for tasks assessed                                        General Comments General comments (skin integrity, edema, etc.): wife present throughout    Exercises     Assessment/Plan    PT Assessment Patient needs continued PT services  PT Problem List Decreased strength;Decreased mobility;Decreased knowledge of precautions;Decreased activity tolerance;Decreased balance;Decreased knowledge of use of DME;Pain;Cardiopulmonary status limiting activity       PT Treatment Interventions DME instruction;Therapeutic activities;Gait training;Therapeutic exercise;Patient/family education;Stair training;Balance training;Functional mobility training    PT Goals (Current goals can be found in the Care Plan section)  Acute Rehab PT Goals Patient Stated Goal: to go home PT Goal Formulation: With patient/family Time For Goal Achievement: 12/20/17 Potential to Achieve Goals: Good    Frequency Min 5X/week   Barriers to discharge        Co-evaluation               AM-PAC PT "6 Clicks" Daily Activity  Outcome Measure Difficulty turning over in bed (including adjusting bedclothes, sheets and blankets)?: A Little Difficulty moving from lying on back to sitting on the side of the bed? : Unable Difficulty sitting down on and standing up from a chair with arms (e.g., wheelchair, bedside commode, etc,.)?: Unable Help needed moving to and from a bed to chair (including a wheelchair)?: A Little Help needed walking in hospital room?: A Little Help needed climbing 3-5 steps with a railing? : A Little 6 Click Score: 14    End of Session Equipment Utilized During Treatment: Back brace Activity Tolerance: Patient tolerated treatment well Patient left: in chair;with call bell/phone within reach;with family/visitor present   PT Visit Diagnosis: Other abnormalities of gait and mobility (R26.89);Pain Pain - Right/Left: Left(both) Pain - part of body: Leg    Time:  1610-9604 PT Time Calculation (min) (ACUTE ONLY): 26 min   Charges:   PT Evaluation $PT Re-evaluation: 1 Re-eval PT Treatments $Gait Training: 8-22 mins   PT G CodesSheran Lawless, [place] [phone] 12/13/2017   [name] 12/13/2017, 10:36 AM

## 2017-12-13 NOTE — Progress Notes (Signed)
     Subjective: 2 Days Post-Op Procedure(s) (LRB): LUMBAR THREE-FOUR,LUMBAR FOUR-FIVE TRANSFORAMINAL LUMBAR INTERBODY FUSION WITH PEDICLE SCREWS, RODS, CAGES, LOCAL AND ALLOGRAFT BONE GRAFT, VIVIGEN (N/A) Afebrile VSS, Low BP yesterday, PT put off until more stable and brace has been fitted. Voiding without difficulty. Standing and walking today with a walker, some left quad weakness, likely do to manipulation of the left L3 and L4 nerve roots Four time surgery at these levels and for placement of cages, retraction of these nerves. No pain, some minimal  Numbness. Gabapentine dose decreased to decrease sedation.   Patient reports pain as moderate.    Objective:   VITALS:  Temp:  [97.8 F (36.6 C)-98.8 F (37.1 C)] 98.3 F (36.8 C) (04/18 1244) Pulse Rate:  [88-119] 99 (04/18 1200) Resp:  [13-25] 19 (04/18 1200) BP: (96-140)/(50-86) 137/62 (04/18 1200) SpO2:  [94 %-100 %] 94 % (04/18 1200) Weight:  [210 lb 8.6 oz (95.5 kg)] 210 lb 8.6 oz (95.5 kg) (04/18 0700)  ABD soft Neurovascular intact Intact pulses distally Dorsiflexion/Plantar flexion intact Incision: moderate drainage and Dressing changed at noon today due to sangineous drainage, waffle and opsite dressing minimal drainage at this time.    LABS Recent Labs    12/11/17 1626 12/11/17 1734 12/12/17 0802 12/12/17 1705  HGB 10.5* 11.6* 10.2* 8.7*  WBC  --   --  12.0* 17.8*  PLT  --   --  189 189   Recent Labs    12/11/17 1734 12/12/17 0802  NA 139 135  K 4.4 4.7  CL  --  103  CO2  --  24  BUN  --  20  CREATININE  --  0.85  GLUCOSE 136* 166*   No results for input(s): LABPT, INR in the last 72 hours.   Assessment/Plan: 2 Days Post-Op Procedure(s) (LRB): LUMBAR THREE-FOUR,LUMBAR FOUR-FIVE TRANSFORAMINAL LUMBAR INTERBODY FUSION WITH PEDICLE SCREWS, RODS, CAGES, LOCAL AND ALLOGRAFT BONE GRAFT, VIVIGEN (N/A)  Advance diet Up with therapy  No Hgb from today, I corrected order to have done this AM but it  does not appear as though blood was drawn this AM.  It is early on in rehab phase, first steps were today and he did get out into the hallway.  Will check in AM 4/19 and if he is stable discuss possible  Discharge. HHN would need to be set up today [place] resources [place] not be as available as Normal.   Jeffrey Nelson 12/13/2017, 1:48 PMPatient ID: Jeffrey Nelson, male   DOB: 12/16/1934, 83 y.o.   MRN: [id number]

## 2017-12-14 ENCOUNTER — Encounter (HOSPITAL_COMMUNITY): Payer: Self-pay | Admitting: Internal Medicine

## 2017-12-14 ENCOUNTER — Inpatient Hospital Stay (HOSPITAL_COMMUNITY): Payer: Medicare HMO

## 2017-12-14 DIAGNOSIS — Z419 Encounter for procedure for purposes other than remedying health state, unspecified: Secondary | ICD-10-CM

## 2017-12-14 DIAGNOSIS — D72829 Elevated white blood cell count, unspecified: Secondary | ICD-10-CM | POA: Diagnosis not present

## 2017-12-14 DIAGNOSIS — R000 Tachycardia, unspecified: Secondary | ICD-10-CM | POA: Diagnosis not present

## 2017-12-14 LAB — GLUCOSE, CAPILLARY
GLUCOSE-CAPILLARY: 177 mg/dL — AB (ref 65–99)
GLUCOSE-CAPILLARY: 189 mg/dL — AB (ref 65–99)
GLUCOSE-CAPILLARY: 199 mg/dL — AB (ref 65–99)
Glucose-Capillary: 175 mg/dL — ABNORMAL HIGH (ref 65–99)
Glucose-Capillary: 148 mg/dL — ABNORMAL HIGH (ref 65–99)

## 2017-12-14 LAB — BASIC METABOLIC PANEL
Anion gap: 8 (ref 5–15)
BUN: 14 mg/dL (ref 6–20)
CHLORIDE: 96 mmol/L — AB (ref 101–111)
CO2: 29 mmol/L (ref 22–32)
CREATININE: 0.81 mg/dL (ref 0.61–1.24)
Calcium: 8.7 mg/dL — ABNORMAL LOW (ref 8.9–10.3)
GFR calc Af Amer: >60 mL/min (ref >60)
GFR calc non Af Amer: >60 mL/min (ref >60)
GLUCOSE: 172 mg/dL — AB (ref 65–99)
Potassium: 4.1 mmol/L (ref 3.5–5.1)
SODIUM: 133 mmol/L — AB (ref 135–145)

## 2017-12-14 LAB — URINALYSIS, ROUTINE W REFLEX MICROSCOPIC
Bacteria, UA: NONE SEEN
Bilirubin Urine: NEGATIVE
Glucose, UA: NEGATIVE mg/dL
HGB URINE DIPSTICK: NEGATIVE
Ketones, ur: NEGATIVE mg/dL
Nitrite: NEGATIVE
PROTEIN: NEGATIVE mg/dL
RBC / HPF: NONE SEEN RBC/hpf (ref 0–5)
SPECIFIC GRAVITY, URINE: 1.012 (ref 1.005–1.030)
pH: 7 (ref 5.0–8.0)

## 2017-12-14 LAB — CBC WITH DIFFERENTIAL/PLATELET
BASOS PCT: 0 %
Basophils Absolute: 0 10*3/uL (ref 0.0–0.1)
EOS ABS: 0 10*3/uL (ref 0.0–0.7)
EOS PCT: 0 %
HCT: 25.2 % — ABNORMAL LOW (ref 39.0–52.0)
HEMOGLOBIN: 8.3 g/dL — AB (ref 13.0–17.0)
Lymphocytes Relative: 16 %
Lymphs Abs: 2.5 10*3/uL (ref 0.7–4.0)
MCH: 31.1 pg (ref 26.0–34.0)
MCHC: 32.9 g/dL (ref 30.0–36.0)
MCV: 94.4 fL (ref 78.0–100.0)
MONO ABS: 1.1 10*3/uL — AB (ref 0.1–1.0)
Monocytes Relative: 7 %
NEUTROS ABS: 11.9 10*3/uL — AB (ref 1.7–7.7)
Neutrophils Relative %: 77 %
PLATELETS: 213 10*3/uL (ref 150–400)
RBC: 2.67 MIL/uL — ABNORMAL LOW (ref 4.22–5.81)
RDW: 13.8 % (ref 11.5–15.5)
WBC: 15.5 10*3/uL — ABNORMAL HIGH (ref 4.0–10.5)

## 2017-12-14 MED ORDER — ENSURE ENLIVE PO LIQD
237.0000 mL | Freq: Three times a day (TID) | ORAL | Status: DC
  Administered 2017-12-14 – 2017-12-17 (×6): 237 mL via ORAL

## 2017-12-14 MED ORDER — PRO-STAT SUGAR FREE PO LIQD
30.0000 mL | Freq: Every day | ORAL | Status: DC
  Administered 2017-12-14 – 2017-12-17 (×4): 30 mL via ORAL
  Filled 2017-12-14 (×4): qty 30

## 2017-12-14 MED ORDER — SODIUM CHLORIDE 0.9 % IV SOLN
INTRAVENOUS | Status: AC
  Administered 2017-12-14: 21:00:00 via INTRAVENOUS

## 2017-12-14 MED ORDER — SODIUM CHLORIDE 0.9 % IV BOLUS
500.0000 mL | Freq: Once | INTRAVENOUS | Status: AC
  Administered 2017-12-14: 500 mL via INTRAVENOUS

## 2017-12-14 MED ORDER — ENSURE ENLIVE PO LIQD
237.0000 mL | Freq: Two times a day (BID) | ORAL | Status: DC
  Administered 2017-12-14 – 2017-12-15 (×2): 237 mL via ORAL

## 2017-12-14 MED ORDER — METOPROLOL TARTRATE 12.5 MG HALF TABLET
12.5000 mg | ORAL_TABLET | Freq: Two times a day (BID) | ORAL | Status: DC
  Administered 2017-12-14 – 2017-12-15 (×3): 12.5 mg via ORAL
  Filled 2017-12-14 (×3): qty 1

## 2017-12-14 MED FILL — Heparin Sodium (Porcine) Inj 1000 Unit/ML: INTRAMUSCULAR | Qty: 60 | Status: AC

## 2017-12-14 MED FILL — Sodium Chloride IV Soln 0.9%: INTRAVENOUS | Qty: 2000 | Status: AC

## 2017-12-14 MED FILL — Sodium Chloride Irrigation Soln 0.9%: Qty: 3000 | Status: AC

## 2017-12-14 NOTE — Progress Notes (Signed)
Nutrition Follow-up  DOCUMENTATION CODES:   Not applicable  INTERVENTION:  Continue Ensure Enlive po BID, each supplement provides 350 kcal and 20 grams of protein.  Provide 30 ml Prostat po once daily, each supplement provides 100 kcal and 15 grams of protein.   Encourage adequate PO intake.   NUTRITION DIAGNOSIS:   Increased nutrient needs related to acute illness as evidenced by estimated needs; ongoing  GOAL:   Patient will meet greater than or equal to 90% of their needs; progressing  MONITOR:   PO intake, Supplement acceptance, Labs, Weight trends, I & O's, Skin  REASON FOR ASSESSMENT:   Malnutrition Screening Tool    ASSESSMENT:   83 year old male with history of coronary artery disease with prior MIs status post CABG, prior CVA, diabetes mellitus type 2, hypertension presents with syncope and hypotension.   Procedure(4/16): LUMBAR THREE-FOUR,LUMBAR FOUR-FIVE TRANSFORAMINAL LUMBAR INTERBODY FUSION WITH PEDICLE SCREWS, RODS, CAGES, LOCAL AND ALLOGRAFT BONE GRAFT, VIVIGEN (N/A Spine Lumbar)  Pt was unavailable during time of visit. Meal completion has been 0-100%. Per RN, pt dislikes the hospital food and the wife has been bringing in food from outside/home. Pt currently has Ensure ordered and has been consuming them. RD to additionally order Prostat to aid in adequate protein needs. Labs and medications reviewed.   Diet Order:  Diet Carb Modified Fluid consistency: Thin; Room service appropriate? Yes  EDUCATION NEEDS:   Not appropriate for education at this time  Skin:  Skin Assessment: Skin Integrity Issues: Skin Integrity Issues:: Incisions Incisions: back  Last BM:  4/18  Height:   Ht Readings from Last 1 Encounters:  12/06/17 6\' 2"  (1.88 m)    Weight:   Wt Readings from Last 1 Encounters:  12/13/17 210 lb 8.6 oz (95.5 kg)    Ideal Body Weight:  86.36 kg  BMI:  Body mass index is 27.03 kg/m.  Estimated Nutritional Needs:   Kcal:   2050-2300  Protein:  90-100 grams  Fluid:  >/= 2 L/day    Roslyn SmilingStephanie Brando Taves, MS, RD, LDN Pager # [phone] After hours/ weekend pager # [phone]

## 2017-12-14 NOTE — Progress Notes (Signed)
Physical Therapy Treatment Patient Details Name: Jeffrey Nelson Beil MRN: [id number] DOB: [birth year] Today's Date: 12/14/2017    History of Present Illness 83 yo admitted after syncope at orthopedics office. PMHx: severe back pain s/p microdiscectomy 3/18, CAD, DM 2, HTN, MI, CVA, HOH; now s\p TLIF L3-4, L4-5 on 12-11-16.    PT Comments    Patient progressing ambulation this session and reports feels his legs are stronger, though stated did not walk again with nursing.  Re-educated needs to call for assistance to walk at least 2 more times today and wrote on white board.  Will practice stairs prior to d/c home.    Follow Up Recommendations  Home health PT;Supervision for mobility/OOB     Equipment Recommendations  None recommended by PT    Recommendations for Other Services       Precautions / Restrictions Precautions Precautions: Back;Fall Precaution Comments: recalled 2/3 precautions; issued handout  Required Braces or Orthoses: Spinal Brace Spinal Brace: Lumbar corset;Applied in sitting position Restrictions Weight Bearing Restrictions: No    Mobility  Bed Mobility               General bed mobility comments: pt up in recliner upon arrival  Transfers   Equipment used: Rolling walker (2 wheeled) Transfers: Sit to/from Stand Sit to Stand: Min guard         General transfer comment: to steady  Ambulation/Gait   Ambulation Distance (Feet): 220 Feet Assistive device: Rolling walker (2 wheeled) Gait Pattern/deviations: Step-through pattern;Decreased stride length     General Gait Details: slow, but steady with RW, once clipped obstacle in hallway with walker wheel. otherwise maneuvering well around obstacles   Stairs             Wheelchair Mobility    Modified Rankin (Stroke Patients Only)       Balance Overall balance assessment: Needs assistance;History of Falls   Sitting balance-Leahy Scale: Good       Standing balance-Leahy Scale:  Fair                              Cognition Arousal/Alertness: Awake/alert Behavior During Therapy: WFL for tasks assessed/performed Overall Cognitive Status: Within Functional Limits for tasks assessed                                        Exercises General Exercises - Lower Extremity Long Arc Quad: AROM;10 reps;Both;Seated Hip Flexion/Marching: AROM;10 reps;Both;Standing Heel Raises: AROM;10 reps;Both;Standing Mini-Sqauts: AROM;10 reps;Standing;Both    General Comments General comments (skin integrity, edema, etc.): c/o neck pain and obtained heat packs placed on neck for comfort      Pertinent Vitals/Pain Pain Score: 5  Pain Location: LE's in general Pain Descriptors / Indicators: Aching Pain Intervention(s): Repositioned;Monitored during session    Home Living                      Prior Function            PT Goals (current goals can now be found in the care plan section) Progress towards PT goals: Progressing toward goals    Frequency    Min 5X/week      PT Plan Current plan remains appropriate    Co-evaluation              AM-PAC PT "6 Clicks" Daily  Activity  Outcome Measure  Difficulty turning over in bed (including adjusting bedclothes, sheets and blankets)?: A Little Difficulty moving from lying on back to sitting on the side of the bed? : Unable Difficulty sitting down on and standing up from a chair with arms (e.g., wheelchair, bedside commode, etc,.)?: Unable Help needed moving to and from a bed to chair (including a wheelchair)?: A Little Help needed walking in hospital room?: A Little Help needed climbing 3-5 steps with a railing? : A Little 6 Click Score: 14    End of Session Equipment Utilized During Treatment: Back brace;Gait belt Activity Tolerance: Patient tolerated treatment well Patient left: with call bell/phone within reach;in chair;with family/visitor present   PT Visit Diagnosis: Other  abnormalities of gait and mobility (R26.89);Pain Pain - Right/Left: Left(both) Pain - part of body: Leg     Time: 1610-9604 PT Time Calculation (min) (ACUTE ONLY): 23 min  Charges:  $Gait Training: 8-22 mins $Therapeutic Exercise: 8-22 mins                    G CodesSheran Lawless, [place] [phone] 12/14/2017    [name] 12/14/2017, 10:45 AM

## 2017-12-14 NOTE — Progress Notes (Signed)
When patient is ambulated to the BR this am noted pt dressing slight bulge. The charge nurse also looked at the dressing and saw slight bulge. Patient denies any pain.  Will report to oncoming nurse, Juanda Chanceracy RN.

## 2017-12-14 NOTE — Progress Notes (Addendum)
     Subjective: 3 Days Post-Op Procedure(s) (LRB): LUMBAR THREE-FOUR,LUMBAR FOUR-FIVE TRANSFORAMINAL LUMBAR INTERBODY FUSION WITH PEDICLE SCREWS, RODS, CAGES, LOCAL AND ALLOGRAFT BONE GRAFT, VIVIGEN (N/A)Awake, alert and oriented x3. Leg pain is better some weakness left quad continues, able to walk about the nurses station. I feel tired.   Patient reports pain as moderate.    Objective:   VITALS:  Temp:  [97.5 F (36.4 C)-99.8 F (37.7 C)] 98.3 F (36.8 C) (04/19 0748) Pulse Rate:  [99-108] 108 (04/19 0800) Resp:  [13-20] 18 (04/19 0800) BP: (112-139)/(61-78) 139/78 (04/19 0800) SpO2:  [94 %-98 %] 95 % (04/19 0800)  ABD soft Neurovascular intact Sensation intact distally Intact pulses distally Dorsiflexion/Plantar flexion intact Incision: scant drainage New dressing with telfa and ABD applied no active bleeding, spot of blood about 1.5 inches around on bandage.    LABS Recent Labs    12/11/17 1626 12/11/17 1734  12/12/17 0802 12/12/17 1705 12/13/17 1824  HGB 10.5* 11.6*  --  10.2* 8.7* 8.8*  WBC  --   --    < > 12.0* 17.8* 16.7*  PLT  --   --    < > 189 189 206   < > = values in this interval not displayed.   Recent Labs    12/11/17 1734 12/12/17 0802  NA 139 135  K 4.4 4.7  CL  --  103  CO2  --  24  BUN  --  20  CREATININE  --  0.85  GLUCOSE 136* 166*  EKG is sinus tachycardia, inferior infarct age indeterminant,   No results for input(s): LABPT, INR in the last 72 hours. Urine is dark orange wife notes, smaller quantity.  Assessment/Plan: 3 Days Post-Op Procedure(s) (LRB): LUMBAR THREE-FOUR,LUMBAR FOUR-FIVE TRANSFORAMINAL LUMBAR INTERBODY FUSION WITH PEDICLE SCREWS, RODS, CAGES, LOCAL AND ALLOGRAFT BONE GRAFT, VIVIGEN (N/A)  Anemia due to blood loss, last evening Hbg 8.8, Hct 27% on Ferrous gluconate TID with meals.  Increased heart rate, irregular baseline, obtain EKG. Check lab work.    Advance diet Up with therapy  Heart rate is elevated and I  will ask Hospitalist to look in On patient to help with health maintenance. Lab ordered CBC, BMET and EKG.   Jeffrey Nelson 12/14/2017, 11:10 AMPatient ID: Jeffrey Nelson, male   DOB: 1934/09/04, 83 y.o.   MRN: [id number]

## 2017-12-14 NOTE — Consult Note (Signed)
[facility] Medical Consultation  RANDI COLLEGE VWU:[id number] DOB: 12-20-1934 DOA: 12/03/2017 PCP: Deloris Ping, MD   Requesting physician: Otelia Sergeant Date of consultation: December 14 2017 Reason for consultation: tachycardia  Impression/Recommendations Principal Problem:   Tachycardia Active Problems:   Spinal stenosis, lumbar region, with neurogenic claudication   Diabetes mellitus type 2, uncomplicated (HCC)   Status post lumbar laminectomy   Syncope   Dehydration   Chronic back pain   Hypotension   Subclavian artery stenosis, right (HCC)   Anemia due to blood loss   Leukocytosis  #1. Tachycardia.  suspect multifactorial etiology specifically mild dehydration and his home metoprolol has not been resumed. He does have a leukocytosis buddies afebrile and hemodynamically stable. He is not hypoxic. No chest pain.chest x-ray without active disease. EKG without acute changes. -provide him with a 500 mL bolus of normal saline -We will initiate continuous IV fluids at 75 an hour for the next 24 hours -We will resume his home metoprolol at a lower dose with parameters -monitor closely  #2. Leukocytosis. Trending up. Patient is afebrile nontoxic appearing. Chest x-ray without infiltrate. Of note chart review indicates and has received steroid injections and oral steroids in the past. Recent cortisol was checked and found to be decreased to 2 and cosyntropin stimulation test done with improvement of cortisone level.  -Mobilize -Obtain urinalysis -intake and output -Incentive spirometry -Monitor closely  #3. Diabetes. Diet controlled. Patient reports he was taken off of his oral agents recently.capillary blood sugars in the 170s. Likely related to recent steroids -Sliding scale insulin -obtain a hemoglobin A1c -Carb modified diet  #4. Anemia.with a history of anemia. Currently hemoglobin 8.8. Likely related to recent surgery. No signs symptoms of active bleeding.home  medications to include Plavix. -holding Plavix for now -Monitor hemoglobin   #5. Hypertension. Fair control. Home medications include metoprolol,amlodipine, lisinopril -holding amlodipine and lisinopril for now -Resuming metoprolol at lower dose as noted above -Monitor closely -Resume home meds as indicated  [facility] will followup again tomorrow. Please contact [facility] if  can be of assistance in the meanwhile. Thank you for this consultation.  Chief Complaint: tachycardia  HPI:  Jeffrey Nelson is a very pleasant 83 year old with a past medical history that includes CAD status post CABG with subsequent MIs and multiple angioplasties due to lesions not amenable to stenting, hypertension, hyperlipidemia, type 2 diabetes diet controlled, CVA, chronic back pain nd underwent lumbar fusion at 3445 with pedicle screws rods 3 days ago.    Information is obtained from the chart and the patient. He states he went to his orthopedic surgeon's office 3 days ago for follow-up to excruciating back pain. While in the office the patient felt lightheaded and dizzy and reportedly he passed out. At that time he was found to have significant hypotension and was sent to the emergency department. In the emergency department he was found to be orthostatic was given IV fluids and admitted to the hospital. He was evaluated for his syncopal episode presumably from dehydration. Evaluated by ortho who recommended surgery. Patient reports good pain control since his surgery. He also reports decreased oral intake related to pain and quality of food. He states he's been getting up to the chair at least once a day and this morning he ambulated down the hall. He denies headache dizziness syncope or near-syncope. He denies a chest pain palpitations shortness of breath. He denies abdominal pain nausea vomiting diarrhea constipation melena bright red blood per rectum. He denies any dysuria hematuria  frequency or urgency but does report that his  urine was "quite concentrated" this morning.  Review of Systems:  10 point review of systems complete and all systems are negative except as indicated in the history of present illness  Past Medical History:  Diagnosis Date  . Anginal pain (HCC)    occ  . Arthritis    "joints" (10/09/2017)  . Chronic lower back pain   . Complication of anesthesia    extremely claustrophobic; "have to be put down for MRI; don't sit in back seat of car;, etc."  . Coronary artery disease   . High cholesterol    "get shots twice/month" (10/09/2017)  . History of blood transfusion 2001   "related to CABG"  . History of kidney stones   . HOH (hard of hearing)    wearing hearing aids  . Hypertension   . Myocardial infarction (HCC) 2000s-06/2017 X 4  . Stroke [facility]) ~ 2015   "in my right eye; sight is coming back little by little" (10/09/2017)  . Type II diabetes mellitus (HCC)    Past Surgical History:  Procedure Laterality Date  . ANTERIOR CERVICAL DECOMP/DISCECTOMY FUSION N/A 05/14/2015   Procedure: C3-4  ANTERIOR CERVICAL DISCECTOMY AND FUSION WITH PLATE AND SCREWS, LOCAL AND ALLOGRAFT BONE GRAFT;  Surgeon: Kerrin Champagne, MD;  Location: [facility] OR;  Service: Orthopedics;  Laterality: N/A;  . BACK SURGERY    . CARDIAC CATHETERIZATION     X 17 (10/09/2017)  . CATARACT EXTRACTION W/ INTRAOCULAR LENS  IMPLANT, BILATERAL Bilateral   . COLONOSCOPY    . CORONARY ANGIOPLASTY  06/2017  . CORONARY ANGIOPLASTY WITH STENT PLACEMENT     "6 stents total" (10/09/2017)  . CORONARY ARTERY BYPASS GRAFT  2001   CABG X4  . CYSTOSCOPY W/ STONE MANIPULATION    . JOINT REPLACEMENT    . LUMBAR LAMINECTOMY Right 10/13/2017   Procedure: MICRODISCECTOMY LUMBAR LAMINECTOMY RIGHT L3-4;  Surgeon: Kerrin Champagne, MD;  Location: [facility] OR;  Service: Orthopedics;  Laterality: Right;  . LUMBAR LAMINECTOMY Left 11/12/2017   Procedure: MICRODISCECTOMY L3-4 FOR RECURRENT HNP POSSIBLE FLURO;  Surgeon: Kerrin Champagne, MD;  Location: [facility] OR;   Service: Orthopedics;  Laterality: Left;  . LUMBAR LAMINECTOMY/DECOMPRESSION MICRODISCECTOMY N/A 01/16/2014   Procedure: Left L3-4 and L4-5 foraminotomies;  Surgeon: Kerrin Champagne, MD;  Location: [facility] OR;  Service: Orthopedics;  Laterality: N/A;  . NASAL FRACTURE SURGERY    . NASAL SINUS SURGERY     "several times since 1959"  . RADIOLOGY WITH ANESTHESIA N/A 10/12/2017   Procedure: MRI WITH ANESTHESIA;  Surgeon: Radiologist, Medication, MD;  Location: [facility] OR;  Service: Radiology;  Laterality: N/A;  . RADIOLOGY WITH ANESTHESIA N/A 12/05/2017   Procedure: MRI WITH ANESTHESIA;  Surgeon: Radiologist, Medication, MD;  Location: [facility] OR;  Service: Radiology;  Laterality: N/A;  . SHOULDER OPEN ROTATOR CUFF REPAIR Bilateral   . TOTAL KNEE ARTHROPLASTY Right 2012  . TYMPANOSTOMY TUBE PLACEMENT Bilateral   . ULNAR NERVE TRANSPOSITION Left 05/14/2015   Procedure: LEFT ULNAR NERVE DECOMPRESSION AT THE ELBOW;  Surgeon: Kerrin Champagne, MD;  Location: [facility] OR;  Service: Orthopedics;  Laterality: Left;   Social History:  reports that he quit smoking about 60 years ago. He has a 2.00 pack-year smoking history. He has never used smokeless tobacco. He reports that he does not drink alcohol or use drugs.  Allergies  Allergen Reactions  . Ezetimibe Other (See Comments)    Cramps  . Morphine And  Related Other (See Comments)    Confusion  . Statins Other (See Comments)    Cramps   Family History  Problem Relation Age of Onset  . CAD Brother   . Diabetes Neg Hx     Prior to Admission medications   Medication Sig Start Date End Date Taking? Authorizing Provider  amLODipine (NORVASC) 10 MG tablet Take 10 mg by mouth daily.  10/14/17  Yes [provider]  clopidogrel (PLAVIX) 75 MG tablet Take 75 mg by mouth daily.   Yes [provider]  docusate sodium (COLACE) 100 MG capsule Take 200 mg by mouth 2 (two) times daily as needed for mild constipation.   Yes [provider]  gabapentin  (NEURONTIN) 300 MG capsule Take 1 capsule (300 mg total) by mouth at bedtime for 7 days, THEN 1 capsule (300 mg total) 2 (two) times daily for 28 days. Patient taking differently: 1 capsule (300mg ) twice daily 10/31/17 12/05/17 Yes Kerrin ChampagneNitka, James E, MD  lisinopril (PRINIVIL,ZESTRIL) 5 MG tablet Take 5 mg by mouth daily.   Yes [provider]  meloxicam (MOBIC) 7.5 MG tablet Take 7.5 mg by mouth daily. 11/28/17  Yes [provider]  metoprolol tartrate (LOPRESSOR) 50 MG tablet Take 50 mg by mouth 2 (two) times daily. 09/20/17  Yes [provider]  Multiple Vitamin (MULTIVITAMIN WITH MINERALS) TABS tablet Take 1 tablet by mouth daily.   Yes [provider]  oxyCODONE (OXYCONTIN) 20 mg 12 hr tablet Take 1 tablet (20 mg total) by mouth every 12 (twelve) hours. 11/22/17  Yes Kerrin ChampagneNitka, James E, MD  oxyCODONE-acetaminophen (PERCOCET/ROXICET) 5-325 MG tablet Take 1 tablet by mouth every 6 (six) hours as needed for severe pain. 11/13/17  Yes Naida Sleightwens, James M, PA-C  tamsulosin (FLOMAX) 0.4 MG CAPS capsule Take 0.4 mg by mouth daily after supper.   Yes [provider]  aspirin EC 81 MG tablet Take 81 mg by mouth daily.    [provider]  Evolocumab (REPATHA) 140 MG/ML SOSY Inject 140 mg into the skin every 14 (fourteen) days.    [provider]   Physical Exam: Blood pressure 139/78, pulse (!) 108, temperature 98.3 F (36.8 C), temperature source Oral, resp. rate 18, height 6\' 2"  (1.88 m), weight 95.5 kg (210 lb 8.6 oz), SpO2 95 %. Vitals:   12/14/17 0748 12/14/17 0800  BP:  139/78  Pulse:  (!) 108  Resp:  18  Temp: 98.3 F (36.8 C)   SpO2:  95%     General:  Awake alert well-nourished lying in bed in no acute distress  Eyes: upils equal round reactive to light EOMI no scleral icterus  ENT: ears clear nose without drainage oropharynx without erythema or exudate  Neck: supple no JVD full range of motionno lymphadenopathy  Cardiovascular:  tachycardic but regular I hear no murmur gallop or rub no lower extremity edema  Respiratory: normal effort breath sounds clear to auscultation bilaterally I hear no wheeze no rhonchi  Abdomen: soft positive bowel sounds throughout no guarding or rebounding  Skin: lightly pale but warm and dry incision dressing to lumbar spine clean and dry  Musculoskeletal: moves all extremities spontaneously joints without swelling/erythema  Psychiatric: calm, cooperative  Neurologic: alert and oriented 3 speech clear facial symmetry bilateral grip 5 out of 5 lower extremity strength 5 out of 5  Labs on Admission:  Basic Metabolic Panel: Recent Labs  Lab 12/08/17 0747 12/11/17 1626 12/11/17 1734 12/12/17 0802  NA 136 138 139  135  K 3.8 5.8* 4.4 4.7  CL 100*  --   --  103  CO2 29  --   --  24  GLUCOSE 118*  --  136* 166*  BUN 9  --   --  20  CREATININE 0.73  --   --  0.85  CALCIUM 9.0  --   --  8.8*   Liver Function Tests: No results for input(s): AST, ALT, ALKPHOS, BILITOT, PROT, ALBUMIN in the last 168 hours. No results for input(s): LIPASE, AMYLASE in the last 168 hours. No results for input(s): AMMONIA in the last 168 hours. CBC: Recent Labs  Lab 12/08/17 0747 12/11/17 1626 12/11/17 1734 12/12/17 0802 12/12/17 1705 12/13/17 1824  WBC 10.5  --   --  12.0* 17.8* 16.7*  NEUTROABS 6.4  --   --   --  12.5* 11.3*  HGB 10.9* 10.5* 11.6* 10.2* 8.7* 8.8*  HCT 33.1* 31.0* 34.0* 29.9* 26.7* 26.9*  MCV 96.2  --   --  93.7 93.7 94.7  PLT 291  --   --  189 189 206   Cardiac Enzymes: Recent Labs  Lab 12/12/17 1915 12/13/17 0357  TROPONINI 0.04* 0.04*   BNP: Invalid input(s): POCBNP CBG: Recent Labs  Lab 12/13/17 0723 12/13/17 1243 12/13/17 1638 12/13/17 2025 12/14/17 0743  GLUCAP 124* 156* 105* 139* 175*    Radiological Exams on Admission: Dg Chest Port 1v Same Day  Result Date: 12/14/2017 CLINICAL DATA:  Fever. EXAM: PORTABLE CHEST 1 VIEW COMPARISON:  CT chest dated  December 03, 2017. Chest x-ray dated October 31, 2017. FINDINGS: Stable cardiomediastinal silhouette status post CABG. Normal pulmonary vascularity. Atherosclerotic calcification of the aortic arch. No focal consolidation, pleural effusion, or pneumothorax. No acute osseous abnormality. IMPRESSION: No active disease. Electronically Signed   By: Obie Dredge M.D.   On: 12/14/2017 11:55    EKG: Sinus tachycardia with Premature atrial complexes Inferior infarct , age undetermined Abnormal ECG  Time spent:  [facility]  If 7PM-7AM, please contact night-coverage [email or web address] Password [email or web address] 12/14/2017, 11:59 AM

## 2017-12-15 DIAGNOSIS — D500 Iron deficiency anemia secondary to blood loss (chronic): Secondary | ICD-10-CM

## 2017-12-15 LAB — CBC
HCT: 26 % — ABNORMAL LOW (ref 39.0–52.0)
HEMOGLOBIN: 8.6 g/dL — AB (ref 13.0–17.0)
MCH: 31.3 pg (ref 26.0–34.0)
MCHC: 33.1 g/dL (ref 30.0–36.0)
MCV: 94.5 fL (ref 78.0–100.0)
PLATELETS: 246 10*3/uL (ref 150–400)
RBC: 2.75 MIL/uL — ABNORMAL LOW (ref 4.22–5.81)
RDW: 13.6 % (ref 11.5–15.5)
WBC: 13 10*3/uL — ABNORMAL HIGH (ref 4.0–10.5)

## 2017-12-15 LAB — GLUCOSE, CAPILLARY
GLUCOSE-CAPILLARY: 125 mg/dL — AB (ref 65–99)
GLUCOSE-CAPILLARY: 127 mg/dL — AB (ref 65–99)
GLUCOSE-CAPILLARY: 127 mg/dL — AB (ref 65–99)
Glucose-Capillary: 193 mg/dL — ABNORMAL HIGH (ref 65–99)

## 2017-12-15 MED ORDER — TAMSULOSIN HCL 0.4 MG PO CAPS
0.4000 mg | ORAL_CAPSULE | Freq: Every day | ORAL | Status: DC
  Administered 2017-12-15: 0.4 mg via ORAL
  Filled 2017-12-15: qty 1

## 2017-12-15 MED ORDER — ZOLPIDEM TARTRATE 5 MG PO TABS
2.5000 mg | ORAL_TABLET | Freq: Once | ORAL | Status: AC
  Administered 2017-12-15: 2.5 mg via ORAL
  Filled 2017-12-15: qty 1

## 2017-12-15 MED ORDER — SENNA 8.6 MG PO TABS
2.0000 | ORAL_TABLET | Freq: Every day | ORAL | Status: DC
  Administered 2017-12-16: 17.2 mg via ORAL
  Filled 2017-12-15 (×2): qty 2

## 2017-12-15 MED ORDER — METOPROLOL TARTRATE 25 MG PO TABS
25.0000 mg | ORAL_TABLET | Freq: Two times a day (BID) | ORAL | Status: DC
  Administered 2017-12-15 – 2017-12-16 (×2): 25 mg via ORAL
  Filled 2017-12-15 (×2): qty 1

## 2017-12-15 NOTE — Progress Notes (Signed)
Physical Therapy Treatment Patient Details Name: Jeffrey Nelson MRN: [id number] DOB: [birth year] Today's Date: 12/15/2017    History of Present Illness 83 yo admitted after syncope at orthopedics office. PMHx: severe back pain s/p microdiscectomy 3/18, CAD, DM 2, HTN, MI, CVA, HOH; now s\p TLIF L3-4, L4-5 on 12-11-16.    PT Comments     Pt improving steadily.  Moving with minimal assist at worst and mostly min guard assist.  Is now mobilizing enough to get to and attempt stairs.  Wife wants a short rehab stay, yet pt may surpass the need and do fine with HHPT.    Follow Up Recommendations  Home health PT;Supervision for mobility/OOB;Other (comment)(wife wishes to look into rehab.)     Equipment Recommendations  None recommended by PT    Recommendations for Other Services       Precautions / Restrictions Precautions Precautions: Back;Fall Precaution Booklet Issued: No Required Braces or Orthoses: Spinal Brace Spinal Brace: Lumbar corset;Applied in sitting position    Mobility  Bed Mobility Overal bed mobility: Needs Assistance Bed Mobility: Rolling;Sidelying to Sit Rolling: Supervision Sidelying to sit: Min assist       General bed mobility comments: still needing cuing for technique.  Min truncal stability  Transfers Overall transfer level: Needs assistance Equipment used: Rolling walker (2 wheeled) Transfers: Sit to/from Stand Sit to Stand: Min guard Stand pivot transfers: Min guard       General transfer comment: cues for sequencing and hand placement on arm rests   Ambulation/Gait Ambulation/Gait assistance: Min guard Ambulation Distance (Feet): 200 Feet Assistive device: Rolling walker (2 wheeled) Gait Pattern/deviations: Step-through pattern;Decreased stride length Gait velocity: slower   General Gait Details: frequent cues for posture and proximity to RW, no further needs to provide support.   Stairs             Wheelchair Mobility     Modified Rankin (Stroke Patients Only)       Balance Overall balance assessment: Needs assistance   Sitting balance-Leahy Scale: Good     Standing balance support: During functional activity;Single extremity supported;Bilateral upper extremity supported Standing balance-Leahy Scale: Fair                              Cognition Arousal/Alertness: Awake/alert Behavior During Therapy: WFL for tasks assessed/performed Overall Cognitive Status: Within Functional Limits for tasks assessed                                 General Comments: wife reporting pt. has increased confusion      Exercises      General Comments General comments (skin integrity, edema, etc.): Reinforced all education with pt and wife.  Wife worried about taking pt home at present level.  She feels she can not handle him due to her lack of ability to assist him.      Pertinent Vitals/Pain Pain Assessment: Faces Faces Pain Scale: Hurts little more Pain Location: lower back Pain Descriptors / Indicators: Aching Pain Intervention(s): Monitored during session    Home Living                      Prior Function            PT Goals (current goals can now be found in the care plan section) Acute Rehab PT Goals Patient Stated Goal: to go home  PT Goal Formulation: With patient/family Time For Goal Achievement: 12/20/17 Potential to Achieve Goals: Good Progress towards PT goals: Progressing toward goals    Frequency    Min 5X/week      PT Plan Current plan remains appropriate    Co-evaluation              AM-PAC PT "6 Clicks" Daily Activity  Outcome Measure  Difficulty turning over in bed (including adjusting bedclothes, sheets and blankets)?: A Little Difficulty moving from lying on back to sitting on the side of the bed? : Unable Difficulty sitting down on and standing up from a chair with arms (e.g., wheelchair, bedside commode, etc,.)?: Unable Help  needed moving to and from a bed to chair (including a wheelchair)?: A Little Help needed walking in hospital room?: A Little Help needed climbing 3-5 steps with a railing? : A Little 6 Click Score: 14    End of Session Equipment Utilized During Treatment: Back brace Activity Tolerance: Patient tolerated treatment well Patient left: with call bell/phone within reach;in chair;with family/visitor present Nurse Communication: Mobility status PT Visit Diagnosis: Other abnormalities of gait and mobility (R26.89);Pain     Time: 1250-1320 PT Time Calculation (min) (ACUTE ONLY): 30 min  Charges:  $Gait Training: 8-22 mins $Therapeutic Activity: 8-22 mins                    G Codes:       12/15/2017  [place] BingKen Mannat Benedetti, PT [phone]  (pager)   Eliseo GumKenneth V Nyanna Heideman 12/15/2017, 2:39 PM

## 2017-12-15 NOTE — Progress Notes (Signed)
Patient ID: Jeffrey Nelson, male   DOB: 12-26-1934, 83 y.o.   MRN: [id number] No acute changes.  He has not slept well and his wife was concerned about his speech being slurred.  He follows commands appropriately this am, but is somewhat confused.  CBC ordered for this am.  His vitals are also stable.  This may be just due to a combination of his extensive surgery, the pain meds needed, his lack of sleep, and being in an unfamiler environment.  Appreciated the Hospitalist following him as well.

## 2017-12-15 NOTE — Progress Notes (Signed)
PROGRESS NOTE    Jeffrey Nelson  [id number] DOB: [birth year] DOA: 12/03/2017 PCP: Deloris Pingyter-Brown, Sherry M, MD    Assessment & Plan:   Principal Problem:   Tachycardia Active Problems:   Spinal stenosis, lumbar region, with neurogenic claudication   Diabetes mellitus type 2, uncomplicated (HCC)   Status post lumbar laminectomy   Syncope   Dehydration   Chronic back pain   Hypotension   Subclavian artery stenosis, right (HCC)   Anemia due to blood loss   Leukocytosis   1. Sinus tachycardia.  Felt to be multifactorial due to mild dehydration, pain related as well as lack of home dose of beta-blocker.  Metoprolol has since been restarted and overall heart rate is improving.  We will continue to titrate metoprolol back to home dose.  EKG without acute changes.  No shortness of breath or hypoxia. 2. Leukocytosis.  Patient is nontoxic-appearing.  No evidence of ongoing infection.  Overall trending down.  Urinalysis is unremarkable. 3. Diabetes.  Diet controlled.  Continue on sliding scale insulin.  Blood sugars have been stable. 4. Hypertension.  Holding amlodipine and lisinopril for now.  Resume metoprolol.  Blood pressure currently stable. 5. Confusion.  Patient did become confused and had some slurring of speech.  Suspect this is related to medications.  Will discontinue Ambien as well as Dilaudid for now.  He is chronically on oxycodone and appears to be tolerating this.  Overall mental status has improved.   DVT prophylaxis: SCDs Code Status: Full code Family Communication: Discussed with wife at the bedside Disposition Plan: Discharge home per orthopedics   Antimicrobials:       Subjective: Feeling better today.  Confusion has improved.  Had some nausea earlier today.  Had a small bowel movement today.  Objective: Vitals:   12/15/17 1202 12/15/17 1600 12/15/17 1629 12/15/17 1900  BP: (!) 167/67  (!) 149/61   Pulse: 100 (!) 103 97   Resp: 20 16 16    Temp: 98.3 F  (36.8 C)  98.6 F (37 C) 97.8 F (36.6 C)  TempSrc: Oral  Oral Oral  SpO2: 100% 97% 99%   Weight:      Height:        Intake/Output Summary (Last 24 hours) at 12/15/2017 2025 Last data filed at 12/15/2017 1900 Gross per 24 hour  Intake 2215 ml  Output 820 ml  Net 1395 ml   Filed Weights   12/10/17 0500 12/13/17 0700 12/15/17 0540  Weight: 93 kg (205 lb 0.4 oz) 95.5 kg (210 lb 8.6 oz) 95.1 kg (209 lb 10.5 oz)    Examination:  General exam: Appears calm and comfortable  Respiratory system: Clear to auscultation. Respiratory effort normal. Cardiovascular system: S1 & S2 heard, RRR. No JVD, murmurs, rubs, gallops or clicks. No pedal edema. Gastrointestinal system: Abdomen is nondistended, soft and nontender. No organomegaly or masses felt. Normal bowel sounds heard. Central nervous system: Alert and oriented. No focal neurological deficits. Extremities: Symmetric 5 x 5 power. Skin: No rashes, lesions or ulcers Psychiatry: Judgement and insight appear normal. Mood & affect appropriate.     Data Reviewed: I have personally reviewed following labs and imaging studies  CBC: Recent Labs  Lab 12/12/17 0802 12/12/17 1705 12/13/17 1824 12/14/17 1230 12/15/17 0923  WBC 12.0* 17.8* 16.7* 15.5* 13.0*  NEUTROABS  --  12.5* 11.3* 11.9*  --   HGB 10.2* 8.7* 8.8* 8.3* 8.6*  HCT 29.9* 26.7* 26.9* 25.2* 26.0*  MCV 93.7 93.7 94.7 94.4 94.5  PLT  189 189 206 213 246   Basic Metabolic Panel: Recent Labs  Lab 12/11/17 1626 12/11/17 1734 12/12/17 0802 12/14/17 1230  NA 138 139 135 133*  K 5.8* 4.4 4.7 4.1  CL  --   --  103 96*  CO2  --   --  24 29  GLUCOSE  --  136* 166* 172*  BUN  --   --  20 14  CREATININE  --   --  0.85 0.81  CALCIUM  --   --  8.8* 8.7*   GFR: Estimated Creatinine Clearance: 83.2 mL/min (by C-G formula based on SCr of 0.81 mg/dL). Liver Function Tests: No results for input(s): AST, ALT, ALKPHOS, BILITOT, PROT, ALBUMIN in the last 168 hours. No results  for input(s): LIPASE, AMYLASE in the last 168 hours. No results for input(s): AMMONIA in the last 168 hours. Coagulation Profile: No results for input(s): INR, PROTIME in the last 168 hours. Cardiac Enzymes: Recent Labs  Lab 12/12/17 1915 12/13/17 0357  TROPONINI 0.04* 0.04*   BNP (last 3 results) No results for input(s): PROBNP in the last 8760 hours. HbA1C: No results for input(s): HGBA1C in the last 72 hours. CBG: Recent Labs  Lab 12/14/17 2122 12/14/17 2210 12/15/17 0735 12/15/17 1229 12/15/17 1629  GLUCAP 189* 199* 125* 127* 127*   Lipid Profile: No results for input(s): CHOL, HDL, LDLCALC, TRIG, CHOLHDL, LDLDIRECT in the last 72 hours. Thyroid Function Tests: No results for input(s): TSH, T4TOTAL, FREET4, T3FREE, THYROIDAB in the last 72 hours. Anemia Panel: No results for input(s): VITAMINB12, FOLATE, FERRITIN, TIBC, IRON, RETICCTPCT in the last 72 hours. Sepsis Labs: No results for input(s): PROCALCITON, LATICACIDVEN in the last 168 hours.  No results found for this or any previous visit (from the past 240 hour(s)).       Radiology Studies: Dg Chest Port 1v Same Day  Result Date: 12/14/2017 CLINICAL DATA:  Fever. EXAM: PORTABLE CHEST 1 VIEW COMPARISON:  CT chest dated December 03, 2017. Chest x-ray dated October 31, 2017. FINDINGS: Stable cardiomediastinal silhouette status post CABG. Normal pulmonary vascularity. Atherosclerotic calcification of the aortic arch. No focal consolidation, pleural effusion, or pneumothorax. No acute osseous abnormality. IMPRESSION: No active disease. Electronically Signed   By: Obie Dredge M.D.   On: 12/14/2017 11:55        Scheduled Meds: . aspirin EC  81 mg Oral Daily  . feeding supplement (ENSURE ENLIVE)  237 mL Oral BID BM  . feeding supplement (ENSURE ENLIVE)  237 mL Oral TID BM  . feeding supplement (PRO-STAT SUGAR FREE 64)  30 mL Oral Daily  . ferrous gluconate  324 mg Oral TID WC  . gabapentin  300 mg Oral BID  .  insulin aspart  0-5 Units Subcutaneous QHS  . insulin aspart  0-9 Units Subcutaneous TID WC  . metoprolol tartrate  25 mg Oral BID  . polyethylene glycol  17 g Oral Daily  . senna  2 tablet Oral Daily  . sodium chloride flush  3 mL Intravenous Q12H  . tamsulosin  0.4 mg Oral QPC supper   Continuous Infusions: . sodium chloride Stopped (12/13/17 0700)  . lactated ringers 0 mL/hr at 12/11/17 1743  . methocarbamol (ROBAXIN)  IV       LOS: 8 days    Time spent: 25 minutes    Erick Blinks, MD [facility] Pager [phone]  If 7PM-7AM, please contact night-coverage [email or web address] Password [email or web address] 12/15/2017, 8:25 PM

## 2017-12-15 NOTE — Progress Notes (Signed)
Pt confused this morning upon initially waking up, Ortho MD notified and examined patient, called and updated wife.

## 2017-12-15 NOTE — Progress Notes (Signed)
Occupational Therapy Treatment Patient Details Name: Leafy HalfMelvin W Tissue MRN: [id number] DOB: October 25, 1934 Today's Date: 12/15/2017    History of present illness 83 yo admitted after syncope at orthopedics office. PMHx: severe back pain s/p microdiscectomy 3/18, CAD, DM 2, HTN, MI, CVA, HOH; now s\p TLIF L3-4, L4-5 on 12-11-16.   OT comments  Pt. Seen for skilled OT session with focus on ub adl and grooming task completion while adhering to back precautions.  Wife present for session with complaints that pt. Is confused today.  Also wants to speak with sw/cm about d/c options with concerns on how she will manage at home especially if confusion continues. Questions about post acute therapy options.    Follow Up Recommendations  Supervision/Assistance - 24 hour    Equipment Recommendations  None recommended by OT    Recommendations for Other Services      Precautions / Restrictions Precautions Precautions: Back;Fall Required Braces or Orthoses: (pt. in b.room with CNA with no brace on upon my arrival to room.  they report no brace required if to/from b.room.  offered the brace as pt. c/o back pain and he declined it) Restrictions Weight Bearing Restrictions: No       Mobility Bed Mobility               General bed mobility comments: pt. standing in b.room with CNA upon arrival into room  Transfers Overall transfer level: Needs assistance Equipment used: Rolling walker (2 wheeled) Transfers: Sit to/from UGI CorporationStand;Stand Pivot Transfers Sit to Stand: Min guard Stand pivot transfers: Min guard       General transfer comment: cues for sequencing and hand placement on arm rests     Balance                                           ADL either performed or assessed with clinical judgement   ADL Overall ADL's : Needs assistance/impaired     Grooming: Wash/dry face;Wash/dry hands;Standing;Min guard                       Toileting- ArchitectClothing Manipulation  and Hygiene: Sit to/from stand;Minimal assistance Toileting - Clothing Manipulation Details (indicate cue type and reason): CNA assisting pt. as i entered the b.room for ot session     Functional mobility during ADLs: Minimal assistance General ADL Comments: pts. wife present for session.  c/o pt.s increased confusion.  rn awarea and md notified and had checked pt. this am.  pts. wife expressing concern that she can not currently manage him and his care esp. with his confusion.  requests a sw/cm to provide guidance and post acute options for his therapy.  (note PT rec. HH and OT with no follow up)     Vision       Perception     Praxis      Cognition Arousal/Alertness: Awake/alert Behavior During Therapy: WFL for tasks assessed/performed Overall Cognitive Status: Impaired/Different from baseline                                 General Comments: wife reporting pt. has increased confusion        Exercises     Shoulder Instructions       General Comments      Pertinent Vitals/ Pain  Pain Assessment: (did not rate but mentioned lower back multiple times) Pain Location: lower back Pain Descriptors / Indicators: Aching Pain Intervention(s): Limited activity within patient's tolerance;Monitored during session;Repositioned  Home Living                                          Prior Functioning/Environment              Frequency  Min 2X/week        Progress Toward Goals  OT Goals(current goals can now be found in the care plan section)  Progress towards OT goals: Progressing toward goals     Plan Discharge plan remains appropriate    Co-evaluation                 AM-PAC PT "6 Clicks" Daily Activity     Outcome Measure   Help from another person eating meals?: None Help from another person taking care of personal grooming?: A Little Help from another person toileting, which includes using toliet, bedpan, or  urinal?: A Lot Help from another person bathing (including washing, rinsing, drying)?: A Lot Help from another person to put on and taking off regular upper body clothing?: A Lot Help from another person to put on and taking off regular lower body clothing?: A Lot 6 Click Score: 15    End of Session Equipment Utilized During Treatment: Rolling walker  OT Visit Diagnosis: Unsteadiness on feet (R26.81);Other abnormalities of gait and mobility (R26.89);Muscle weakness (generalized) (M62.81);Pain Pain - Right/Left: Left Pain - part of body: Leg   Activity Tolerance Patient tolerated treatment well   Patient Left in chair;with call bell/phone within reach;with family/visitor present   Nurse Communication Other (comment)(pt.s spouse requesting d/c planning with sw/cm)        Time: 0946-1000 OT Time Calculation (min): 14 min  Charges: OT General Charges $OT Visit: 1 Visit OT Treatments $Self Care/Home Management : 8-22 mins  Robet Leu, COTA/L 12/15/2017, 11:30 AM

## 2017-12-15 NOTE — Progress Notes (Signed)
1900: Handoff report received from RN. Pt resting in bed. During assessment, pt expressing frustration with progress of physical therapy. Pt reports new pain in his right leg after his surgery 4/16 that is hindering his mobility, but main source of pain continues to be lumbar surgical incision. Discussed plan of care for the shift; pt amenable to plan.  2200: Nurse tech talked with pt about ambulating, and pt reported chest pain. Upon assessment, pt endorses this same chest pain as being recurring for years; he reports that his doctor attributed it to post-MI angina. Pt agrees this is likely r/t anxiety at the thought of ambulating. Pt went on to ambulate about the unit using FWW; he reported great improvement in pain.  0000: Pt resting comfortably.  0400: Pt continues resting comfortably.  0700: Handoff report given to RN. No acute events overnight.

## 2017-12-15 NOTE — Progress Notes (Signed)
Patient pulled off tele and is getting up out of the bed. Patient able to state his name and where he is. Patient stated that he still cannot sleep. Patient is assisted up to the chair. Pateint is given snack and drink. TV is turned on. Pt call bell is placed in reach.

## 2017-12-15 NOTE — Progress Notes (Signed)
Patient has insomnia after receiving Ambien 5 g PO at 22:30, see eMAR. Notified [facility] and received orders for Ambien 2.5mg  PO x 1 dose. Patient bed alarm is on for safety. Will cont to monitor.

## 2017-12-16 LAB — GLUCOSE, CAPILLARY
GLUCOSE-CAPILLARY: 142 mg/dL — AB (ref 65–99)
GLUCOSE-CAPILLARY: 135 mg/dL — AB (ref 65–99)
GLUCOSE-CAPILLARY: 142 mg/dL — AB (ref 65–99)
Glucose-Capillary: 129 mg/dL — ABNORMAL HIGH (ref 65–99)
Glucose-Capillary: 193 mg/dL — ABNORMAL HIGH (ref 65–99)

## 2017-12-16 LAB — BASIC METABOLIC PANEL
Anion gap: 9 (ref 5–15)
BUN: 15 mg/dL (ref 6–20)
CALCIUM: 8.8 mg/dL — AB (ref 8.9–10.3)
CO2: 29 mmol/L (ref 22–32)
Chloride: 97 mmol/L — ABNORMAL LOW (ref 101–111)
Creatinine, Ser: 0.75 mg/dL (ref 0.61–1.24)
GFR calc non Af Amer: >60 mL/min (ref >60)
GLUCOSE: 138 mg/dL — AB (ref 65–99)
POTASSIUM: 3.9 mmol/L (ref 3.5–5.1)
Sodium: 135 mmol/L (ref 135–145)

## 2017-12-16 MED ORDER — METOPROLOL TARTRATE 12.5 MG HALF TABLET
37.5000 mg | ORAL_TABLET | Freq: Two times a day (BID) | ORAL | Status: DC
  Administered 2017-12-16 – 2017-12-17 (×2): 37.5 mg via ORAL
  Filled 2017-12-16 (×2): qty 1

## 2017-12-16 MED ORDER — TAMSULOSIN HCL 0.4 MG PO CAPS
0.4000 mg | ORAL_CAPSULE | Freq: Every day | ORAL | Status: DC
  Administered 2017-12-17: 0.4 mg via ORAL
  Filled 2017-12-16: qty 1

## 2017-12-16 NOTE — Progress Notes (Signed)
CCMD notified RN that patient converted back to NSR from Afib at 1000.

## 2017-12-16 NOTE — Progress Notes (Signed)
Patient ID: Leafy HalfMelvin W Kinnear, male   DOB: [birth year], 83 y.o.   MRN: [id number] Making better progress.  Has been up with therapy.  Good strength in his legs and looks better in general.  Vitals are stable.  Back dressings only with scant drainage.  Speech also better.  Therapy notes state that he will be able to go home after his acute hospital stay.  Can likely transfer out of the unit to a regular floor bed today.

## 2017-12-16 NOTE — Progress Notes (Signed)
MD office paged; message sent to MD-- no need to transfer patient as this progressive floor allows patient to stay until discharge.  Continue to monitor patient.

## 2017-12-16 NOTE — Progress Notes (Signed)
1900: Handoff report received from RN. Pt assisted back to bed from bathroom. Discussed plan of care for the shift; pt amenable to plan.  0000: Pt resting comfortably.  0400: Pt continues resting comfortably.  0700: Handoff report given to RN. No acute events overnight.

## 2017-12-16 NOTE — Progress Notes (Signed)
PROGRESS NOTE    Jeffrey Nelson  ZOX:[id number] DOB: 1935-08-19 DOA: 12/03/2017 PCP: Deloris Pingyter-Brown, Sherry M, MD    Assessment & Plan:   Principal Problem:   Tachycardia Active Problems:   Spinal stenosis, lumbar region, with neurogenic claudication   Diabetes mellitus type 2, uncomplicated (HCC)   Status post lumbar laminectomy   Syncope   Dehydration   Chronic back pain   Hypotension   Subclavian artery stenosis, right (HCC)   Anemia due to blood loss   Leukocytosis   1. Sinus tachycardia.  Felt to be multifactorial due to mild dehydration, pain related as well as lack of home dose of beta-blocker.  Metoprolol has since been restarted and overall heart rate is improving.  Still having bursts of tachycardia.  Although heart rate is reported as irregular on telemetry, this appears to be sinus tachycardia with PACs.  We will continue to titrate metoprolol back to home dose.  EKG without acute changes.  No shortness of breath or hypoxia. 2. Leukocytosis.  Patient is nontoxic-appearing.  No evidence of ongoing infection.  Overall trending down.  Urinalysis is unremarkable. 3. Diabetes.  Diet controlled.  Continue on sliding scale insulin.  Blood sugars have been stable. 4. Hypertension.  Holding amlodipine and lisinopril for now.  Resume metoprolol.  Blood pressure currently stable. 5. Confusion.  Patient did become confused and had some slurring of speech.  Suspect this is related to medications.  Ambien and Dilaudid have been discontinued.  Overall mental status appears to be back to baseline.  6. Chronic pain.  Patient is chronically on oxycodone.  He wishes to avoid using narcotics and rely more on Tylenol at this time.   DVT prophylaxis: SCDs Code Status: Full code Family Communication: Discussed with wife at the bedside Disposition Plan: Discharge home per orthopedics   Antimicrobials:       Subjective: Sitting up in chair.  Feeling better today.  No vomiting.  Overall  back pain is improving.  Wants to avoid using narcotics for pain  Objective: Vitals:   12/16/17 0800 12/16/17 1240 12/16/17 1600 12/16/17 1712  BP: 135/67 (!) 133/48 (!) 127/57   Pulse: 86 87 88   Resp: 14 18 16    Temp: 98.2 F (36.8 C) 97.6 F (36.4 C)  98.7 F (37.1 C)  TempSrc: Oral Oral  Oral  SpO2: 98% 100% 96%   Weight:      Height:        Intake/Output Summary (Last 24 hours) at 12/16/2017 1825 Last data filed at 12/16/2017 1600 Gross per 24 hour  Intake 237 ml  Output 1020 ml  Net -783 ml   Filed Weights   12/13/17 0700 12/15/17 0540 12/16/17 0300  Weight: 95.5 kg (210 lb 8.6 oz) 95.1 kg (209 lb 10.5 oz) 94.1 kg (207 lb 7.3 oz)    Examination:  General exam: Alert, awake, oriented x 3 Respiratory system: Clear to auscultation. Respiratory effort normal. Cardiovascular system: Regular rhythm with frequent PACs. No murmurs, rubs, gallops. Gastrointestinal system: Abdomen is nondistended, soft and nontender. No organomegaly or masses felt. Normal bowel sounds heard. Central nervous system: Alert and oriented. No focal neurological deficits. Extremities: No C/C/E, +pedal pulses Skin: No rashes, lesions or ulcers Psychiatry: Judgement and insight appear normal. Mood & affect appropriate.   Data Reviewed: I have personally reviewed following labs and imaging studies  CBC: Recent Labs  Lab 12/12/17 0802 12/12/17 1705 12/13/17 1824 12/14/17 1230 12/15/17 0923  WBC 12.0* 17.8* 16.7* 15.5* 13.0*  NEUTROABS  --  12.5* 11.3* 11.9*  --   HGB 10.2* 8.7* 8.8* 8.3* 8.6*  HCT 29.9* 26.7* 26.9* 25.2* 26.0*  MCV 93.7 93.7 94.7 94.4 94.5  PLT 189 189 206 213 246   Basic Metabolic Panel: Recent Labs  Lab 12/11/17 1626 12/11/17 1734 12/12/17 0802 12/14/17 1230 12/16/17 0435  NA 138 139 135 133* 135  K 5.8* 4.4 4.7 4.1 3.9  CL  --   --  103 96* 97*  CO2  --   --  24 29 29   GLUCOSE  --  136* 166* 172* 138*  BUN  --   --  20 14 15   CREATININE  --   --  0.85 0.81  0.75  CALCIUM  --   --  8.8* 8.7* 8.8*   GFR: Estimated Creatinine Clearance: 84.2 mL/min (by C-G formula based on SCr of 0.75 mg/dL). Liver Function Tests: No results for input(s): AST, ALT, ALKPHOS, BILITOT, PROT, ALBUMIN in the last 168 hours. No results for input(s): LIPASE, AMYLASE in the last 168 hours. No results for input(s): AMMONIA in the last 168 hours. Coagulation Profile: No results for input(s): INR, PROTIME in the last 168 hours. Cardiac Enzymes: Recent Labs  Lab 12/12/17 1915 12/13/17 0357  TROPONINI 0.04* 0.04*   BNP (last 3 results) No results for input(s): PROBNP in the last 8760 hours. HbA1C: No results for input(s): HGBA1C in the last 72 hours. CBG: Recent Labs  Lab 12/15/17 2152 12/16/17 0539 12/16/17 0806 12/16/17 1240 12/16/17 1712  GLUCAP 193* 129* 142* 135* 142*   Lipid Profile: No results for input(s): CHOL, HDL, LDLCALC, TRIG, CHOLHDL, LDLDIRECT in the last 72 hours. Thyroid Function Tests: No results for input(s): TSH, T4TOTAL, FREET4, T3FREE, THYROIDAB in the last 72 hours. Anemia Panel: No results for input(s): VITAMINB12, FOLATE, FERRITIN, TIBC, IRON, RETICCTPCT in the last 72 hours. Sepsis Labs: No results for input(s): PROCALCITON, LATICACIDVEN in the last 168 hours.  No results found for this or any previous visit (from the past 240 hour(s)).       Radiology Studies: No results found.      Scheduled Meds: . aspirin EC  81 mg Oral Daily  . feeding supplement (ENSURE ENLIVE)  237 mL Oral TID BM  . feeding supplement (PRO-STAT SUGAR FREE 64)  30 mL Oral Daily  . ferrous gluconate  324 mg Oral TID WC  . gabapentin  300 mg Oral BID  . insulin aspart  0-5 Units Subcutaneous QHS  . insulin aspart  0-9 Units Subcutaneous TID WC  . metoprolol tartrate  37.5 mg Oral BID  . polyethylene glycol  17 g Oral Daily  . senna  2 tablet Oral Daily  . sodium chloride flush  3 mL Intravenous Q12H  . tamsulosin  0.4 mg Oral Daily    Continuous Infusions: . sodium chloride Stopped (12/13/17 0700)  . lactated ringers 0 mL/hr at 12/11/17 1743  . methocarbamol (ROBAXIN)  IV       LOS: 9 days    Time spent: 25 minutes    Erick Blinks, MD [facility] Pager [phone]  If 7PM-7AM, please contact night-coverage [email or web address] Password [facility] 12/16/2017, 6:25 PM

## 2017-12-17 DIAGNOSIS — D72829 Elevated white blood cell count, unspecified: Secondary | ICD-10-CM

## 2017-12-17 DIAGNOSIS — E119 Type 2 diabetes mellitus without complications: Secondary | ICD-10-CM

## 2017-12-17 DIAGNOSIS — R000 Tachycardia, unspecified: Secondary | ICD-10-CM

## 2017-12-17 LAB — GLUCOSE, CAPILLARY
GLUCOSE-CAPILLARY: 127 mg/dL — AB (ref 65–99)
GLUCOSE-CAPILLARY: 200 mg/dL — AB (ref 65–99)

## 2017-12-17 MED ORDER — METFORMIN HCL 500 MG PO TABS: 500.0000 mg | ORAL_TABLET | Freq: Two times a day (BID) | ORAL | 0 refills | Status: DC

## 2017-12-17 MED ORDER — POLYETHYLENE GLYCOL 3350 17 G PO PACK: 17.0000 g | PACK | Freq: Every day | ORAL | 0 refills | Status: DC

## 2017-12-17 MED ORDER — METFORMIN HCL 500 MG PO TABS: 500.0000 mg | ORAL_TABLET | Freq: Two times a day (BID) | ORAL | Status: DC

## 2017-12-17 MED ORDER — METOPROLOL TARTRATE 50 MG PO TABS: 50.0000 mg | ORAL_TABLET | Freq: Two times a day (BID) | ORAL | 11 refills | Status: AC

## 2017-12-17 MED ORDER — FERROUS GLUCONATE 324 (38 FE) MG PO TABS: 324.0000 mg | ORAL_TABLET | Freq: Three times a day (TID) | ORAL | 0 refills | Status: DC

## 2017-12-17 MED ORDER — FLUTICASONE PROPIONATE 50 MCG/ACT NA SUSP
1.0000 | Freq: Every day | NASAL | Status: DC
  Administered 2017-12-17: 1 via NASAL
  Filled 2017-12-17: qty 16

## 2017-12-17 MED ORDER — METOPROLOL TARTRATE 37.5 MG PO TABS: 50.0000 mg | ORAL_TABLET | Freq: Two times a day (BID) | ORAL | 0 refills | Status: DC

## 2017-12-17 MED ORDER — OXYCODONE-ACETAMINOPHEN 5-325 MG PO TABS: 1.0000 | ORAL_TABLET | Freq: Four times a day (QID) | ORAL | 0 refills | Status: DC | PRN

## 2017-12-17 MED ORDER — GABAPENTIN 300 MG PO CAPS
300.0000 mg | ORAL_CAPSULE | Freq: Every day | ORAL | Status: DC
Start: 2017-12-18 — End: 2017-12-17

## 2017-12-17 MED ORDER — METOPROLOL TARTRATE 37.5 MG PO TABS: 37.5000 mg | ORAL_TABLET | Freq: Two times a day (BID) | ORAL | 0 refills | Status: DC

## 2017-12-17 MED ORDER — GABAPENTIN 300 MG PO CAPS: 300.0000 mg | ORAL_CAPSULE | Freq: Every day | ORAL | 0 refills | Status: DC

## 2017-12-17 NOTE — Discharge Summary (Signed)
Physician Discharge Summary  Jeffrey Nelson ZOX:[id number] DOB: October 20, 1934 DOA: 12/03/2017  PCP: Deloris Ping, MD  Admit date: 12/03/2017 Discharge date: 12/17/2017  Time spent: 25 minutes  Recommendations for Outpatient Follow-up:  F/u with orthopedics in 1-2 weeks F/u with PCP in 3-7 days as needed  Discharge Diagnoses:  Principal Problem:   Tachycardia Active Problems:   Spinal stenosis, lumbar region, with neurogenic claudication   Diabetes mellitus type 2, uncomplicated (HCC)   Status post lumbar laminectomy   Syncope   Dehydration   Chronic back pain   Hypotension   Subclavian artery stenosis, right (HCC)   Anemia due to blood loss   Leukocytosis   Discharge Condition: stable   Diet recommendation: carb modified   Filed Weights   12/15/17 0540 12/16/17 0300 12/17/17 0408  Weight: 95.1 kg (209 lb 10.5 oz) 94.1 kg (207 lb 7.3 oz) 92.4 kg (203 lb 11.3 oz)    History of present illness:   83 year old male with history of coronary artery 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  Patient is chronically on oxycodone. underwent  LUMBAR THREE-FOUR,LUMBAR FOUR-FIVE TRANSFORAMINAL LUMBAR INTERBODY FUSION WITH PEDICLE SCREWS, RODS, CAGES, LOCAL AND ALLOGRAFT BONE GRAFT, VIVIGEN (N/A). - Post  Op stable. Improving. Cont per orthopedics. outpatient follow up  Sinus tachycardia. Improved. - Felt to be multifactorial due to mild dehydration, pain related as well as lack of home dose of beta-blocker.  Metoprolol has since been restarted and overall heart rate is improved. titrated metoprolol back to home dose.  EKG without acute changes.  No shortness of breath or hypoxia.  Leukocytosis. Afebrile. Patient is nontoxic-appearing.  No evidence of ongoing infection.  Overall trending  down.  Urinalysis is unremarkable.  Diabetes.  Diet controlled.  Continue on sliding scale insulin.  Blood sugars have been stable. Started on metformin. Recommended to f/u with PCP at discharge   Hypertension.  BP is stable off amlodipine and lisinopril.  Resume metoprolol.  Blood pressure currently stable.  Confusion.  Patient did become confused and had some slurring of speech.  Suspect this is related to medications.  Ambien and Dilaudid have been discontinued.  Overall mental status appears to be back to baseline.   H/o CAD. Stable on plavix, aspirin. BB   Procedures: LUMBAR THREE-FOUR,LUMBAR FOUR-FIVE TRANSFORAMINAL LUMBAR INTERBODY FUSION WITH PEDICLE SCREWS, RODS, CAGES, LOCAL AND ALLOGRAFT BONE GRAFT, VIVIGEN (N/A)       (i.e. Studies not automatically included, echos, thoracentesis, etc; not x-rays)  Consultations:  Orthopedics   Discharge Exam: Vitals:   12/17/17 0700 12/17/17 0800  BP:  123/72  Pulse: 84 97  Resp: 18 13  Temp:  97.8 F (36.6 C)  SpO2: 95% 94%    General: no distress 3 Cardiovascular: s1,s2 rrr Respiratory: CTA BL  Discharge Instructions  Discharge Instructions    Call MD / Call 911   Complete by:  As directed    If you experience chest pain or shortness of breath, CALL 911 and be transported to the hospital emergency room.  If you develope a fever above 101 F, pus (white drainage) or increased drainage or redness at the wound, or calf pain, call your surgeon's office.   Constipation Prevention   Complete by:  As directed    Drink plenty of fluids.  Prune juice may be  helpful.  You may use a stool softener, such as Colace (over the counter) 100 mg twice a day.  Use MiraLax (over the counter) for constipation as needed.   Diet - low sodium heart healthy   Complete by:  As directed    Discharge instructions   Complete by:  As directed    Call if there is increasing drainage, fever greater than 101.5, severe head aches, and worsening  nausea or light sensitivity. If shortness of breath, bloody cough or chest tightness or pain go to an emergency room. No lifting greater than 10 lbs. Avoid bending, stooping and twisting. Use brace when sitting and out of bed even to go to bathroom. Walk in house for first 2 weeks then may start to get out slowly increasing distances up to one mile by 4-6 weeks post op. May shower and change dressing following bathing with shower.When bathing remove the brace shower and replace brace before getting out of the shower. If drainage, keep dry dressing and do not bathe the incision, use an moisture impervious dressing. Please call and return for scheduled follow up appointment 2 weeks from the time of surgery. Please make an appointment to See your primary care physician Dr. Elige Radon in one week to assess and continue to  monitor and treat hypertension, tachycardia and anemia.   Driving restrictions   Complete by:  As directed    No driving for 4 weeks   Increase activity slowly as tolerated   Complete by:  As directed    Lifting restrictions   Complete by:  As directed    No lifting for 8 weeks     Allergies as of 12/17/2017      Reactions   Ezetimibe Other (See Comments)   Cramps   Morphine And Related Other (See Comments)   Confusion   Statins Other (See Comments)   Cramps      Medication List    STOP taking these medications   amLODipine 10 MG tablet Commonly known as:  NORVASC   lisinopril 5 MG tablet Commonly known as:  PRINIVIL,ZESTRIL   oxyCODONE 20 mg 12 hr tablet Commonly known as:  OXYCONTIN     TAKE these medications   aspirin EC 81 MG tablet Take 81 mg by mouth daily.   clopidogrel 75 MG tablet Commonly known as:  PLAVIX Take 75 mg by mouth daily.   docusate sodium 100 MG capsule Commonly known as:  COLACE Take 200 mg by mouth 2 (two) times daily as needed for mild constipation.   ferrous gluconate 324 MG tablet Commonly known as:  FERGON Take 1  tablet (324 mg total) by mouth 3 (three) times daily with meals.   gabapentin 300 MG capsule Commonly known as:  NEURONTIN Take 1 capsule (300 mg total) by mouth at bedtime. What changed:  See the new instructions.   meloxicam 7.5 MG tablet Commonly known as:  MOBIC Take 7.5 mg by mouth daily.   metFORMIN 500 MG tablet Commonly known as:  GLUCOPHAGE Take 1 tablet (500 mg total) by mouth 2 (two) times daily with a meal.   metoprolol tartrate 50 MG tablet Commonly known as:  LOPRESSOR Take 1 tablet (50 mg total) by mouth 2 (two) times daily.   multivitamin with minerals Tabs tablet Take 1 tablet by mouth daily.   oxyCODONE-acetaminophen 5-325 MG tablet Commonly known as:  PERCOCET/ROXICET Take 1-2 tablets by mouth every 6 (six) hours as needed for severe pain. What changed:  how much  to take   polyethylene glycol packet Commonly known as:  MIRALAX / GLYCOLAX Take 17 g by mouth daily. Start taking on:  12/18/2017   REPATHA 140 MG/ML Sosy Generic drug:  Evolocumab Inject 140 mg into the skin every 14 (fourteen) days.   tamsulosin 0.4 MG Caps capsule Commonly known as:  FLOMAX Take 0.4 mg by mouth daily after supper.      Allergies  Allergen Reactions  . Ezetimibe Other (See Comments)    Cramps  . Morphine And Related Other (See Comments)    Confusion  . Statins Other (See Comments)    Cramps   Follow-up Information    Kerrin Champagne, MD In 2 weeks.   Specialty:  Orthopedic Surgery Why:  For wound re-check Contact information: [address] [place] [address] [phone]        Deloris Ping, MD Follow up in 1 week(s).   Specialty:  Family Medicine Why:  Need follow up of anemia, glucose intolerance, hypertension, tachycardia.  Contact information: [address] [place] [address] [phone]            The results of significant diagnostics from this hospitalization (including imaging, microbiology,  ancillary and laboratory) are listed below for reference.    Significant Diagnostic Studies: Dg Lumbar Spine Complete  Result Date: 12/11/2017 CLINICAL DATA:  INTRAOPERATIVE FLUOROSCOPY: LUMBAR THREE-FOUR,LUMBAR FOUR-FIVE TRANSFORAMINAL LUMBAR INTERBODY FUSION WITH PEDICLE SCREWS, RODS, CAGES, LOCAL AND ALLOGRAFT BONE GRAFT, VIVIGEN RSTO: [facility] FLUORO TIME: 100SECS EXAM: DG C-ARM 61-120 MIN; LUMBAR SPINE - COMPLETE 4+ VIEW COMPARISON:  MR lumbar spine 12/05/2017 FINDINGS: Using the numbering convention of comparison lumbar MRI, posterior fusion from L3-L5 with pedicle screws and posterior fusion rods. Interbody spacers at L3-L4 and L4-L5 IMPRESSION: Posterior lumbar fusion as above. Electronically Signed   By: Genevive Bi M.D.   On: 12/11/2017 20:09   Dg Abd 1 View  Result Date: 12/03/2017 CLINICAL DATA:  Constipation. EXAM: ABDOMEN - 1 VIEW COMPARISON:  CT angiography 3 hours prior. FINDINGS: Normal bowel gas pattern. No free intra-abdominal air. Small volume of colonic stool is seen on CT earlier this day. Excreted IV contrast within the urinary bladder. Linear density projecting over the upper abdomen is presumed external to the patient without correlate on CT earlier this day. IMPRESSION: Normal bowel gas pattern.  Small volume of colonic stool. Electronically Signed   By: Rubye Oaks M.D.   On: 12/03/2017 22:33   Mr Lumbar Spine W Wo Contrast  Result Date: 12/05/2017 CLINICAL DATA:  Extreme LEFT leg pain. Status post lumbar laminectomy November 12, 2017 and October 13, 2017. EXAM: MRI LUMBAR SPINE WITHOUT AND WITH CONTRAST TECHNIQUE: Multiplanar and multiecho pulse sequences of the lumbar spine were obtained without and with intravenous contrast. CONTRAST:  19mL MULTIHANCE GADOBENATE DIMEGLUMINE 529 MG/ML IV SOLN COMPARISON:  MRI lumbar spine November 11, 2017 FINDINGS: SEGMENTATION: For the purposes of this report, the last well-formed intervertebral disc is reported as L5-S1 consistent with  prior imaging. ALIGNMENT: Maintained lumbar lordosis. No malalignment. VERTEBRAE:Status post redo LEFT L3-4 and LEFT L4-5 laminectomies. Status post recent RIGHT L3-4 hemilaminectomy. Vertebral bodies intact. L3-4 disc edema in severe enhancing discogenic endplate changes. No associated abnormal disc enhancement. Stable moderate to severe L4-5 disc height loss with mild multilevel disc desiccation. Moderate L4-5 subacute discogenic endplate changes. Multilevel mild chronic discogenic endplate changes with trace discogenic endplate enhancement. CONUS MEDULLARIS AND CAUDA EQUINA: Conus medullaris terminates at T12-L1 and demonstrates normal morphology and signal  characteristics. Anteriorly displaced cauda equina at L5-S1 junction due to new lentiform 0.9 x 2.2 x 4.9 cm (AP by transverse by CC) posterior fluid collection with dependent T1 shortening and fluid fluid levels, no enhancement. It is unclear if this fluid collection is intrathecal or extradural. Cauda equina is otherwise unremarkable without nerve root clumping or abnormal enhancement. PARASPINAL AND OTHER SOFT TISSUES: RIGHT greater than LEFT paraspinal muscle denervation at and below the level surgical and intervention. Irregular 3 x 1.3 cm fluid collection within the LEFT L3 surgical bed with faint marginal enhancement most compatible with seroma. DISC LEVELS: L1-2: Annular bulging. No canal stenosis. Mild RIGHT neural foraminal narrowing. L2-3: Similar moderate broad-based disc bulge. Mild facet arthropathy and ligamentum flavum redundancy. Minimal canal stenosis. Moderate RIGHT, mild LEFT neural foraminal narrowing. L3-4: Bilateral laminectomies. Similar 6 x 19 mm fluid collection within RIGHT laminectomy site extending to discectomy margin with thick rim enhancement effacing the RIGHT lateral recess. Enhancement extends into the RIGHT facet with slight RIGHT facet widening new from prior study. Granulation tissue encasing the traversing LEFT L4 nerve.  No canal stenosis though there is slight medial displacement of the traversing RIGHT L4 nerve. Severe bilateral neural foraminal narrowing. Extensive annular enhancement consistent with discectomies without residual or recurrent disc protrusion. L4-5: Interval apparent redo LEFT hemilaminectomy hemilaminectomy. Small broad-based disc bulge asymmetric to the LEFT similar to prior examination with enhancing annular fissures. Mild RIGHT facet arthropathy. No canal stenosis. Mild RIGHT, moderate to severe LEFT neural foraminal narrowing. Granulation tissue LEFT lateral recess encasing the traversing LEFT L5 nerve. L5-S1: Transitional anatomy with pseudoarthrosis. No disc bulge, canal stenosis nor neural foraminal narrowing. IMPRESSION: 1. Status post interval LEFT L3-4 and suspected L4-5 redo laminectomies. Exuberant discogenic endplate changes L3-4, less likely discitis osteomyelitis. 2. Recent RIGHT L3-4 hemilaminectomy. Stable 6 x 9 mm fluid collection within RIGHT surgical bed equivocal for abscess, however there is new enhancing RIGHT L3-4 facet effusion and mild widening may be infectious or inflammatory. 3. New 0.9 x 2.2 x 4.9 cm subacute hematoma at L5-S1 anteriorly displacing the cauda equina. 4. No canal stenosis. L2-3 through L4-5 neural foraminal narrowing: Severe at L3-4. Electronically Signed   By: Awilda Metro M.D.   On: 12/05/2017 14:22   Dg Chest Port 1v Same Day  Result Date: 12/14/2017 CLINICAL DATA:  Fever. EXAM: PORTABLE CHEST 1 VIEW COMPARISON:  CT chest dated December 03, 2017. Chest x-ray dated October 31, 2017. FINDINGS: Stable cardiomediastinal silhouette status post CABG. Normal pulmonary vascularity. Atherosclerotic calcification of the aortic arch. No focal consolidation, pleural effusion, or pneumothorax. No acute osseous abnormality. IMPRESSION: No active disease. Electronically Signed   By: Obie Dredge M.D.   On: 12/14/2017 11:55   Dg C-arm 1-60 Min  Result Date:  12/11/2017 CLINICAL DATA:  INTRAOPERATIVE FLUOROSCOPY: LUMBAR THREE-FOUR,LUMBAR FOUR-FIVE TRANSFORAMINAL LUMBAR INTERBODY FUSION WITH PEDICLE SCREWS, RODS, CAGES, LOCAL AND ALLOGRAFT BONE GRAFT, VIVIGEN RSTO: [facility] FLUORO TIME: 100SECS EXAM: DG C-ARM 61-120 MIN; LUMBAR SPINE - COMPLETE 4+ VIEW COMPARISON:  MR lumbar spine 12/05/2017 FINDINGS: Using the numbering convention of comparison lumbar MRI, posterior fusion from L3-L5 with pedicle screws and posterior fusion rods. Interbody spacers at L3-L4 and L4-L5 IMPRESSION: Posterior lumbar fusion as above. Electronically Signed   By: Genevive Bi M.D.   On: 12/11/2017 20:09   Dg C-arm 1-60 Min  Result Date: 12/11/2017 CLINICAL DATA:  INTRAOPERATIVE FLUOROSCOPY: LUMBAR THREE-FOUR,LUMBAR FOUR-FIVE TRANSFORAMINAL LUMBAR INTERBODY FUSION WITH PEDICLE SCREWS, RODS, CAGES, LOCAL AND ALLOGRAFT BONE GRAFT, VIVIGEN RSTO:  [facility] FLUORO TIME: 100SECS EXAM: DG C-ARM 61-120 MIN; LUMBAR SPINE - COMPLETE 4+ VIEW COMPARISON:  MR lumbar spine 12/05/2017 FINDINGS: Using the numbering convention of comparison lumbar MRI, posterior fusion from L3-L5 with pedicle screws and posterior fusion rods. Interbody spacers at L3-L4 and L4-L5 IMPRESSION: Posterior lumbar fusion as above. Electronically Signed   By: Genevive BiStewart  Edmunds M.D.   On: 12/11/2017 20:09   Dg C-arm 1-60 Min  Result Date: 12/11/2017 CLINICAL DATA:  INTRAOPERATIVE FLUOROSCOPY: LUMBAR THREE-FOUR,LUMBAR FOUR-FIVE TRANSFORAMINAL LUMBAR INTERBODY FUSION WITH PEDICLE SCREWS, RODS, CAGES, LOCAL AND ALLOGRAFT BONE GRAFT, VIVIGEN RSTO: [facility] FLUORO TIME: 100SECS EXAM: DG C-ARM 61-120 MIN; LUMBAR SPINE - COMPLETE 4+ VIEW COMPARISON:  MR lumbar spine 12/05/2017 FINDINGS: Using the numbering convention of comparison lumbar MRI, posterior fusion from L3-L5 with pedicle screws and posterior fusion rods. Interbody spacers at L3-L4 and L4-L5 IMPRESSION: Posterior lumbar fusion as above. Electronically Signed   By: Genevive BiStewart  Edmunds M.D.    On: 12/11/2017 20:09   Ct Angio Chest/abd/pel For Dissection W And/or W/wo  Result Date: 12/03/2017 CLINICAL DATA:  Back pain. EXAM: CT ANGIOGRAPHY CHEST, ABDOMEN AND PELVIS TECHNIQUE: Multidetector CT imaging through the chest, abdomen and pelvis was performed using the standard protocol during bolus administration of intravenous contrast. Multiplanar reconstructed images and MIPs were obtained and reviewed to evaluate the vascular anatomy. CONTRAST:  100mL ISOVUE-370 IOPAMIDOL (ISOVUE-370) INJECTION 76% COMPARISON:  CT scan of April 02, 2008. FINDINGS: CTA CHEST FINDINGS Cardiovascular: Atherosclerosis of thoracic aorta is noted without aneurysm or dissection. Status post coronary artery bypass graft. No pericardial effusion is noted. Severe stenosis is noted at the origin of the right subclavian artery. Otherwise great vessels are widely patent. Mediastinum/Nodes: No enlarged mediastinal, hilar, or axillary lymph nodes. Thyroid gland, trachea, and esophagus demonstrate no significant findings. Lungs/Pleura: Lungs are clear. No pleural effusion or pneumothorax. Musculoskeletal: No chest wall abnormality. No acute or significant osseous findings. Review of the MIP images confirms the above findings. CTA ABDOMEN AND PELVIS FINDINGS VASCULAR Aorta: Atherosclerosis of abdominal aorta is noted without aneurysm or dissection. Celiac: Patent without evidence of aneurysm, dissection, vasculitis or significant stenosis. SMA: Patent without evidence of aneurysm, dissection, vasculitis or significant stenosis. Renals: Both renal arteries are patent without evidence of aneurysm, dissection, vasculitis, fibromuscular dysplasia or significant stenosis. IMA: Patent without evidence of aneurysm, dissection, vasculitis or significant stenosis. Inflow: Patent without evidence of aneurysm, dissection, vasculitis or significant stenosis. Veins: No obvious venous abnormality within the limitations of this arterial phase study.  Review of the MIP images confirms the above findings. NON-VASCULAR Hepatobiliary: No focal liver abnormality is seen. No gallstones, gallbladder wall thickening, or biliary dilatation. Pancreas: Unremarkable. No pancreatic ductal dilatation or surrounding inflammatory changes. Spleen: Normal in size without focal abnormality. Adrenals/Urinary Tract: Adrenal glands are unremarkable. Kidneys are normal, without renal calculi, focal lesion, or hydronephrosis. Bladder is unremarkable. Stomach/Bowel: Stomach is within normal limits. Appendix appears normal. No evidence of bowel wall thickening, distention, or inflammatory changes. Lymphatic: No significant adenopathy is noted. Reproductive: Prostate is unremarkable. Other: No abdominal wall hernia or abnormality. No abdominopelvic ascites. Musculoskeletal: No acute or significant osseous findings. Review of the MIP images confirms the above findings. IMPRESSION: No evidence of thoracic or abdominal aortic dissection or aneurysm. Severe stenosis is noted at the origin of the right subclavian artery from the right innominate artery. No acute abnormality seen in the abdomen or pelvis. Electronically Signed   By: Lupita RaiderJames  Green Jr, M.D.   On: 12/03/2017 19:31  Xr Lumbar Spine 2-3 Views  Result Date: 11/22/2017 Low grade 1-2 mm anterolisthesis L4-5, Mild DDD L1-2, L2-3 and L3-4, laminotomies bilateral L3-4, atheroscerotic changes aortoiliac No change since last study.    Microbiology: No results found for this or any previous visit (from the past 240 hour(s)).   Labs: Basic Metabolic Panel: Recent Labs  Lab 12/11/17 1626 12/11/17 1734 12/12/17 0802 12/14/17 1230 12/16/17 0435  NA 138 139 135 133* 135  K 5.8* 4.4 4.7 4.1 3.9  CL  --   --  103 96* 97*  CO2  --   --  24 29 29   GLUCOSE  --  136* 166* 172* 138*  BUN  --   --  20 14 15   CREATININE  --   --  0.85 0.81 0.75  CALCIUM  --   --  8.8* 8.7* 8.8*   Liver Function Tests: No results for input(s):  AST, ALT, ALKPHOS, BILITOT, PROT, ALBUMIN in the last 168 hours. No results for input(s): LIPASE, AMYLASE in the last 168 hours. No results for input(s): AMMONIA in the last 168 hours. CBC: Recent Labs  Lab 12/12/17 0802 12/12/17 1705 12/13/17 1824 12/14/17 1230 12/15/17 0923  WBC 12.0* 17.8* 16.7* 15.5* 13.0*  NEUTROABS  --  12.5* 11.3* 11.9*  --   HGB 10.2* 8.7* 8.8* 8.3* 8.6*  HCT 29.9* 26.7* 26.9* 25.2* 26.0*  MCV 93.7 93.7 94.7 94.4 94.5  PLT 189 189 206 213 246   Cardiac Enzymes: Recent Labs  Lab 12/12/17 1915 12/13/17 0357  TROPONINI 0.04* 0.04*   BNP: BNP (last 3 results) No results for input(s): BNP in the last 8760 hours.  ProBNP (last 3 results) No results for input(s): PROBNP in the last 8760 hours.  CBG: Recent Labs  Lab 12/16/17 1240 12/16/17 1712 12/16/17 2158 12/17/17 0714 12/17/17 1201  GLUCAP 135* 142* 193* 127* 200*       Signed:  Jonette Mate N  [facility] 12/17/2017, 12:24 PM

## 2017-12-17 NOTE — Progress Notes (Signed)
     Subjective: 6 Days Post-Op Procedure(s) (LRB): LUMBAR THREE-FOUR,LUMBAR FOUR-FIVE TRANSFORAMINAL LUMBAR INTERBODY FUSION WITH PEDICLE SCREWS, RODS, CAGES, LOCAL AND ALLOGRAFT BONE GRAFT, VIVIGEN (N/A)Awake, alert and oriented x 4. Up with PT in hall and performed stairs today. Minimal leg pain occasional right. Diet is better. Has mild glucose intolerance c/w Type 2 diabeted with keep on  Metformin 500 mg BID with meals. BP is normal on metoprolol 37.5 mg BID. Anemia is stable on Ferrous gluconate 324 TID.  Patient reports pain as moderate.    Objective:   VITALS:  Temp:  [97.6 F (36.4 C)-98.7 F (37.1 C)] 97.8 F (36.6 C) (04/22 0800) Pulse Rate:  [77-97] 97 (04/22 0800) Resp:  [13-18] 13 (04/22 0800) BP: (114-134)/(48-87) 123/72 (04/22 0800) SpO2:  [94 %-100 %] 94 % (04/22 0800) Weight:  [203 lb 11.3 oz (92.4 kg)] 203 lb 11.3 oz (92.4 kg) (04/22 0408)  Neurologically intact ABD soft Neurovascular intact Sensation intact distally Intact pulses distally Dorsiflexion/Plantar flexion intact Incision: no drainage   LABS Recent Labs    12/14/17 1230 12/15/17 0923  HGB 8.3* 8.6*  WBC 15.5* 13.0*  PLT 213 246   Recent Labs    12/14/17 1230 12/16/17 0435  NA 133* 135  K 4.1 3.9  CL 96* 97*  CO2 29 29  BUN 14 15  CREATININE 0.81 0.75  GLUCOSE 172* 138*   No results for input(s): LABPT, INR in the last 72 hours.   Assessment/Plan: 6 Days Post-Op Procedure(s) (LRB): LUMBAR THREE-FOUR,LUMBAR FOUR-FIVE TRANSFORAMINAL LUMBAR INTERBODY FUSION WITH PEDICLE SCREWS, RODS, CAGES, LOCAL AND ALLOGRAFT BONE GRAFT, VIVIGEN (N/A)  Anemia Hypertension Tachycardia controlled with metoprolol. Confusion resolved.   Advance diet Up with therapy Discharge home with home health  Started metformin 500 mg BID. Continue on oxycodone IR 5mg  1-2 po every 4-6 hours prn pain.  Discussed plan to discharge with Dr. Kerry HoughMemon and he agrees Need follow up with his primary care in one  week.   Vira BrownsJames Burnette Valenti 12/17/2017, 11:59 AMPatient ID: Jeffrey Nelson, male   DOB: 1935/03/18, 83 y.o.   MRN: [id number]

## 2017-12-17 NOTE — Progress Notes (Signed)
DC instructions given to patient.  Verbalized understanding.  No s/s of any acute distress or c/o pain.  Transported to car via wc at this time.  Leaving with wife.

## 2017-12-17 NOTE — Progress Notes (Signed)
Physical Therapy Treatment Patient Details Name: Jeffrey Nelson MRN: [id number] DOB: [birth year] Today's Date: 12/17/2017    History of Present Illness 83 yo admitted after syncope at orthopedics office. PMHx: severe back pain s/p microdiscectomy 3/18, CAD, DM 2, HTN, MI, CVA, HOH; now s\p TLIF L3-4, L4-5 on 12-11-16.    PT Comments    Has progressed well lately, Ready for discharge from mobility standpoint.  All education reinforced.    Follow Up Recommendations  Home health PT;Supervision for mobility/OOB;Other (comment)     Equipment Recommendations  None recommended by PT    Recommendations for Other Services       Precautions / Restrictions Precautions Precautions: Back;Fall Precaution Booklet Issued: No Required Braces or Orthoses: Spinal Brace Spinal Brace: Lumbar corset;Applied in sitting position Restrictions Weight Bearing Restrictions: No    Mobility  Bed Mobility Overal bed mobility: Needs Assistance Bed Mobility: Rolling;Sidelying to Sit;Sit to Sidelying Rolling: Supervision Sidelying to sit: Supervision;Min guard     Sit to sidelying: Min guard General bed mobility comments: cued for technique, otherwise no assist and no compensations needed.  Transfers Overall transfer level: Needs assistance Equipment used: Rolling walker (2 wheeled) Transfers: Sit to/from Stand Sit to Stand: Supervision Stand pivot transfers: Supervision       General transfer comment: cues for sequencing  Ambulation/Gait Ambulation/Gait assistance: Supervision Ambulation Distance (Feet): 500 Feet Assistive device: Rolling walker (2 wheeled) Gait Pattern/deviations: Step-through pattern Gait velocity: slower   General Gait Details: steady with good posture.   Stairs Stairs: Yes Stairs assistance: Supervision Stair Management: One rail Right;Step to pattern;Forwards Number of Stairs: 5 General stair comments: safe with rail and wife's supervision   Wheelchair  Mobility    Modified Rankin (Stroke Patients Only)       Balance Overall balance assessment: Needs assistance Sitting-balance support: Feet supported Sitting balance-Leahy Scale: Good     Standing balance support: During functional activity;Single extremity supported;Bilateral upper extremity supported Standing balance-Leahy Scale: Fair                              Cognition Arousal/Alertness: Awake/alert Behavior During Therapy: WFL for tasks assessed/performed Overall Cognitive Status: Within Functional Limits for tasks assessed                                        Exercises      General Comments General comments (skin integrity, edema, etc.): reinforced all back care with pt and wife      Pertinent Vitals/Pain Pain Assessment: Faces Faces Pain Scale: Hurts little more Pain Location: lower back and leg Pain Descriptors / Indicators: Aching    Home Living                      Prior Function            PT Goals (current goals can now be found in the care plan section) Acute Rehab PT Goals Patient Stated Goal: to go home Time For Goal Achievement: 12/20/17 Potential to Achieve Goals: Good Progress towards PT goals: Progressing toward goals    Frequency    Min 5X/week      PT Plan Current plan remains appropriate    Co-evaluation              AM-PAC PT "6 Clicks" Daily Activity  Outcome Measure  Difficulty  turning over in bed (including adjusting bedclothes, sheets and blankets)?: A Little Difficulty moving from lying on back to sitting on the side of the bed? : A Little Difficulty sitting down on and standing up from a chair with arms (e.g., wheelchair, bedside commode, etc,.)?: A Little Help needed moving to and from a bed to chair (including a wheelchair)?: A Little Help needed walking in hospital room?: A Little Help needed climbing 3-5 steps with a railing? : A Little 6 Click Score: 18    End of  Session Equipment Utilized During Treatment: Back brace Activity Tolerance: Patient tolerated treatment well Patient left: with call bell/phone within reach;in chair;with family/visitor present Nurse Communication: Mobility status PT Visit Diagnosis: Other abnormalities of gait and mobility (R26.89);Pain Pain - part of body: Leg     Time: 1129-1203 PT Time Calculation (min) (ACUTE ONLY): 34 min  Charges:  $Gait Training: 8-22 mins $Therapeutic Activity: 8-22 mins                    G Codes:       Jan 09, 2018  [place] Bing, PT [phone]  (pager)   [name] 01-09-18, 12:12 PM

## 2017-12-17 NOTE — Progress Notes (Signed)
Occupational Therapy Treatment Patient Details Name: Jeffrey Nelson MRN: [id number] DOB: 08-18-1935 Today's Date: 12/17/2017    History of present illness 83 yo admitted after syncope at orthopedics office. PMHx: severe back pain s/p microdiscectomy 3/18, CAD, DM 2, HTN, MI, CVA, HOH; now s\p TLIF L3-4, L4-5 on 12-11-16.   OT comments  Pt progressing towards established OT goals. Pt donning shirt, brace, pants, and bedroom slippers with supervision-Min Guard A for safety in standing. Pt demonstrating adherence to back precautions. Pt planning to dc later today. Answer all questions and reviewing education in preparation for dc today. Continue to recommend dc home with 24 hour assistance.   Follow Up Recommendations  Supervision/Assistance - 24 hour    Equipment Recommendations  None recommended by OT    Recommendations for Other Services PT consult    Precautions / Restrictions Precautions Precautions: Back;Fall Precaution Booklet Issued: No Precaution Comments: Pt abel to recall 2/3 back precautions and then looks to his wife for assistance. Wife providing Min visual cues for no lifting. Required Braces or Orthoses: Spinal Brace Spinal Brace: Lumbar corset;Applied in sitting position Restrictions Weight Bearing Restrictions: No       Mobility Bed Mobility Overal bed mobility: Needs Assistance Bed Mobility: Rolling;Sidelying to Sit;Sit to Sidelying Rolling: Supervision Sidelying to sit: Supervision     Sit to sidelying: Min guard General bed mobility comments: supervision for safety and bed positioned to simulated home set up  Transfers Overall transfer level: Needs assistance Equipment used: Rolling walker (2 wheeled) Transfers: Sit to/from Stand Sit to Stand: Supervision Stand pivot transfers: Supervision       General transfer comment: cues for sequencing    Balance Overall balance assessment: Needs assistance Sitting-balance support: Feet supported Sitting  balance-Leahy Scale: Good     Standing balance support: During functional activity;Single extremity supported;Bilateral upper extremity supported Standing balance-Leahy Scale: Fair                             ADL either performed or assessed with clinical judgement   ADL Overall ADL's : Needs assistance/impaired                 Upper Body Dressing : Set up;Supervision/safety;Sitting Upper Body Dressing Details (indicate cue type and reason): Pt able to don shirt and brace with supervision. VCs for tightness of brace.  Lower Body Dressing: Min guard;Sit to/from stand Lower Body Dressing Details (indicate cue type and reason): Pt donning pants with MIn Guard A for safety in standing.  Toilet Transfer: Min guard;Ambulation;RW(Simulated to recliner)           Functional mobility during ADLs: Min guard;Rolling walker General ADL Comments: Pt demonstrating increased arousal, participation, and adherance to back precautions. Pt dressing himself in preparation for dc later today and adhering to back precautions with MIn educational cues     Vision       Perception     Praxis      Cognition Arousal/Alertness: Awake/alert Behavior During Therapy: WFL for tasks assessed/performed Overall Cognitive Status: Within Functional Limits for tasks assessed                                          Exercises     Shoulder Instructions       General Comments Wife present throughout session    Pertinent Vitals/ Pain  Pain Assessment: Faces Faces Pain Scale: Hurts a little bit Pain Location: lower back and leg Pain Descriptors / Indicators: Aching Pain Intervention(s): Monitored during session;Repositioned  Home Living                                          Prior Functioning/Environment              Frequency  Min 2X/week        Progress Toward Goals  OT Goals(current goals can now be found in the care plan  section)  Progress towards OT goals: Progressing toward goals  Acute Rehab OT Goals Patient Stated Goal: to go home OT Goal Formulation: With patient Time For Goal Achievement: 12/19/17 Potential to Achieve Goals: Good ADL Goals Pt Will Perform Grooming: sitting;with set-up;with supervision Pt Will Perform Upper Body Dressing: with set-up;with supervision;sitting Pt Will Perform Lower Body Dressing: with min guard assist;sit to/from stand Pt Will Transfer to Toilet: with min guard assist;ambulating;bedside commode Additional ADL Goal #1: Pt will perform bed mobility with supervision in preparation for ADLs  Plan Discharge plan remains appropriate    Co-evaluation                 AM-PAC PT "6 Clicks" Daily Activity     Outcome Measure   Help from another person eating meals?: None Help from another person taking care of personal grooming?: A Little Help from another person toileting, which includes using toliet, bedpan, or urinal?: A Little Help from another person bathing (including washing, rinsing, drying)?: A Little Help from another person to put on and taking off regular upper body clothing?: A Little Help from another person to put on and taking off regular lower body clothing?: A Little 6 Click Score: 19    End of Session Equipment Utilized During Treatment: Rolling walker  OT Visit Diagnosis: Unsteadiness on feet (R26.81);Other abnormalities of gait and mobility (R26.89);Muscle weakness (generalized) (M62.81);Pain Pain - Right/Left: Left Pain - part of body: Leg   Activity Tolerance Patient tolerated treatment well   Patient Left in chair;with call bell/phone within reach;with family/visitor present   Nurse Communication Mobility status        Time: 1610-9604 OT Time Calculation (min): 14 min  Charges: OT General Charges $OT Visit: 1 Visit OT Treatments $Self Care/Home Management : 8-22 mins  Kerin Cecchi MSOT, OTR/L Acute Rehab Pager:  [phone] Office: [phone]   Theodoro Grist Tasheena [place]olt 12/17/2017, 1:26 PM

## 2017-12-24 DIAGNOSIS — M5417 Radiculopathy, lumbosacral region: Secondary | ICD-10-CM

## 2017-12-26 ENCOUNTER — Ambulatory Visit (INDEPENDENT_AMBULATORY_CARE_PROVIDER_SITE_OTHER): Payer: Self-pay

## 2017-12-26 ENCOUNTER — Encounter (INDEPENDENT_AMBULATORY_CARE_PROVIDER_SITE_OTHER): Payer: Self-pay | Admitting: Specialist

## 2017-12-26 ENCOUNTER — Ambulatory Visit (INDEPENDENT_AMBULATORY_CARE_PROVIDER_SITE_OTHER): Payer: Medicare HMO | Admitting: Specialist

## 2017-12-26 VITALS — BP 103/51 | HR 81 | Temp 97.6°F | Ht 74.0 in | Wt 194.0 lb

## 2017-12-26 DIAGNOSIS — S32030S Wedge compression fracture of third lumbar vertebra, sequela: Secondary | ICD-10-CM

## 2017-12-26 DIAGNOSIS — Z09 Encounter for follow-up examination after completed treatment for conditions other than malignant neoplasm: Secondary | ICD-10-CM

## 2017-12-26 DIAGNOSIS — Z981 Arthrodesis status: Secondary | ICD-10-CM

## 2017-12-26 DIAGNOSIS — S22000A Wedge compression fracture of unspecified thoracic vertebra, initial encounter for closed fracture: Secondary | ICD-10-CM

## 2017-12-26 MED ORDER — OXYCODONE-ACETAMINOPHEN 5-325 MG PO TABS: 1.0000 | ORAL_TABLET | Freq: Four times a day (QID) | ORAL | 0 refills | Status: DC | PRN

## 2017-12-26 NOTE — Patient Instructions (Signed)
Avoid frequent bending and stooping  No lifting greater than 10 lbs. May use ice or moist heat for pain. Weight loss is of benefit. Give primary care a call and tell them his BP in office is 105/51.

## 2017-12-26 NOTE — Progress Notes (Signed)
Post-Op Visit Note   Patient: Jeffrey Nelson           Date of Birth: 1935-03-02           MRN: [id number] Visit Date: 12/26/2017 PCP: Deloris Ping, MD   Assessment & Plan:  Chief Complaint:  Chief Complaint  Patient presents with  . Lower Back - Follow-up, Routine Post Op, Pain   Visit Diagnoses:  1. Postop check   Incision is healed, no drainage, no erythrema. Motor is normal, sensation is with a little numbness . Plan: Avoid frequent bending and stooping  No lifting greater than 10 lbs. May use ice or moist heat for pain. Weight loss is of benefit. Handicap license is approved.    Follow-Up Instructions: No follow-ups on file.   Orders:  Orders Placed This Encounter  Procedures  . XR Lumbar Spine 2-3 Views   No orders of the defined types were placed in this encounter.   Imaging: No results found.  PMFS History: Patient Active Problem List   Diagnosis Date Noted  . Anemia due to blood loss 12/13/2017    Priority: High    Class: Chronic  . Lumbar disc herniation with radiculopathy 10/13/2017    Priority: High    Class: Acute  . Radiculopathy, lumbar region 10/09/2017    Priority: High  . Pain in right hip 10/09/2017    Priority: High    Class: Acute  . Right knee sprain 10/09/2017    Priority: High    Class: Acute  . Spinal stenosis in cervical region 05/14/2015    Priority: High    Class: Chronic  . Cubital tunnel syndrome on left 05/14/2015    Priority: High    Class: Chronic  . Spinal stenosis, lumbar region, with neurogenic claudication 01/16/2014    Priority: High    Class: Chronic  . Diabetes mellitus type 2, uncomplicated (HCC) 10/14/2017    Priority: Medium    Class: Chronic  . Lumbosacral radiculopathy at L3   . Tachycardia 12/14/2017  . Leukocytosis 12/14/2017  . Syncope 12/03/2017  . Dehydration 12/03/2017  . Chronic back pain 12/03/2017  . Hypotension 12/03/2017  . Subclavian artery stenosis, right (HCC)  12/03/2017  . Status post lumbar laminectomy 11/12/2017  . Postoperative pain after spinal surgery 11/11/2017  . Weakness of right lower extremity   . Sciatica associated with disorder of lumbosacral spine   . Surgery, elective   . Hip pain, acute, right 10/09/2017  . Bradycardia 10/09/2017  . Spondylolisthesis, lumbar region 10/05/2017  . Knee contusion 07/24/2016  . Stenosis of cervical spine with myelopathy (HCC) 05/14/2015   Past Medical History:  Diagnosis Date  . Anginal pain (HCC)    occ  . Arthritis    "joints" (10/09/2017)  . Chronic lower back pain   . Complication of anesthesia    extremely claustrophobic; "have to be put down for MRI; don't sit in back seat of car;, etc."  . Coronary artery disease   . High cholesterol    "get shots twice/month" (10/09/2017)  . History of blood transfusion 2001   "related to CABG"  . History of kidney stones   . HOH (hard of hearing)    wearing hearing aids  . Hypertension   . Myocardial infarction (HCC) 2000s-06/2017 X 4  . Stroke [facility]) ~ 2015   "in my right eye; sight is coming back little by little" (10/09/2017)  . Type II diabetes mellitus (HCC)     Family History  Problem Relation Age of Onset  . CAD Brother   . Diabetes Neg Hx     Past Surgical History:  Procedure Laterality Date  . ANTERIOR CERVICAL DECOMP/DISCECTOMY FUSION N/A 05/14/2015   Procedure: C3-4  ANTERIOR CERVICAL DISCECTOMY AND FUSION WITH PLATE AND SCREWS, LOCAL AND ALLOGRAFT BONE GRAFT;  Surgeon: Kerrin Champagne, MD;  Location: [facility] OR;  Service: Orthopedics;  Laterality: N/A;  . BACK SURGERY    . CARDIAC CATHETERIZATION     X 17 (10/09/2017)  . CATARACT EXTRACTION W/ INTRAOCULAR LENS  IMPLANT, BILATERAL Bilateral   . COLONOSCOPY    . CORONARY ANGIOPLASTY  06/2017  . CORONARY ANGIOPLASTY WITH STENT PLACEMENT     "6 stents total" (10/09/2017)  . CORONARY ARTERY BYPASS GRAFT  2001   CABG X4  . CYSTOSCOPY W/ STONE MANIPULATION    . JOINT REPLACEMENT    .  LUMBAR LAMINECTOMY Right 10/13/2017   Procedure: MICRODISCECTOMY LUMBAR LAMINECTOMY RIGHT L3-4;  Surgeon: Kerrin Champagne, MD;  Location: [facility] OR;  Service: Orthopedics;  Laterality: Right;  . LUMBAR LAMINECTOMY Left 11/12/2017   Procedure: MICRODISCECTOMY L3-4 FOR RECURRENT HNP POSSIBLE FLURO;  Surgeon: Kerrin Champagne, MD;  Location[facility] OR;  Service: Orthopedics;  Laterality: Left;  . LUMBAR LAMINECTOMY/DECOMPRESSION MICRODISCECTOMY N/A 01/16/2014   Procedure: Left L3-4 and L4-5 foraminotomies;  Surgeon: Kerrin Champagne, MD;  Location: [facility] OR;  Service: Orthopedics;  Laterality: N/A;  . NASAL FRACTURE SURGERY    . NASAL SINUS SURGERY     "several times since 1959"  . RADIOLOGY WITH ANESTHESIA N/A 10/12/2017   Procedure: MRI WITH ANESTHESIA;  Surgeon: Radiologist, Medication, MD;  Location: [facility] OR;  Service: Radiology;  Laterality: N/A;  . RADIOLOGY WITH ANESTHESIA N/A 12/05/2017   Procedure: MRI WITH ANESTHESIA;  Surgeon: Radiologist, Medication, MD;  Location: [facility] OR;  Service: Radiology;  Laterality: N/A;  . SHOULDER OPEN ROTATOR CUFF REPAIR Bilateral   . TOTAL KNEE ARTHROPLASTY Right 2012  . TYMPANOSTOMY TUBE PLACEMENT Bilateral   . ULNAR NERVE TRANSPOSITION Left 05/14/2015   Procedure: LEFT ULNAR NERVE DECOMPRESSION AT THE ELBOW;  Surgeon: Kerrin Champagne, MD;  Location: [facility] OR;  Service: Orthopedics;  Laterality: Left;   Social History   Occupational History  . Not on file  Tobacco Use  . Smoking status: Former Smoker    Packs/day: 1.00    Years: 2.00    Pack years: 2.00    Last attempt to quit: 01/09/1957    Years since quitting: 61.0  . Smokeless tobacco: Never Used  Substance and Sexual Activity  . Alcohol use: No  . Drug use: No  . Sexual activity: Not Currently

## 2017-12-27 ENCOUNTER — Telehealth (INDEPENDENT_AMBULATORY_CARE_PROVIDER_SITE_OTHER): Payer: Self-pay | Admitting: Specialist

## 2017-12-27 NOTE — Telephone Encounter (Signed)
Could you please advise on this message please?  Thank You.

## 2017-12-27 NOTE — Telephone Encounter (Signed)
Patty with [facility] called to advise Dr. Otelia Sergeant of a level 2 drug interaction with meloxicam and plavix.Patty # [phone]

## 2017-12-27 NOTE — Telephone Encounter (Signed)
See message from [facility] please advise?

## 2018-01-01 ENCOUNTER — Ambulatory Visit (HOSPITAL_BASED_OUTPATIENT_CLINIC_OR_DEPARTMENT_OTHER)
Admission: RE | Admit: 2018-01-01 | Discharge: 2018-01-01 | Disposition: A | Discharge status: Home / Self Care | Payer: Medicare HMO | Source: Ambulatory Visit | Attending: Specialist | Admitting: Specialist

## 2018-01-01 ENCOUNTER — Telehealth (INDEPENDENT_AMBULATORY_CARE_PROVIDER_SITE_OTHER): Payer: Self-pay | Admitting: Specialist

## 2018-01-01 DIAGNOSIS — S22000A Wedge compression fracture of unspecified thoracic vertebra, initial encounter for closed fracture: Secondary | ICD-10-CM

## 2018-01-01 DIAGNOSIS — S32030S Wedge compression fracture of third lumbar vertebra, sequela: Secondary | ICD-10-CM

## 2018-01-01 NOTE — Telephone Encounter (Signed)
I called and spoke with Amy and she states that the max he can take a day is #6, she asked if she could note on the instructions "max 6 tablets per day", I advised that this would be fine to do.

## 2018-01-01 NOTE — Telephone Encounter (Signed)
Pharmacy called and said according to the new regulations, the instructions written for the percocet need to be modified. Please call her back to verify new directions # [phone]

## 2018-01-14 ENCOUNTER — Telehealth (INDEPENDENT_AMBULATORY_CARE_PROVIDER_SITE_OTHER): Payer: Self-pay

## 2018-01-14 NOTE — Telephone Encounter (Signed)
Patient's wife Lupita Leash called stating that patient is in a lot of pain and he can not walk.  Would like to be worked in sometime this week if possible?  Patient has an appt.scheduled for 01/23/18.

## 2018-01-15 NOTE — Telephone Encounter (Signed)
Patients coming in to see Jeffrey Nelson on Thursday 01/17/18 at 215pm

## 2018-01-17 ENCOUNTER — Ambulatory Visit (INDEPENDENT_AMBULATORY_CARE_PROVIDER_SITE_OTHER): Payer: Self-pay

## 2018-01-17 ENCOUNTER — Ambulatory Visit (INDEPENDENT_AMBULATORY_CARE_PROVIDER_SITE_OTHER): Payer: Medicare HMO | Admitting: Surgery

## 2018-01-17 VITALS — BP 130/74 | HR 81 | Ht 74.0 in | Wt 198.0 lb

## 2018-01-17 DIAGNOSIS — M25552 Pain in left hip: Secondary | ICD-10-CM

## 2018-01-17 DIAGNOSIS — M5442 Lumbago with sciatica, left side: Principal | ICD-10-CM

## 2018-01-17 DIAGNOSIS — G8929 Other chronic pain: Secondary | ICD-10-CM

## 2018-01-17 MED ORDER — TAPENTADOL HCL ER 50 MG PO TB12: 50.0000 mg | ORAL_TABLET | Freq: Two times a day (BID) | ORAL | 0 refills | Status: DC

## 2018-01-17 NOTE — Progress Notes (Signed)
Office Visit Note   Patient: Jeffrey Nelson           Date of Birth: 14-May-1935           MRN: [id number] Visit Date: 01/17/2018              Requested by: Deloris Ping, MD [address] [place], [place] [address] PCP: Deloris Ping, MD   Assessment & Plan: Visit Diagnoses:  1. Chronic left-sided low back pain with left-sided sciatica   2. Pain in left hip     Plan: Patient already has an appointment scheduled Dr. Otelia Sergeant and with he will keep that appointment.  Sent in a prescription for Nucynta for pain.  Follow-Up Instructions: Return for As scheduled next week with Dr. Otelia Sergeant.   Orders:  Orders Placed This Encounter  Procedures   XR Lumbar Spine 2-3 Views   XR HIP UNILAT W OR W/O PELVIS 2-3 VIEWS LEFT   Ambulatory referral to Physical Medicine Rehab   Meds ordered this encounter  Medications   tapentadol (NUCYNTA ER) 50 MG 12 hr tablet    Sig: Take 1 tablet (50 mg total) by mouth every 12 (twelve) hours.    Dispense:  30 tablet    Refill:  0      Procedures: No procedures performed   Clinical Data: No additional findings.   Subjective: Chief Complaint  Patient presents with   Lower Back - Routine Post Op    12/11/17 TLIF L3-4, L4-5    HPI  Review of Systems   Objective: Vital Signs: BP 130/74 (BP Location: Right Arm, Patient Position: Sitting, Cuff Size: Normal)   Pulse 81   Ht  (1.88 m)   Wt 198 lb (89.8 kg)   BMI 25.42 kg/m   Physical Exam  Ortho Exam  Specialty Comments:  No specialty comments available.  Imaging: No results found.   PMFS History: Patient Active Problem List   Diagnosis Date Noted   Lumbosacral radiculopathy at L3    Tachycardia 12/14/2017   Leukocytosis 12/14/2017   Anemia due to blood loss 12/13/2017    Class: Chronic   Syncope 12/03/2017   Dehydration 12/03/2017   Chronic back pain 12/03/2017   Hypotension 12/03/2017   Subclavian artery stenosis, right (HCC)  12/03/2017   Status post lumbar laminectomy 11/12/2017   Postoperative pain after spinal surgery 11/11/2017   Weakness of right lower extremity    Sciatica associated with disorder of lumbosacral spine    Surgery, elective    Diabetes mellitus type 2, uncomplicated (HCC) 10/14/2017    Class: Chronic   Lumbar disc herniation with radiculopathy 10/13/2017    Class: Acute   Radiculopathy, lumbar region 10/09/2017   Pain in right hip 10/09/2017    Class: Acute   Right knee sprain 10/09/2017    Class: Acute   Hip pain, acute, right 10/09/2017   Bradycardia 10/09/2017   Spondylolisthesis, lumbar region 10/05/2017   Knee contusion 07/24/2016   Spinal stenosis in cervical region 05/14/2015    Class: Chronic   Cubital tunnel syndrome on left 05/14/2015    Class: Chronic   Stenosis of cervical spine with myelopathy (HCC) 05/14/2015   Spinal stenosis, lumbar region, with neurogenic claudication 01/16/2014    Class: Chronic   Past Medical History:  Diagnosis Date   Anginal pain (HCC)    occ   Arthritis    "joints" (10/09/2017)   Chronic lower back pain    Complication of anesthesia  extremely claustrophobic; "have to be put down for MRI; don't sit in back seat of car;, etc."   Coronary artery disease    High cholesterol    "get shots twice/month" (10/09/2017)   History of blood transfusion 2001   "related to CABG"   History of kidney stones    HOH (hard of hearing)    wearing hearing aids   Hypertension    Myocardial infarction (HCC) 2000s-06/2017 X 4   Stroke (HCC) ~ 2015   "in my right eye; sight is coming back little by little" (10/09/2017)   Type II diabetes mellitus (HCC)     Family History  Problem Relation Age of Onset   CAD Brother    Diabetes Neg Hx     Past Surgical History:  Procedure Laterality Date   ANTERIOR CERVICAL DECOMP/DISCECTOMY FUSION N/A 05/14/2015   Procedure: C3-4  ANTERIOR CERVICAL DISCECTOMY AND FUSION WITH PLATE AND SCREWS, LOCAL AND ALLOGRAFT  BONE GRAFT;  Surgeon: Kerrin Champagne, MD;  Location: [facility] OR;  Service: Orthopedics;  Laterality: N/A;   BACK SURGERY     CARDIAC CATHETERIZATION     X 17 (10/09/2017)   CATARACT EXTRACTION W/ INTRAOCULAR LENS  IMPLANT, BILATERAL Bilateral    COLONOSCOPY     CORONARY ANGIOPLASTY  06/2017   CORONARY ANGIOPLASTY WITH STENT PLACEMENT     "6 stents total" (10/09/2017)   CORONARY ARTERY BYPASS GRAFT  2001   CABG X4   CYSTOSCOPY W/ STONE MANIPULATION     JOINT REPLACEMENT     LUMBAR LAMINECTOMY Right 10/13/2017   Procedure: MICRODISCECTOMY LUMBAR LAMINECTOMY RIGHT L3-4;  Surgeon: Kerrin Champagne, MD;  Location: [facility] OR;  Service: Orthopedics;  Laterality: Right;   LUMBAR LAMINECTOMY Left 11/12/2017   Procedure: MICRODISCECTOMY L3-4 FOR RECURRENT HNP POSSIBLE FLURO;  Surgeon: Kerrin Champagne, MD;  Location: [facility] OR;  Service: Orthopedics;  Laterality: Left;   LUMBAR LAMINECTOMY/DECOMPRESSION MICRODISCECTOMY N/A 01/16/2014   Procedure: Left L3-4 and L4-5 foraminotomies;  Surgeon: Kerrin Champagne, MD;  Location: [facility] OR;  Service: Orthopedics;  Laterality: N/A;   NASAL FRACTURE SURGERY     NASAL SINUS SURGERY     "several times since 1959"   RADIOLOGY WITH ANESTHESIA N/A 10/12/2017   Procedure: MRI WITH ANESTHESIA;  Surgeon: Radiologist, Medication, MD;  Location: [facility] OR;  Service: Radiology;  Laterality: N/A;   RADIOLOGY WITH ANESTHESIA N/A 12/05/2017   Procedure: MRI WITH ANESTHESIA;  Surgeon: Radiologist, Medication, MD;  Location: [facility] OR;  Service: Radiology;  Laterality: N/A;   SHOULDER OPEN ROTATOR CUFF REPAIR Bilateral    TOTAL KNEE ARTHROPLASTY Right 2012   TYMPANOSTOMY TUBE PLACEMENT Bilateral    ULNAR NERVE TRANSPOSITION Left 05/14/2015   Procedure: LEFT ULNAR NERVE DECOMPRESSION AT THE ELBOW;  Surgeon: Kerrin Champagne, MD;  Location[facility] OR;  Service: Orthopedics;  Laterality: Left;   Social History   Occupational History   Not on file  Tobacco Use   Smoking status: Former Smoker    Packs/day: 1.00     Years: 2.00    Pack years: 2.00    Last attempt to quit: 01/09/1957    Years since quitting: 61.0   Smokeless tobacco: Never Used  Substance and Sexual Activity   Alcohol use: No   Drug use: No   Sexual activity: Not Currently

## 2018-01-22 ENCOUNTER — Ambulatory Visit (HOSPITAL_BASED_OUTPATIENT_CLINIC_OR_DEPARTMENT_OTHER)
Admission: RE | Admit: 2018-01-22 | Discharge: 2018-01-22 | Disposition: A | Discharge status: Home / Self Care | Payer: Medicare HMO | Source: Ambulatory Visit | Attending: Specialist | Admitting: Specialist

## 2018-01-22 DIAGNOSIS — S32039A Unspecified fracture of third lumbar vertebra, initial encounter for closed fracture: Secondary | ICD-10-CM | POA: Insufficient documentation

## 2018-01-22 DIAGNOSIS — X58XXXA Exposure to other specified factors, initial encounter: Secondary | ICD-10-CM | POA: Insufficient documentation

## 2018-01-22 DIAGNOSIS — M47816 Spondylosis without myelopathy or radiculopathy, lumbar region: Secondary | ICD-10-CM | POA: Insufficient documentation

## 2018-01-22 DIAGNOSIS — R9389 Abnormal findings on diagnostic imaging of other specified body structures: Secondary | ICD-10-CM | POA: Insufficient documentation

## 2018-01-22 DIAGNOSIS — S22089A Unspecified fracture of T11-T12 vertebra, initial encounter for closed fracture: Secondary | ICD-10-CM | POA: Insufficient documentation

## 2018-01-23 ENCOUNTER — Ambulatory Visit (INDEPENDENT_AMBULATORY_CARE_PROVIDER_SITE_OTHER): Payer: Medicare HMO | Admitting: Specialist

## 2018-01-23 ENCOUNTER — Encounter (INDEPENDENT_AMBULATORY_CARE_PROVIDER_SITE_OTHER): Payer: Self-pay | Admitting: Specialist

## 2018-01-23 VITALS — BP 113/65 | HR 78 | Ht 74.0 in | Wt 198.0 lb

## 2018-01-23 DIAGNOSIS — Z981 Arthrodesis status: Secondary | ICD-10-CM

## 2018-01-23 DIAGNOSIS — S32030G Wedge compression fracture of third lumbar vertebra, subsequent encounter for fracture with delayed healing: Secondary | ICD-10-CM

## 2018-01-23 DIAGNOSIS — M5416 Radiculopathy, lumbar region: Secondary | ICD-10-CM

## 2018-01-23 MED ORDER — VITAMIN D (ERGOCALCIFEROL) 1.25 MG (50000 UNIT) PO CAPS: 50000.0000 [IU] | ORAL_CAPSULE | ORAL | 0 refills | Status: AC

## 2018-01-23 NOTE — Progress Notes (Signed)
Post-Op Visit Note   Patient: Jeffrey Nelson           Date of Birth: 1934-12-20           MRN: [id number] Visit Date: 01/23/2018 PCP: Wayland Salinas, MD   Assessment & Plan:6 weeks post L3 to L5 fusion, left L3 radiculopathy.  Chief Complaint:  Chief Complaint  Patient presents with  . Lower Back - Pain  Awake, alert and oriented. Legs are NV normal. Incision is healed. Xrays with compression deformities at L1 L3 and L2  Visit Diagnoses:  1. S/P lumbar spinal fusion   2. Left lumbar radiculopathy   3. Closed compression fracture of L3 lumbar vertebra with delayed healing, subsequent encounter     Plan:Avoid frequent bending and stooping  No lifting greater than 10 lbs. May use ice or moist heat for pain. Weight loss is of benefit. Handicap license is approved. PSA, Sed rate, Vit D   Follow-Up Instructions: No follow-ups on file.   Orders:  Orders Placed This Encounter  Procedures  . VITAMIN D 25 Hydroxy (Vit-D Deficiency, Fractures)  . Sed Rate (ESR)  . PSA   Meds ordered this encounter  Medications  . Vitamin D, Ergocalciferol, (DRISDOL) 50000 units CAPS capsule    Sig: Take 1 capsule (50,000 Units total) by mouth every 7 (seven) days.    Dispense:  8 capsule    Refill:  0    Imaging: Dg Bone Density (dxa)  Result Date: 01/22/2018 EXAM: DUAL X-RAY ABSORPTIOMETRY (DXA) FOR BONE MINERAL DENSITY IMPRESSION: Jessy Oto Your patient Jeffrey Nelson completed a BMD test on 01/22/2018 using the [employer]un (analysis version: 16.SP2) manufactured by [employer]. The following summarizes the results of our evaluation. PATIENT: Name: [name], [name] Patient ID: [id number] Birth Date: September 18, 1934 Height: 74.0 in. Gender: Male Measured: 01/22/2018 Weight: 198.8 lbs. Indications: Advanced Age, Caucasian, Diabetic, Low Calcium Intake, Parent hip Fx, Previous Tobacco User, Family Hist. (Parent hip fracture) Fractures: Treatments: Multivitamin ASSESSMENT:  The BMD measured at Femur Neck Right is 0.839 g/cm2 with a T-score of -1.4 is considered moderately low. Treatment is advised if there are other risk factors. L-3 & 4 was excluded due to degenerative changes. Site Region Measured Date Measured Age WHO YA BMD Classification T-score AP Spine L1-L2 01/22/2018 81.3 Normal -0.7 1.079 g/cm2 DualFemur Neck Right 01/22/2018 81.5 years Low Bone Mass -1.4 0.839 g/cm2 World Health Organization [facility]) criteria for post-menopausal, Caucasian Women: Normal        T-score at or above -1 SD Low Bone Mass T-score between -1 and -2.5 SD Osteoporosis  T-score at or below -2.5 SD RECOMMENDATION: 1. All patients should optimize calcium and vitamin D intake. 2. Consider FDA-approved medical therapies in postmenopausal women and men aged 15 years and older, based on the following: a. A hip or vertebral(clinical or morphometric) fracture. b. T-Score < -2.5 at the femoral neck or spine after appropriate evaluation to exclude secondary causes c. Low bone mass (T-score between -1.0 and -2.5 at the femoral neck or spine) and a 10 year probability of a hip fracture >3% or a 10 year probability of major osteoporosis-related fracture > 20% based on the [place]-adapted WHO algorithm d. Clinical judgement and/or patient preferences may indicate treatment for people with 10-year fracture probabilities above or below these levels FOLLOW-UP: People with diagnosed cases of osteoporosis or osteopenia should be regularly tested for bone mineral density. For patients eligible for Medicare, routine testing is allowed once every  2 years. The testing frequency can be increased to one year for patients who have rapidly progressing disease, or for those who are receiving medical therapy to restore bone mass. I have reviewed this report and agree with the above findings.  Radiology Patient: Farris Has Referring Physician: Jessy Oto Birth Date: 1934-11-20 [age] Patient ID: [id number]  Height: 74.0 in. Weight: 198.8 lbs. Measured: 01/22/2018 9:45:44 AM (16 SP 2) Gender: Male Ethnicity: White Analyzed: 01/22/2018 9:56:20 AM (16 SP 2) FRAX* 10-year Probability of Fracture Based on femoral neck BMD: DualFemur (Right) Major Osteoporotic Fracture: 18.4% Hip Fracture:                13.4% Population:                  [place] (Caucasian) Risk Factors:                Family Hist. (Parent hip fracture) *FRAX is a [place] of [employer] for Metabolic Bone Disease, a [place] (WHO) [facility]. ASSESSMENT: The probability of a major osteoporotic fracture is 18.4% within the next ten years. The probability of a hip fracture is 13.4% within the next ten years. Electronically Signed   By: Rolm Baptise M.D.   On: 01/22/2018 10:25    PMFS History: Patient Active Problem List   Diagnosis Date Noted  . Anemia due to blood loss 12/13/2017    Priority: High    Class: Chronic  . Lumbar disc herniation with radiculopathy 10/13/2017    Priority: High    Class: Acute  . Radiculopathy, lumbar region 10/09/2017    Priority: High  . Pain in right hip 10/09/2017    Priority: High    Class: Acute  . Right knee sprain 10/09/2017    Priority: High    Class: Acute  . Spinal stenosis in cervical region 05/14/2015    Priority: High    Class: Chronic  . Cubital tunnel syndrome on left 05/14/2015    Priority: High    Class: Chronic  . Spinal stenosis, lumbar region, with neurogenic claudication 01/16/2014    Priority: High    Class: Chronic  . Diabetes mellitus type 2, uncomplicated ([place]) 46/27/0350    Priority: Medium    Class: Chronic  . Lumbosacral radiculopathy at L3   . Tachycardia 12/14/2017  . Leukocytosis 12/14/2017  . Syncope 12/03/2017  . Dehydration 12/03/2017  . Chronic back pain 12/03/2017  . Hypotension 12/03/2017  . Subclavian artery stenosis, right ([place]) 12/03/2017  . Status post lumbar laminectomy 11/12/2017  .  Postoperative pain after spinal surgery 11/11/2017  . Weakness of right lower extremity   . Sciatica associated with disorder of lumbosacral spine   . Surgery, elective   . Hip pain, acute, right 10/09/2017  . Bradycardia 10/09/2017  . Spondylolisthesis, lumbar region 10/05/2017  . Knee contusion 07/24/2016  . Stenosis of cervical spine with myelopathy ([place]) 05/14/2015   Past Medical History:  Diagnosis Date  . Anginal pain ([place])    occ  . Arthritis    "joints" (10/09/2017)  . Chronic lower back pain   . Complication of anesthesia    extremely claustrophobic; "have to be put down for MRI; don't sit in back seat of car;, etc."  . Coronary artery disease   . High cholesterol    "get shots twice/month" (10/09/2017)  . History of blood transfusion 2001   "related to CABG"  .  History of kidney stones   . HOH (hard of hearing)    wearing hearing aids  . Hypertension   . Myocardial infarction ([place]) 2000s-06/2017 X 4  . Stroke [facility]) ~ 2015   "in my right eye; sight is coming back little by little" (10/09/2017)  . Type II diabetes mellitus (H[facility])     Family History  Problem Relation Age of Onset  . CAD Brother   . Diabetes Neg Hx     Past Surgical History:  Procedure Laterality Date  . ANTERIOR CERVICAL DECOMP/DISCECTOMY FUSION N/A 05/14/2015   Procedure: C3-4  ANTERIOR CERVICAL DISCECTOMY AND FUSION WITH PLATE AND SCREWS, LOCAL AND ALLOGRAFT BONE GRAFT;  Surgeon: Jessy Oto, MD;  Location: [place];  Service: Orthopedics;  Laterality: N/A;  . BACK SURGERY    . CARDIAC CATHETERIZATION     X 17 (10/09/2017)  . CATARACT EXTRACTION W/ INTRAOCULAR LENS  IMPLANT, BILATERAL Bilateral   . COLONOSCOPY    . CORONARY ANGIOPLASTY  06/2017  . CORONARY ANGIOPLASTY WITH STENT PLACEMENT     "6 stents total" (10/09/2017)  . CORONARY ARTERY BYPASS GRAFT  2001   CABG X4  . CYSTOSCOPY W/ STONE MANIPULATION    . JOINT REPLACEMENT    . LUMBAR LAMINECTOMY Right 10/13/2017   Procedure: MICRODISCECTOMY  LUMBAR LAMINECTOMY RIGHT L3-4;  Surgeon: Jessy Oto, MD;  Location: [place];  Service: Orthopedics;  Laterality: Right;  . LUMBAR LAMINECTOMY Left 11/12/2017   Procedure: MICRODISCECTOMY L3-4 FOR RECURRENT HNP POSSIBLE FLURO;  Surgeon: Jessy Oto, MD;  Location: [place];  Service: Orthopedics;  Laterality: Left;  . LUMBAR LAMINECTOMY/DECOMPRESSION MICRODISCECTOMY N/A 01/16/2014   Procedure: Left L3-4 and L4-5 foraminotomies;  Surgeon: Jessy Oto, MD;  Location: [place];  Service: Orthopedics;  Laterality: N/A;  . NASAL FRACTURE SURGERY    . NASAL SINUS SURGERY     "several times since 1959"  . RADIOLOGY WITH ANESTHESIA N/A 10/12/2017   Procedure: MRI WITH ANESTHESIA;  Surgeon: Radiologist, Medication, MD;  Location: [place];  Service: Radiology;  Laterality: N/A;  . RADIOLOGY WITH ANESTHESIA N/A 12/05/2017   Procedure: MRI WITH ANESTHESIA;  Surgeon: Radiologist, Medication, MD;  Location: [place];  Service: Radiology;  Laterality: N/A;  . SHOULDER OPEN ROTATOR CUFF REPAIR Bilateral   . TOTAL KNEE ARTHROPLASTY Right 2012  . TYMPANOSTOMY TUBE PLACEMENT Bilateral   . ULNAR NERVE TRANSPOSITION Left 05/14/2015   Procedure: LEFT ULNAR NERVE DECOMPRESSION AT THE ELBOW;  Surgeon: Jessy Oto, MD;  Location: [place];  Service: Orthopedics;  Laterality: Left;   Social History   Occupational History  . Not on file  Tobacco Use  . Smoking status: Former Smoker    Packs/day: 1.00    Years: 2.00    Pack years: 2.00    Last attempt to quit: 01/09/1957    Years since quitting: 61.0  . Smokeless tobacco: Never Used  Substance and Sexual Activity  . Alcohol use: No  . Drug use: No  . Sexual activity: Not Currently

## 2018-01-23 NOTE — Patient Instructions (Signed)
Plan:Avoid frequent bending and stooping  No lifting greater than 10 lbs. May use ice or moist heat for pain. Weight loss is of benefit. Handicap license is approved. PSA, Sed rate, Vit D Please obtain a copy of the CT Scan done at [facility] 12/26/2017

## 2018-01-24 LAB — SEDIMENTATION RATE: Sed Rate: 67 mm/h — ABNORMAL HIGH (ref 0–20)

## 2018-01-24 LAB — VITAMIN D 25 HYDROXY (VIT D DEFICIENCY, FRACTURES): Vit D, 25-Hydroxy: 31 ng/mL (ref 30–100)

## 2018-01-24 LAB — PSA: PSA: 0.9 ng/mL (ref <=4.0)

## 2018-01-25 ENCOUNTER — Telehealth (INDEPENDENT_AMBULATORY_CARE_PROVIDER_SITE_OTHER): Payer: Self-pay | Admitting: Specialist

## 2018-01-25 ENCOUNTER — Other Ambulatory Visit (INDEPENDENT_AMBULATORY_CARE_PROVIDER_SITE_OTHER): Payer: Self-pay | Admitting: Specialist

## 2018-01-25 MED ORDER — GABAPENTIN 300 MG PO CAPS: 300.0000 mg | ORAL_CAPSULE | Freq: Two times a day (BID) | ORAL | 0 refills | Status: DC

## 2018-01-25 NOTE — Telephone Encounter (Signed)
Patient wife Lupita Leash called stated patient is out of GABATENTIN.  Has taken his last one.  Please call wife Lupita Leash to advise.  [phone]

## 2018-01-25 NOTE — Telephone Encounter (Signed)
I called and spoke with Lupita Leash, Dr. Otelia Sergeant rewrote rx and the gabapentin and sent it to their pharm.

## 2018-02-06 ENCOUNTER — Encounter (INDEPENDENT_AMBULATORY_CARE_PROVIDER_SITE_OTHER): Payer: Self-pay | Admitting: Specialist

## 2018-02-06 ENCOUNTER — Ambulatory Visit (INDEPENDENT_AMBULATORY_CARE_PROVIDER_SITE_OTHER): Payer: Medicare HMO | Admitting: Specialist

## 2018-02-06 VITALS — BP 147/68 | HR 63 | Ht 74.0 in | Wt 198.0 lb

## 2018-02-06 DIAGNOSIS — M4326 Fusion of spine, lumbar region: Secondary | ICD-10-CM

## 2018-02-06 DIAGNOSIS — E559 Vitamin D deficiency, unspecified: Secondary | ICD-10-CM

## 2018-02-06 DIAGNOSIS — S32050S Wedge compression fracture of fifth lumbar vertebra, sequela: Secondary | ICD-10-CM

## 2018-02-06 MED ORDER — TAPENTADOL HCL ER 50 MG PO TB12: 50.0000 mg | ORAL_TABLET | Freq: Two times a day (BID) | ORAL | 0 refills | Status: AC

## 2018-02-06 MED ORDER — GABAPENTIN 300 MG PO CAPS: 300.0000 mg | ORAL_CAPSULE | Freq: Three times a day (TID) | ORAL | 0 refills | Status: DC

## 2018-02-06 NOTE — Progress Notes (Signed)
Post-Op Visit Note   Patient: Jeffrey Nelson           Date of Birth: 05-Nov-1934           MRN: [id number] Visit Date: 02/06/2018 PCP: Wayland Salinas, MD   Assessment & Plan:2 months post L3-4 and L4-5 TLIFs for foramenal stenosis left L3 and L4 nerves.  Chief Complaint:  Chief Complaint  Patient presents with  . Lower Back - Pain, Routine Post Op  He is still having pain in the left anterior thigh. Taking gabapentin 300 mg BID He is not on meds right now, ran out of the nucyenta 4 days ago.   Visit Diagnoses:  1. Fusion of lumbar spine   2. Closed compression fracture of fifth lumbar vertebra, sequela   3. Vitamin D deficiency     Plan: Avoid frequent bending and stooping  No lifting greater than 10 lbs. May use ice or moist heat for pain. Nucyenta for pain Handicap license is approved. Blood work to evaluate for any sign of multiple myeloma Serum protein electrophoresis.  Follow-Up Instructions: Return in about 1 month (around 03/06/2018).   Orders:  Orders Placed This Encounter  Procedures  . Protein Electrophoresis, (serum)   Meds ordered this encounter  Medications  . gabapentin (NEURONTIN) 300 MG capsule    Sig: Take 1 capsule (300 mg total) by mouth 3 (three) times daily.    Dispense:  90 capsule    Refill:  0  . tapentadol (NUCYNTA ER) 50 MG 12 hr tablet    Sig: Take 1 tablet (50 mg total) by mouth every 12 (twelve) hours for 7 days.    Dispense:  40 tablet    Refill:  0    Imaging: No results found.  PMFS History: Patient Active Problem List   Diagnosis Date Noted  . Anemia due to blood loss 12/13/2017    Priority: High    Class: Chronic  . Lumbar disc herniation with radiculopathy 10/13/2017    Priority: High    Class: Acute  . Radiculopathy, lumbar region 10/09/2017    Priority: High  . Pain in right hip 10/09/2017    Priority: High    Class: Acute  . Right knee sprain 10/09/2017    Priority: High    Class: Acute  . Spinal  stenosis in cervical region 05/14/2015    Priority: High    Class: Chronic  . Cubital tunnel syndrome on left 05/14/2015    Priority: High    Class: Chronic  . Spinal stenosis, lumbar region, with neurogenic claudication 01/16/2014    Priority: High    Class: Chronic  . Diabetes mellitus type 2, uncomplicated ([place]) 95/62/1308    Priority: Medium    Class: Chronic  . Lumbosacral radiculopathy at L3   . Tachycardia 12/14/2017  . Leukocytosis 12/14/2017  . Syncope 12/03/2017  . Dehydration 12/03/2017  . Chronic back pain 12/03/2017  . Hypotension 12/03/2017  . Subclavian artery stenosis, right ([place]) 12/03/2017  . Status post lumbar laminectomy 11/12/2017  . Postoperative pain after spinal surgery 11/11/2017  . Weakness of right lower extremity   . Sciatica associated with disorder of lumbosacral spine   . Surgery, elective   . Hip pain, acute, right 10/09/2017  . Bradycardia 10/09/2017  . Spondylolisthesis, lumbar region 10/05/2017  . Knee contusion 07/24/2016  . Stenosis of cervical spine with myelopathy ([place]) 05/14/2015   Past Medical History:  Diagnosis Date  . Anginal pain ([place])    occ  .  Arthritis    "joints" (10/09/2017)  . Chronic lower back pain   . Complication of anesthesia    extremely claustrophobic; "have to be put down for MRI; don't sit in back seat of car;, etc."  . Coronary artery disease   . High cholesterol    "get shots twice/month" (10/09/2017)  . History of blood transfusion 2001   "related to CABG"  . History of kidney stones   . HOH (hard of hearing)    wearing hearing aids  . Hypertension   . Myocardial infarction ([place]) 2000s-06/2017 X 4  . Stroke [facility]) ~ 2015   "in my right eye; sight is coming back little by little" (10/09/2017)  . Type II diabetes mellitus (HCC)     Family History  Problem Relation Age of Onset  . CAD Brother   . Diabetes Neg Hx     Past Surgical History:  Procedure Laterality Date  . ANTERIOR CERVICAL  DECOMP/DISCECTOMY FUSION N/A 05/14/2015   Procedure: C3-4  ANTERIOR CERVICAL DISCECTOMY AND FUSION WITH PLATE AND SCREWS, LOCAL AND ALLOGRAFT BONE GRAFT;  Surgeon: Jessy Oto, MD;  Location: [place];  Service: Orthopedics;  Laterality: N/A;  . BACK SURGERY    . CARDIAC CATHETERIZATION     X 17 (10/09/2017)  . CATARACT EXTRACTION W/ INTRAOCULAR LENS  IMPLANT, BILATERAL Bilateral   . COLONOSCOPY    . CORONARY ANGIOPLASTY  06/2017  . CORONARY ANGIOPLASTY WITH STENT PLACEMENT     "6 stents total" (10/09/2017)  . CORONARY ARTERY BYPASS GRAFT  2001   CABG X4  . CYSTOSCOPY W/ STONE MANIPULATION    . JOINT REPLACEMENT    . LUMBAR LAMINECTOMY Right 10/13/2017   Procedure: MICRODISCECTOMY LUMBAR LAMINECTOMY RIGHT L3-4;  Surgeon: Jessy Oto, MD;  Location: [place];  Service: Orthopedics;  Laterality: Right;  . LUMBAR LAMINECTOMY Left 11/12/2017   Procedure: MICRODISCECTOMY L3-4 FOR RECURRENT HNP POSSIBLE FLURO;  Surgeon: Jessy Oto, MD;  Location: [place];  Service: Orthopedics;  Laterality: Left;  . LUMBAR LAMINECTOMY/DECOMPRESSION MICRODISCECTOMY N/A 01/16/2014   Procedure: Left L3-4 and L4-5 foraminotomies;  Surgeon: Jessy Oto, MD;  Location: [place];  Service: Orthopedics;  Laterality: N/A;  . NASAL FRACTURE SURGERY    . NASAL SINUS SURGERY     "several times since 1959"  . RADIOLOGY WITH ANESTHESIA N/A 10/12/2017   Procedure: MRI WITH ANESTHESIA;  Surgeon: Radiologist, Medication, MD;  Location: [place];  Service: Radiology;  Laterality: N/A;  . RADIOLOGY WITH ANESTHESIA N/A 12/05/2017   Procedure: MRI WITH ANESTHESIA;  Surgeon: Radiologist, Medication, MD;  Location: [place];  Service: Radiology;  Laterality: N/A;  . SHOULDER OPEN ROTATOR CUFF REPAIR Bilateral   . TOTAL KNEE ARTHROPLASTY Right 2012  . TYMPANOSTOMY TUBE PLACEMENT Bilateral   . ULNAR NERVE TRANSPOSITION Left 05/14/2015   Procedure: LEFT ULNAR NERVE DECOMPRESSION AT THE ELBOW;  Surgeon: Jessy Oto, MD;  Location: [place];  Service:  Orthopedics;  Laterality: Left;   Social History   Occupational History  . Not on file  Tobacco Use  . Smoking status: Former Smoker    Packs/day: 1.00    Years: 2.00    Pack years: 2.00    Last attempt to quit: 01/09/1957    Years since quitting: 61.1  . Smokeless tobacco: Never Used  Substance and Sexual Activity  . Alcohol use: No  . Drug use: No  . Sexual activity: Not Currently

## 2018-02-06 NOTE — Patient Instructions (Signed)
Avoid frequent bending and stooping  No lifting greater than 10 lbs. May use ice or moist heat for pain. Nucyenta for pain Handicap license is approved. Blood work to evaluate for any sign of multiple myeloma Serum protein electrophoresis.

## 2018-02-08 LAB — PROTEIN ELECTROPHORESIS, SERUM
ALPHA 1: 0.2 g/dL (ref 0.2–0.3)
Albumin ELP: 3.8 g/dL (ref 3.8–4.8)
Alpha 2: 0.8 g/dL (ref 0.5–0.9)
BETA 2: 0.4 g/dL (ref 0.2–0.5)
BETA GLOBULIN: 0.4 g/dL (ref 0.4–0.6)
GAMMA GLOBULIN: 1.3 g/dL (ref 0.8–1.7)
Total Protein: 7.1 g/dL (ref 6.1–8.1)

## 2018-02-12 ENCOUNTER — Ambulatory Visit (INDEPENDENT_AMBULATORY_CARE_PROVIDER_SITE_OTHER): Payer: Medicare HMO | Admitting: Physical Medicine and Rehabilitation

## 2018-02-12 ENCOUNTER — Ambulatory Visit (INDEPENDENT_AMBULATORY_CARE_PROVIDER_SITE_OTHER): Payer: Medicare HMO

## 2018-02-12 ENCOUNTER — Encounter (INDEPENDENT_AMBULATORY_CARE_PROVIDER_SITE_OTHER): Payer: Self-pay | Admitting: Physical Medicine and Rehabilitation

## 2018-02-12 VITALS — BP 124/67 | HR 74

## 2018-02-12 DIAGNOSIS — M5416 Radiculopathy, lumbar region: Secondary | ICD-10-CM | POA: Diagnosis not present

## 2018-02-12 MED ORDER — BETAMETHASONE SOD PHOS & ACET 6 (3-3) MG/ML IJ SUSP
12.0000 mg | Freq: Once | INTRAMUSCULAR | Status: AC
Start: 2018-02-12 — End: 2018-02-12
  Administered 2018-02-12: 12 mg

## 2018-02-12 NOTE — Procedures (Signed)
Lumbosacral Transforaminal Epidural Steroid Injection - Sub-Pedicular Approach with Fluoroscopic Guidance  Patient: Jeffrey Nelson      Date of Birth: 05-02-1935 MRN: [id number] PCP: Deloris Pingyter-Brown, Sherry M, MD      Visit Date: 02/12/2018   Universal Protocol:    Date/Time: 02/12/2018  Consent Given By: the patient  Position: PRONE  Additional Comments: Vital signs were monitored before and after the procedure. Patient was prepped and draped in the usual sterile fashion. The correct patient, procedure, and site was verified.   Injection Procedure Details:  Procedure Site One Meds Administered:  Meds ordered this encounter  Medications  . betamethasone acetate-betamethasone sodium phosphate (CELESTONE) injection 12 mg    Laterality: Left  Location/Site: Careful numbering needs to be utilized with this patient.  Images today have utilized the lowest vertebral body is L5 and counted upward. L2-L3 L3-L4  Needle size: 22 G  Needle type: Spinal  Needle Placement: Transforaminal  Findings:    -Comments: Excellent flow of contrast along the nerve and into the epidural space.  Procedure Details: After squaring off the end-plates to get a true AP view, the C-arm was positioned so that an oblique view of the foramen as noted above was visualized. The target area is just inferior to the "nose of the scotty dog" or sub pedicular. The soft tissues overlying this structure were infiltrated with 2-3 ml. of 1% Lidocaine without Epinephrine.  The spinal needle was inserted toward the target using a "trajectory" view along the fluoroscope beam.  Under AP and lateral visualization, the needle was advanced so it did not puncture dura and was located close the 6 O'Clock position of the pedical in AP tracterory. Biplanar projections were used to confirm position. Aspiration was confirmed to be negative for CSF and/or blood. A 1-2 ml. volume of Isovue-250 was injected and flow of contrast was  noted at each level. Radiographs were obtained for documentation purposes.   After attaining the desired flow of contrast documented above, a 0.5 to 1.0 ml test dose of 0.25% Marcaine was injected into each respective transforaminal space.  The patient was observed for 90 seconds post injection.  After no sensory deficits were reported, and normal lower extremity motor function was noted,   the above injectate was administered so that equal amounts of the injectate were placed at each foramen (level) into the transforaminal epidural space.   Additional Comments:  The patient tolerated the procedure well Dressing: Band-Aid    Post-procedure details: Patient was observed during the procedure. Post-procedure instructions were reviewed.  Patient left the clinic in stable condition.

## 2018-02-12 NOTE — Patient Instructions (Signed)

## 2018-02-12 NOTE — Progress Notes (Signed)
REGNALD BOWENS - 83 y.o. male MRN [id number]  Date of birth: 18-Mar-1935  Office Visit Note: Visit Date: 02/12/2018 PCP: Deloris Ping, MD Referred by: Deloris Ping, *  Subjective: Chief Complaint  Patient presents with  . Lower Back - Pain  . Left Leg - Pain   HPI: Mr. Delis is a very pleasant 83 year old gentleman who is now status post lumbar instrumented fusion at L3-4 and L4-5.  He is having more L3 and slightly L4 symptoms on the left.  Dr. Otelia Sergeant requested L3 or L4 transforaminal injection.  They were to referral orders placed.  Patient has transitional segment with essentially 6 vertebral bodies in the lumbar spine.  Imaging is designated the lowest vertebral body is L5.  Looking at the images of fluoroscopy we completed an L2 and L3 injection based on that numbering scheme.  Fortunately the L3 physician did give him concordant pain response.   ROS Otherwise per HPI.  Assessment & Plan: Visit Diagnoses:  1. Lumbar radiculopathy     Plan: No additional findings.   Meds & Orders:  Meds ordered this encounter  Medications  . betamethasone acetate-betamethasone sodium phosphate (CELESTONE) injection 12 mg    Orders Placed This Encounter  Procedures  . XR C-ARM NO REPORT  . Epidural Steroid injection    Follow-up: Return for Dr. Otelia Sergeant.   Procedures: No procedures performed  Lumbosacral Transforaminal Epidural Steroid Injection - Sub-Pedicular Approach with Fluoroscopic Guidance  Patient: Jeffrey Nelson      Date of Birth: [birth year] MRN: [id number] PCP: Deloris Ping, MD      Visit Date: 02/12/2018   Universal Protocol:    Date/Time: 02/12/2018  Consent Given By: the patient  Position: PRONE  Additional Comments: Vital signs were monitored before and after the procedure. Patient was prepped and draped in the usual sterile fashion. The correct patient, procedure, and site was verified.   Injection Procedure Details:  Procedure  Site One Meds Administered:  Meds ordered this encounter  Medications  . betamethasone acetate-betamethasone sodium phosphate (CELESTONE) injection 12 mg    Laterality: Left  Location/Site: Careful numbering needs to be utilized with this patient.  Images today have utilized the lowest vertebral body is L5 and counted upward. L2-L3 L3-L4  Needle size: 22 G  Needle type: Spinal  Needle Placement: Transforaminal  Findings:    -Comments: Excellent flow of contrast along the nerve and into the epidural space.  Procedure Details: After squaring off the end-plates to get a true AP view, the C-arm was positioned so that an oblique view of the foramen as noted above was visualized. The target area is just inferior to the "nose of the scotty dog" or sub pedicular. The soft tissues overlying this structure were infiltrated with 2-3 ml. of 1% Lidocaine without Epinephrine.  The spinal needle was inserted toward the target using a "trajectory" view along the fluoroscope beam.  Under AP and lateral visualization, the needle was advanced so it did not puncture dura and was located close the 6 O'Clock position of the pedical in AP tracterory. Biplanar projections were used to confirm position. Aspiration was confirmed to be negative for CSF and/or blood. A 1-2 ml. volume of Isovue-250 was injected and flow of contrast was noted at each level. Radiographs were obtained for documentation purposes.   After attaining the desired flow of contrast documented above, a 0.5 to 1.0 ml test dose of 0.25% Marcaine was injected into each respective transforaminal space.  The  patient was observed for 90 seconds post injection.  After no sensory deficits were reported, and normal lower extremity motor function was noted,   the above injectate was administered so that equal amounts of the injectate were placed at each foramen (level) into the transforaminal epidural space.   Additional Comments:  The patient  tolerated the procedure well Dressing: Band-Aid    Post-procedure details: Patient was observed during the procedure. Post-procedure instructions were reviewed.  Patient left the clinic in stable condition.    Clinical History: MRI LUMBAR SPINE WITHOUT AND WITH CONTRAST  TECHNIQUE: Multiplanar and multiecho pulse sequences of the lumbar spine were obtained without and with intravenous contrast.  CONTRAST:  19mL MULTIHANCE GADOBENATE DIMEGLUMINE 529 MG/ML IV SOLN  COMPARISON:  MRI lumbar spine November 11, 2017  FINDINGS: SEGMENTATION: For the purposes of this report, the last well-formed intervertebral disc is reported as L5-S1 consistent with prior imaging.  ALIGNMENT: Maintained lumbar lordosis. No malalignment.  VERTEBRAE:Status post redo LEFT L3-4 and LEFT L4-5 laminectomies. Status post recent RIGHT L3-4 hemilaminectomy. Vertebral bodies intact. L3-4 disc edema in severe enhancing discogenic endplate changes. No associated abnormal disc enhancement. Stable moderate to severe L4-5 disc height loss with mild multilevel disc desiccation. Moderate L4-5 subacute discogenic endplate changes. Multilevel mild chronic discogenic endplate changes with trace discogenic endplate enhancement.  CONUS MEDULLARIS AND CAUDA EQUINA: Conus medullaris terminates at T12-L1 and demonstrates normal morphology and signal characteristics. Anteriorly displaced cauda equina at L5-S1 junction due to new lentiform 0.9 x 2.2 x 4.9 cm (AP by transverse by CC) posterior fluid collection with dependent T1 shortening and fluid fluid levels, no enhancement. It is unclear if this fluid collection is intrathecal or extradural. Cauda equina is otherwise unremarkable without nerve root clumping or abnormal enhancement.  PARASPINAL AND OTHER SOFT TISSUES: RIGHT greater than LEFT paraspinal muscle denervation at and below the level surgical and intervention. Irregular 3 x 1.3 cm fluid collection  within the LEFT L3 surgical bed with faint marginal enhancement most compatible with seroma.  DISC LEVELS:  L1-2: Annular bulging. No canal stenosis. Mild RIGHT neural foraminal narrowing.  L2-3: Similar moderate broad-based disc bulge. Mild facet arthropathy and ligamentum flavum redundancy. Minimal canal stenosis. Moderate RIGHT, mild LEFT neural foraminal narrowing.  L3-4: Bilateral laminectomies. Similar 6 x 19 mm fluid collection within RIGHT laminectomy site extending to discectomy margin with thick rim enhancement effacing the RIGHT lateral recess. Enhancement extends into the RIGHT facet with slight RIGHT facet widening new from prior study. Granulation tissue encasing the traversing LEFT L4 nerve. No canal stenosis though there is slight medial displacement of the traversing RIGHT L4 nerve. Severe bilateral neural foraminal narrowing. Extensive annular enhancement consistent with discectomies without residual or recurrent disc protrusion.  L4-5: Interval apparent redo LEFT hemilaminectomy hemilaminectomy. Small broad-based disc bulge asymmetric to the LEFT similar to prior examination with enhancing annular fissures. Mild RIGHT facet arthropathy. No canal stenosis. Mild RIGHT, moderate to severe LEFT neural foraminal narrowing. Granulation tissue LEFT lateral recess encasing the traversing LEFT L5 nerve.  L5-S1: Transitional anatomy with pseudoarthrosis. No disc bulge, canal stenosis nor neural foraminal narrowing.  IMPRESSION: 1. Status post interval LEFT L3-4 and suspected L4-5 redo laminectomies. Exuberant discogenic endplate changes L3-4, less likely discitis osteomyelitis. 2. Recent RIGHT L3-4 hemilaminectomy. Stable 6 x 9 mm fluid collection within RIGHT surgical bed equivocal for abscess, however there is new enhancing RIGHT L3-4 facet effusion and mild widening may be infectious or inflammatory. 3. New 0.9 x 2.2 x 4.9 cm  subacute hematoma at L5-S1  anteriorly displacing the cauda equina. 4. No canal stenosis. L2-3 through L4-5 neural foraminal narrowing: Severe at L3-4.   Electronically Signed   By: Awilda Metro M.D.   On: 12/05/2017 14:22   He reports that he quit smoking about 61 years ago. He has a 2.00 pack-year smoking history. He has never used smokeless tobacco.  Recent Labs    10/09/17 1226 12/04/17 0342  HGBA1C 6.6* 6.8*    Objective:  VS:  HT:    WT:   BMI:     BP:124/67  HR:74bpm  TEMP: ( )  RESP:  Physical Exam  Ortho Exam Imaging: Xr C-arm No Report  Result Date: 02/12/2018 Please see Notes tab for imaging impression.   Past Medical/Family/Surgical/Social History: Medications & Allergies reviewed per EMR, new medications updated. Patient Active Problem List   Diagnosis Date Noted  . Lumbosacral radiculopathy at L3   . Tachycardia 12/14/2017  . Leukocytosis 12/14/2017  . Anemia due to blood loss 12/13/2017    Class: Chronic  . Syncope 12/03/2017  . Dehydration 12/03/2017  . Chronic back pain 12/03/2017  . Hypotension 12/03/2017  . Subclavian artery stenosis, right (HCC) 12/03/2017  . Status post lumbar laminectomy 11/12/2017  . Postoperative pain after spinal surgery 11/11/2017  . Weakness of right lower extremity   . Sciatica associated with disorder of lumbosacral spine   . Surgery, elective   . Diabetes mellitus type 2, uncomplicated (HCC) 10/14/2017    Class: Chronic  . Lumbar disc herniation with radiculopathy 10/13/2017    Class: Acute  . Radiculopathy, lumbar region 10/09/2017  . Pain in right hip 10/09/2017    Class: Acute  . Right knee sprain 10/09/2017    Class: Acute  . Hip pain, acute, right 10/09/2017  . Bradycardia 10/09/2017  . Spondylolisthesis, lumbar region 10/05/2017  . Knee contusion 07/24/2016  . Spinal stenosis in cervical region 05/14/2015    Class: Chronic  . Cubital tunnel syndrome on left 05/14/2015    Class: Chronic  . Stenosis of cervical spine  with myelopathy (HCC) 05/14/2015  . Spinal stenosis, lumbar region, with neurogenic claudication 01/16/2014    Class: Chronic   Past Medical History:  Diagnosis Date  . Anginal pain (HCC)    occ  . Arthritis    "joints" (10/09/2017)  . Chronic lower back pain   . Complication of anesthesia    extremely claustrophobic; "have to be put down for MRI; don't sit in back seat of car;, etc."  . Coronary artery disease   . High cholesterol    "get shots twice/month" (10/09/2017)  . History of blood transfusion 2001   "related to CABG"  . History of kidney stones   . HOH (hard of hearing)    wearing hearing aids  . Hypertension   . Myocardial infarction (HCC) 2000s-06/2017 X 4  . Stroke [facility]) ~ 2015   "in my right eye; sight is coming back little by little" (10/09/2017)  . Type II diabetes mellitus (HCC)    Family History  Problem Relation Age of Onset  . CAD Brother   . Diabetes Neg Hx    Past Surgical History:  Procedure Laterality Date  . ANTERIOR CERVICAL DECOMP/DISCECTOMY FUSION N/A 05/14/2015   Procedure: C3-4  ANTERIOR CERVICAL DISCECTOMY AND FUSION WITH PLATE AND SCREWS, LOCAL AND ALLOGRAFT BONE GRAFT;  Surgeon: Kerrin Champagne, MD;  Location: [facility] OR;  Service: Orthopedics;  Laterality: N/A;  . BACK SURGERY    . CARDIAC  CATHETERIZATION     X 17 (10/09/2017)  . CATARACT EXTRACTION W/ INTRAOCULAR LENS  IMPLANT, BILATERAL Bilateral   . COLONOSCOPY    . CORONARY ANGIOPLASTY  06/2017  . CORONARY ANGIOPLASTY WITH STENT PLACEMENT     "6 stents total" (10/09/2017)  . CORONARY ARTERY BYPASS GRAFT  2001   CABG X4  . CYSTOSCOPY W/ STONE MANIPULATION    . JOINT REPLACEMENT    . LUMBAR LAMINECTOMY Right 10/13/2017   Procedure: MICRODISCECTOMY LUMBAR LAMINECTOMY RIGHT L3-4;  Surgeon: Kerrin ChampagneNitka, James E, MD;  [facility];  Service: Orthopedics;  Laterality: Right;  . LUMBAR LAMINECTOMY Left 11/12/2017   Procedure: MICRODISCECTOMY L3-4 FOR RECURRENT HNP POSSIBLE FLURO;  Surgeon: Kerrin ChampagneNitka, James  E, MD;  [facility];  Service: Orthopedics;  Laterality: Left;  . LUMBAR LAMINECTOMY/DECOMPRESSION MICRODISCECTOMY N/A 01/16/2014   Procedure: Left L3-4 and L4-5 foraminotomies;  Surgeon: Kerrin ChampagneJames E Nitka, MD;  [facility];  Service: Orthopedics;  Laterality: N/A;  . NASAL FRACTURE SURGERY    . NASAL SINUS SURGERY     "several times since 1959"  . RADIOLOGY WITH ANESTHESIA N/A 10/12/2017   Procedure: MRI WITH ANESTHESIA;  Surgeon: Radiologist, Medication, MD;  Location: [facility] OR;  Service: Radiology;  Laterality: N/A;  . RADIOLOGY WITH ANESTHESIA N/A 12/05/2017   Procedure: MRI WITH ANESTHESIA;  Surgeon: Radiologist, Medication, MD;  Location: [facility] OR;  Service: Radiology;  Laterality: N/A;  . SHOULDER OPEN ROTATOR CUFF REPAIR Bilateral   . TOTAL KNEE ARTHROPLASTY Right 2012  . TYMPANOSTOMY TUBE PLACEMENT Bilateral   . ULNAR NERVE TRANSPOSITION Left 05/14/2015   Procedure: LEFT ULNAR NERVE DECOMPRESSION AT THE ELBOW;  Surgeon: Kerrin ChampagneJames E Nitka, MD;  [facility];  Service: Orthopedics;  Laterality: Left;   Social History   Occupational History  . Not on file  Tobacco Use  . Smoking status: Former Smoker    Packs/day: 1.00    Years: 2.00    Pack years: 2.00    Last attempt to quit: 01/09/1957    Years since quitting: 61.1  . Smokeless tobacco: Never Used  Substance and Sexual Activity  . Alcohol use: No  . Drug use: No  . Sexual activity: Not Currently

## 2018-02-12 NOTE — Progress Notes (Signed)
 .  Numeric Pain Rating Scale and Functional Assessment Average Pain 3   In the last MONTH (on 0-10 scale) has pain interfered with the following?  1. General activity like being  able to carry out your everyday physical activities such as walking, climbing stairs, carrying groceries, or moving a chair?  Rating(2)   +Driver, -BT, -Dye Allergies.  

## 2018-02-14 ENCOUNTER — Other Ambulatory Visit (INDEPENDENT_AMBULATORY_CARE_PROVIDER_SITE_OTHER): Payer: Self-pay | Admitting: Specialist

## 2018-02-15 NOTE — Telephone Encounter (Signed)
Gabapentin Refill Request 

## 2018-02-20 ENCOUNTER — Telehealth (INDEPENDENT_AMBULATORY_CARE_PROVIDER_SITE_OTHER): Payer: Self-pay | Admitting: Specialist

## 2018-02-20 NOTE — Telephone Encounter (Signed)
Patient's wife Lupita Leash(Donna) called advised Dr Otelia SergeantNitka was suppose to call her concerning the lab work that was done 2 weeks ago yesterday. The number to contact Lupita LeashDonna is [phone]

## 2018-02-21 NOTE — Telephone Encounter (Signed)
Patient's wife Lupita Leash(Donna) called advised Dr Otelia SergeantNitka was suppose to call her concerning the lab work that was done 2 weeks ago yesterday

## 2018-02-27 ENCOUNTER — Telehealth (INDEPENDENT_AMBULATORY_CARE_PROVIDER_SITE_OTHER): Payer: Self-pay | Admitting: Radiology

## 2018-02-27 NOTE — Telephone Encounter (Signed)
Jeffrey Nelson, pts daughter called, wanting results of blood test, I spoke with Dr Otelia SergeantNitka he looked at the results and advised that I could call them and let them know the results were normal.  I called and spoke with Jeffrey Nelson, pts wife, and advised her the results were normal.

## 2018-03-07 ENCOUNTER — Encounter (INDEPENDENT_AMBULATORY_CARE_PROVIDER_SITE_OTHER): Payer: Self-pay | Admitting: Specialist

## 2018-03-07 ENCOUNTER — Ambulatory Visit (INDEPENDENT_AMBULATORY_CARE_PROVIDER_SITE_OTHER): Payer: Medicare HMO | Admitting: Specialist

## 2018-03-07 ENCOUNTER — Ambulatory Visit (INDEPENDENT_AMBULATORY_CARE_PROVIDER_SITE_OTHER): Payer: Medicare HMO

## 2018-03-07 VITALS — BP 127/69 | HR 71 | Ht 74.0 in | Wt 198.0 lb

## 2018-03-07 DIAGNOSIS — M4326 Fusion of spine, lumbar region: Secondary | ICD-10-CM | POA: Diagnosis not present

## 2018-03-07 DIAGNOSIS — E559 Vitamin D deficiency, unspecified: Secondary | ICD-10-CM

## 2018-03-07 MED ORDER — VITAMIN D (ERGOCALCIFEROL) 1.25 MG (50000 UNIT) PO CAPS: 50000.0000 [IU] | ORAL_CAPSULE | ORAL | 2 refills | Status: AC

## 2018-03-07 NOTE — Progress Notes (Signed)
Post-Op Visit Note   Patient: Jeffrey Nelson           Date of Birth: [birth year]           MRN: [id number] Visit Date: 03/07/2018 PCP: Deloris Ping, MD   Assessment & Plan: 3 months post L3 to L5 fusion, delay in healing, mild compression deformity At the proximal junction.  Chief Complaint:  Chief Complaint  Patient presents with  . Lower Back - Routine Post Op   Visit Diagnoses:  1. Fusion of lumbar spine     Plan: Avoid frequent bending and stooping  No lifting greater than 10 lbs. May use ice or moist heat for pain. Weight loss is of benefit. Handicap license is approved. Continue with the use of the brace when out of bed. Continue with vitamin D 50,000 IU per week for 2 months then 2,000 IU per day.  Calcium 500mg   Twice a day  Follow-Up Instructions: No follow-ups on file.   Orders:  Orders Placed This Encounter  Procedures  . XR Lumbar Spine 2-3 Views   No orders of the defined types were placed in this encounter.   Imaging: No results found.  PMFS History: Patient Active Problem List   Diagnosis Date Noted  . Anemia due to blood loss 12/13/2017    Priority: High    Class: Chronic  . Lumbar disc herniation with radiculopathy 10/13/2017    Priority: High    Class: Acute  . Radiculopathy, lumbar region 10/09/2017    Priority: High  . Pain in right hip 10/09/2017    Priority: High    Class: Acute  . Right knee sprain 10/09/2017    Priority: High    Class: Acute  . Spinal stenosis in cervical region 05/14/2015    Priority: High    Class: Chronic  . Cubital tunnel syndrome on left 05/14/2015    Priority: High    Class: Chronic  . Spinal stenosis, lumbar region, with neurogenic claudication 01/16/2014    Priority: High    Class: Chronic  . Diabetes mellitus type 2, uncomplicated (HCC) 10/14/2017    Priority: Medium    Class: Chronic  . Lumbosacral radiculopathy at L3   . Tachycardia 12/14/2017  . Leukocytosis 12/14/2017  .  Syncope 12/03/2017  . Dehydration 12/03/2017  . Chronic back pain 12/03/2017  . Hypotension 12/03/2017  . Subclavian artery stenosis, right (HCC) 12/03/2017  . Status post lumbar laminectomy 11/12/2017  . Postoperative pain after spinal surgery 11/11/2017  . Weakness of right lower extremity   . Sciatica associated with disorder of lumbosacral spine   . Surgery, elective   . Hip pain, acute, right 10/09/2017  . Bradycardia 10/09/2017  . Spondylolisthesis, lumbar region 10/05/2017  . Knee contusion 07/24/2016  . Stenosis of cervical spine with myelopathy (HCC) 05/14/2015   Past Medical History:  Diagnosis Date  . Anginal pain (HCC)    occ  . Arthritis    "joints" (10/09/2017)  . Chronic lower back pain   . Complication of anesthesia    extremely claustrophobic; "have to be put down for MRI; don't sit in back seat of car;, etc."  . Coronary artery disease   . High cholesterol    "get shots twice/month" (10/09/2017)  . History of blood transfusion 2001   "related to CABG"  . History of kidney stones   . HOH (hard of hearing)    wearing hearing aids  . Hypertension   . Myocardial infarction (HCC) 2000s-06/2017 X  4  . Stroke [facility]) ~ 2015   "in my right eye; sight is coming back little by little" (10/09/2017)  . Type II diabetes mellitus (HCC)     Family History  Problem Relation Age of Onset  . CAD Brother   . Diabetes Neg Hx     Past Surgical History:  Procedure Laterality Date  . ANTERIOR CERVICAL DECOMP/DISCECTOMY FUSION N/A 05/14/2015   Procedure: C3-4  ANTERIOR CERVICAL DISCECTOMY AND FUSION WITH PLATE AND SCREWS, LOCAL AND ALLOGRAFT BONE GRAFT;  Surgeon: Kerrin ChampagneJames E Alexes Menchaca, MD;  Location: [facility] OR;  Service: Orthopedics;  Laterality: N/A;  . BACK SURGERY    . CARDIAC CATHETERIZATION     X 17 (10/09/2017)  . CATARACT EXTRACTION W/ INTRAOCULAR LENS  IMPLANT, BILATERAL Bilateral   . COLONOSCOPY    . CORONARY ANGIOPLASTY  06/2017  . CORONARY ANGIOPLASTY WITH STENT PLACEMENT      "6 stents total" (10/09/2017)  . CORONARY ARTERY BYPASS GRAFT  2001   CABG X4  . CYSTOSCOPY W/ STONE MANIPULATION    . JOINT REPLACEMENT    . LUMBAR LAMINECTOMY Right 10/13/2017   Procedure: MICRODISCECTOMY LUMBAR LAMINECTOMY RIGHT L3-4;  Surgeon: Kerrin ChampagneNitka, Shanena Pellegrino E, MD;  [facility];  Service: Orthopedics;  Laterality: Right;  . LUMBAR LAMINECTOMY Left 11/12/2017   Procedure: MICRODISCECTOMY L3-4 FOR RECURRENT HNP POSSIBLE FLURO;  Surgeon: Kerrin ChampagneNitka, Kyrell Ruacho E, MD;  [facility];  Service: Orthopedics;  Laterality: Left;  . LUMBAR LAMINECTOMY/DECOMPRESSION MICRODISCECTOMY N/A 01/16/2014   Procedure: Left L3-4 and L4-5 foraminotomies;  Surgeon: Kerrin ChampagneJames E Fritzie Prioleau, MD;  [facility];  Service: Orthopedics;  Laterality: N/A;  . NASAL FRACTURE SURGERY    . NASAL SINUS SURGERY     "several times since 1959"  . RADIOLOGY WITH ANESTHESIA N/A 10/12/2017   Procedure: MRI WITH ANESTHESIA;  Surgeon: Radiologist, Medication, MD;  Location: [facility] OR;  Service: Radiology;  Laterality: N/A;  . RADIOLOGY WITH ANESTHESIA N/A 12/05/2017   Procedure: MRI WITH ANESTHESIA;  Surgeon: Radiologist, Medication, MD;  Location: [facility] OR;  Service: Radiology;  Laterality: N/A;  . SHOULDER OPEN ROTATOR CUFF REPAIR Bilateral   . TOTAL KNEE ARTHROPLASTY Right 2012  . TYMPANOSTOMY TUBE PLACEMENT Bilateral   . ULNAR NERVE TRANSPOSITION Left 05/14/2015   Procedure: LEFT ULNAR NERVE DECOMPRESSION AT THE ELBOW;  Surgeon: Kerrin ChampagneJames E Benny Deutschman, MD;  [facility];  Service: Orthopedics;  Laterality: Left;   Social History   Occupational History  . Not on file  Tobacco Use  . Smoking status: Former Smoker    Packs/day: 1.00    Years: 2.00    Pack years: 2.00    Last attempt to quit: 01/09/1957    Years since quitting: 61.1  . Smokeless tobacco: Never Used  Substance and Sexual Activity  . Alcohol use: No  . Drug use: No  . Sexual activity: Not Currently

## 2018-03-07 NOTE — Patient Instructions (Signed)
Avoid frequent bending and stooping  No lifting greater than 10 lbs. May use ice or moist heat for pain. Weight loss is of benefit. Handicap license is approved. Continue with the use of the brace when out of bed. Continue with vitamin D 50,000 IU per week for 2 months then 2,000 IU per day.  Calcium 500mg   Twice a day.

## 2018-03-11 ENCOUNTER — Other Ambulatory Visit (INDEPENDENT_AMBULATORY_CARE_PROVIDER_SITE_OTHER): Payer: Self-pay | Admitting: Specialist

## 2018-03-11 NOTE — Telephone Encounter (Signed)
Gabapentin refill request 

## 2018-03-19 ENCOUNTER — Other Ambulatory Visit (INDEPENDENT_AMBULATORY_CARE_PROVIDER_SITE_OTHER): Payer: Self-pay | Admitting: Specialist

## 2018-03-19 ENCOUNTER — Telehealth (INDEPENDENT_AMBULATORY_CARE_PROVIDER_SITE_OTHER): Payer: Self-pay | Admitting: Specialist

## 2018-03-19 DIAGNOSIS — M5416 Radiculopathy, lumbar region: Secondary | ICD-10-CM

## 2018-03-19 NOTE — Telephone Encounter (Signed)
Please see below. Looks like this should have been ordered a few weeks ago, and the patient has [employer]. It is in the referral WQ now.

## 2018-03-19 NOTE — Telephone Encounter (Signed)
Dr. Otelia SergeantNitka put in an order for him.Thank you

## 2018-03-21 NOTE — Telephone Encounter (Signed)
Pt is scheduled for 04/09/18

## 2018-04-09 ENCOUNTER — Ambulatory Visit (INDEPENDENT_AMBULATORY_CARE_PROVIDER_SITE_OTHER): Payer: Medicare HMO | Admitting: Physical Medicine and Rehabilitation

## 2018-04-09 ENCOUNTER — Encounter (INDEPENDENT_AMBULATORY_CARE_PROVIDER_SITE_OTHER): Payer: Self-pay | Admitting: Physical Medicine and Rehabilitation

## 2018-04-09 ENCOUNTER — Ambulatory Visit (INDEPENDENT_AMBULATORY_CARE_PROVIDER_SITE_OTHER): Payer: Self-pay

## 2018-04-09 VITALS — BP 142/78 | HR 67

## 2018-04-09 DIAGNOSIS — M5416 Radiculopathy, lumbar region: Secondary | ICD-10-CM | POA: Diagnosis not present

## 2018-04-09 DIAGNOSIS — M961 Postlaminectomy syndrome, not elsewhere classified: Secondary | ICD-10-CM

## 2018-04-09 MED ORDER — BETAMETHASONE SOD PHOS & ACET 6 (3-3) MG/ML IJ SUSP
12.0000 mg | Freq: Once | INTRAMUSCULAR | Status: AC
Start: 2018-04-09 — End: 2018-04-09
  Administered 2018-04-09: 12 mg

## 2018-04-09 NOTE — Patient Instructions (Signed)

## 2018-04-09 NOTE — Progress Notes (Signed)
 .  Numeric Pain Rating Scale and Functional Assessment Average Pain 7   In the last MONTH (on 0-10 scale) has pain interfered with the following?  1. General activity like being  able to carry out your everyday physical activities such as walking, climbing stairs, carrying groceries, or moving a chair?  Rating(4)   +Driver, -BT, -Dye Allergies.  

## 2018-04-09 NOTE — Procedures (Signed)
Lumbosacral Transforaminal Epidural Steroid Injection - Sub-Pedicular Approach with Fluoroscopic Guidance  Patient: Jeffrey Nelson      Date of Birth: 23-Aug-1935 MRN: [id number] PCP: Deloris Pingyter-Brown, Sherry M, MD      Visit Date: 04/09/2018   Universal Protocol:    Date/Time: 04/09/2018  Consent Given By: the patient  Position: PRONE  Additional Comments: Vital signs were monitored before and after the procedure. Patient was prepped and draped in the usual sterile fashion. The correct patient, procedure, and site was verified.   Injection Procedure Details:  Procedure Site One Meds Administered:  Meds ordered this encounter  Medications  . betamethasone acetate-betamethasone sodium phosphate (CELESTONE) injection 12 mg    Laterality: Left  Location/Site:  L2-L3 L3-L4  Needle size: 22 G  Needle type: Spinal  Needle Placement: Transforaminal  Findings:    -Comments: Excellent flow of contrast along the nerve and into the epidural space.  Procedure Details: After squaring off the end-plates to get a true AP view, the C-arm was positioned so that an oblique view of the foramen as noted above was visualized. The target area is just inferior to the "nose of the scotty dog" or sub pedicular. The soft tissues overlying this structure were infiltrated with 2-3 ml. of 1% Lidocaine without Epinephrine.  The spinal needle was inserted toward the target using a "trajectory" view along the fluoroscope beam.  Under AP and lateral visualization, the needle was advanced so it did not puncture dura and was located close the 6 O'Clock position of the pedical in AP tracterory. Biplanar projections were used to confirm position. Aspiration was confirmed to be negative for CSF and/or blood. A 1-2 ml. volume of Isovue-250 was injected and flow of contrast was noted at each level. Radiographs were obtained for documentation purposes.   After attaining the desired flow of contrast documented  above, a 0.5 to 1.0 ml test dose of 0.25% Marcaine was injected into each respective transforaminal space.  The patient was observed for 90 seconds post injection.  After no sensory deficits were reported, and normal lower extremity motor function was noted,   the above injectate was administered so that equal amounts of the injectate were placed at each foramen (level) into the transforaminal epidural space.   Additional Comments:  The patient tolerated the procedure well Dressing: Band-Aid    Post-procedure details: Patient was observed during the procedure. Post-procedure instructions were reviewed.  Patient left the clinic in stable condition.

## 2018-04-09 NOTE — Progress Notes (Signed)
Jeffrey Nelson - 83 y.o. male MRN [id number]  Date of birth: 08-Sep-1934  Office Visit Note: Visit Date: 04/09/2018 PCP: Deloris Pingyter-Brown, Sherry M, MD Referred by: Deloris Pingyter-Brown, Sherry M, *  Subjective: Chief Complaint  Patient presents with  . Lower Back - Pain  . Left Leg - Pain   HPI: Jeffrey Nelson is an 83 year old gentleman status post lumbar instrumented fusion from L3-L5.  Adjacent level disease with spurring and left leg pain.  Prior left dental to an L3 foraminal injection did give him some decent relief which is lasting.  We are going to repeat the injection per Dr. Barbaraann FasterNitka's request.  There does not appear to be some wedging of the L3 vertebral body.   ROS Otherwise per HPI.  Assessment & Plan: Visit Diagnoses:  1. Lumbar radiculopathy   2. Post laminectomy syndrome     Plan: No additional findings.   Meds & Orders:  Meds ordered this encounter  Medications  . betamethasone acetate-betamethasone sodium phosphate (CELESTONE) injection 12 mg    Orders Placed This Encounter  Procedures  . XR C-ARM NO REPORT  . Epidural Steroid injection    Follow-up: Return if symptoms worsen or fail to improve.   Procedures: No procedures performed  Lumbosacral Transforaminal Epidural Steroid Injection - Sub-Pedicular Approach with Fluoroscopic Guidance  Patient: Jeffrey Nelson      Date of Birth: 08-Sep-1934 MRN: [id number] PCP: Deloris Pingyter-Brown, Sherry M, MD      Visit Date: 04/09/2018   Universal Protocol:    Date/Time: 04/09/2018  Consent Given By: the patient  Position: PRONE  Additional Comments: Vital signs were monitored before and after the procedure. Patient was prepped and draped in the usual sterile fashion. The correct patient, procedure, and site was verified.   Injection Procedure Details:  Procedure Site One Meds Administered:  Meds ordered this encounter  Medications  . betamethasone acetate-betamethasone sodium phosphate (CELESTONE) injection 12 mg     Laterality: Left  Location/Site:  L2-L3 L3-L4  Needle size: 22 G  Needle type: Spinal  Needle Placement: Transforaminal  Findings:    -Comments: Excellent flow of contrast along the nerve and into the epidural space.  Procedure Details: After squaring off the end-plates to get a true AP view, the C-arm was positioned so that an oblique view of the foramen as noted above was visualized. The target area is just inferior to the "nose of the scotty dog" or sub pedicular. The soft tissues overlying this structure were infiltrated with 2-3 ml. of 1% Lidocaine without Epinephrine.  The spinal needle was inserted toward the target using a "trajectory" view along the fluoroscope beam.  Under AP and lateral visualization, the needle was advanced so it did not puncture dura and was located close the 6 O'Clock position of the pedical in AP tracterory. Biplanar projections were used to confirm position. Aspiration was confirmed to be negative for CSF and/or blood. A 1-2 ml. volume of Isovue-250 was injected and flow of contrast was noted at each level. Radiographs were obtained for documentation purposes.   After attaining the desired flow of contrast documented above, a 0.5 to 1.0 ml test dose of 0.25% Marcaine was injected into each respective transforaminal space.  The patient was observed for 90 seconds post injection.  After no sensory deficits were reported, and normal lower extremity motor function was noted,   the above injectate was administered so that equal amounts of the injectate were placed at each foramen (level) into the transforaminal epidural space.  Additional Comments:  The patient tolerated the procedure well Dressing: Band-Aid    Post-procedure details: Patient was observed during the procedure. Post-procedure instructions were reviewed.  Patient left the clinic in stable condition.    Clinical History: MRI LUMBAR SPINE WITHOUT AND WITH  CONTRAST  TECHNIQUE: Multiplanar and multiecho pulse sequences of the lumbar spine were obtained without and with intravenous contrast.  CONTRAST:  19mL MULTIHANCE GADOBENATE DIMEGLUMINE 529 MG/ML IV SOLN  COMPARISON:  MRI lumbar spine November 11, 2017  FINDINGS: SEGMENTATION: For the purposes of this report, the last well-formed intervertebral disc is reported as L5-S1 consistent with prior imaging.  ALIGNMENT: Maintained lumbar lordosis. No malalignment.  VERTEBRAE:Status post redo LEFT L3-4 and LEFT L4-5 laminectomies. Status post recent RIGHT L3-4 hemilaminectomy. Vertebral bodies intact. L3-4 disc edema in severe enhancing discogenic endplate changes. No associated abnormal disc enhancement. Stable moderate to severe L4-5 disc height loss with mild multilevel disc desiccation. Moderate L4-5 subacute discogenic endplate changes. Multilevel mild chronic discogenic endplate changes with trace discogenic endplate enhancement.  CONUS MEDULLARIS AND CAUDA EQUINA: Conus medullaris terminates at T12-L1 and demonstrates normal morphology and signal characteristics. Anteriorly displaced cauda equina at L5-S1 junction due to new lentiform 0.9 x 2.2 x 4.9 cm (AP by transverse by CC) posterior fluid collection with dependent T1 shortening and fluid fluid levels, no enhancement. It is unclear if this fluid collection is intrathecal or extradural. Cauda equina is otherwise unremarkable without nerve root clumping or abnormal enhancement.  PARASPINAL AND OTHER SOFT TISSUES: RIGHT greater than LEFT paraspinal muscle denervation at and below the level surgical and intervention. Irregular 3 x 1.3 cm fluid collection within the LEFT L3 surgical bed with faint marginal enhancement most compatible with seroma.  DISC LEVELS:  L1-2: Annular bulging. No canal stenosis. Mild RIGHT neural foraminal narrowing.  L2-3: Similar moderate broad-based disc bulge. Mild facet arthropathy and  ligamentum flavum redundancy. Minimal canal stenosis. Moderate RIGHT, mild LEFT neural foraminal narrowing.  L3-4: Bilateral laminectomies. Similar 6 x 19 mm fluid collection within RIGHT laminectomy site extending to discectomy margin with thick rim enhancement effacing the RIGHT lateral recess. Enhancement extends into the RIGHT facet with slight RIGHT facet widening new from prior study. Granulation tissue encasing the traversing LEFT L4 nerve. No canal stenosis though there is slight medial displacement of the traversing RIGHT L4 nerve. Severe bilateral neural foraminal narrowing. Extensive annular enhancement consistent with discectomies without residual or recurrent disc protrusion.  L4-5: Interval apparent redo LEFT hemilaminectomy hemilaminectomy. Small broad-based disc bulge asymmetric to the LEFT similar to prior examination with enhancing annular fissures. Mild RIGHT facet arthropathy. No canal stenosis. Mild RIGHT, moderate to severe LEFT neural foraminal narrowing. Granulation tissue LEFT lateral recess encasing the traversing LEFT L5 nerve.  L5-S1: Transitional anatomy with pseudoarthrosis. No disc bulge, canal stenosis nor neural foraminal narrowing.  IMPRESSION: 1. Status post interval LEFT L3-4 and suspected L4-5 redo laminectomies. Exuberant discogenic endplate changes L3-4, less likely discitis osteomyelitis. 2. Recent RIGHT L3-4 hemilaminectomy. Stable 6 x 9 mm fluid collection within RIGHT surgical bed equivocal for abscess, however there is new enhancing RIGHT L3-4 facet effusion and mild widening may be infectious or inflammatory. 3. New 0.9 x 2.2 x 4.9 cm subacute hematoma at L5-S1 anteriorly displacing the cauda equina. 4. No canal stenosis. L2-3 through L4-5 neural foraminal narrowing: Severe at L3-4.   Electronically Signed   By: Awilda Metroourtnay  Bloomer M.D.   On: 12/05/2017 14:22   He reports that he quit smoking about 61 years ago.  He has a 2.00  pack-year smoking history. He has never used smokeless tobacco.  Recent Labs    10/09/17 1226 12/04/17 0342  HGBA1C 6.6* 6.8*    Objective:  VS:  HT:    WT:   BMI:     BP:(!) 142/78  HR:67bpm  TEMP: ( )  RESP:  Physical Exam  Ortho Exam Imaging: Xr C-arm No Report  Result Date: 04/09/2018 Please see Notes tab for imaging impression.   Past Medical/Family/Surgical/Social History: Medications & Allergies reviewed per EMR, new medications updated. Patient Active Problem List   Diagnosis Date Noted  . Lumbosacral radiculopathy at L3   . Tachycardia 12/14/2017  . Leukocytosis 12/14/2017  . Anemia due to blood loss 12/13/2017    Class: Chronic  . Syncope 12/03/2017  . Dehydration 12/03/2017  . Chronic back pain 12/03/2017  . Hypotension 12/03/2017  . Subclavian artery stenosis, right (HCC) 12/03/2017  . Status post lumbar laminectomy 11/12/2017  . Postoperative pain after spinal surgery 11/11/2017  . Weakness of right lower extremity   . Sciatica associated with disorder of lumbosacral spine   . Surgery, elective   . Diabetes mellitus type 2, uncomplicated (HCC) 10/14/2017    Class: Chronic  . Lumbar disc herniation with radiculopathy 10/13/2017    Class: Acute  . Radiculopathy, lumbar region 10/09/2017  . Pain in right hip 10/09/2017    Class: Acute  . Right knee sprain 10/09/2017    Class: Acute  . Hip pain, acute, right 10/09/2017  . Bradycardia 10/09/2017  . Spondylolisthesis, lumbar region 10/05/2017  . Knee contusion 07/24/2016  . Spinal stenosis in cervical region 05/14/2015    Class: Chronic  . Cubital tunnel syndrome on left 05/14/2015    Class: Chronic  . Stenosis of cervical spine with myelopathy (HCC) 05/14/2015  . Spinal stenosis, lumbar region, with neurogenic claudication 01/16/2014    Class: Chronic   Past Medical History:  Diagnosis Date  . Anginal pain (HCC)    occ  . Arthritis    "joints" (10/09/2017)  . Chronic lower back pain   .  Complication of anesthesia    extremely claustrophobic; "have to be put down for MRI; don't sit in back seat of car;, etc."  . Coronary artery disease   . High cholesterol    "get shots twice/month" (10/09/2017)  . History of blood transfusion 2001   "related to CABG"  . History of kidney stones   . HOH (hard of hearing)    wearing hearing aids  . Hypertension   . Myocardial infarction (HCC) 2000s-06/2017 X 4  . Stroke [facility]) ~ 2015   "in my right eye; sight is coming back little by little" (10/09/2017)  . Type II diabetes mellitus (HCC)    Family History  Problem Relation Age of Onset  . CAD Brother   . Diabetes Neg Hx    Past Surgical History:  Procedure Laterality Date  . ANTERIOR CERVICAL DECOMP/DISCECTOMY FUSION N/A 05/14/2015   Procedure: C3-4  ANTERIOR CERVICAL DISCECTOMY AND FUSION WITH PLATE AND SCREWS, LOCAL AND ALLOGRAFT BONE GRAFT;  Surgeon: Kerrin Champagne, MD;  Location: [facility] OR;  Service: Orthopedics;  Laterality: N/A;  . BACK SURGERY    . CARDIAC CATHETERIZATION     X 17 (10/09/2017)  . CATARACT EXTRACTION W/ INTRAOCULAR LENS  IMPLANT, BILATERAL Bilateral   . COLONOSCOPY    . CORONARY ANGIOPLASTY  06/2017  . CORONARY ANGIOPLASTY WITH STENT PLACEMENT     "6 stents total" (10/09/2017)  . CORONARY  ARTERY BYPASS GRAFT  2001   CABG X4  . CYSTOSCOPY W/ STONE MANIPULATION    . JOINT REPLACEMENT    . LUMBAR LAMINECTOMY Right 10/13/2017   Procedure: MICRODISCECTOMY LUMBAR LAMINECTOMY RIGHT L3-4;  Surgeon: Kerrin Champagne, MD;  Location: [facility] OR;  Service: Orthopedics;  Laterality: Right;  . LUMBAR LAMINECTOMY Left 11/12/2017   Procedure: MICRODISCECTOMY L3-4 FOR RECURRENT HNP POSSIBLE FLURO;  Surgeon: Kerrin Champagne, MD;  Location: [facility] OR;  Service: Orthopedics;  Laterality: Left;  . LUMBAR LAMINECTOMY/DECOMPRESSION MICRODISCECTOMY N/A 01/16/2014   Procedure: Left L3-4 and L4-5 foraminotomies;  Surgeon: Kerrin Champagne, MD;  Location: [facility] OR;  Service: Orthopedics;  Laterality: N/A;  .  NASAL FRACTURE SURGERY    . NASAL SINUS SURGERY     "several times since 1959"  . RADIOLOGY WITH ANESTHESIA N/A 10/12/2017   Procedure: MRI WITH ANESTHESIA;  Surgeon: Radiologist, Medication, MD;  Location: [facility] OR;  Service: Radiology;  Laterality: N/A;  . RADIOLOGY WITH ANESTHESIA N/A 12/05/2017   Procedure: MRI WITH ANESTHESIA;  Surgeon: Radiologist, Medication, MD;  Location: [facility] OR;  Service: Radiology;  Laterality: N/A;  . SHOULDER OPEN ROTATOR CUFF REPAIR Bilateral   . TOTAL KNEE ARTHROPLASTY Right 2012  . TYMPANOSTOMY TUBE PLACEMENT Bilateral   . ULNAR NERVE TRANSPOSITION Left 05/14/2015   Procedure: LEFT ULNAR NERVE DECOMPRESSION AT THE ELBOW;  Surgeon: Kerrin Champagne, MD;  Location: [facility] OR;  Service: Orthopedics;  Laterality: Left;   Social History   Occupational History  . Not on file  Tobacco Use  . Smoking status: Former Smoker    Packs/day: 1.00    Years: 2.00    Pack years: 2.00    Last attempt to quit: 01/09/1957    Years since quitting: 61.2  . Smokeless tobacco: Never Used  Substance and Sexual Activity  . Alcohol use: No  . Drug use: No  . Sexual activity: Not Currently

## 2018-04-18 ENCOUNTER — Other Ambulatory Visit (INDEPENDENT_AMBULATORY_CARE_PROVIDER_SITE_OTHER): Payer: Self-pay | Admitting: Specialist

## 2018-04-19 NOTE — Telephone Encounter (Signed)
Please advise 

## 2018-04-24 ENCOUNTER — Ambulatory Visit (INDEPENDENT_AMBULATORY_CARE_PROVIDER_SITE_OTHER): Payer: Medicare HMO

## 2018-04-24 ENCOUNTER — Encounter (INDEPENDENT_AMBULATORY_CARE_PROVIDER_SITE_OTHER): Payer: Self-pay | Admitting: Specialist

## 2018-04-24 ENCOUNTER — Ambulatory Visit (INDEPENDENT_AMBULATORY_CARE_PROVIDER_SITE_OTHER): Payer: Medicare HMO | Admitting: Specialist

## 2018-04-24 VITALS — BP 119/61 | HR 55 | Ht 74.0 in | Wt 198.0 lb

## 2018-04-24 DIAGNOSIS — M5416 Radiculopathy, lumbar region: Secondary | ICD-10-CM

## 2018-04-24 DIAGNOSIS — S32030S Wedge compression fracture of third lumbar vertebra, sequela: Secondary | ICD-10-CM

## 2018-04-24 DIAGNOSIS — M85851 Other specified disorders of bone density and structure, right thigh: Secondary | ICD-10-CM

## 2018-04-24 DIAGNOSIS — M85852 Other specified disorders of bone density and structure, left thigh: Secondary | ICD-10-CM

## 2018-04-24 MED ORDER — GABAPENTIN 300 MG PO CAPS: 300.0000 mg | ORAL_CAPSULE | Freq: Two times a day (BID) | ORAL | 0 refills | Status: DC

## 2018-04-24 NOTE — Patient Instructions (Signed)
Avoid frequent bending and stooping  No lifting greater than 10 lbs. May use ice or moist heat for pain. Weight loss is of benefit.  Exercise is important to improve your indurance and does allow people to function better inspite of back pain. You have weakened bone strength and radiographs show a compression fracture at the upper end of your lumbar fusion, vitamin D levels are low, after this Rx for Vitamin D I recomment  That you start 2,000 Units of Vitamin D tablets daily.  I am referring you to Dr. Corliss Skainseveshwar for evaluation and treatment of osteopenia with  Pathologic compression fracture due to osteopenia.

## 2018-04-24 NOTE — Progress Notes (Signed)
Office Visit Note   Patient: Jeffrey Nelson           Date of Birth: 09-20-1934           MRN: [id number] Visit Date: 04/24/2018              Requested by: Deloris Pingyter-Brown, Sherry M, MD [address] [place], [place] [id number] PCP: Deloris Pingyter-Brown, Sherry M, MD   Assessment & Plan: Visit Diagnoses:  1. Chronic left lumbar radiculopathy   2. Closed compression fracture of L3 lumbar vertebra, sequela   3. Osteopenia of both hips     Plan: Avoid frequent bending and stooping  No lifting greater than 10 lbs. May use ice or moist heat for pain. Weight loss is of benefit.  Exercise is important to improve your indurance and does allow people to function better inspite of back pain. You have weakened bone strength and radiographs show a compression fracture at the upper end of your lumbar fusion, vitamin D levels are low, after this Rx for Vitamin D I recomment  That you start 2,000 Units of Vitamin D tablets daily.  I am referring you to Dr. Corliss Skainseveshwar for evaluation and treatment of osteopenia with  Pathologic compression fracture due to osteopenia.    Follow-Up Instructions: Return in about 4 weeks (around 05/22/2018).   Orders:  Orders Placed This Encounter  Procedures  . XR Lumbar Spine 2-3 Views  . Ambulatory referral to Rheumatology   No orders of the defined types were placed in this encounter.     Procedures: No procedures performed   Clinical Data: No additional findings.   Subjective: Chief Complaint  Patient presents with  . Lower Back - Follow-up    83 year old male 4 1/2 month post L3-L5 fusion for severe collapsing foramenal stenosis with left L3 and L4 radiculopathy. He has pain in the left anterior thigh, most noteabe in the morning when he first arrises and after walking it improves. He is doing yard work and [employer] eating. [place] No bowel or bladder difficulty. He is doing better with urinary retention, sees Dr.  Lindley MagnusEskew for this and reportedly has less than 1 oz in bladder at the end of voiding.    Review of Systems  Constitutional: Negative.   HENT: Negative.   Eyes: Negative.   Respiratory: Negative.   Cardiovascular: Negative.   Gastrointestinal: Negative.   Endocrine: Negative.   Genitourinary: Negative.   Musculoskeletal: Negative.   Skin: Negative.   Allergic/Immunologic: Negative.   Neurological: Negative.   Hematological: Negative.   Psychiatric/Behavioral: Negative.      Objective: Vital Signs: BP 119/61 (BP Location: Left Arm, Patient Position: Sitting)   Pulse (!) 55   Ht 6\' 2"  (1.88 m)   Wt 198 lb (89.8 kg)   BMI 25.42 kg/m   Physical Exam  Constitutional: He is oriented to person, place, and time. He appears well-developed and well-nourished.  HENT:  Head: Normocephalic and atraumatic.  Eyes: Pupils are equal, round, and reactive to light. EOM are normal.  Neck: Normal range of motion. Neck supple.  Pulmonary/Chest: Effort normal and breath sounds normal.  Abdominal: Soft. Bowel sounds are normal.  Neurological: He is alert and oriented to person, place, and time.  Skin: Skin is warm and dry.  Psychiatric: He has a normal mood and affect. His behavior is normal. Judgment and thought content normal.    Back Exam   Tenderness  The patient is  experiencing tenderness in the lumbar.  Range of Motion  Extension: abnormal  Flexion: abnormal  Lateral bend right: abnormal  Lateral bend left: abnormal  Rotation right: abnormal  Rotation left: abnormal   Muscle Strength  The patient has normal back strength. Right Quadriceps:  5/5  Left Quadriceps:  5/5  Right Hamstrings:  5/5  Left Hamstrings:  5/5   Tests  Straight leg raise right: negative Straight leg raise left: negative  Reflexes  Patellar: abnormal Achilles: normal Biceps: normal Babinski's sign: normal   Other  Toe walk: normal Heel walk: normal Sensation: normal Gait: normal  Erythema: no  back redness Scars: present      Specialty Comments:  No specialty comments available.  Imaging: Xr Lumbar Spine 2-3 Views  Result Date: 04/24/2018 AP and lateral flexion and extension radiographs show compression fracture at L3 with the pedicle screws cut through the superior endplate of L3, With flexion and extension there does not appear to be any motion at the L3-4 and L4-5 TLIFs.     PMFS History: Patient Active Problem List   Diagnosis Date Noted  . Anemia due to blood loss 12/13/2017    Priority: High    Class: Chronic  . Lumbar disc herniation with radiculopathy 10/13/2017    Priority: High    Class: Acute  . Radiculopathy, lumbar region 10/09/2017    Priority: High  . Pain in right hip 10/09/2017    Priority: High    Class: Acute  . Right knee sprain 10/09/2017    Priority: High    Class: Acute  . Spinal stenosis in cervical region 05/14/2015    Priority: High    Class: Chronic  . Cubital tunnel syndrome on left 05/14/2015    Priority: High    Class: Chronic  . Spinal stenosis, lumbar region, with neurogenic claudication 01/16/2014    Priority: High    Class: Chronic  . Diabetes mellitus type 2, uncomplicated (HCC) 10/14/2017    Priority: Medium    Class: Chronic  . Lumbosacral radiculopathy at L3   . Tachycardia 12/14/2017  . Leukocytosis 12/14/2017  . Syncope 12/03/2017  . Dehydration 12/03/2017  . Chronic back pain 12/03/2017  . Hypotension 12/03/2017  . Subclavian artery stenosis, right (HCC) 12/03/2017  . Status post lumbar laminectomy 11/12/2017  . Postoperative pain after spinal surgery 11/11/2017  . Weakness of right lower extremity   . Sciatica associated with disorder of lumbosacral spine   . Surgery, elective   . Hip pain, acute, right 10/09/2017  . Bradycardia 10/09/2017  . Spondylolisthesis, lumbar region 10/05/2017  . Knee contusion 07/24/2016  . Stenosis of cervical spine with myelopathy (HCC) 05/14/2015   Past Medical History:    Diagnosis Date  . Anginal pain (HCC)    occ  . Arthritis    "joints" (10/09/2017)  . Chronic lower back pain   . Complication of anesthesia    extremely claustrophobic; "have to be put down for MRI; don't sit in back seat of car;, etc."  . Coronary artery disease   . High cholesterol    "get shots twice/month" (10/09/2017)  . History of blood transfusion 2001   "related to CABG"  . History of kidney stones   . HOH (hard of hearing)    wearing hearing aids  . Hypertension   . Myocardial infarction (HCC) 2000s-06/2017 X 4  . Stroke [facility]) ~ 2015   "in my right eye; sight is coming back little by little" (10/09/2017)  . Type II diabetes  mellitus (HCC)     Family History  Problem Relation Age of Onset  . CAD Brother   . Diabetes Neg Hx     Past Surgical History:  Procedure Laterality Date  . ANTERIOR CERVICAL DECOMP/DISCECTOMY FUSION N/A 05/14/2015   Procedure: C3-4  ANTERIOR CERVICAL DISCECTOMY AND FUSION WITH PLATE AND SCREWS, LOCAL AND ALLOGRAFT BONE GRAFT;  Surgeon: Kerrin Champagne, MD;  Location: [facility] OR;  Service: Orthopedics;  Laterality: N/A;  . BACK SURGERY    . CARDIAC CATHETERIZATION     X 17 (10/09/2017)  . CATARACT EXTRACTION W/ INTRAOCULAR LENS  IMPLANT, BILATERAL Bilateral   . COLONOSCOPY    . CORONARY ANGIOPLASTY  06/2017  . CORONARY ANGIOPLASTY WITH STENT PLACEMENT     "6 stents total" (10/09/2017)  . CORONARY ARTERY BYPASS GRAFT  2001   CABG X4  . CYSTOSCOPY W/ STONE MANIPULATION    . JOINT REPLACEMENT    . LUMBAR LAMINECTOMY Right 10/13/2017   Procedure: MICRODISCECTOMY LUMBAR LAMINECTOMY RIGHT L3-4;  Surgeon: Kerrin Champagne, MD;  Location: [facility] OR;  Service: Orthopedics;  Laterality: Right;  . LUMBAR LAMINECTOMY Left 11/12/2017   Procedure: MICRODISCECTOMY L3-4 FOR RECURRENT HNP POSSIBLE FLURO;  Surgeon: Kerrin Champagne, MD;  Location: [facility] OR;  Service: Orthopedics;  Laterality: Left;  . LUMBAR LAMINECTOMY/DECOMPRESSION MICRODISCECTOMY N/A 01/16/2014   Procedure: Left  L3-4 and L4-5 foraminotomies;  Surgeon: Kerrin Champagne, MD;  Location: [facility] OR;  Service: Orthopedics;  Laterality: N/A;  . NASAL FRACTURE SURGERY    . NASAL SINUS SURGERY     "several times since 1959"  . RADIOLOGY WITH ANESTHESIA N/A 10/12/2017   Procedure: MRI WITH ANESTHESIA;  Surgeon: Radiologist, Medication, MD;  Location: [facility] OR;  Service: Radiology;  Laterality: N/A;  . RADIOLOGY WITH ANESTHESIA N/A 12/05/2017   Procedure: MRI WITH ANESTHESIA;  Surgeon: Radiologist, Medication, MD;  Location: [facility] OR;  Service: Radiology;  Laterality: N/A;  . SHOULDER OPEN ROTATOR CUFF REPAIR Bilateral   . TOTAL KNEE ARTHROPLASTY Right 2012  . TYMPANOSTOMY TUBE PLACEMENT Bilateral   . ULNAR NERVE TRANSPOSITION Left 05/14/2015   Procedure: LEFT ULNAR NERVE DECOMPRESSION AT THE ELBOW;  Surgeon: Kerrin Champagne, MD;  Location: [facility] OR;  Service: Orthopedics;  Laterality: Left;   Social History   Occupational History  . Not on file  Tobacco Use  . Smoking status: Former Smoker    Packs/day: 1.00    Years: 2.00    Pack years: 2.00    Last attempt to quit: 01/09/1957    Years since quitting: 61.3  . Smokeless tobacco: Never Used  Substance and Sexual Activity  . Alcohol use: No  . Drug use: No  . Sexual activity: Not Currently

## 2018-05-02 NOTE — Progress Notes (Signed)
Office Visit Note  Patient: Jeffrey Nelson             Date of Birth: 1935/01/23           MRN: [id number]             PCP: Deloris Ping, MD Referring: Kerrin Champagne, MD Visit Date: 05/03/2018 Occupation: @GUAROCC @  Subjective:  History of vertebral fracture.   History of Present Illness: Jeffrey Nelson is a 83 y.o. male seen in consultation per request of Dr. Otelia Sergeant for evaluation and treatment of vertebral fracture.  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  He has had multiple disc rupture and this year.  He underwent discectomy and lumbar spine fusion with ongoing pain and discomfort.  He had recent x-rays showed L3 vertebral fracture.  He is referred to me by Dr. Otelia Sergeant for treatment of vertebral fracture.  He denies any history of radiation therapy or bone cancer.  Activities of Daily Living:  Patient reports morning stiffness for 4 hours.   Patient Reports nocturnal pain.  Difficulty dressing/grooming: Reports Difficulty climbing stairs: Reports Difficulty getting out of chair: Reports Difficulty using hands for taps, buttons, cutlery, and/or writing: Denies  Review of Systems  Constitutional: Positive for fatigue. Negative for night sweats.  HENT: Negative for mouth sores, mouth dryness and nose dryness.   Eyes: Negative for redness and dryness.  Respiratory: Negative for shortness of breath and difficulty breathing.   Cardiovascular: Negative for chest pain, palpitations, hypertension, irregular heartbeat and swelling in legs/feet.  Gastrointestinal: Negative for constipation and diarrhea.  Endocrine: Negative for increased urination.  Musculoskeletal: Positive for arthralgias, joint pain, myalgias, morning stiffness and myalgias. Negative for joint swelling, muscle weakness and muscle tenderness.  Skin: Negative for color change, rash, hair loss, nodules/bumps, skin tightness, ulcers and sensitivity to  sunlight.  Allergic/Immunologic: Negative for susceptible to infections.  Neurological: Negative for dizziness, fainting, memory loss, night sweats and weakness ( ).  Hematological: Negative for swollen glands.  Psychiatric/Behavioral: Negative for depressed mood and sleep disturbance. The patient is not nervous/anxious.     PMFS History:  Patient Active Problem List   Diagnosis Date Noted  . Lumbosacral radiculopathy at L3   . Tachycardia 12/14/2017  . Leukocytosis 12/14/2017  . Anemia due to blood loss 12/13/2017    Class: Chronic  . Syncope 12/03/2017  . Dehydration 12/03/2017  . Chronic back pain 12/03/2017  . Hypotension 12/03/2017  . Subclavian artery stenosis, right (HCC) 12/03/2017  . Status post lumbar laminectomy 11/12/2017  . Postoperative pain after spinal surgery 11/11/2017  . Weakness of right lower extremity   . Sciatica associated with disorder of lumbosacral spine   . Surgery, elective   . Diabetes mellitus type 2, uncomplicated (HCC) 10/14/2017    Class: Chronic  . Lumbar disc herniation with radiculopathy 10/13/2017    Class: Acute  . Radiculopathy, lumbar region 10/09/2017  . Pain in right hip 10/09/2017    Class: Acute  . Right knee sprain 10/09/2017    Class: Acute  . Hip pain, acute, right 10/09/2017  . Bradycardia 10/09/2017  . Spondylolisthesis, lumbar region 10/05/2017  . Knee contusion 07/24/2016  . Spinal stenosis in cervical region 05/14/2015    Class: Chronic  . Cubital tunnel syndrome on left 05/14/2015    Class: Chronic  . Stenosis of cervical spine with myelopathy (HCC) 05/14/2015  . Spinal stenosis, lumbar region, with neurogenic claudication 01/16/2014  Class: Chronic    Past Medical History:  Diagnosis Date  . Anginal pain (HCC)    occ  . Arthritis    "joints" (10/09/2017)  . Chronic lower back pain   . Complication of anesthesia    extremely claustrophobic; "have to be put down for MRI; don't sit in back seat of car;, etc."    . Coronary artery disease   . High cholesterol    "get shots twice/month" (10/09/2017)  . History of blood transfusion 2001   "related to CABG"  . History of kidney stones   . HOH (hard of hearing)    wearing hearing aids  . Hypertension   . Myocardial infarction (HCC) 2000s-06/2017 X 4  . Stroke [facility]) ~ 2015   "in my right eye; sight is coming back little by little" (10/09/2017)  . Type II diabetes mellitus (HCC)     Family History  Problem Relation Age of Onset  . CAD Brother   . Cancer Brother   . Alzheimer's disease Mother   . Healthy Daughter   . Healthy Daughter   . Diabetes Neg Hx    Past Surgical History:  Procedure Laterality Date  . ANTERIOR CERVICAL DECOMP/DISCECTOMY FUSION N/A 05/14/2015   Procedure: C3-4  ANTERIOR CERVICAL DISCECTOMY AND FUSION WITH PLATE AND SCREWS, LOCAL AND ALLOGRAFT BONE GRAFT;  Surgeon: Kerrin Champagne, MD;  Location: [facility] OR;  Service: Orthopedics;  Laterality: N/A;  . BACK SURGERY    . CARDIAC CATHETERIZATION     X 17 (10/09/2017)  . CATARACT EXTRACTION W/ INTRAOCULAR LENS  IMPLANT, BILATERAL Bilateral   . COLONOSCOPY    . CORONARY ANGIOPLASTY  06/2017  . CORONARY ANGIOPLASTY WITH STENT PLACEMENT     "6 stents total" (10/09/2017)  . CORONARY ARTERY BYPASS GRAFT  2001   CABG x4   . CYSTOSCOPY W/ STONE MANIPULATION    . JOINT REPLACEMENT    . LUMBAR LAMINECTOMY Right 10/13/2017   Procedure: MICRODISCECTOMY LUMBAR LAMINECTOMY RIGHT L3-4;  Surgeon: Kerrin Champagne, MD;  Location: [facility] OR;  Service: Orthopedics;  Laterality: Right;  . LUMBAR LAMINECTOMY Left 11/12/2017   Procedure: MICRODISCECTOMY L3-4 FOR RECURRENT HNP POSSIBLE FLURO;  Surgeon: Kerrin Champagne, MD;  Location: [facility] OR;  Service: Orthopedics;  Laterality: Left;  . LUMBAR LAMINECTOMY/DECOMPRESSION MICRODISCECTOMY N/A 01/16/2014   Procedure: Left L3-4 and L4-5 foraminotomies;  Surgeon: Kerrin Champagne, MD;  Location: [facility] OR;  Service: Orthopedics;  Laterality: N/A;  . NASAL FRACTURE SURGERY     . NASAL SINUS SURGERY     "several times since 1959"  . RADIOLOGY WITH ANESTHESIA N/A 10/12/2017   Procedure: MRI WITH ANESTHESIA;  Surgeon: Radiologist, Medication, MD;  Location: [facility] OR;  Service: Radiology;  Laterality: N/A;  . RADIOLOGY WITH ANESTHESIA N/A 12/05/2017   Procedure: MRI WITH ANESTHESIA;  Surgeon: Radiologist, Medication, MD;  Location: [facility] OR;  Service: Radiology;  Laterality: N/A;  . SHOULDER OPEN ROTATOR CUFF REPAIR Bilateral   . TOTAL KNEE ARTHROPLASTY Right 2012  . TYMPANOSTOMY TUBE PLACEMENT Bilateral   . ULNAR NERVE TRANSPOSITION Left 05/14/2015   Procedure: LEFT ULNAR NERVE DECOMPRESSION AT THE ELBOW;  Surgeon: Kerrin Champagne, MD;  Location: [facility] OR;  Service: Orthopedics;  Laterality: Left;   Social History   Social History Narrative  . Not on file    Objective: Vital Signs: BP (!) 113/58   Pulse 63   Resp 15   Ht 6\' 2"  (1.88 m)   Wt 219 lb (99.3 kg)  BMI 28.12 kg/m    Physical Exam  Constitutional: He is oriented to person, place, and time. He appears well-developed and well-nourished.  HENT:  Head: Normocephalic and atraumatic.  Eyes: Pupils are equal, round, and reactive to light. Conjunctivae and EOM are normal.  Neck: Normal range of motion. Neck supple.  Cardiovascular: Normal rate, regular rhythm and normal heart sounds.  Pulmonary/Chest: Effort normal and breath sounds normal.  Abdominal: Soft. Bowel sounds are normal.  Neurological: He is alert and oriented to person, place, and time.  Left lower extremity weakness and muscle atrophy  Skin: Skin is warm and dry. Capillary refill takes less than 2 seconds.  Psychiatric: He has a normal mood and affect. His behavior is normal.  Nursing note and vitals reviewed.    Musculoskeletal Exam: C-spine and thoracic lumbar spine limited painful range of motion.  Shoulder joints elbow joints wrist joints were in good range of motion.  He had no PIP/DIP thickening.  His right knee is replaced.  There is loss  of muscle mass in his left lower extremity.  No synovitis was noted.  CDAI Exam: CDAI Score: Not documented Patient Global Assessment: Not documented; Provider Global Assessment: Not documented Swollen: Not documented; Tender: Not documented Joint Exam   Not documented   There is currently no information documented on the homunculus. Go to the Rheumatology activity and complete the homunculus joint exam.  Investigation: No additional findings. Component     Latest Ref Rng & Units 01/23/2018 02/06/2018  Total Protein     6.1 - 8.1 g/dL  7.1  Albumin ELP     3.8 - 4.8 g/dL  3.8  Alpha 1     0.2 - 0.3 g/dL  0.2  Alpha 2     0.5 - 0.9 g/dL  0.8  Beta Globulin     0.4 - 0.6 g/dL  0.4  Beta 2     0.2 - 0.5 g/dL  0.4  Gamma Globulin     0.8 - 1.7 g/dL  1.3  SPE Interp.         Vitamin D, 25-Hydroxy     30 - 100 ng/mL 31   Sed Rate     0 - 20 mm/h 67 (H)   PSA     < OR = 4.0 ng/mL 0.9    Imaging: Xr C-arm No Report  Result Date: 04/09/2018 Please see Notes tab for imaging impression.  Xr Lumbar Spine 2-3 Views  Result Date: 04/24/2018 AP and lateral flexion and extension radiographs show compression fracture at L3 with the pedicle screws cut through the superior endplate of L3, With flexion and extension there does not appear to be any motion at the L3-4 and L4-5 TLIFs.    Recent Labs: Lab Results  Component Value Date   WBC 13.0 (H) 12/15/2017   HGB 8.6 (L) 12/15/2017   PLT 246 12/15/2017   NA 135 12/16/2017   K 3.9 12/16/2017   CL 97 (L) 12/16/2017   CO2 29 12/16/2017   GLUCOSE 138 (H) 12/16/2017   BUN 15 12/16/2017   CREATININE 0.75 12/16/2017   BILITOT 0.6 12/04/2017   ALKPHOS 132 (H) 12/04/2017   AST 18 12/04/2017   ALT 15 (L) 12/04/2017   PROT 7.1 02/06/2018   ALBUMIN 2.9 (L) 12/04/2017   CALCIUM 8.8 (L) 12/16/2017   GFRAA >60 12/16/2017    Speciality Comments: No specialty comments available.  Procedures:  No procedures performed Allergies:  Ezetimibe; Morphine and related; and Statins  Assessment / Plan:     Visit Diagnoses: Closed compression fracture of L3 lumbar vertebra, sequela - Sed rate 67, SPEP Normal, PSA 0.9, Vitamin D 31 (after tx with Vitamin D 50,000 units once a week).  Detailed discussion regarding compression fracture.  Different treatment options and their side effects were discussed.  He will benefit from [facility] or Tymlos for treatment of osteoporotic fracture.  Although his bone density is in the osteopenia range he has been having difficulty healing after surgery.  Indications side effects contraindications were discussed at length.  Patient had no history of any bone cancer.  I will obtain labs today.  I plan to apply for Tymlos.  Once approved we will have him come in the office for first injection.  I reviewed all previous records and labs.  Osteopenia of multiple sites - Plan: Parathyroid hormone, intact (no Ca), Phosphorus, Testosterone  Other fatigue - Plan: TSH, CBC with Differential/Platelet, COMPLETE METABOLIC PANEL WITH GFR, Sedimentation rate.  His sed rate was elevated and WBC count was high.  I will repeat that today.  Status post total right knee replacement -doing well.  Stenosis of cervical spine with myelopathy (HCC)-he has limited range of motion with some discomfort.  Sciatica associated with disorder of lumbosacral spine-he continues to have chronic pain despite fusion.  He has left lower extremity muscle weakness and muscle atrophy.  Spinal stenosis, lumbar region, with neurogenic claudication - s/p laminectomy  Cubital tunnel syndrome on left  History of diabetes mellitus, type II  Essential hypertension  History of stroke  History of MI (myocardial infarction)  Subclavian artery stenosis, right (HCC)  History of kidney stones    Orders: Orders Placed This Encounter  Procedures  . Parathyroid hormone, intact (no Ca)  . Phosphorus  . TSH  . Testosterone  . CBC with  Differential/Platelet  . COMPLETE METABOLIC PANEL WITH GFR  . Sedimentation rate   No orders of the defined types were placed in this encounter.   Face-to-face time spent with patient was 50 minutes. Greater than 50% of time was spent in counseling and coordination of care.  Follow-Up Instructions: Return for Compression fracture.   Pollyann Savoy, MD  Note - This record has been created using Animal nutritionist.  Chart creation errors have been sought, but may not always  have been located. Such creation errors do not reflect on  the standard of medical care.

## 2018-05-03 ENCOUNTER — Encounter: Payer: Self-pay | Admitting: Rheumatology

## 2018-05-03 ENCOUNTER — Telehealth: Payer: Self-pay | Admitting: Pharmacy Technician

## 2018-05-03 ENCOUNTER — Ambulatory Visit: Payer: Medicare HMO | Admitting: Rheumatology

## 2018-05-03 VITALS — BP 113/58 | HR 63 | Resp 15 | Ht 74.0 in | Wt 219.0 lb

## 2018-05-03 DIAGNOSIS — G992 Myelopathy in diseases classified elsewhere: Secondary | ICD-10-CM

## 2018-05-03 DIAGNOSIS — Z8673 Personal history of transient ischemic attack (TIA), and cerebral infarction without residual deficits: Secondary | ICD-10-CM

## 2018-05-03 DIAGNOSIS — I771 Stricture of artery: Secondary | ICD-10-CM

## 2018-05-03 DIAGNOSIS — G5622 Lesion of ulnar nerve, left upper limb: Secondary | ICD-10-CM

## 2018-05-03 DIAGNOSIS — M4802 Spinal stenosis, cervical region: Secondary | ICD-10-CM

## 2018-05-03 DIAGNOSIS — M5387 Other specified dorsopathies, lumbosacral region: Secondary | ICD-10-CM | POA: Diagnosis not present

## 2018-05-03 DIAGNOSIS — R5383 Other fatigue: Secondary | ICD-10-CM

## 2018-05-03 DIAGNOSIS — M48062 Spinal stenosis, lumbar region with neurogenic claudication: Secondary | ICD-10-CM

## 2018-05-03 DIAGNOSIS — S32030S Wedge compression fracture of third lumbar vertebra, sequela: Secondary | ICD-10-CM | POA: Diagnosis not present

## 2018-05-03 DIAGNOSIS — Z87442 Personal history of urinary calculi: Secondary | ICD-10-CM

## 2018-05-03 DIAGNOSIS — I252 Old myocardial infarction: Secondary | ICD-10-CM

## 2018-05-03 DIAGNOSIS — Z96651 Presence of right artificial knee joint: Secondary | ICD-10-CM

## 2018-05-03 DIAGNOSIS — I10 Essential (primary) hypertension: Secondary | ICD-10-CM

## 2018-05-03 DIAGNOSIS — M8589 Other specified disorders of bone density and structure, multiple sites: Secondary | ICD-10-CM | POA: Diagnosis not present

## 2018-05-03 DIAGNOSIS — Z8639 Personal history of other endocrine, nutritional and metabolic disease: Secondary | ICD-10-CM

## 2018-05-03 NOTE — Patient Instructions (Signed)
Abaloparatide injection What is this medicine? ABALOPARATIDE (a ball oh PAR a tide) increases bone mass and strength. It helps make healthy bone and to slow bone loss. This medicine is used to prevent bone fracture. This medicine may be used for other purposes; ask your health care provider or pharmacist if you have questions. COMMON BRAND NAME(S): TYMLOS What should I tell my health care provider before I take this medicine? They need to know if you have any of these conditions: -bone disease other than osteoporosis -high levels of calcium in the blood -high levels of an enzyme called alkaline phosphatase in the blood -history of cancer in the bone -kidney stone -Paget's disease -parathyroid disease -receiving radiation therapy -an unusual or allergic reaction to abaloparatide, other medicines, foods, dyes, or preservatives -pregnant or trying to get pregnant -breast-feeding How should I use this medicine? This medicine is for injection under the skin. You will be taught how to prepare and give this medicine. Use exactly as directed. Take your medicine at regular intervals. Do not take your medicine more often than directed. It is important that you put your used needles and pens in a special sharps container. Do not put them in a trash can. If you do not have a sharps container, call your pharmacist or health care provider to get one. A special MedGuide will be given to you by the pharmacist with each prescription and refill. Be sure to read this information carefully each time. Talk to your pediatrician regarding the use of this medicine in children. Special care may be needed. Overdosage: If you think you have taken too much of this medicine contact a poison control center or emergency room at once. NOTE: This medicine is only for you. Do not share this medicine with others. What if I miss a dose? If you miss a dose, take it as soon as you can. If it is almost time for your next dose,  take only that dose. Do not take double or extra doses. What may interact with this medicine? Interactions have not been studied. This list may not describe all possible interactions. Give your health care provider a list of all the medicines, herbs, non-prescription drugs, or dietary supplements you use. Also tell them if you smoke, drink alcohol, or use illegal drugs. Some items may interact with your medicine. What should I watch for while using this medicine? Visit your doctor or health care professional for regular checks on your progress. You may need blood work done while you are taking this medicine. You may get dizzy. Do not drive, use machinery, or do anything that needs mental alertness until you know how this medicine affects you. Do not stand or sit up quickly, especially if you are an older patient. This reduces the risk of dizzy or fainting spells. Avoid alcoholic drinks; they can make you more dizzy. You should make sure that you get enough calcium and vitamin D while you are taking this medicine. Discuss the foods you eat and the vitamins you take with your health care professional. Talk to your doctor about your risk of cancer. You may be more at risk for certain types of cancers if you take this medicine. What side effects may I notice from receiving this medicine? Side effects that you should report to your doctor or health care professional as soon as possible: -allergic reactions like skin rash, itching or hives, swelling of the face, lips, or tongue -blood in the urine; pain in the lower back   or side; pain when urinating -signs and symptoms of low blood pressure like dizziness; feeling faint or lightheaded, falls; unusually weak or tired -signs and symptoms of increased calcium like nausea; vomiting; constipation; low energy; or muscle weakness Side effects that usually do not require medical attention (report these to your doctor or health care professional if they continue or  are bothersome): -headache -fast, irregular heartbeat -nausea -pain, redness, irritation or swelling at the injection site -stomach upset or pain -tiredness This list may not describe all possible side effects. Call your doctor for medical advice about side effects. You may report side effects to FDA at 1-800-FDA-1088. Where should I keep my medicine? Keep out of the reach of children. Store unopened pens in the refrigerator between 2 and 8 degrees C (36 and 46 degrees F). Do not freeze. After first use, store your pen for up to 30 days at room temperature between 20 and 25 degrees C (68 and 77 degrees F). Avoid exposure to heat. Throw away any unused medicine after the expiration date on the label. NOTE: This sheet is a summary. It may not cover all possible information. If you have questions about this medicine, talk to your doctor, pharmacist, or health care provider.  2018 Elsevier/Gold Standard (2016-01-03 10:03:05)  

## 2018-05-03 NOTE — Telephone Encounter (Signed)
Received a Prior Authorization request from [employer] for [employer]. Authorization has been submitted to patient's insurance via Cover My Meds. Will update once we receive a response.  10:18 AM Dorthula Nettles, CPhT

## 2018-05-03 NOTE — Telephone Encounter (Signed)
Received a fax from [employer] regarding Prior Authorization for [employer]. Authorization has been DENIED because Tymlos is not indicated for use in males, only for postmenopausal women.   Will apply to Forteo.  Will send document to scan center.  Phone# [phone]  3:18 PM Dorthula Nettles, CPhT

## 2018-05-06 ENCOUNTER — Telehealth: Payer: Self-pay | Admitting: Pharmacy Technician

## 2018-05-06 LAB — COMPLETE METABOLIC PANEL WITH GFR
AG RATIO: 1.3 (calc) (ref 1.0–2.5)
ALT: 12 U/L (ref 9–46)
AST: 19 U/L (ref 10–35)
Albumin: 4.1 g/dL (ref 3.6–5.1)
Alkaline phosphatase (APISO): 126 U/L — ABNORMAL HIGH (ref 40–115)
BILIRUBIN TOTAL: 1.6 mg/dL — AB (ref 0.2–1.2)
BUN: 19 mg/dL (ref 7–25)
CO2: 30 mmol/L (ref 20–32)
Calcium: 9.7 mg/dL (ref 8.6–10.3)
Chloride: 103 mmol/L (ref 98–110)
Creat: 0.91 mg/dL (ref 0.70–1.11)
GFR, EST AFRICAN AMERICAN: 91 mL/min/{1.73_m2} (ref >=60)
GFR, EST NON AFRICAN AMERICAN: 79 mL/min/{1.73_m2} (ref >=60)
GLOBULIN: 3.1 g/dL (ref 1.9–3.7)
Glucose, Bld: 119 mg/dL — ABNORMAL HIGH (ref 65–99)
POTASSIUM: 4.8 mmol/L (ref 3.5–5.3)
Sodium: 141 mmol/L (ref 135–146)
TOTAL PROTEIN: 7.2 g/dL (ref 6.1–8.1)

## 2018-05-06 LAB — CBC WITH DIFFERENTIAL/PLATELET
BASOS PCT: 0.8 %
Basophils Absolute: 75 cells/uL (ref 0–200)
EOS ABS: 113 {cells}/uL (ref 15–500)
Eosinophils Relative: 1.2 %
HEMATOCRIT: 37.8 % — AB (ref 38.5–50.0)
Hemoglobin: 12.5 g/dL — ABNORMAL LOW (ref 13.2–17.1)
LYMPHS ABS: 1457 {cells}/uL (ref 850–3900)
MCH: 30.5 pg (ref 27.0–33.0)
MCHC: 33.1 g/dL (ref 32.0–36.0)
MCV: 92.2 fL (ref 80.0–100.0)
MPV: 10.6 fL (ref 7.5–12.5)
Monocytes Relative: 14.4 %
NEUTROS PCT: 68.1 %
Neutro Abs: 6401 cells/uL (ref 1500–7800)
PLATELETS: 252 10*3/uL (ref 140–400)
RBC: 4.1 10*6/uL — AB (ref 4.20–5.80)
RDW: 13.6 % (ref 11.0–15.0)
TOTAL LYMPHOCYTE: 15.5 %
WBC: 9.4 10*3/uL (ref 3.8–10.8)
WBCMIX: 1354 {cells}/uL — AB (ref 200–950)

## 2018-05-06 LAB — SEDIMENTATION RATE: Sed Rate: 36 mm/h — ABNORMAL HIGH (ref 0–20)

## 2018-05-06 LAB — PARATHYROID HORMONE, INTACT (NO CA): PTH: 44 pg/mL (ref 14–64)

## 2018-05-06 LAB — TSH: TSH: 1.05 mIU/L (ref 0.40–4.50)

## 2018-05-06 LAB — PHOSPHORUS: Phosphorus: 3.4 mg/dL (ref 2.1–4.3)

## 2018-05-06 LAB — TESTOSTERONE: Testosterone: 426 ng/dL (ref 250–827)

## 2018-05-06 NOTE — Progress Notes (Signed)
We applied for Tymlos for compression fracture

## 2018-05-06 NOTE — Telephone Encounter (Signed)
Received a Prior Authorization request from Dr. Corliss Skains for Forteo. Authorization has been submitted to patient's insurance via Cover My Meds. Will update once we receive a response.  8:58 AM Dorthula Nettles, CPhT

## 2018-05-07 NOTE — Telephone Encounter (Signed)
Will they approve Forteo?

## 2018-05-07 NOTE — Telephone Encounter (Signed)
This was the denial for Forteo. They also denied Tymlos.

## 2018-05-07 NOTE — Telephone Encounter (Signed)
Received a fax regarding Prior Authorization for Forteo. Authorization has been DENIED because Plan only covers FDA accepted medical conditions. FDA has not approved for medication to treat wedge compression fracture or other specified disorders of bone density and structure. These are considered off label use.  Will send document to scan center.  Phone# [phone]  Tymlos was also denied, please advise next steps.  9:17 AM Dorthula Nettles, CPhT

## 2018-05-08 NOTE — Telephone Encounter (Signed)
If Forteo and Tymlos both are denied then IV Reclast will be the option.  Call patient and his wife and discussed indications and the side effects of the Reclast.  They are okay to proceed with the IV Reclast.  Please apply for it.

## 2018-05-09 ENCOUNTER — Telehealth: Payer: Self-pay | Admitting: Pharmacy Technician

## 2018-05-09 NOTE — Telephone Encounter (Signed)
Attempted to contact the patient and left message for patient to call the office.  

## 2018-05-09 NOTE — Telephone Encounter (Signed)
Called pts insurance to check coverage on RECLAST. Spoke with Gaylord ShihSarah D. who states that the patient's benefits are active, and infusion will not require a prior certification. He will not have to pay a deductible because we are an in-network provider. He is responsible for 20% of the cost of the medication and service. Patient does not have a supplement insurance. Medication is covered with appropriate FDA approved diagnosis code.  Ref# [id number] Phone# [phone]  9:13 AM Dorthula Nettlesachael N Kem Parcher, CPhT

## 2018-05-09 NOTE — Telephone Encounter (Signed)
Patient has not tried any bisphosphonates.  He may try Fosamax 70 mg p.o. weekly.  Please discuss side effects.  If he is willing to take the medication we can call in the prescription for 30-day supply with 5 refills.  He will need blood work in 1 month and then every 6 months.

## 2018-05-10 ENCOUNTER — Ambulatory Visit (INDEPENDENT_AMBULATORY_CARE_PROVIDER_SITE_OTHER): Payer: Medicare HMO | Admitting: Family Medicine

## 2018-05-10 ENCOUNTER — Ambulatory Visit (INDEPENDENT_AMBULATORY_CARE_PROVIDER_SITE_OTHER): Payer: Medicare HMO

## 2018-05-10 VITALS — BP 149/77 | HR 65 | Ht 74.0 in | Wt 219.0 lb

## 2018-05-10 DIAGNOSIS — M545 Low back pain, unspecified: Secondary | ICD-10-CM

## 2018-05-10 MED ORDER — BACLOFEN 10 MG PO TABS: 5.0000 mg | ORAL_TABLET | Freq: Three times a day (TID) | ORAL | 3 refills | Status: AC | PRN

## 2018-05-10 NOTE — Progress Notes (Signed)
Office Visit Note   Patient: Jeffrey Nelson           Date of Birth: 1935-08-21           MRN: [id number] Visit Date: 05/10/2018 Requested by: Deloris Pingyter-Brown, Sherry M, MD [address] [place], [place] [id number] PCP: Deloris Pingyter-Brown, Sherry M, MD  Subjective: Chief Complaint  Patient presents with  . Lower Back - Pain    Patient here today with low back pain. Had recent TIA this past weekend. Since laying flat with wedge under his legs, he states has caused quite a bit of pain.     HPI: He is here with worsening low back pain.  He has been dealing with an L3 compression fracture, but last week he was hospitalized for a TIA and while he was lying down getting a CT scan, the technician lifted up his legs and he felt sudden excruciating pain in his midline lower back.  It has continued to hurt severely since then.  No radicular symptoms.  Tylenol does not seem to be helping.              ROS: He has controlled diabetes.  He is a non-smoker.  He is taking vitamin D2 for his deficiency.  All other systems were negative.  Objective: Vital Signs: BP (!) 149/77   Pulse 65   Ht 6\' 2"  (1.88 m)   Wt 219 lb (99.3 kg)   BMI 28.12 kg/m   Physical Exam:  Back: Exquisitely tender near the L3 and L4 spinous processes.  A little bit of paraspinous muscle spasm.  No pain in the sciatic notch, straight leg raise is negative.  Lower extremity strength and reflexes are normal.  Imaging: Lumbar spine x-rays: L3 compression fracture appears to be unchanged.  The surgical hardware is in similar position to x-rays from August 28.  I do not see any new compression deformity.  Assessment & Plan: 1.  Acute on chronic low back pain, suspect aggravation of pre-existing L3 compression fracture -Muscle relaxant as needed.  Per patient request I will ask Dr. Otelia SergeantNitka to review his films as well.  If he fails to improve, then possibly additional imaging with MRI scan or three-phase bone  scan.   Follow-Up Instructions: No follow-ups on file.       Procedures: None today.   PMFS History: Patient Active Problem List   Diagnosis Date Noted  . Lumbosacral radiculopathy at L3   . Tachycardia 12/14/2017  . Leukocytosis 12/14/2017  . Anemia due to blood loss 12/13/2017    Class: Chronic  . Syncope 12/03/2017  . Dehydration 12/03/2017  . Chronic back pain 12/03/2017  . Hypotension 12/03/2017  . Subclavian artery stenosis, right (HCC) 12/03/2017  . Status post lumbar laminectomy 11/12/2017  . Postoperative pain after spinal surgery 11/11/2017  . Weakness of right lower extremity   . Sciatica associated with disorder of lumbosacral spine   . Surgery, elective   . Diabetes mellitus type 2, uncomplicated (HCC) 10/14/2017    Class: Chronic  . Lumbar disc herniation with radiculopathy 10/13/2017    Class: Acute  . Radiculopathy, lumbar region 10/09/2017  . Pain in right hip 10/09/2017    Class: Acute  . Right knee sprain 10/09/2017    Class: Acute  . Hip pain, acute, right 10/09/2017  . Bradycardia 10/09/2017  . Spondylolisthesis, lumbar region 10/05/2017  . Knee contusion 07/24/2016  . Spinal stenosis in cervical region 05/14/2015    Class: Chronic  .  Cubital tunnel syndrome on left 05/14/2015    Class: Chronic  . Stenosis of cervical spine with myelopathy (HCC) 05/14/2015  . Spinal stenosis, lumbar region, with neurogenic claudication 01/16/2014    Class: Chronic   Past Medical History:  Diagnosis Date  . Anginal pain (HCC)    occ  . Arthritis    "joints" (10/09/2017)  . Chronic lower back pain   . Complication of anesthesia    extremely claustrophobic; "have to be put down for MRI; don't sit in back seat of car;, etc."  . Coronary artery disease   . High cholesterol    "get shots twice/month" (10/09/2017)  . History of blood transfusion 2001   "related to CABG"  . History of kidney stones   . HOH (hard of hearing)    wearing hearing aids  .  Hypertension   . Myocardial infarction (HCC) 2000s-06/2017 X 4  . Stroke [facility]) ~ 2015   "in my right eye; sight is coming back little by little" (10/09/2017)  . Type II diabetes mellitus (HCC)     Family History  Problem Relation Age of Onset  . CAD Brother   . Cancer Brother   . Alzheimer's disease Mother   . Healthy Daughter   . Healthy Daughter   . Diabetes Neg Hx     Past Surgical History:  Procedure Laterality Date  . ANTERIOR CERVICAL DECOMP/DISCECTOMY FUSION N/A 05/14/2015   Procedure: C3-4  ANTERIOR CERVICAL DISCECTOMY AND FUSION WITH PLATE AND SCREWS, LOCAL AND ALLOGRAFT BONE GRAFT;  Surgeon: Kerrin Champagne, MD;  Location: [facility] OR;  Service: Orthopedics;  Laterality: N/A;  . BACK SURGERY    . CARDIAC CATHETERIZATION     X 17 (10/09/2017)  . CATARACT EXTRACTION W/ INTRAOCULAR LENS  IMPLANT, BILATERAL Bilateral   . COLONOSCOPY    . CORONARY ANGIOPLASTY  06/2017  . CORONARY ANGIOPLASTY WITH STENT PLACEMENT     "6 stents total" (10/09/2017)  . CORONARY ARTERY BYPASS GRAFT  2001   CABG x4   . CYSTOSCOPY W/ STONE MANIPULATION    . JOINT REPLACEMENT    . LUMBAR LAMINECTOMY Right 10/13/2017   Procedure: MICRODISCECTOMY LUMBAR LAMINECTOMY RIGHT L3-4;  Surgeon: Kerrin Champagne, MD;  Location[facility] OR;  Service: Orthopedics;  Laterality: Right;  . LUMBAR LAMINECTOMY Left 11/12/2017   Procedure: MICRODISCECTOMY L3-4 FOR RECURRENT HNP POSSIBLE FLURO;  Surgeon: Kerrin Champagne, MD;  Location: [facility] OR;  Service: Orthopedics;  Laterality: Left;  . LUMBAR LAMINECTOMY/DECOMPRESSION MICRODISCECTOMY N/A 01/16/2014   Procedure: Left L3-4 and L4-5 foraminotomies;  Surgeon: Kerrin Champagne, MD;  Location: [facility] OR;  Service: Orthopedics;  Laterality: N/A;  . NASAL FRACTURE SURGERY    . NASAL SINUS SURGERY     "several times since 1959"  . RADIOLOGY WITH ANESTHESIA N/A 10/12/2017   Procedure: MRI WITH ANESTHESIA;  Surgeon: Radiologist, Medication, MD;  Location: [facility] OR;  Service: Radiology;  Laterality: N/A;  .  RADIOLOGY WITH ANESTHESIA N/A 12/05/2017   Procedure: MRI WITH ANESTHESIA;  Surgeon: Radiologist, Medication, MD;  Location: [facility] OR;  Service: Radiology;  Laterality: N/A;  . SHOULDER OPEN ROTATOR CUFF REPAIR Bilateral   . TOTAL KNEE ARTHROPLASTY Right 2012  . TYMPANOSTOMY TUBE PLACEMENT Bilateral   . ULNAR NERVE TRANSPOSITION Left 05/14/2015   Procedure: LEFT ULNAR NERVE DECOMPRESSION AT THE ELBOW;  Surgeon: Kerrin Champagne, MD;  Location: [facility] OR;  Service: Orthopedics;  Laterality: Left;   Social History   Occupational History  . Not on file  Tobacco Use  . Smoking status: Former Smoker    Packs/day: 1.00    Years: 2.00    Pack years: 2.00    Last attempt to quit: 01/09/1957    Years since quitting: 61.3  . Smokeless tobacco: Never Used  Substance and Sexual Activity  . Alcohol use: No  . Drug use: Never  . Sexual activity: Not Currently

## 2018-05-13 MED ORDER — ALENDRONATE SODIUM 70 MG PO TABS: 70.0000 mg | ORAL_TABLET | ORAL | 5 refills | Status: DC

## 2018-05-13 NOTE — Telephone Encounter (Signed)
Spoke with patient and his wife. Went over side effects, dosage instructions and lab schedule with them. They are in agreement for Jeffrey Nelson to start Fosamax and prescription sent to the pharmacy.

## 2018-05-13 NOTE — Addendum Note (Signed)
Addended by: Henriette CombsHATTON, Eldana Isip L on: 05/13/2018 10:47 AM   Modules accepted: Orders

## 2018-05-14 ENCOUNTER — Ambulatory Visit (INDEPENDENT_AMBULATORY_CARE_PROVIDER_SITE_OTHER): Payer: Medicare HMO | Admitting: Specialist

## 2018-05-14 ENCOUNTER — Telehealth (INDEPENDENT_AMBULATORY_CARE_PROVIDER_SITE_OTHER): Payer: Self-pay

## 2018-05-14 ENCOUNTER — Ambulatory Visit (HOSPITAL_BASED_OUTPATIENT_CLINIC_OR_DEPARTMENT_OTHER)
Admission: RE | Admit: 2018-05-14 | Discharge: 2018-05-14 | Disposition: A | Discharge status: Home / Self Care | Payer: Medicare HMO | Source: Ambulatory Visit | Attending: Specialist | Admitting: Specialist

## 2018-05-14 ENCOUNTER — Encounter (INDEPENDENT_AMBULATORY_CARE_PROVIDER_SITE_OTHER): Payer: Self-pay | Admitting: Specialist

## 2018-05-14 VITALS — BP 126/62 | HR 68 | Ht 74.0 in | Wt 219.0 lb

## 2018-05-14 DIAGNOSIS — T84038A Mechanical loosening of other internal prosthetic joint, initial encounter: Secondary | ICD-10-CM | POA: Insufficient documentation

## 2018-05-14 DIAGNOSIS — Z981 Arthrodesis status: Secondary | ICD-10-CM | POA: Diagnosis not present

## 2018-05-14 DIAGNOSIS — M5136 Other intervertebral disc degeneration, lumbar region: Secondary | ICD-10-CM | POA: Diagnosis not present

## 2018-05-14 DIAGNOSIS — M8000XG Age-related osteoporosis with current pathological fracture, unspecified site, subsequent encounter for fracture with delayed healing: Secondary | ICD-10-CM

## 2018-05-14 NOTE — Patient Instructions (Signed)
Plan: Avoid frequent bending and stooping  No lifting greater than 10 lbs. May use ice or moist heat for pain. Weight loss is of benefit. I am concern that you are having increasing back pain that is mechanical and related to movement. We know there is  Weakened bone density or osteoporosis and screw migration on xrays before [facility]. CT Scan will tell us if there is an acute change in the screw pattern or new bone injury. Use your lumbar brace to see if it decreases the pain.

## 2018-05-14 NOTE — Telephone Encounter (Signed)
[facility] with call report for patients CT scan of his lumbar spine.  I printed report and gave to [facility] to make sure shown to Dr Otelia SergeantNitka ASAP.

## 2018-05-14 NOTE — Progress Notes (Signed)
Office Visit Note   Patient: Jeffrey Nelson           Date of Birth: Jan 17, 1935           MRN: [id number] Visit Date: 05/14/2018              Requested by: Deloris Ping, MD [address] [place], [place] [address] PCP: Deloris Ping, MD   Assessment & Plan: Visit Diagnoses:  1. Age-related osteoporosis with current pathological fracture with delayed healing, subsequent encounter   2. Other intervertebral disc degeneration, lumbar region     Plan: Avoid frequent bending and stooping  No lifting greater than 10 lbs. May use ice or moist heat for pain. Weight loss is of benefit. I am concern that you are having increasing back pain that is mechanical and related to movement. We know there is  Weakened bone density or osteoporosis and screw migration on xrays before the incident at the [facility]. CT Scan will tell us if there is an acute change in the screw pattern or new bone injury. Use your lumbar brace to see if it decreases the pain.    Follow-Up Instructions: Return in about 4 weeks (around 06/11/2018).   Orders:  No orders of the defined types were placed in this encounter.  No orders of the defined types were placed in this encounter.     Procedures: No procedures performed   Clinical Data: No additional findings.   Subjective: Chief Complaint  Patient presents with  . Lower Back - Pain    83 year old male with history of lumbar fusion L3-4 and L4-5 with pedicle screws and rods with cages for collapsing disc disease with severe foramenal stenosis. He underwent decompressions and then eventually a fusion in 12/11/2017. He has been doing well and unfortunately had a mini stroke this past 05/04/2018 for which he was seen in the [facility], after 4 hours he was seen. Admitted to [facility] under the internal medicine service. Was seen after threatened to leave the hospital to go to [facility]. He has seen  Dr.Deveshwar about 2 weeks ago to undergo reclast for osteoporosis and is having  Difficulty with getting insurance authorization. His legs feel strong but he is having severe low back pain  Since having had his leg elevated while being transferred into the CT scanner at [facility]. He was apparently getting up onto the CT scanner table and the CT technologist raised both of his feet at once  Causing him immediate pain in the lumbar spine. Then they did a contrast CT scan of the brain next.  The brain study show right sided focus indicating a lesion. The pain in the back is constant and worse with moving. Feels like some one is stabbing me. I holler when I go to move. No bowel or bladder difficulty. His Blood sugar is normal and his reportedly off all metformin. He is using honey as a sweetener.    Review of Systems  Constitutional: Positive for activity change and unexpected weight change. Negative for appetite change, chills, diaphoresis, fatigue and fever.  HENT: Positive for hearing loss. Negative for congestion, dental problem, drooling, ear discharge, ear pain, facial swelling, mouth sores, nosebleeds, postnasal drip, rhinorrhea, sinus pressure, sinus pain, sneezing, sore throat, tinnitus, trouble swallowing and voice change.   Eyes: Negative.  Negative for photophobia, pain, discharge, redness, itching and visual disturbance.  Respiratory: Negative.  Negative for apnea, cough, choking, chest tightness,  shortness of breath, wheezing and stridor.   Cardiovascular: Negative for chest pain, palpitations and leg swelling.  Gastrointestinal: Negative for abdominal distention, abdominal pain, anal bleeding, blood in stool, constipation, diarrhea, nausea, rectal pain and vomiting.  Endocrine: Negative for cold intolerance, heat intolerance, polydipsia, polyphagia and polyuria.  Genitourinary: Negative for difficulty urinating, dysuria, enuresis, flank pain, frequency, genital sores and hematuria.    Musculoskeletal: Positive for back pain. Negative for arthralgias, gait problem, joint swelling, myalgias, neck pain and neck stiffness.  Skin: Negative for color change, pallor, rash and wound.  Allergic/Immunologic: Negative for environmental allergies, food allergies and immunocompromised state.  Neurological: Positive for weakness and numbness. Negative for dizziness, tremors, seizures, syncope, facial asymmetry, speech difficulty, light-headedness and headaches.  Hematological: Negative for adenopathy. Does not bruise/bleed easily.  Psychiatric/Behavioral: Positive for sleep disturbance. Negative for agitation, behavioral problems, confusion, decreased concentration, dysphoric mood, hallucinations, self-injury and suicidal ideas. The patient is not nervous/anxious and is not hyperactive.      Objective: Vital Signs: BP 126/62 (BP Location: Left Arm, Patient Position: Sitting)   Pulse 68   Ht 6\' 2"  (1.88 m)   Wt 219 lb (99.3 kg)   BMI 28.12 kg/m   Physical Exam  Constitutional: He is oriented to person, place, and time. He appears well-developed and well-nourished.  HENT:  Head: Normocephalic and atraumatic.  Eyes: Pupils are equal, round, and reactive to light. EOM are normal.  Neck: Normal range of motion. Neck supple.  Pulmonary/Chest: Effort normal and breath sounds normal.  Abdominal: Soft. Bowel sounds are normal.  Musculoskeletal: Normal range of motion.  Neurological: He is alert and oriented to person, place, and time.  Skin: Skin is warm and dry.  Psychiatric: He has a normal mood and affect. His behavior is normal. Judgment and thought content normal.    Back Exam   Tenderness  The patient is experiencing tenderness in the lumbar.  Range of Motion  Extension: normal  Flexion: normal  Lateral bend right: normal  Lateral bend left: normal  Rotation right: normal  Rotation left: normal   Muscle Strength  Right Quadriceps:  5/5  Left Quadriceps:  5/5  Right  Hamstrings:  5/5  Left Hamstrings:  5/5   Tests  Straight leg raise right: negative Straight leg raise left: negative  Reflexes  Patellar: 0/4 Achilles: 0/4 Babinski's sign: normal   Other  Toe walk: normal Heel walk: normal Sensation: normal Gait: normal  Erythema: no back redness Scars: absent      Specialty Comments:  No specialty comments available.  Imaging: No results found.   PMFS History: Patient Active Problem List   Diagnosis Date Noted  . Anemia due to blood loss 12/13/2017    Priority: High    Class: Chronic  . Lumbar disc herniation with radiculopathy 10/13/2017    Priority: High    Class: Acute  . Radiculopathy, lumbar region 10/09/2017    Priority: High  . Pain in right hip 10/09/2017    Priority: High    Class: Acute  . Right knee sprain 10/09/2017    Priority: High    Class: Acute  . Spinal stenosis in cervical region 05/14/2015    Priority: High    Class: Chronic  . Cubital tunnel syndrome on left 05/14/2015    Priority: High    Class: Chronic  . Spinal stenosis, lumbar region, with neurogenic claudication 01/16/2014    Priority: High    Class: Chronic  . Diabetes mellitus type 2, uncomplicated (HCC) 10/14/2017  Priority: Medium    Class: Chronic  . Lumbosacral radiculopathy at L3   . Tachycardia 12/14/2017  . Leukocytosis 12/14/2017  . Syncope 12/03/2017  . Dehydration 12/03/2017  . Chronic back pain 12/03/2017  . Hypotension 12/03/2017  . Subclavian artery stenosis, right (HCC) 12/03/2017  . Status post lumbar laminectomy 11/12/2017  . Postoperative pain after spinal surgery 11/11/2017  . Weakness of right lower extremity   . Sciatica associated with disorder of lumbosacral spine   . Surgery, elective   . Hip pain, acute, right 10/09/2017  . Bradycardia 10/09/2017  . Spondylolisthesis, lumbar region 10/05/2017  . Knee contusion 07/24/2016  . Stenosis of cervical spine with myelopathy (HCC) 05/14/2015   Past Medical  History:  Diagnosis Date  . Anginal pain (HCC)    occ  . Arthritis    "joints" (10/09/2017)  . Chronic lower back pain   . Complication of anesthesia    extremely claustrophobic; "have to be put down for MRI; don't sit in back seat of car;, etc."  . Coronary artery disease   . High cholesterol    "get shots twice/month" (10/09/2017)  . History of blood transfusion 2001   "related to CABG"  . History of kidney stones   . HOH (hard of hearing)    wearing hearing aids  . Hypertension   . Myocardial infarction (HCC) 2000s-06/2017 X 4  . Stroke [facility]) ~ 2015   "in my right eye; sight is coming back little by little" (10/09/2017)  . Type II diabetes mellitus (HCC)     Family History  Problem Relation Age of Onset  . CAD Brother   . Cancer Brother   . Alzheimer's disease Mother   . Healthy Daughter   . Healthy Daughter   . Diabetes Neg Hx     Past Surgical History:  Procedure Laterality Date  . ANTERIOR CERVICAL DECOMP/DISCECTOMY FUSION N/A 05/14/2015   Procedure: C3-4  ANTERIOR CERVICAL DISCECTOMY AND FUSION WITH PLATE AND SCREWS, LOCAL AND ALLOGRAFT BONE GRAFT;  Surgeon: Kerrin Champagne, MD;  Location: [facility] OR;  Service: Orthopedics;  Laterality: N/A;  . BACK SURGERY    . CARDIAC CATHETERIZATION     X 17 (10/09/2017)  . CATARACT EXTRACTION W/ INTRAOCULAR LENS  IMPLANT, BILATERAL Bilateral   . COLONOSCOPY    . CORONARY ANGIOPLASTY  06/2017  . CORONARY ANGIOPLASTY WITH STENT PLACEMENT     "6 stents total" (10/09/2017)  . CORONARY ARTERY BYPASS GRAFT  2001   CABG x4   . CYSTOSCOPY W/ STONE MANIPULATION    . JOINT REPLACEMENT    . LUMBAR LAMINECTOMY Right 10/13/2017   Procedure: MICRODISCECTOMY LUMBAR LAMINECTOMY RIGHT L3-4;  Surgeon: Kerrin Champagne, MD;  Location: [facility] OR;  Service: Orthopedics;  Laterality: Right;  . LUMBAR LAMINECTOMY Left 11/12/2017   Procedure: MICRODISCECTOMY L3-4 FOR RECURRENT HNP POSSIBLE FLURO;  Surgeon: Kerrin Champagne, MD;  Location: [facility] OR;  Service: Orthopedics;   Laterality: Left;  . LUMBAR LAMINECTOMY/DECOMPRESSION MICRODISCECTOMY N/A 01/16/2014   Procedure: Left L3-4 and L4-5 foraminotomies;  Surgeon: Kerrin Champagne, MD;  Location: [facility] OR;  Service: Orthopedics;  Laterality: N/A;  . NASAL FRACTURE SURGERY    . NASAL SINUS SURGERY     "several times since 1959"  . RADIOLOGY WITH ANESTHESIA N/A 10/12/2017   Procedure: MRI WITH ANESTHESIA;  Surgeon: Radiologist, Medication, MD;  Location: [facility] OR;  Service: Radiology;  Laterality: N/A;  . RADIOLOGY WITH ANESTHESIA N/A 12/05/2017   Procedure: MRI WITH ANESTHESIA;  Surgeon: Radiologist,  Medication, MD;  Location: [facility] OR;  Service: Radiology;  Laterality: N/A;  . SHOULDER OPEN ROTATOR CUFF REPAIR Bilateral   . TOTAL KNEE ARTHROPLASTY Right 2012  . TYMPANOSTOMY TUBE PLACEMENT Bilateral   . ULNAR NERVE TRANSPOSITION Left 05/14/2015   Procedure: LEFT ULNAR NERVE DECOMPRESSION AT THE ELBOW;  Surgeon: Kerrin Champagne, MD;  Location: [facility] OR;  Service: Orthopedics;  Laterality: Left;   Social History   Occupational History  . Not on file  Tobacco Use  . Smoking status: Former Smoker    Packs/day: 1.00    Years: 2.00    Pack years: 2.00    Last attempt to quit: 01/09/1957    Years since quitting: 61.3  . Smokeless tobacco: Never Used  Substance and Sexual Activity  . Alcohol use: No  . Drug use: Never  . Sexual activity: Not Currently

## 2018-05-15 ENCOUNTER — Other Ambulatory Visit: Payer: Self-pay | Admitting: Pharmacist

## 2018-05-15 NOTE — Progress Notes (Signed)
[facility] along with provider notes.  Will update when we receive a response.

## 2018-05-16 NOTE — Telephone Encounter (Signed)
Appeal for [employer] [facility] was APPROVED through patient's plan. Authorization dates are 05/16/18 to 05/15/20.   Will fax document to scan center.  Ran test claim, patient's copay is $1,080.22/ month. Called patient to discuss patient assistance programs, left message to call back.  Approval# [id number] Phone# [phone]  10:57 AM Dorthula Nettlesachael N Annarae Macnair, CPhT

## 2018-05-17 NOTE — Telephone Encounter (Signed)
Spoke to patient's wife, they will bring by documentation at patient's next office visit or earlier if they come to [place] before then. Will start application and hold for patient.  12:13 PM Dorthula Nettlesachael N Broxton Broady, CPhT

## 2018-05-29 ENCOUNTER — Ambulatory Visit (INDEPENDENT_AMBULATORY_CARE_PROVIDER_SITE_OTHER): Payer: Self-pay

## 2018-05-29 ENCOUNTER — Ambulatory Visit (INDEPENDENT_AMBULATORY_CARE_PROVIDER_SITE_OTHER): Payer: Medicare HMO | Admitting: Specialist

## 2018-05-29 ENCOUNTER — Encounter (INDEPENDENT_AMBULATORY_CARE_PROVIDER_SITE_OTHER): Payer: Self-pay | Admitting: Specialist

## 2018-05-29 VITALS — BP 133/65 | HR 63 | Ht 74.0 in | Wt 219.0 lb

## 2018-05-29 DIAGNOSIS — S32030G Wedge compression fracture of third lumbar vertebra, subsequent encounter for fracture with delayed healing: Secondary | ICD-10-CM | POA: Diagnosis not present

## 2018-05-29 DIAGNOSIS — M5136 Other intervertebral disc degeneration, lumbar region: Secondary | ICD-10-CM | POA: Diagnosis not present

## 2018-05-29 DIAGNOSIS — M4326 Fusion of spine, lumbar region: Secondary | ICD-10-CM | POA: Diagnosis not present

## 2018-05-29 DIAGNOSIS — M533 Sacrococcygeal disorders, not elsewhere classified: Secondary | ICD-10-CM

## 2018-05-29 MED ORDER — ZOLPIDEM TARTRATE 10 MG PO TABS: 10.0000 mg | ORAL_TABLET | Freq: Every evening | ORAL | 0 refills | Status: DC | PRN

## 2018-05-29 MED ORDER — TAPENTADOL HCL 50 MG PO TABS: 100.0000 mg | ORAL_TABLET | ORAL | Status: DC | PRN

## 2018-05-29 MED ORDER — TAPENTADOL HCL 100 MG PO TABS: ORAL_TABLET | ORAL | 0 refills | Status: AC

## 2018-05-29 NOTE — Addendum Note (Signed)
Addended by: [name] on: 05/29/2018 12:34 PM   Modules accepted: Orders

## 2018-05-29 NOTE — Patient Instructions (Signed)
Avoid frequent bending and stooping  No lifting greater than 10 lbs. May use ice or moist heat for pain. Weight loss is of benefit. Best medication for lumbar disc disease is arthritis medications like motrin, celebrex and naprosyn. Exercise is important to improve your indurance and does allow people to function better inspite of back pain.   

## 2018-05-29 NOTE — Progress Notes (Signed)
Office Visit Note   Patient: Jeffrey Nelson           Date of Birth: [birth year]           MRN: [id number] Visit Date: 05/29/2018              Requested by: Deloris Ping, MD [address] [place], [place] [address] PCP: Deloris Ping, MD   Assessment & Plan: Visit Diagnoses:  1. Other intervertebral disc degeneration, lumbar region   2. Fusion of lumbar spine   3. Sacrococcygeal disorders, not elsewhere classified   4. Compression fracture of L3 vertebra with delayed healing, subsequent encounter     Plan: Avoid frequent bending and stooping  No lifting greater than 10 lbs. May use ice or moist heat for pain. Weight loss is of benefit. Best medication for lumbar disc disease is arthritis medications like motrin, celebrex and naprosyn. Exercise is important to improve your indurance and does allow people to function better inspite of back pain.  Follow-Up Instructions: Return in about 3 weeks (around 06/19/2018).   Orders:  Orders Placed This Encounter  Procedures  . XR Lumbar Spine 2-3 Views  . NM Bone Scan 3 Phase  . Ambulatory referral to Interventional Radiology   Meds ordered this encounter  Medications  . tapentadol (NUCYNTA) tablet 100 mg  . zolpidem (AMBIEN) 10 MG tablet    Sig: Take 1 tablet (10 mg total) by mouth at bedtime as needed for sleep.    Dispense:  30 tablet    Refill:  0      Procedures: No procedures performed   Clinical Data: No additional findings.   Subjective: Chief Complaint  Patient presents with  . Lower Back - Follow-up    HPI  Review of Systems   Objective: Vital Signs: BP 133/65 (BP Location: Left Arm, Patient Position: Sitting)   Pulse 63   Ht 6\' 2"  (1.88 m)   Wt 219 lb (99.3 kg)   BMI 28.12 kg/m   Physical Exam  Ortho Exam  Specialty Comments:  No specialty comments available.  Imaging: No results found.   PMFS History: Patient Active Problem List   Diagnosis Date  Noted  . Anemia due to blood loss 12/13/2017    Priority: High    Class: Chronic  . Lumbar disc herniation with radiculopathy 10/13/2017    Priority: High    Class: Acute  . Radiculopathy, lumbar region 10/09/2017    Priority: High  . Pain in right hip 10/09/2017    Priority: High    Class: Acute  . Right knee sprain 10/09/2017    Priority: High    Class: Acute  . Spinal stenosis in cervical region 05/14/2015    Priority: High    Class: Chronic  . Cubital tunnel syndrome on left 05/14/2015    Priority: High    Class: Chronic  . Spinal stenosis, lumbar region, with neurogenic claudication 01/16/2014    Priority: High    Class: Chronic  . Diabetes mellitus type 2, uncomplicated (HCC) 10/14/2017    Priority: Medium    Class: Chronic  . Lumbosacral radiculopathy at L3   . Tachycardia 12/14/2017  . Leukocytosis 12/14/2017  . Syncope 12/03/2017  . Dehydration 12/03/2017  . Chronic back pain 12/03/2017  . Hypotension 12/03/2017  . Subclavian artery stenosis, right (HCC) 12/03/2017  . Status post lumbar laminectomy 11/12/2017  . Postoperative pain after spinal surgery 11/11/2017  . Weakness of right lower extremity   .  Sciatica associated with disorder of lumbosacral spine   . Surgery, elective   . Hip pain, acute, right 10/09/2017  . Bradycardia 10/09/2017  . Spondylolisthesis, lumbar region 10/05/2017  . Knee contusion 07/24/2016  . Stenosis of cervical spine with myelopathy (HCC) 05/14/2015   Past Medical History:  Diagnosis Date  . Anginal pain (HCC)    occ  . Arthritis    "joints" (10/09/2017)  . Chronic lower back pain   . Complication of anesthesia    extremely claustrophobic; "have to be put down for MRI; don't sit in back seat of car;, etc."  . Coronary artery disease   . High cholesterol    "get shots twice/month" (10/09/2017)  . History of blood transfusion 2001   "related to CABG"  . History of kidney stones   . HOH (hard of hearing)    wearing hearing  aids  . Hypertension   . Myocardial infarction (HCC) 2000s-06/2017 X 4  . Stroke [facility]) ~ 2015   "in my right eye; sight is coming back little by little" (10/09/2017)  . Type II diabetes mellitus (HCC)     Family History  Problem Relation Age of Onset  . CAD Brother   . Cancer Brother   . Alzheimer's disease Mother   . Healthy Daughter   . Healthy Daughter   . Diabetes Neg Hx     Past Surgical History:  Procedure Laterality Date  . ANTERIOR CERVICAL DECOMP/DISCECTOMY FUSION N/A 05/14/2015   Procedure: C3-4  ANTERIOR CERVICAL DISCECTOMY AND FUSION WITH PLATE AND SCREWS, LOCAL AND ALLOGRAFT BONE GRAFT;  Surgeon: Kerrin Champagne, MD;  Location: [facility] OR;  Service: Orthopedics;  Laterality: N/A;  . BACK SURGERY    . CARDIAC CATHETERIZATION     X 17 (10/09/2017)  . CATARACT EXTRACTION W/ INTRAOCULAR LENS  IMPLANT, BILATERAL Bilateral   . COLONOSCOPY    . CORONARY ANGIOPLASTY  06/2017  . CORONARY ANGIOPLASTY WITH STENT PLACEMENT     "6 stents total" (10/09/2017)  . CORONARY ARTERY BYPASS GRAFT  2001   CABG x4   . CYSTOSCOPY W/ STONE MANIPULATION    . JOINT REPLACEMENT    . LUMBAR LAMINECTOMY Right 10/13/2017   Procedure: MICRODISCECTOMY LUMBAR LAMINECTOMY RIGHT L3-4;  Surgeon: Kerrin Champagne, MD;  Location: [facility] OR;  Service: Orthopedics;  Laterality: Right;  . LUMBAR LAMINECTOMY Left 11/12/2017   Procedure: MICRODISCECTOMY L3-4 FOR RECURRENT HNP POSSIBLE FLURO;  Surgeon: Kerrin Champagne, MD;  Location: [facility] OR;  Service: Orthopedics;  Laterality: Left;  . LUMBAR LAMINECTOMY/DECOMPRESSION MICRODISCECTOMY N/A 01/16/2014   Procedure: Left L3-4 and L4-5 foraminotomies;  Surgeon: Kerrin Champagne, MD;  Location[facility] OR;  Service: Orthopedics;  Laterality: N/A;  . NASAL FRACTURE SURGERY    . NASAL SINUS SURGERY     "several times since 1959"  . RADIOLOGY WITH ANESTHESIA N/A 10/12/2017   Procedure: MRI WITH ANESTHESIA;  Surgeon: Radiologist, Medication, MD;  Location: [facility] OR;  Service: Radiology;  Laterality:  N/A;  . RADIOLOGY WITH ANESTHESIA N/A 12/05/2017   Procedure: MRI WITH ANESTHESIA;  Surgeon: Radiologist, Medication, MD;  Location: [facility] OR;  Service: Radiology;  Laterality: N/A;  . SHOULDER OPEN ROTATOR CUFF REPAIR Bilateral   . TOTAL KNEE ARTHROPLASTY Right 2012  . TYMPANOSTOMY TUBE PLACEMENT Bilateral   . ULNAR NERVE TRANSPOSITION Left 05/14/2015   Procedure: LEFT ULNAR NERVE DECOMPRESSION AT THE ELBOW;  Surgeon: Kerrin Champagne, MD;  Location: [facility] OR;  Service: Orthopedics;  Laterality: Left;   Social History  Occupational History  . Not on file  Tobacco Use  . Smoking status: Former Smoker    Packs/day: 1.00    Years: 2.00    Pack years: 2.00    Last attempt to quit: 01/09/1957    Years since quitting: 61.4  . Smokeless tobacco: Never Used  Substance and Sexual Activity  . Alcohol use: No  . Drug use: Never  . Sexual activity: Not Currently

## 2018-05-29 NOTE — Telephone Encounter (Signed)
Patient has appointment at [facility] office today, will stop by to complete [employer] Patient Assistance application.  9:43 AM Dorthula Nettles, CPhT

## 2018-05-31 ENCOUNTER — Telehealth: Payer: Self-pay | Admitting: Pharmacist

## 2018-05-31 ENCOUNTER — Other Ambulatory Visit: Payer: Self-pay | Admitting: Student-PharmD

## 2018-05-31 DIAGNOSIS — M8589 Other specified disorders of bone density and structure, multiple sites: Secondary | ICD-10-CM

## 2018-05-31 DIAGNOSIS — S32030S Wedge compression fracture of third lumbar vertebra, sequela: Secondary | ICD-10-CM

## 2018-05-31 MED ORDER — TERIPARATIDE (RECOMBINANT) 600 MCG/2.4ML ~~LOC~~ SOLN: 20.0000 ug | Freq: Every day | SUBCUTANEOUS | 1 refills | Status: DC

## 2018-05-31 NOTE — Telephone Encounter (Signed)
Faxed forms for [employer] patient  assistance program.  Will update when we receive a response.

## 2018-06-03 ENCOUNTER — Telehealth (INDEPENDENT_AMBULATORY_CARE_PROVIDER_SITE_OTHER): Payer: Self-pay | Admitting: Specialist

## 2018-06-03 NOTE — Telephone Encounter (Signed)
I called and lmom to advised that I have submitted info to [facility] and we are waiting to hear back from them.

## 2018-06-03 NOTE — Telephone Encounter (Signed)
Patient's wife called stating that the pharmacy told her that the medication Tapentadol has not been approved by the insurance and it would cost her over $300.00.  CB#[phone].  Thank you.

## 2018-06-04 ENCOUNTER — Telehealth (HOSPITAL_COMMUNITY): Payer: Self-pay

## 2018-06-04 ENCOUNTER — Telehealth (INDEPENDENT_AMBULATORY_CARE_PROVIDER_SITE_OTHER): Payer: Self-pay | Admitting: Radiology

## 2018-06-04 NOTE — Telephone Encounter (Signed)
Jeffrey Nelson with Dr. Jaynie Bream office, she states that he can not do the requested procedure due to him not being able to access the area of the spine do to the screws in the pedicles.  Jeffrey Nelson's call back number is [phone].

## 2018-06-04 NOTE — Telephone Encounter (Signed)
I called and advised Lupita Leash the Nucynta was approved.

## 2018-06-04 NOTE — Telephone Encounter (Signed)
Called Dr. Barbaraann Faster office to inform them that Dr. Corliss Skains has reviewed the pt's imaging and he would not be a candidate for the procedure. He has screws in his pedicle and he will not be able to access the area. I spoke with Christy from the office. She will let Dr. Otelia Sergeant know and they will call if they have any questions. AW

## 2018-06-06 ENCOUNTER — Encounter (HOSPITAL_COMMUNITY)
Admission: RE | Admit: 2018-06-06 | Discharge: 2018-06-06 | Disposition: A | Discharge status: Home / Self Care | Payer: Medicare HMO | Source: Ambulatory Visit | Attending: Specialist | Admitting: Specialist

## 2018-06-06 DIAGNOSIS — M533 Sacrococcygeal disorders, not elsewhere classified: Secondary | ICD-10-CM

## 2018-06-06 MED ORDER — TECHNETIUM TC 99M MEDRONATE IV KIT
20.0000 | PACK | Freq: Once | INTRAVENOUS | Status: AC | PRN
  Administered 2018-06-06: 20 via INTRAVENOUS

## 2018-06-07 NOTE — Telephone Encounter (Signed)
[facility] will be sending patient's PAP for [employer]- [phone]

## 2018-06-07 NOTE — Telephone Encounter (Signed)
Received approval for Patient Assistance from [facility] [facility] for [employer]. Patient has been approved for assistance through 08/27/18. They will reach out to patient to schedule shipment early next week.  Called patient to advise, left message. Patient needs to schedule new start visit.  Will send document to scan center.  Phone# [phone]  10:09 AM Dorthula Nettles, CPhT

## 2018-06-08 ENCOUNTER — Other Ambulatory Visit (INDEPENDENT_AMBULATORY_CARE_PROVIDER_SITE_OTHER): Payer: Self-pay | Admitting: Specialist

## 2018-06-10 ENCOUNTER — Telehealth: Payer: Self-pay | Admitting: Pharmacist

## 2018-06-10 DIAGNOSIS — S32030S Wedge compression fracture of third lumbar vertebra, sequela: Secondary | ICD-10-CM

## 2018-06-10 MED ORDER — TERIPARATIDE (RECOMBINANT) 600 MCG/2.4ML ~~LOC~~ SOLN: 20.0000 ug | Freq: Every day | SUBCUTANEOUS | 5 refills | Status: AC

## 2018-06-10 NOTE — Telephone Encounter (Signed)
Received fax from [facility] requesting [employer] prescription to be written for 3-4 month supply or provide documentation for reason of 1 fill.  Authorized 6 month fill.  Will send document to scan center

## 2018-06-10 NOTE — Telephone Encounter (Signed)
Gabapentin refill requst

## 2018-06-12 ENCOUNTER — Inpatient Hospital Stay (HOSPITAL_COMMUNITY)
Admission: EM | Admit: 2018-06-12 | Discharge: 2018-06-26 | DRG: 478 | Disposition: A | Discharge status: Home Health | Payer: Medicare HMO | Attending: Internal Medicine | Admitting: Internal Medicine

## 2018-06-12 ENCOUNTER — Ambulatory Visit (INDEPENDENT_AMBULATORY_CARE_PROVIDER_SITE_OTHER): Payer: Medicare HMO | Admitting: Specialist

## 2018-06-12 ENCOUNTER — Ambulatory Visit (INDEPENDENT_AMBULATORY_CARE_PROVIDER_SITE_OTHER): Payer: Self-pay

## 2018-06-12 ENCOUNTER — Emergency Department (HOSPITAL_COMMUNITY): Payer: Medicare HMO

## 2018-06-12 ENCOUNTER — Other Ambulatory Visit: Payer: Self-pay

## 2018-06-12 ENCOUNTER — Encounter (INDEPENDENT_AMBULATORY_CARE_PROVIDER_SITE_OTHER): Payer: Self-pay | Admitting: Specialist

## 2018-06-12 ENCOUNTER — Encounter (HOSPITAL_COMMUNITY): Payer: Self-pay | Admitting: *Deleted

## 2018-06-12 VITALS — BP 104/65 | HR 76 | Temp 98.7°F | Ht 74.0 in | Wt 219.0 lb

## 2018-06-12 DIAGNOSIS — Z9889 Other specified postprocedural states: Secondary | ICD-10-CM

## 2018-06-12 DIAGNOSIS — G992 Myelopathy in diseases classified elsewhere: Secondary | ICD-10-CM | POA: Diagnosis present

## 2018-06-12 DIAGNOSIS — Z791 Long term (current) use of non-steroidal anti-inflammatories (NSAID): Secondary | ICD-10-CM

## 2018-06-12 DIAGNOSIS — D649 Anemia, unspecified: Secondary | ICD-10-CM | POA: Diagnosis present

## 2018-06-12 DIAGNOSIS — M869 Osteomyelitis, unspecified: Secondary | ICD-10-CM

## 2018-06-12 DIAGNOSIS — N400 Enlarged prostate without lower urinary tract symptoms: Secondary | ICD-10-CM

## 2018-06-12 DIAGNOSIS — Z96651 Presence of right artificial knee joint: Secondary | ICD-10-CM | POA: Diagnosis present

## 2018-06-12 DIAGNOSIS — N401 Enlarged prostate with lower urinary tract symptoms: Secondary | ICD-10-CM | POA: Diagnosis present

## 2018-06-12 DIAGNOSIS — R410 Disorientation, unspecified: Secondary | ICD-10-CM | POA: Diagnosis not present

## 2018-06-12 DIAGNOSIS — F05 Delirium due to known physiological condition: Secondary | ICD-10-CM | POA: Diagnosis not present

## 2018-06-12 DIAGNOSIS — D62 Acute posthemorrhagic anemia: Secondary | ICD-10-CM

## 2018-06-12 DIAGNOSIS — Z820 Family history of epilepsy and other diseases of the nervous system: Secondary | ICD-10-CM

## 2018-06-12 DIAGNOSIS — T8460XD Infection and inflammatory reaction due to internal fixation device of unspecified site, subsequent encounter: Secondary | ICD-10-CM | POA: Diagnosis not present

## 2018-06-12 DIAGNOSIS — T84226A Displacement of internal fixation device of vertebrae, initial encounter: Secondary | ICD-10-CM | POA: Diagnosis present

## 2018-06-12 DIAGNOSIS — M545 Low back pain, unspecified: Secondary | ICD-10-CM

## 2018-06-12 DIAGNOSIS — M4802 Spinal stenosis, cervical region: Secondary | ICD-10-CM | POA: Diagnosis present

## 2018-06-12 DIAGNOSIS — M48062 Spinal stenosis, lumbar region with neurogenic claudication: Secondary | ICD-10-CM | POA: Diagnosis present

## 2018-06-12 DIAGNOSIS — Z87442 Personal history of urinary calculi: Secondary | ICD-10-CM

## 2018-06-12 DIAGNOSIS — M5442 Lumbago with sciatica, left side: Secondary | ICD-10-CM

## 2018-06-12 DIAGNOSIS — M4626 Osteomyelitis of vertebra, lumbar region: Secondary | ICD-10-CM | POA: Diagnosis present

## 2018-06-12 DIAGNOSIS — M4620 Osteomyelitis of vertebra, site unspecified: Secondary | ICD-10-CM | POA: Diagnosis not present

## 2018-06-12 DIAGNOSIS — T84498A Other mechanical complication of other internal orthopedic devices, implants and grafts, initial encounter: Secondary | ICD-10-CM | POA: Diagnosis not present

## 2018-06-12 DIAGNOSIS — Z419 Encounter for procedure for purposes other than remedying health state, unspecified: Secondary | ICD-10-CM | POA: Diagnosis not present

## 2018-06-12 DIAGNOSIS — Z961 Presence of intraocular lens: Secondary | ICD-10-CM | POA: Diagnosis present

## 2018-06-12 DIAGNOSIS — Z981 Arthrodesis status: Secondary | ICD-10-CM

## 2018-06-12 DIAGNOSIS — Z79891 Long term (current) use of opiate analgesic: Secondary | ICD-10-CM

## 2018-06-12 DIAGNOSIS — I2510 Atherosclerotic heart disease of native coronary artery without angina pectoris: Secondary | ICD-10-CM

## 2018-06-12 DIAGNOSIS — E1169 Type 2 diabetes mellitus with other specified complication: Secondary | ICD-10-CM | POA: Diagnosis present

## 2018-06-12 DIAGNOSIS — S32030K Wedge compression fracture of third lumbar vertebra, subsequent encounter for fracture with nonunion: Secondary | ICD-10-CM

## 2018-06-12 DIAGNOSIS — Z7983 Long term (current) use of bisphosphonates: Secondary | ICD-10-CM

## 2018-06-12 DIAGNOSIS — M4646 Discitis, unspecified, lumbar region: Secondary | ICD-10-CM | POA: Diagnosis present

## 2018-06-12 DIAGNOSIS — M4640 Discitis, unspecified, site unspecified: Secondary | ICD-10-CM

## 2018-06-12 DIAGNOSIS — Z8249 Family history of ischemic heart disease and other diseases of the circulatory system: Secondary | ICD-10-CM

## 2018-06-12 DIAGNOSIS — Z978 Presence of other specified devices: Secondary | ICD-10-CM | POA: Diagnosis not present

## 2018-06-12 DIAGNOSIS — Z8673 Personal history of transient ischemic attack (TIA), and cerebral infarction without residual deficits: Secondary | ICD-10-CM

## 2018-06-12 DIAGNOSIS — M810 Age-related osteoporosis without current pathological fracture: Secondary | ICD-10-CM | POA: Diagnosis present

## 2018-06-12 DIAGNOSIS — Y831 Surgical operation with implant of artificial internal device as the cause of abnormal reaction of the patient, or of later complication, without mention of misadventure at the time of the procedure: Secondary | ICD-10-CM | POA: Diagnosis present

## 2018-06-12 DIAGNOSIS — Z885 Allergy status to narcotic agent status: Secondary | ICD-10-CM

## 2018-06-12 DIAGNOSIS — D72829 Elevated white blood cell count, unspecified: Secondary | ICD-10-CM | POA: Diagnosis not present

## 2018-06-12 DIAGNOSIS — G9340 Encephalopathy, unspecified: Secondary | ICD-10-CM | POA: Diagnosis not present

## 2018-06-12 DIAGNOSIS — T8463XA Infection and inflammatory reaction due to internal fixation device of spine, initial encounter: Principal | ICD-10-CM | POA: Diagnosis present

## 2018-06-12 DIAGNOSIS — R351 Nocturia: Secondary | ICD-10-CM | POA: Diagnosis present

## 2018-06-12 DIAGNOSIS — Z955 Presence of coronary angioplasty implant and graft: Secondary | ICD-10-CM

## 2018-06-12 DIAGNOSIS — H9193 Unspecified hearing loss, bilateral: Secondary | ICD-10-CM | POA: Diagnosis present

## 2018-06-12 DIAGNOSIS — E1159 Type 2 diabetes mellitus with other circulatory complications: Secondary | ICD-10-CM

## 2018-06-12 DIAGNOSIS — E785 Hyperlipidemia, unspecified: Secondary | ICD-10-CM | POA: Diagnosis present

## 2018-06-12 DIAGNOSIS — Z87891 Personal history of nicotine dependence: Secondary | ICD-10-CM

## 2018-06-12 DIAGNOSIS — K5903 Drug induced constipation: Secondary | ICD-10-CM | POA: Diagnosis present

## 2018-06-12 DIAGNOSIS — Z781 Physical restraint status: Secondary | ICD-10-CM

## 2018-06-12 DIAGNOSIS — Z9842 Cataract extraction status, left eye: Secondary | ICD-10-CM

## 2018-06-12 DIAGNOSIS — Z888 Allergy status to other drugs, medicaments and biological substances status: Secondary | ICD-10-CM

## 2018-06-12 DIAGNOSIS — R011 Cardiac murmur, unspecified: Secondary | ICD-10-CM | POA: Diagnosis present

## 2018-06-12 DIAGNOSIS — T40605A Adverse effect of unspecified narcotics, initial encounter: Secondary | ICD-10-CM | POA: Diagnosis present

## 2018-06-12 DIAGNOSIS — E119 Type 2 diabetes mellitus without complications: Secondary | ICD-10-CM

## 2018-06-12 DIAGNOSIS — I252 Old myocardial infarction: Secondary | ICD-10-CM | POA: Diagnosis not present

## 2018-06-12 DIAGNOSIS — Z7902 Long term (current) use of antithrombotics/antiplatelets: Secondary | ICD-10-CM

## 2018-06-12 DIAGNOSIS — M4856XA Collapsed vertebra, not elsewhere classified, lumbar region, initial encounter for fracture: Secondary | ICD-10-CM | POA: Diagnosis present

## 2018-06-12 DIAGNOSIS — Z95828 Presence of other vascular implants and grafts: Secondary | ICD-10-CM | POA: Diagnosis not present

## 2018-06-12 DIAGNOSIS — I10 Essential (primary) hypertension: Secondary | ICD-10-CM | POA: Diagnosis present

## 2018-06-12 DIAGNOSIS — R3915 Urgency of urination: Secondary | ICD-10-CM | POA: Diagnosis present

## 2018-06-12 DIAGNOSIS — M6008 Infective myositis, other site: Secondary | ICD-10-CM | POA: Diagnosis present

## 2018-06-12 DIAGNOSIS — Z951 Presence of aortocoronary bypass graft: Secondary | ICD-10-CM

## 2018-06-12 DIAGNOSIS — Z974 Presence of external hearing-aid: Secondary | ICD-10-CM

## 2018-06-12 DIAGNOSIS — Z9841 Cataract extraction status, right eye: Secondary | ICD-10-CM

## 2018-06-12 DIAGNOSIS — Z79899 Other long term (current) drug therapy: Secondary | ICD-10-CM

## 2018-06-12 DIAGNOSIS — Z7982 Long term (current) use of aspirin: Secondary | ICD-10-CM

## 2018-06-12 LAB — CBC WITH DIFFERENTIAL/PLATELET
Abs Immature Granulocytes: 0.02 10*3/uL (ref 0.00–0.07)
BASOS ABS: 0 10*3/uL (ref 0.0–0.1)
BASOS PCT: 1 %
EOS PCT: 0 %
Eosinophils Absolute: 0 10*3/uL (ref 0.0–0.5)
HCT: 41.1 % (ref 39.0–52.0)
HEMOGLOBIN: 12.9 g/dL — AB (ref 13.0–17.0)
Immature Granulocytes: 0 %
LYMPHS PCT: 12 %
Lymphs Abs: 1.1 10*3/uL (ref 0.7–4.0)
MCH: 29.7 pg (ref 26.0–34.0)
MCHC: 31.4 g/dL (ref 30.0–36.0)
MCV: 94.7 fL (ref 80.0–100.0)
Monocytes Absolute: 0.9 10*3/uL (ref 0.1–1.0)
Monocytes Relative: 10 %
NRBC: 0 % (ref 0.0–0.2)
Neutro Abs: 6.5 10*3/uL (ref 1.7–7.7)
Neutrophils Relative %: 77 %
PLATELETS: 324 10*3/uL (ref 150–400)
RBC: 4.34 MIL/uL (ref 4.22–5.81)
RDW: 13.6 % (ref 11.5–15.5)
WBC: 8.6 10*3/uL (ref 4.0–10.5)

## 2018-06-12 LAB — URINALYSIS, ROUTINE W REFLEX MICROSCOPIC
Bilirubin Urine: NEGATIVE
Glucose, UA: NEGATIVE mg/dL
Hgb urine dipstick: NEGATIVE
Ketones, ur: NEGATIVE mg/dL
LEUKOCYTES UA: NEGATIVE
NITRITE: NEGATIVE
PH: 5 (ref 5.0–8.0)
Protein, ur: NEGATIVE mg/dL

## 2018-06-12 LAB — COMPREHENSIVE METABOLIC PANEL
ALK PHOS: 171 U/L — AB (ref 38–126)
ALT: 22 U/L (ref 0–44)
ANION GAP: 8 (ref 5–15)
AST: 20 U/L (ref 15–41)
Albumin: 3.4 g/dL — ABNORMAL LOW (ref 3.5–5.0)
BUN: 11 mg/dL (ref 8–23)
CHLORIDE: 102 mmol/L (ref 98–111)
CO2: 27 mmol/L (ref 22–32)
Calcium: 9.5 mg/dL (ref 8.9–10.3)
Creatinine, Ser: 1.03 mg/dL (ref 0.61–1.24)
GFR calc Af Amer: >60 mL/min (ref >60)
GFR calc non Af Amer: >60 mL/min (ref >60)
GLUCOSE: 145 mg/dL — AB (ref 70–99)
POTASSIUM: 3.8 mmol/L (ref 3.5–5.1)
SODIUM: 137 mmol/L (ref 135–145)
Total Bilirubin: 1.5 mg/dL — ABNORMAL HIGH (ref 0.3–1.2)
Total Protein: 7.3 g/dL (ref 6.5–8.1)

## 2018-06-12 LAB — GLUCOSE, CAPILLARY: GLUCOSE-CAPILLARY: 162 mg/dL — AB (ref 70–99)

## 2018-06-12 LAB — C-REACTIVE PROTEIN: CRP: 1.6 mg/dL — AB (ref <1.0)

## 2018-06-12 LAB — SEDIMENTATION RATE: SED RATE: 44 mm/h — AB (ref 0–16)

## 2018-06-12 LAB — LIPASE, BLOOD: Lipase: 22 U/L (ref 11–51)

## 2018-06-12 MED ORDER — LINACLOTIDE 145 MCG PO CAPS: 145.0000 ug | ORAL_CAPSULE | Freq: Every day | ORAL | 0 refills | Status: AC

## 2018-06-12 MED ORDER — IOHEXOL 300 MG/ML  SOLN
100.0000 mL | Freq: Once | INTRAMUSCULAR | Status: AC | PRN
  Administered 2018-06-12: 100 mL via INTRAVENOUS

## 2018-06-12 MED ORDER — HYDROMORPHONE HCL 1 MG/ML IJ SOLN
0.5000 mg | Freq: Once | INTRAMUSCULAR | Status: AC
  Administered 2018-06-12: 0.5 mg via INTRAVENOUS
  Filled 2018-06-12: qty 1

## 2018-06-12 MED ORDER — PANTOPRAZOLE SODIUM 40 MG PO TBEC
40.0000 mg | DELAYED_RELEASE_TABLET | Freq: Every day | ORAL | Status: DC
  Administered 2018-06-13 – 2018-06-26 (×13): 40 mg via ORAL
  Filled 2018-06-12 (×13): qty 1

## 2018-06-12 MED ORDER — SIMETHICONE 40 MG/0.6ML PO SUSP: 40.0000 mg | Freq: Four times a day (QID) | ORAL | 0 refills | Status: AC | PRN

## 2018-06-12 MED ORDER — HYDROMORPHONE HCL 1 MG/ML IJ SOLN
1.0000 mg | Freq: Once | INTRAMUSCULAR | Status: AC
  Administered 2018-06-12: 1 mg via INTRAVENOUS
  Filled 2018-06-12: qty 1

## 2018-06-12 MED ORDER — ENOXAPARIN SODIUM 40 MG/0.4ML ~~LOC~~ SOLN
40.0000 mg | SUBCUTANEOUS | Status: DC
  Administered 2018-06-12: 40 mg via SUBCUTANEOUS
  Filled 2018-06-12 (×2): qty 0.4

## 2018-06-12 MED ORDER — SIMETHICONE 40 MG/0.6ML PO SUSP
40.0000 mg | Freq: Four times a day (QID) | ORAL | Status: DC | PRN
  Filled 2018-06-12: qty 0.6

## 2018-06-12 MED ORDER — SODIUM CHLORIDE 0.9 % IV BOLUS
500.0000 mL | Freq: Once | INTRAVENOUS | Status: AC
  Administered 2018-06-12: 500 mL via INTRAVENOUS

## 2018-06-12 MED ORDER — ZOLPIDEM TARTRATE 5 MG PO TABS
10.0000 mg | ORAL_TABLET | Freq: Every evening | ORAL | Status: DC | PRN
  Filled 2018-06-12: qty 2

## 2018-06-12 MED ORDER — MORPHINE SULFATE (PF) 4 MG/ML IV SOLN
4.0000 mg | Freq: Once | INTRAVENOUS | Status: AC
  Administered 2018-06-12: 4 mg via INTRAVENOUS
  Filled 2018-06-12: qty 1

## 2018-06-12 MED ORDER — CLOPIDOGREL BISULFATE 75 MG PO TABS: 75.0000 mg | ORAL_TABLET | Freq: Every day | ORAL | Status: DC

## 2018-06-12 MED ORDER — LORAZEPAM 2 MG/ML IJ SOLN
1.0000 mg | Freq: Once | INTRAMUSCULAR | Status: AC
  Administered 2018-06-12: 1 mg via INTRAVENOUS
  Filled 2018-06-12: qty 1

## 2018-06-12 MED ORDER — VANCOMYCIN HCL 10 G IV SOLR
2000.0000 mg | Freq: Once | INTRAVENOUS | Status: AC
  Administered 2018-06-12: 2000 mg via INTRAVENOUS
  Filled 2018-06-12: qty 2000

## 2018-06-12 MED ORDER — PIPERACILLIN-TAZOBACTAM 3.375 G IVPB
3.3750 g | Freq: Three times a day (TID) | INTRAVENOUS | Status: DC
  Administered 2018-06-13: 3.375 g via INTRAVENOUS
  Filled 2018-06-12 (×3): qty 50

## 2018-06-12 MED ORDER — ASPIRIN EC 81 MG PO TBEC
81.0000 mg | DELAYED_RELEASE_TABLET | Freq: Every day | ORAL | Status: DC
  Administered 2018-06-13 – 2018-06-20 (×8): 81 mg via ORAL
  Filled 2018-06-12 (×8): qty 1

## 2018-06-12 MED ORDER — TAMSULOSIN HCL 0.4 MG PO CAPS
0.4000 mg | ORAL_CAPSULE | Freq: Every day | ORAL | Status: DC
  Administered 2018-06-12 – 2018-06-26 (×15): 0.4 mg via ORAL
  Filled 2018-06-12 (×15): qty 1

## 2018-06-12 MED ORDER — INSULIN ASPART 100 UNIT/ML ~~LOC~~ SOLN
0.0000 [IU] | Freq: Three times a day (TID) | SUBCUTANEOUS | Status: DC
  Administered 2018-06-13: 1 [IU] via SUBCUTANEOUS
  Administered 2018-06-14: 2 [IU] via SUBCUTANEOUS
  Administered 2018-06-15 – 2018-06-17 (×3): 1 [IU] via SUBCUTANEOUS
  Administered 2018-06-18: 2 [IU] via SUBCUTANEOUS
  Administered 2018-06-18 – 2018-06-19 (×3): 1 [IU] via SUBCUTANEOUS
  Administered 2018-06-20: 2 [IU] via SUBCUTANEOUS
  Administered 2018-06-20: 1 [IU] via SUBCUTANEOUS
  Administered 2018-06-21: 2 [IU] via SUBCUTANEOUS
  Administered 2018-06-22 (×2): 1 [IU] via SUBCUTANEOUS
  Administered 2018-06-22: 2 [IU] via SUBCUTANEOUS
  Administered 2018-06-24: 1 [IU] via SUBCUTANEOUS
  Administered 2018-06-24: 2 [IU] via SUBCUTANEOUS
  Administered 2018-06-24 – 2018-06-25 (×2): 1 [IU] via SUBCUTANEOUS
  Administered 2018-06-25: 2 [IU] via SUBCUTANEOUS
  Administered 2018-06-25: 1 [IU] via SUBCUTANEOUS
  Administered 2018-06-26: 2 [IU] via SUBCUTANEOUS
  Administered 2018-06-26 (×2): 1 [IU] via SUBCUTANEOUS

## 2018-06-12 MED ORDER — ACETAMINOPHEN 325 MG PO TABS
650.0000 mg | ORAL_TABLET | Freq: Four times a day (QID) | ORAL | Status: DC
  Administered 2018-06-13 – 2018-06-20 (×24): 650 mg via ORAL
  Filled 2018-06-12 (×26): qty 2

## 2018-06-12 MED ORDER — HYDROMORPHONE HCL 1 MG/ML IJ SOLN
1.0000 mg | INTRAMUSCULAR | Status: DC | PRN
  Administered 2018-06-12 – 2018-06-19 (×21): 1 mg via INTRAVENOUS
  Filled 2018-06-12 (×22): qty 1

## 2018-06-12 MED ORDER — IBUPROFEN 400 MG PO TABS
400.0000 mg | ORAL_TABLET | Freq: Three times a day (TID) | ORAL | Status: DC
  Administered 2018-06-13: 400 mg via ORAL
  Filled 2018-06-12 (×2): qty 1

## 2018-06-12 MED ORDER — DEXTROSE-NACL 5-0.9 % IV SOLN: INTRAVENOUS | Status: DC

## 2018-06-12 MED ORDER — PIPERACILLIN-TAZOBACTAM 3.375 G IVPB 30 MIN
3.3750 g | Freq: Once | INTRAVENOUS | Status: AC
  Administered 2018-06-12: 3.375 g via INTRAVENOUS

## 2018-06-12 MED ORDER — ZOLPIDEM TARTRATE 5 MG PO TABS
5.0000 mg | ORAL_TABLET | Freq: Every evening | ORAL | Status: DC | PRN
  Administered 2018-06-12: 5 mg via ORAL
  Filled 2018-06-12: qty 1

## 2018-06-12 MED ORDER — POLYETHYLENE GLYCOL 3350 17 G PO PACK
17.0000 g | PACK | Freq: Every day | ORAL | Status: DC
  Administered 2018-06-13 – 2018-06-16 (×4): 17 g via ORAL
  Filled 2018-06-12 (×4): qty 1

## 2018-06-12 MED ORDER — SODIUM CHLORIDE 0.9 % IV SOLN
INTRAVENOUS | Status: DC
  Administered 2018-06-12 – 2018-06-18 (×5): via INTRAVENOUS

## 2018-06-12 MED ORDER — METOPROLOL TARTRATE 25 MG PO TABS
25.0000 mg | ORAL_TABLET | Freq: Two times a day (BID) | ORAL | Status: DC
  Administered 2018-06-12 – 2018-06-26 (×27): 25 mg via ORAL
  Filled 2018-06-12 (×27): qty 1

## 2018-06-12 MED ORDER — VANCOMYCIN HCL 10 G IV SOLR
1250.0000 mg | Freq: Two times a day (BID) | INTRAVENOUS | Status: DC
  Administered 2018-06-13: 1250 mg via INTRAVENOUS
  Filled 2018-06-12 (×2): qty 1250

## 2018-06-12 MED ORDER — CLOPIDOGREL BISULFATE 75 MG PO TABS
75.0000 mg | ORAL_TABLET | Freq: Every day | ORAL | Status: DC
Start: 2018-06-12 — End: 2018-06-13
  Administered 2018-06-12 – 2018-06-13 (×2): 75 mg via ORAL
  Filled 2018-06-12 (×2): qty 1

## 2018-06-12 MED ORDER — KETOROLAC TROMETHAMINE 30 MG/ML IJ SOLN: 30.0000 mg | Freq: Once | INTRAMUSCULAR | Status: DC

## 2018-06-12 MED ORDER — ONDANSETRON HCL 4 MG/2ML IJ SOLN
4.0000 mg | Freq: Once | INTRAMUSCULAR | Status: AC
  Administered 2018-06-12: 4 mg via INTRAVENOUS
  Filled 2018-06-12: qty 2

## 2018-06-12 MED ORDER — METOPROLOL TARTRATE 25 MG PO TABS: 50.0000 mg | ORAL_TABLET | Freq: Two times a day (BID) | ORAL | Status: DC

## 2018-06-12 MED ORDER — BACLOFEN 5 MG HALF TABLET
5.0000 mg | ORAL_TABLET | Freq: Three times a day (TID) | ORAL | Status: DC | PRN
  Filled 2018-06-12: qty 2

## 2018-06-12 MED ORDER — ONDANSETRON HCL 4 MG PO TABS: 4.0000 mg | ORAL_TABLET | Freq: Three times a day (TID) | ORAL | Status: DC | PRN

## 2018-06-12 MED ORDER — FINASTERIDE 5 MG PO TABS
5.0000 mg | ORAL_TABLET | Freq: Every day | ORAL | Status: DC
  Administered 2018-06-13 – 2018-06-26 (×13): 5 mg via ORAL
  Filled 2018-06-12 (×13): qty 1

## 2018-06-12 MED ORDER — OXYCODONE HCL 5 MG PO TABS
5.0000 mg | ORAL_TABLET | ORAL | Status: DC | PRN
  Administered 2018-06-14 – 2018-06-19 (×9): 5 mg via ORAL
  Filled 2018-06-12 (×11): qty 1

## 2018-06-12 MED ORDER — ONDANSETRON HCL 4 MG PO TABS: 4.0000 mg | ORAL_TABLET | Freq: Three times a day (TID) | ORAL | 0 refills | Status: AC | PRN

## 2018-06-12 MED ORDER — CYCLOBENZAPRINE HCL 5 MG PO TABS
5.0000 mg | ORAL_TABLET | Freq: Three times a day (TID) | ORAL | Status: DC | PRN
  Administered 2018-06-14 – 2018-06-21 (×5): 5 mg via ORAL
  Filled 2018-06-12 (×6): qty 1

## 2018-06-12 NOTE — H&P (Signed)
History and Physical    Jeffrey Nelson GNF:[id number] DOB: Jul 15, 1935 DOA: 06/12/2018  PCP: Deloris Ping, MD   Patient coming from: Home   Chief Complaint: Back pain  HPI: Jeffrey Nelson is a 83 y.o. 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  For the last 2 weeks patient has been experiencing worsening lower back pain, sharp in nature, severe in intensity, 10 out of 10, worse with movement and lying supine, no improving factors, for last 2 days it has been associated with chills.  Patient was seen in the orthopedic clinic by Dr. Otelia Sergeant, October 2 for worsening back pain, patient received tapentadol, and instructions to avoid frequent bending, and heavy lifting.  A bone scan was ordered and spine x-rays.  He was diagnosed with hardware loosening and compression fracture of L3.  Patient symptoms kept worsening and he was referred to the emergency department for an emergent spine MRI to rule out infection.   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  Patient was referred for admission for evaluation.  Review of Systems:  1. General: No fevers, but positive chills, no weight gain or weight loss 2. ENT: No runny nose or sore throat, no hearing disturbances 3. Pulmonary: No dyspnea, cough, wheezing, or hemoptysis 4. Cardiovascular: No angina, claudication, lower extremity edema, pnd or orthopnea 5. Gastrointestinal: Positive nausea or vomiting, no diarrhea or constipation 6. Hematology: No easy bruisability or frequent infections 7. Urology: No dysuria, hematuria or increased urinary frequency 8. Dermatology: No rashes. 9. Neurology: No  seizures or paresthesias 10. Musculoskeletal: No joint pain or deformities  Past Medical History:  Diagnosis Date  . Anginal pain (HCC)    occ  . Arthritis    "joints" (10/09/2017)  . Chronic lower back pain   . Complication of anesthesia    extremely claustrophobic; "have to be put down for MRI; don't sit in back seat of car;, etc."  . Coronary artery disease   . High cholesterol    "get shots twice/month" (10/09/2017)  . History of blood transfusion 2001   "related to CABG"  . History of kidney stones   . HOH (hard of hearing)    wearing hearing aids  . Hypertension   . Myocardial infarction (HCC) 2000s-06/2017 X 4  . Stroke [facility]) ~ 2015   "in my right eye; sight is coming back little by little" (10/09/2017)  . Type II diabetes mellitus (HCC)     Past Surgical History:  Procedure Laterality Date  . ANTERIOR CERVICAL DECOMP/DISCECTOMY FUSION N/A 05/14/2015   Procedure: C3-4  ANTERIOR CERVICAL DISCECTOMY AND FUSION WITH PLATE AND SCREWS, LOCAL AND ALLOGRAFT BONE GRAFT;  Surgeon: Kerrin Champagne, MD;  Location: [facility] OR;  Service: Orthopedics;  Laterality: N/A;  . BACK SURGERY    . CARDIAC CATHETERIZATION     X 17 (10/09/2017)  . CATARACT EXTRACTION W/ INTRAOCULAR LENS  IMPLANT, BILATERAL Bilateral   . COLONOSCOPY    . CORONARY ANGIOPLASTY  06/2017  . CORONARY ANGIOPLASTY WITH STENT PLACEMENT     "6 stents total" (10/09/2017)  . CORONARY ARTERY BYPASS GRAFT  2001   CABG x4   . CYSTOSCOPY W/ STONE MANIPULATION    . JOINT REPLACEMENT    .  LUMBAR LAMINECTOMY Right 10/13/2017   Procedure: MICRODISCECTOMY LUMBAR LAMINECTOMY RIGHT L3-4;  Surgeon: Kerrin Champagne, MD;  Location: [facility] OR;  Service: Orthopedics;  Laterality: Right;  . LUMBAR LAMINECTOMY Left 11/12/2017   Procedure: MICRODISCECTOMY L3-4 FOR RECURRENT HNP POSSIBLE FLURO;  Surgeon: Kerrin Champagne, MD;  Location: [facility] OR;  Service: Orthopedics;  Laterality: Left;  . LUMBAR LAMINECTOMY/DECOMPRESSION MICRODISCECTOMY N/A 01/16/2014    Procedure: Left L3-4 and L4-5 foraminotomies;  Surgeon: Kerrin Champagne, MD;  Location: [facility] OR;  Service: Orthopedics;  Laterality: N/A;  . NASAL FRACTURE SURGERY    . NASAL SINUS SURGERY     "several times since 1959"  . RADIOLOGY WITH ANESTHESIA N/A 10/12/2017   Procedure: MRI WITH ANESTHESIA;  Surgeon: Radiologist, Medication, MD;  Location: [facility] OR;  Service: Radiology;  Laterality: N/A;  . RADIOLOGY WITH ANESTHESIA N/A 12/05/2017   Procedure: MRI WITH ANESTHESIA;  Surgeon: Radiologist, Medication, MD;  Location: [facility] OR;  Service: Radiology;  Laterality: N/A;  . SHOULDER OPEN ROTATOR CUFF REPAIR Bilateral   . TOTAL KNEE ARTHROPLASTY Right 2012  . TYMPANOSTOMY TUBE PLACEMENT Bilateral   . ULNAR NERVE TRANSPOSITION Left 05/14/2015   Procedure: LEFT ULNAR NERVE DECOMPRESSION AT THE ELBOW;  Surgeon: Kerrin Champagne, MD;  Location: [facility] OR;  Service: Orthopedics;  Laterality: Left;     reports that he quit smoking about 61 years ago. He has a 2.00 pack-year smoking history. He has never used smokeless tobacco. He reports that he does not drink alcohol or use drugs.  Allergies  Allergen Reactions  . Ezetimibe Other (See Comments)    Cramps Cramps  . Morphine And Related Other (See Comments)    Confusion Confusion  . Statins Other (See Comments)    Cramps Cramps    Family History  Problem Relation Age of Onset  . CAD Brother   . Cancer Brother   . Alzheimer's disease Mother   . Healthy Daughter   . Healthy Daughter   . Diabetes Neg Hx      Prior to Admission medications   Medication Sig Start Date End Date Taking? Authorizing Provider  Acetaminophen (TYLENOL) 325 MG CAPS Take by mouth as needed.     [provider]  alendronate (FOSAMAX) 70 MG tablet Take 1 tablet (70 mg total) by mouth once a week. Take with a full glass of water on an empty stomach. 05/13/18   Pollyann Savoy, MD  aspirin EC 81 MG tablet Take 81 mg by mouth daily.    [provider]  atenolol  (TENORMIN) 50 MG tablet Take by mouth. 03/30/17   [provider]  baclofen (LIORESAL) 10 MG tablet Take 0.5-1 tablets (5-10 mg total) by mouth 3 (three) times daily as needed for muscle spasms. 05/10/18   Hilts, Casimiro Needle, MD  Cholecalciferol (VITAMIN D3) 50000 units TABS Take by mouth.    [provider]  clopidogrel (PLAVIX) 75 MG tablet Take by mouth. 05/07/18   [provider]  Cranberry 400 MG CAPS Take by mouth.    [provider]  Evolocumab (REPATHA) 140 MG/ML SOSY Inject 140 mg into the skin every 14 (fourteen) days.    [provider]  finasteride (PROSCAR) 5 MG tablet Take by mouth. 12/28/17   [provider]  gabapentin (NEURONTIN) 300 MG capsule Take 1 capsule (300 mg total) by mouth 2 (two) times daily. 04/24/18   Kerrin Champagne, MD  gabapentin (NEURONTIN) 300 MG capsule TAKE 1 CAPSULE BY MOUTH THREE TIMES DAILY 06/10/18  Kerrin Champagne, MD  linaclotide [facility]) 145 MCG CAPS capsule Take 1 capsule (145 mcg total) by mouth daily before breakfast. 06/12/18   Kerrin Champagne, MD  meloxicam (MOBIC) 7.5 MG tablet Take 7.5 mg by mouth daily. 11/28/17   [provider]  metoprolol tartrate (LOPRESSOR) 50 MG tablet Take 1 tablet (50 mg total) by mouth 2 (two) times daily. 12/17/17 12/17/18  Esperanza Sheets, MD  Multiple Vitamin (MULTIVITAMIN WITH MINERALS) TABS tablet Take 1 tablet by mouth daily.    [provider]  naproxen sodium (ALEVE) 220 MG tablet Take 220 mg by mouth as needed.    [provider]  ondansetron (ZOFRAN) 4 MG tablet Take 1 tablet (4 mg total) by mouth every 8 (eight) hours as needed for nausea or vomiting. 06/12/18   Kerrin Champagne, MD  simethicone (MYLICON) 40 MG/0.6ML drops Take 0.6 mLs (40 mg total) by mouth 4 (four) times daily as needed for flatulence. 06/12/18   Kerrin Champagne, MD  tamsulosin (FLOMAX) 0.4 MG CAPS capsule Take by mouth.    [provider]  Tapentadol HCl (NUCYNTA) 100 MG  TABS Take one tablet every 6 hour prn pain 05/29/18   Kerrin Champagne, MD  Teriparatide, Recombinant, 600 MCG/2.4ML SOLN Inject 0.08 mLs (20 mcg total) into the skin daily. 06/10/18   Pollyann Savoy, MD  Vitamin D, Ergocalciferol, (DRISDOL) 50000 units CAPS capsule Take 1 capsule (50,000 Units total) by mouth every 7 (seven) days. 03/07/18   Kerrin Champagne, MD  zolpidem (AMBIEN) 10 MG tablet Take by mouth as needed.  12/25/17   [provider]  zolpidem (AMBIEN) 10 MG tablet Take 1 tablet (10 mg total) by mouth at bedtime as needed for sleep. 05/29/18 06/28/18  Kerrin Champagne, MD    Physical Exam: Vitals:   06/12/18 1641 06/12/18 1700 06/12/18 1815 06/12/18 1845  BP: 115/61 128/71 133/61 111/68  Pulse: 78 77 80 76  Resp: 16 16 16 18   Temp:      TempSrc:      SpO2: 94% 95% 95% 95%     Vitals:   06/12/18 1641 06/12/18 1700 06/12/18 1815 06/12/18 1845  BP: 115/61 128/71 133/61 111/68  Pulse: 78 77 80 76  Resp: 16 16 16 18   Temp:      TempSrc:      SpO2: 94% 95% 95% 95%   General: deconditioned and ill looking appearing  Neurology: Awake and alert, non focal Head and Neck. Head normocephalic. Neck supple with no adenopathy or thyromegaly.  Decreased hearing  E ENT: mild pallor, no icterus, oral mucosa moist Cardiovascular: No JVD. S1-S2 present, rhythmic, no gallops, or rubs, positive systolic murmur 3/6 at the right sternal border. No lower extremity edema. Pulmonary: vesicular breath sounds bilaterally, adequate air movement, no wheezing, rhonchi or rales. Gastrointestinal. Abdomen with no organomegaly, non tender, no rebound or guarding Skin. No rashes Musculoskeletal: pain to palpation at the lower lumbar spine, no local erythema or fluctuating tissue.     Labs on Admission: I have personally reviewed following labs and imaging studies  CBC: Recent Labs  Lab 06/12/18 1308  WBC 8.6  NEUTROABS 6.5  HGB 12.9*  HCT 41.1  MCV 94.7  PLT 324   Basic Metabolic  Panel: Recent Labs  Lab 06/12/18 1308  NA 137  K 3.8  CL 102  CO2 27  GLUCOSE 145*  BUN 11  CREATININE 1.03  CALCIUM 9.5   GFR: Estimated Creatinine Clearance: 70.8  mL/min (by C-G formula based on SCr of 1.03 mg/dL). Liver Function Tests: Recent Labs  Lab 06/12/18 1308  AST 20  ALT 22  ALKPHOS 171*  BILITOT 1.5*  PROT 7.3  ALBUMIN 3.4*   Recent Labs  Lab 06/12/18 1308  LIPASE 22   No results for input(s): AMMONIA in the last 168 hours. Coagulation Profile: No results for input(s): INR, PROTIME in the last 168 hours. Cardiac Enzymes: No results for input(s): CKTOTAL, CKMB, CKMBINDEX, TROPONINI in the last 168 hours. BNP (last 3 results) No results for input(s): PROBNP in the last 8760 hours. HbA1C: No results for input(s): HGBA1C in the last 72 hours. CBG: No results for input(s): GLUCAP in the last 168 hours. Lipid Profile: No results for input(s): CHOL, HDL, LDLCALC, TRIG, CHOLHDL, LDLDIRECT in the last 72 hours. Thyroid Function Tests: No results for input(s): TSH, T4TOTAL, FREET4, T3FREE, THYROIDAB in the last 72 hours. Anemia Panel: No results for input(s): VITAMINB12, FOLATE, FERRITIN, TIBC, IRON, RETICCTPCT in the last 72 hours. Urine analysis:    Component Value Date/Time   COLORURINE YELLOW 12/14/2017 1803   APPEARANCEUR CLEAR 12/14/2017 1803   LABSPEC 1.012 12/14/2017 1803   PHURINE 7.0 12/14/2017 1803   GLUCOSEU NEGATIVE 12/14/2017 1803   HGBUR NEGATIVE 12/14/2017 1803   BILIRUBINUR NEGATIVE 12/14/2017 1803   KETONESUR NEGATIVE 12/14/2017 1803   PROTEINUR NEGATIVE 12/14/2017 1803   UROBILINOGEN 0.2 05/07/2015 1345   NITRITE NEGATIVE 12/14/2017 1803   LEUKOCYTESUR TRACE (A) 12/14/2017 1803    Radiological Exams on Admission: Mr Lumbar Spine Wo Contrast  Result Date: 06/12/2018 CLINICAL DATA:  83 year old male with increased lumbar spine pain status post fusion 6 months ago. Fractures/collapse of the L3 vertebral body. EXAM: MRI LUMBAR  SPINE WITHOUT CONTRAST TECHNIQUE: Multiplanar, multisequence MR imaging of the lumbar spine was performed. No intravenous contrast was administered. COMPARISON:  CT lumbar Spine [facility] 05/14/2018. Lumbar MRI 12/05/2017, and earlier. FINDINGS: Segmentation: Hypoplastic ribs assumed T12 as on the CT and April MRI numbering. Alignment: Straightening of lumbar lordosis is stable from last month. Vertebrae: Confluent marrow edema from the posterior inferior L2 body, throughout the L3 body, and into the superior and posterior L4 body. See series 7, image 9. This has progressed from the L3-L4 level endplate edema seen in April, with moderate collapse of L3 and erosion of the L3-L4 endplates since the prior MRI. Interbody implant at L3-L4. New abnormal increased T2 and STIR signal in the L2-L3 disc, with new L2 endplate and body marrow edema since April. The L1 and L5 levels appear stable and intact. Mild chronic T12 inferior endplate compression is stable. Intact visible lower thoracic levels. Intact visible sacrum and SI joints. Conus medullaris and cauda equina: Conus extends to the T12-L1 level. No lower spinal cord or conus signal abnormality. Paraspinal and other soft tissues: Confluent left greater than right medial psoas muscle edema as seen on series 6, image 13 and series 5, images 17 (on the left) and 1 (on the right). No intramuscular fluid collection is identified. The STIR images demonstrate prominent marrow edema in the L3 transverse processes. Superimposed postoperative changes to the lower lumbar erector spinae muscles. Negative visible abdominal viscera. Disc levels: No Lower spinal stenosis above L2. L2-L3: New abnormal increased T2 and STIR signal within the disc. Progressed circumferential disc bulge with chronic mild to moderate facet and ligament flavum hypertrophy. New mild to moderate spinal stenosis. Chronic mild to moderate L2 foraminal stenosis greater on the right.  L3-L4: Posterior and interbody fusion hardware here and prior decompression. No spinal stenosis. There appears to be soft tissue edema affecting the bilateral L3 neural foramina. L4-L5: Posterior and interbody fusion hardware plus decompression here. Mild residual right L4 foraminal stenosis related to facet hypertrophy. L5-S1: Stable and negative aside from mild facet hypertrophy. IMPRESSION: 1. Constellation of imaging findings since the 12/05/2017 MRI is most compatible with prolonged Discitis Osteomyelitis at L3-L4, and extension of Discitis Osteomyelitis to L2-L3 since April. There is bilateral L3 through L5 spinal fusion hardware in place. Associated partial collapse of L3 with L3 and L4 endplate erosion. Abnormal L2-L3 disc and L2 inferior endplates now. 2. Bilateral psoas muscle edema/inflammation. No intramuscular or epidural abscess identified. 3. Multifactorial mild to moderate spinal stenosis at L2-L3 is new since April. Electronically Signed   By: Odessa Fleming M.D.   On: 06/12/2018 16:14   Ct Abdomen Pelvis W Contrast  Result Date: 06/12/2018 CLINICAL DATA:  Left lower quadrant pain with diarrhea. EXAM: CT ABDOMEN AND PELVIS WITH CONTRAST TECHNIQUE: Multidetector CT imaging of the abdomen and pelvis was performed using the standard protocol following bolus administration of intravenous contrast. CONTRAST:  100 cc Omnipaque 300 intravenous COMPARISON:  MRI of the lumbar spine from the same day FINDINGS: Lower chest:  Coronary calcification.  No acute finding Hepatobiliary: No focal liver abnormality.Single stone within the gallbladder which is full but shows no inflammatory changes. Mild prominence of the biliary tree without calcified choledocholithiasis Pancreas: Unremarkable. Spleen: Unremarkable. 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 No hydronephrosis or stone. Subcentimeter low-densities in the left kidney, too small for densitometry. Unremarkable bladder. Stomach/Bowel:  No  obstruction. No appendicitis. Vascular/Lymphatic: No acute vascular abnormality. Extensive atherosclerotic calcification of the aorta. No mass or adenopathy. Reproductive:Probable central prostate nodule.  No acute finding. Other: No ascites or pneumoperitoneum. Musculoskeletal: L2-3 and L3-4 discitis osteomyelitis. There has been L3-4 PLIF. The surrounding paravertebral fat is edematous. The pedicle screws at L3 and L4 show halo in/loosening. No evident collection. There is subsequent MRI available. IMPRESSION: 1. L2-3 and L3-4 discitis osteomyelitis which involves L3-4 PLIF hardware. 2. No evidence of colitis or other inflammation to explain diarrhea. 3. Cholelithiasis. Electronically Signed   By: Marnee Spring M.D.   On: 06/12/2018 18:13    Assessment/Plan Active Problems:   Osteomyelitis [facility])  83 year old male with history of lumbar spinal fusion (11/2017), who presented with worsening lower back pain for the last 2 weeks, severe intensity and for the last 48 hours associated with chills. He has been diagnosed as an outpatient with hardware loosening and compression fracture of L3, and his symptoms have been refractive to outpatient management.  On the initial physical examination blood pressure 111/68, heart rate 76, respiratory rate 18, oxygen saturation 95%, dry mucous membranes, lungs clear to auscultation bilaterally, heart S1-S2 present and rhythmic, positive systolic murmur 3 out of 6 right sternal border, abdomen soft nontender, no lower extremity edema.  Sodium 137, potassium 3.8, chloride 102, bicarb 27, glucose 145, BUN 11, creatinine 1.0, AST 20, ALT 22, CRP 1.6, white count 8.6, hemoglobin 12.9, hematocrit 41.1, platelets 324.  Sedimentation rate 44.  Abdominal and pelvic CT scan with L2/3 and L3/4 discitis osteomyelitis which involves L3/4 PLIF hardware.  Number spine MRI with discitis osteomyelitis L3/L4 and L2/L3.  Associated partial collapse of L3, bilateral psoas muscle  edema/inflammation.  No intramuscular or epidural abscess is identified.  Patient will be admitted to the hospital with L2-L3, L3-L4 discitis osteomyelitis, involving PLIF  hardware.   1.  L2-L3, L3-L4 discitis/osteomyelitis involving PLIF hardware.  Patient will be admitted to the medical ward, he will be placed on broad-spectrum IV antibiotic therapy with vancomycin and Zosyn, will follow up on blood cultures, cell count and temperature curve.  He will need to follow-up in the morning with orthopedic service, may need further surgical intervention to remove hardware.  Likely will need prolonged intravenous antibiotic therapy, infectious disease likely need to be consulted as well within the next 24 hours.  Continue pain control with scheduled acetaminophen, and ibuprofen, as needed muscle relaxant cyclobenzaprine and for breakthrough pain control as needed IV hydromorphone (severe pain) and po oxycodone  (moderate pain).  Physical therapy will need to see him during this hospitalization as well.  Will add GI and bowel regimen prophylaxis.  Gentle hydration with normal saline.  2.  Coronary artery disease.  Currently chest pain-free, no recent history of angina, continue with dual antiplatelet therapy with aspirin and clopidogrel.  Beta-blockade with metoprolol, per his wife he has been taking 25 mg twice daily.  3.  Type 2 diabetes mellitus.  At home he has been diet controlled, will add sliding scale for glucose coverage and monitoring during his hospitalization.  His random glucose admission is 145.  4.  BPH.  No signs of urinary retention, continue tamsulosin and finesteride.    DVT prophylaxis: enoxaparin  Code Status:  full  Family Communication: I spoke with patient's wife and daughter at the bedside and all questions were addressed.   Disposition Plan:  Med Surg  Consults called:  Need to call in am orthopedics  Admission status:  Inpatient.     Kalenna Millett Annett Gula MD [facility] Pager [phone]  If 7PM-7AM, please contact night-coverage [email or web address] Password [facility]  06/12/2018, 6:56 PM

## 2018-06-12 NOTE — Progress Notes (Addendum)
Pharmacy Antibiotic Note  Jeffrey Nelson is a 83 y.o. male admitted on 06/12/2018 with osteomyelitis.  Pharmacy has been consulted for Vancomycin and Zosyn  dosing. MRI reveals discitis osteomyelitis at L3-L4 and extension of discitis osteomyelitis L2-L3. Pt recently diagnosed w/ hardware loosening and compression fracture of L3. Orthopedic consult placed for possible surgical intervention to remove hardware. WBC - 8.6, afebrile  Plan: Vancomycin 2g x1, then Vancomycin 1250mg  q12h Zosyn 3.375g q8h Monitor and adjust abx per renal fx, C&S, vanc trough as needed     Temp (24hrs), Avg:98.2 F (36.8 C), Min:97.7 F (36.5 C), Max:98.7 F (37.1 C)  Recent Labs  Lab 06/12/18 1308  WBC 8.6  CREATININE 1.03    Estimated Creatinine Clearance: 70.8 mL/min (by C-G formula based on SCr of 1.03 mg/dL).    Allergies  Allergen Reactions  . Ezetimibe Other (See Comments)    Cramps Cramps  . Statins Other (See Comments)    Cramps Cramps    Antimicrobials this admission: Vancomycin 10/16 >>  Zosyn 10/16 >>   Dose adjustments this admission: N/A  Microbiology results: pending  Thank you for allowing pharmacy to be a part of this patient's care.  Koleen Nimrod 06/12/2018 8:12 PM    I discussed / reviewed the pharmacy note by Dr. Ramond Dial, PharmD candidate and I agree with the resident's findings and plans as documented.  Keanthony Poole S. Merilynn Finland, PharmD, BCPS Clinical Staff Pharmacist Pager [phone]

## 2018-06-12 NOTE — ED Notes (Signed)
Dr. Otelia Sergeant called , pt. Coming for increased pain in the lumbar spine. Had a fusion 6 months ago. His nuro exam is normal. Please have EDP to evaluate pt. For osteomyelitis / collapse vertebra. Pt. Might need MRI

## 2018-06-12 NOTE — ED Provider Notes (Signed)
[facility] EM[facility] DE[facility]MENT Provider Note   CSN: [id number] Arrival date & time: 06/12/18  1046     History   Chief Complaint Chief Complaint  Patient presents with  . Back Pain    HPI Jeffrey Nelson is a 83 y.o. male.  HPI   Patient is an 83 year old male with a history of chronic lower back pain s/p lumbar laminectomy, CAD, CVA, T2DM, diverticulosis, who presents emergency department today for evaluation of worsening of his chronic lower back pain.  States for the last week his pain is been uncontrolled by his typical home regimen.  He feels the pain radiates around to the left lower abdomen.  Has not typically had abdominal pain with his back pain in the past.  Pain severe in nature.  Associated by bilateral feet numbness.  No loss control bowel or bladder function.  No urinary retention.  Reports chills but no documented fevers.  He also reports nausea, diarrhea, but no episodes of vomiting. No dysuria, frequency, urgency. No CP or SOB.  He was seen by Dr. Louanne Skye earlier this morning who sent to the pr to the ED today for further evaluation and potential MRI due to concern for osteomyelitis.   Past Medical History:  Diagnosis Date  . Anginal pain ([place])    occ  . Arthritis    "joints" (10/09/2017)  . Chronic lower back pain   . Complication of anesthesia    extremely claustrophobic; "have to be put down for MRI; don't sit in back seat of car;, etc."  . Coronary artery disease   . High cholesterol    "get shots twice/month" (10/09/2017)  . History of blood transfusion 2001   "related to CABG"  . History of kidney stones   . HOH (hard of hearing)    wearing hearing aids  . Hypertension   . Myocardial infarction ([place]) 2000s-06/2017 X 4  . Stroke [facility]) ~ 2015   "in my right eye; sight is coming back little by little" (10/09/2017)  . Type II diabetes mellitus [facility])     Patient Active Problem List   Diagnosis Date Noted  . Osteomyelitis ([place])  06/12/2018  . Lumbosacral radiculopathy at L3   . Tachycardia 12/14/2017  . Leukocytosis 12/14/2017  . Anemia due to blood loss 12/13/2017    Class: Chronic  . Syncope 12/03/2017  . Dehydration 12/03/2017  . Chronic back pain 12/03/2017  . Hypotension 12/03/2017  . Subclavian artery stenosis, right ([place]) 12/03/2017  . Status post lumbar laminectomy 11/12/2017  . Postoperative pain after spinal surgery 11/11/2017  . Weakness of right lower extremity   . Sciatica associated with disorder of lumbosacral spine   . Surgery, elective   . Diabetes mellitus type 2, uncomplicated ([place]) 89/21/1939    Class: Chronic  . Lumbar disc herniation with radiculopathy 10/13/2017    Class: Acute  . Radiculopathy, lumbar region 10/09/2017  . Pain in right hip 10/09/2017    Class: Acute  . Right knee sprain 10/09/2017    Class: Acute  . Hip pain, acute, right 10/09/2017  . Bradycardia 10/09/2017  . Spondylolisthesis, lumbar region 10/05/2017  . Knee contusion 07/24/2016  . Spinal stenosis in cervical region 05/14/2015    Class: Chronic  . Cubital tunnel syndrome on left 05/14/2015    Class: Chronic  . Stenosis of cervical spine with myelopathy ([place]) 05/14/2015  . Spinal stenosis, lumbar region, with neurogenic claudication 01/16/2014    Class: Chronic    Past Surgical History:  Procedure Laterality Date  . ANTERIOR CERVICAL DECOMP/DISCECTOMY FUSION N/A 05/14/2015   Procedure: C3-4  ANTERIOR CERVICAL DISCECTOMY AND FUSION WITH PLATE AND SCREWS, LOCAL AND ALLOGRAFT BONE GRAFT;  Surgeon: Jessy Oto, MD;  Location: [place];  Service: Orthopedics;  Laterality: N/A;  . BACK SURGERY    . CARDIAC CATHETERIZATION     X 17 (10/09/2017)  . CATARACT EXTRACTION W/ INTRAOCULAR LENS  IMPLANT, BILATERAL Bilateral   . COLONOSCOPY    . CORONARY ANGIOPLASTY  06/2017  . CORONARY ANGIOPLASTY WITH STENT PLACEMENT     "6 stents total" (10/09/2017)  . CORONARY ARTERY BYPASS GRAFT  2001   CABG x4   . CYSTOSCOPY  W/ STONE MANIPULATION    . JOINT REPLACEMENT    . LUMBAR LAMINECTOMY Right 10/13/2017   Procedure: MICRODISCECTOMY LUMBAR LAMINECTOMY RIGHT L3-4;  Surgeon: Jessy Oto, MD;  Location: [place];  Service: Orthopedics;  Laterality: Right;  . LUMBAR LAMINECTOMY Left 11/12/2017   Procedure: MICRODISCECTOMY L3-4 FOR RECURRENT HNP POSSIBLE FLURO;  Surgeon: Jessy Oto, MD;  Location: [place];  Service: Orthopedics;  Laterality: Left;  . LUMBAR LAMINECTOMY/DECOMPRESSION MICRODISCECTOMY N/A 01/16/2014   Procedure: Left L3-4 and L4-5 foraminotomies;  Surgeon: Jessy Oto, MD;  Location: [place];  Service: Orthopedics;  Laterality: N/A;  . NASAL FRACTURE SURGERY    . NASAL SINUS SURGERY     "several times since 1959"  . RADIOLOGY WITH ANESTHESIA N/A 10/12/2017   Procedure: MRI WITH ANESTHESIA;  Surgeon: Radiologist, Medication, MD;  Location: [place];  Service: Radiology;  Laterality: N/A;  . RADIOLOGY WITH ANESTHESIA N/A 12/05/2017   Procedure: MRI WITH ANESTHESIA;  Surgeon: Radiologist, Medication, MD;  Location: [place];  Service: Radiology;  Laterality: N/A;  . SHOULDER OPEN ROTATOR CUFF REPAIR Bilateral   . TOTAL KNEE ARTHROPLASTY Right 2012  . TYMPANOSTOMY TUBE PLACEMENT Bilateral   . ULNAR NERVE TRANSPOSITION Left 05/14/2015   Procedure: LEFT ULNAR NERVE DECOMPRESSION AT THE ELBOW;  Surgeon: Jessy Oto, MD;  Location: [place];  Service: Orthopedics;  Laterality: Left;        Home Medications    Prior to Admission medications   Medication Sig Start Date End Date Taking? Authorizing Provider  Acetaminophen (TYLENOL) 325 MG CAPS Take by mouth as needed.     [provider]  alendronate (FOSAMAX) 70 MG tablet Take 1 tablet (70 mg total) by mouth once a week. Take with a full glass of water on an empty stomach. 05/13/18   Bo Merino, MD  aspirin EC 81 MG tablet Take 81 mg by mouth daily.    [provider]  atenolol (TENORMIN) 50 MG tablet Take by mouth. 03/30/17   [provider]  baclofen (LIORESAL) 10 MG tablet Take 0.5-1 tablets (5-10 mg total) by mouth 3 (three) times daily as needed for muscle spasms. 05/10/18   Hilts, Legrand Como, MD  Cholecalciferol (VITAMIN D3) 50000 units TABS Take by mouth.    [provider]  clopidogrel (PLAVIX) 75 MG tablet Take by mouth. 05/07/18   [provider]  Cranberry 400 MG CAPS Take by mouth.    [provider]  Evolocumab (REPATHA) 140 MG/ML SOSY Inject 140 mg into the skin every 14 (fourteen) days.    [provider]  finasteride (PROSCAR) 5 MG tablet Take by mouth. 12/28/17   [provider]  gabapentin (NEURONTIN) 300 MG capsule Take 1 capsule (300 mg total) by mouth 2 (two) times daily. 04/24/18   Louanne Skye,  Daleen Bo, MD  gabapentin (NEURONTIN) 300 MG capsule TAKE 1 CAPSULE BY MOUTH THREE TIMES DAILY 06/10/18   Jessy Oto, MD  linaclotide [facility]) 145 MCG CAPS capsule Take 1 capsule (145 mcg total) by mouth daily before breakfast. 06/12/18   Jessy Oto, MD  meloxicam (MOBIC) 7.5 MG tablet Take 7.5 mg by mouth daily. 11/28/17   [provider]  metoprolol tartrate (LOPRESSOR) 50 MG tablet Take 1 tablet (50 mg total) by mouth 2 (two) times daily. 12/17/17 12/17/18  Kinnie Feil, MD  Multiple Vitamin (MULTIVITAMIN WITH MINERALS) TABS tablet Take 1 tablet by mouth daily.    [provider]  naproxen sodium (ALEVE) 220 MG tablet Take 220 mg by mouth as needed.    [provider]  ondansetron (ZOFRAN) 4 MG tablet Take 1 tablet (4 mg total) by mouth every 8 (eight) hours as needed for nausea or vomiting. 06/12/18   Jessy Oto, MD  simethicone (MYLICON) 40 HQ/7.5FF drops Take 0.6 mLs (40 mg total) by mouth 4 (four) times daily as needed for flatulence. 06/12/18   Jessy Oto, MD  tamsulosin (FLOMAX) 0.4 MG CAPS capsule Take by mouth.    [provider]  Tapentadol HCl (NUCYNTA) 100 MG TABS Take one tablet every 6 hour prn pain 05/29/18    Jessy Oto, MD  Teriparatide, Recombinant, 600 MCG/2.4ML SOLN Inject 0.08 mLs (20 mcg total) into the skin daily. 06/10/18   Bo Merino, MD  Vitamin D, Ergocalciferol, (DRISDOL) 50000 units CAPS capsule Take 1 capsule (50,000 Units total) by mouth every 7 (seven) days. 03/07/18   Jessy Oto, MD  zolpidem (AMBIEN) 10 MG tablet Take by mouth as needed.  12/25/17   [provider]  zolpidem (AMBIEN) 10 MG tablet Take 1 tablet (10 mg total) by mouth at bedtime as needed for sleep. 05/29/18 06/28/18  Jessy Oto, MD    Family History Family History  Problem Relation Age of Onset  . CAD Brother   . Cancer Brother   . Alzheimer's disease Mother   . Healthy Daughter   . Healthy Daughter   . Diabetes Neg Hx     Social History Social History   Tobacco Use  . Smoking status: Former Smoker    Packs/day: 1.00    Years: 2.00    Pack years: 2.00    Last attempt to quit: 01/09/1957    Years since quitting: 61.4  . Smokeless tobacco: Never Used  Substance Use Topics  . Alcohol use: No  . Drug use: Never     Allergies   Ezetimibe; Morphine and related; and Statins   Review of Systems Review of Systems  Constitutional: Positive for chills. Negative for fever.  HENT: Negative for congestion and rhinorrhea.   Respiratory: Positive for cough (chronic). Negative for shortness of breath.   Cardiovascular: Negative for chest pain and leg swelling.  Gastrointestinal: Positive for abdominal pain, diarrhea and nausea. Negative for blood in stool, constipation and vomiting.  Genitourinary: Negative for dysuria, flank pain, frequency, hematuria and urgency.  Musculoskeletal: Positive for back pain.  Skin: Negative for color change and wound.  Neurological: Positive for numbness. Negative for weakness.     Physical Exam Updated Vital Signs BP 130/72   Pulse 80   Temp 97.7 F (36.5 C) (Oral)   Resp 18   SpO2 95%   Physical Exam  Constitutional: He appears  well-developed and well-nourished. He appears distressed.  HENT:  Head: Normocephalic and  atraumatic.  Eyes: Conjunctivae are normal.  Neck: Neck supple.  Cardiovascular: Normal rate, regular rhythm and normal heart sounds.  No murmur heard. Pulmonary/Chest: Effort normal and breath sounds normal. No stridor. No respiratory distress. He has no wheezes.  Abdominal: Soft.  TTP to LLQ with involuntary guarding and rebound TTP. No CVA TTP.   Musculoskeletal:  TTP to the lumbar spine over midline surgical scar. Mild erythema to superior and inferior portions of the scar without obvious fluid collection or warmth.   Neurological: He is alert.  5/5 strength to BLE throughout, abnormal sensation to bilat feet and to LLE to about the knee (h/o CVA residual deficit)  Skin: Skin is warm and dry.  Psychiatric: He has a normal mood and affect.  Nursing note and vitals reviewed.  ED Treatments / Results  Labs (all labs ordered are listed, but only abnormal results are displayed) Labs Reviewed  CBC WITH DIFFERENTIAL/PLATELET - Abnormal; Notable for the following components:      Result Value   Hemoglobin 12.9 (*)    All other components within normal limits  COMPREHENSIVE METABOLIC PANEL - Abnormal; Notable for the following components:   Glucose, Bld 145 (*)    Albumin 3.4 (*)    Alkaline Phosphatase 171 (*)    Total Bilirubin 1.5 (*)    All other components within normal limits  SEDIMENTATION RATE - Abnormal; Notable for the following components:   Sed Rate 44 (*)    All other components within normal limits  C-REACTIVE PROTEIN - Abnormal; Notable for the following components:   CRP 1.6 (*)    All other components within normal limits  CULTURE, BLOOD (ROUTINE X 2)  CULTURE, BLOOD (ROUTINE X 2)  LIPASE, BLOOD  URINALYSIS, ROUTINE W REFLEX MICROSCOPIC  CBC  CREATININE, SERUM  BASIC METABOLIC PANEL  CBC    EKG None  Radiology Mr Lumbar Spine Wo Contrast  Result Date:  06/12/2018 CLINICAL DATA:  83 year old male with increased lumbar spine pain status post fusion 6 months ago. Fractures/collapse of the L3 vertebral body. EXAM: MRI LUMBAR SPINE WITHOUT CONTRAST TECHNIQUE: Multiplanar, multisequence MR imaging of the lumbar spine was performed. No intravenous contrast was administered. COMPARISON:  CT lumbar Spine [facility] 05/14/2018. Lumbar MRI 12/05/2017, and earlier. FINDINGS: Segmentation: Hypoplastic ribs assumed T12 as on the CT and April MRI numbering. Alignment: Straightening of lumbar lordosis is stable from last month. Vertebrae: Confluent marrow edema from the posterior inferior L2 body, throughout the L3 body, and into the superior and posterior L4 body. See series 7, image 9. This has progressed from the L3-L4 level endplate edema seen in April, with moderate collapse of L3 and erosion of the L3-L4 endplates since the prior MRI. Interbody implant at L3-L4. New abnormal increased T2 and STIR signal in the L2-L3 disc, with new L2 endplate and body marrow edema since April. The L1 and L5 levels appear stable and intact. Mild chronic T12 inferior endplate compression is stable. Intact visible lower thoracic levels. Intact visible sacrum and SI joints. Conus medullaris and cauda equina: Conus extends to the T12-L1 level. No lower spinal cord or conus signal abnormality. Paraspinal and other soft tissues: Confluent left greater than right medial psoas muscle edema as seen on series 6, image 13 and series 5, images 17 (on the left) and 1 (on the right). No intramuscular fluid collection is identified. The STIR images demonstrate prominent marrow edema in the L3 transverse processes. Superimposed postoperative changes to the lower lumbar  erector spinae muscles. Negative visible abdominal viscera. Disc levels: No Lower spinal stenosis above L2. L2-L3: New abnormal increased T2 and STIR signal within the disc. Progressed circumferential disc bulge with  chronic mild to moderate facet and ligament flavum hypertrophy. New mild to moderate spinal stenosis. Chronic mild to moderate L2 foraminal stenosis greater on the right. L3-L4: Posterior and interbody fusion hardware here and prior decompression. No spinal stenosis. There appears to be soft tissue edema affecting the bilateral L3 neural foramina. L4-L5: Posterior and interbody fusion hardware plus decompression here. Mild residual right L4 foraminal stenosis related to facet hypertrophy. L5-S1: Stable and negative aside from mild facet hypertrophy. IMPRESSION: 1. Constellation of imaging findings since the 12/05/2017 MRI is most compatible with prolonged Discitis Osteomyelitis at L3-L4, and extension of Discitis Osteomyelitis to L2-L3 since April. There is bilateral L3 through L5 spinal fusion hardware in place. Associated partial collapse of L3 with L3 and L4 endplate erosion. Abnormal L2-L3 disc and L2 inferior endplates now. 2. Bilateral psoas muscle edema/inflammation. No intramuscular or epidural abscess identified. 3. Multifactorial mild to moderate spinal stenosis at L2-L3 is new since April. Electronically Signed   By: Genevie Ann M.D.   On: 06/12/2018 16:14   Ct Abdomen Pelvis W Contrast  Result Date: 06/12/2018 CLINICAL DATA:  Left lower quadrant pain with diarrhea. EXAM: CT ABDOMEN AND PELVIS WITH CONTRAST TECHNIQUE: Multidetector CT imaging of the abdomen and pelvis was performed using the standard protocol following bolus administration of intravenous contrast. CONTRAST:  100 cc Omnipaque 300 intravenous COMPARISON:  MRI of the lumbar spine from the same day FINDINGS: Lower chest:  Coronary calcification.  No acute finding Hepatobiliary: No focal liver abnormality.Single stone within the gallbladder which is full but shows no inflammatory changes. Mild prominence of the biliary tree without calcified choledocholithiasis Pancreas: Unremarkable. Spleen: Unremarkable. 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 No hydronephrosis or stone. Subcentimeter low-densities in the left kidney, too small for densitometry. Unremarkable bladder. Stomach/Bowel:  No obstruction. No appendicitis. Vascular/Lymphatic: No acute vascular abnormality. Extensive atherosclerotic calcification of the aorta. No mass or adenopathy. Reproductive:Probable central prostate nodule.  No acute finding. Other: No ascites or pneumoperitoneum. Musculoskeletal: L2-3 and L3-4 discitis osteomyelitis. There has been L3-4 PLIF. The surrounding paravertebral fat is edematous. The pedicle screws at L3 and L4 show halo in/loosening. No evident collection. There is subsequent MRI available. IMPRESSION: 1. L2-3 and L3-4 discitis osteomyelitis which involves L3-4 PLIF hardware. 2. No evidence of colitis or other inflammation to explain diarrhea. 3. Cholelithiasis. Electronically Signed   By: Monte Fantasia M.D.   On: 06/12/2018 18:13    Procedures Procedures (including critical care time)  Medications Ordered in ED Medications  aspirin EC tablet 81 mg (has no administration in time range)  metoprolol tartrate (LOPRESSOR) tablet 50 mg (has no administration in time range)  zolpidem (AMBIEN) tablet 10 mg (has no administration in time range)  ondansetron (ZOFRAN) tablet 4 mg (has no administration in time range)  simethicone (MYLICON) 40 VW/0.9WJ suspension 40 mg (has no administration in time range)  finasteride (PROSCAR) tablet 5 mg (has no administration in time range)  tamsulosin (FLOMAX) capsule 0.4 mg (has no administration in time range)  clopidogrel (PLAVIX) tablet 75 mg (has no administration in time range)  baclofen (LIORESAL) tablet 5-10 mg (has no administration in time range)  enoxaparin (LOVENOX) injection 40 mg (has no administration in time range)  dextrose 5 %-0.9 % sodium chloride infusion (has no administration in time range)  morphine 4 MG/ML injection 4 mg (4 mg Intravenous Given 06/12/18 1314)   ondansetron (ZOFRAN) injection 4 mg (4 mg Intravenous Given 06/12/18 1313)  HYDROmorphone (DILAUDID) injection 0.5 mg (0.5 mg Intravenous Given 06/12/18 1415)  LORazepam (ATIVAN) injection 1 mg (1 mg Intravenous Given 06/12/18 1507)  HYDROmorphone (DILAUDID) injection 1 mg (1 mg Intravenous Given 06/12/18 1439)  iohexol (OMNIPAQUE) 300 MG/ML solution 100 mL (100 mLs Intravenous Contrast Given 06/12/18 1721)  sodium chloride 0.9 % bolus 500 mL (0 mLs Intravenous Stopped 06/12/18 1828)  HYDROmorphone (DILAUDID) injection 0.5 mg (0.5 mg Intravenous Given 06/12/18 1852)     Initial Impression / Assessment and Plan / ED Course  I have reviewed the triage vital signs and the nursing notes.  Pertinent labs & imaging results that were available during my care of the patient were reviewed by me and considered in my medical decision making (see chart for details).     Final Clinical Impressions(s) / ED Diagnoses   Final diagnoses:  Osteomyelitis, unspecified site, unspecified type [facility])   Presented the ED today complaining of worsening of his chronic lower back pain.  Sent from prior to arrival who was concerned for osteomyelitis.  Also with left lower quadrant abdominal pain and diarrhea.  History of diverticulosis.  No fevers here, vital signs stable.  12:41 PM CONSULT with Dr. Louanne Skye, the patient's orthopedist, who states that patient's pain is increased significantly this week and he advised the patient to be seen in the ED.  He recommended ordering an MRI without contrast to rule out osteomyelitis.  Also recommended ordering blood cultures, sed rate, CRP.  CBC without leukocytosis.  Mild anemia that is stable.  CMP with elevated alk phos and elevated total bilirubin.  Bilirubin appears consistent with prior.  Normal kidney function and liver function.  Grossly normal electrolytes.  Lipase is normal.  Sed rate and CRP are both elevated at 44 and 1.6 respectively.  Increased from prior that  were drawn 6 months ago.  UA is still pending at this time.  CT scan of the abdomen pelvis negative for diverticulitis or other acute abdominal abnormality at this time.  Did show well 2-3 and L3-4 discitis osteomyelitis which involves a 3-4 PLIF hardware.  MRI of the lumbar spine suggestive of prolonged discitis osteomyelitis at 3-4 with extension to L2-3.  Also with bilateral psoas muscle edema/inflammation without epidural abscess identified.  4:51 PM CONSULT with Dr. Durward Fortes with orthopedics, who will inform Dr. Louanne Skye of pts imaging read and of plan for admission.  Discussed patient case with Dr. Tamala Julian and he will admit the patient to the hospitalist service.  ED Discharge Orders    None       Bishop Dublin 06/12/18 Doran Heater    Sherwood Gambler, MD 06/12/18 2211

## 2018-06-12 NOTE — Patient Instructions (Signed)
I recommend starting Linzess to decrease tendency for constipation with the use of narcotics Continue with medications for pain. You will need intervention as you are not tolerating the pain associated with the  Loosening of hardware and compression fracture at L3. The hardware will need to be extended to the L1 level. I would also recommend open biopsy of the L3 level during this procedure.  If you are unable to tolerate the discomfort then you should proceed to the emergency room at [facility]. There evaluation of the back including MRI and blood cultures and laboratory will need to be carried out to assess for signs of infection.

## 2018-06-12 NOTE — ED Triage Notes (Signed)
Pt in c/o increasing back pain, sent from orthopedic for further evaluation and possible MRI, no neuro deficits noted

## 2018-06-12 NOTE — Progress Notes (Signed)
Office Visit Note   Patient: Jeffrey Nelson           Date of Birth: [birth year]           MRN: [id number] Visit Date: 06/12/2018              Requested by: Deloris Ping, MD [address] [place], [place] [address] PCP: Deloris Ping, MD   Assessment & Plan: Visit Diagnoses:  1. Closed compression fracture of L3 lumbar vertebra with nonunion, subsequent encounter   2. Status post lumbar spinal fusion   3. Acute midline low back pain with left-sided sciatica     Plan: I recommend starting Linzess to decrease tendency for constipation with the use of narcotics Continue with medications for pain. You will need intervention as you are not tolerating the pain associated with the  Loosening of hardware and compression fracture at L3. The hardware will need to be extended to the L1 level. I would also recommend open biopsy of the L3 level during this procedure.  If you are unable to tolerate the discomfort then you should proceed to the emergency room at [facility]. There evaluation of the back including MRI and blood cultures and laboratory will need to be carried out to assess for signs of infection.      Follow-Up Instructions: Return in about 4 weeks (around 07/10/2018).   Orders:  Orders Placed This Encounter  Procedures  . XR Lumbar Spine 2-3 Views   Meds ordered this encounter  Medications  . linaclotide (LINZESS) 145 MCG CAPS capsule    Sig: Take 1 capsule (145 mcg total) by mouth daily before breakfast.    Dispense:  30 capsule    Refill:  0  . simethicone (MYLICON) 40 MG/0.6ML drops    Sig: Take 0.6 mLs (40 mg total) by mouth 4 (four) times daily as needed for flatulence.    Dispense:  30 mL    Refill:  0  . ketorolac (TORADOL) 30 MG/ML injection 30 mg  . ondansetron (ZOFRAN) 4 MG tablet    Sig: Take 1 tablet (4 mg total) by mouth every 8 (eight) hours as needed for nausea or vomiting.    Dispense:  20 tablet    Refill:  0       Procedures: No procedures performed   Clinical Data: No additional findings.   Subjective: Chief Complaint  Patient presents with  . Lower Back - Pain    HPI  Review of Systems  Constitutional: Negative.  Negative for activity change, appetite change, chills, diaphoresis, fatigue, fever and unexpected weight change.  HENT: Negative.   Eyes: Negative.   Respiratory: Negative.   Cardiovascular: Negative.   Gastrointestinal: Negative.   Endocrine: Negative.   Genitourinary: Negative.   Musculoskeletal: Negative.   Skin: Negative.   Allergic/Immunologic: Negative.   Neurological: Negative.   Hematological: Negative.   Psychiatric/Behavioral: Negative.      Objective: Vital Signs: BP 104/65   Pulse 76   Temp 98.7 F (37.1 C)   Ht 6\' 2"  (1.88 m)   Wt 219 lb (99.3 kg)   BMI 28.12 kg/m   Physical Exam  Back Exam   Tenderness  The patient is experiencing tenderness in the lumbar.  Muscle Strength  Right Quadriceps:  5/5  Left Quadriceps:  5/5  Right Hamstrings:  5/5  Left Hamstrings:  5/5   Tests  Straight leg raise right: negative Straight leg raise left: negative  Reflexes  Patellar: normal Achilles: normal Babinski's sign: normal   Other  Toe walk: normal Heel walk: normal Sensation: normal Gait: normal  Erythema: no back redness Scars: absent      Specialty Comments:  No specialty comments available.  Imaging: No results found.   PMFS History: Patient Active Problem List   Diagnosis Date Noted  . Anemia due to blood loss 12/13/2017    Priority: High    Class: Chronic  . Lumbar disc herniation with radiculopathy 10/13/2017    Priority: High    Class: Acute  . Radiculopathy, lumbar region 10/09/2017    Priority: High  . Pain in right hip 10/09/2017    Priority: High    Class: Acute  . Right knee sprain 10/09/2017    Priority: High    Class: Acute  . Spinal stenosis in cervical region 05/14/2015    Priority: High      Class: Chronic  . Cubital tunnel syndrome on left 05/14/2015    Priority: High    Class: Chronic  . Spinal stenosis, lumbar region, with neurogenic claudication 01/16/2014    Priority: High    Class: Chronic  . Diabetes mellitus type 2, uncomplicated (HCC) 10/14/2017    Priority: Medium    Class: Chronic  . Lumbosacral radiculopathy at L3   . Tachycardia 12/14/2017  . Leukocytosis 12/14/2017  . Syncope 12/03/2017  . Dehydration 12/03/2017  . Chronic back pain 12/03/2017  . Hypotension 12/03/2017  . Subclavian artery stenosis, right (HCC) 12/03/2017  . Status post lumbar laminectomy 11/12/2017  . Postoperative pain after spinal surgery 11/11/2017  . Weakness of right lower extremity   . Sciatica associated with disorder of lumbosacral spine   . Surgery, elective   . Hip pain, acute, right 10/09/2017  . Bradycardia 10/09/2017  . Spondylolisthesis, lumbar region 10/05/2017  . Knee contusion 07/24/2016  . Stenosis of cervical spine with myelopathy (HCC) 05/14/2015   Past Medical History:  Diagnosis Date  . Anginal pain (HCC)    occ  . Arthritis    "joints" (10/09/2017)  . Chronic lower back pain   . Complication of anesthesia    extremely claustrophobic; "have to be put down for MRI; don't sit in back seat of car;, etc."  . Coronary artery disease   . High cholesterol    "get shots twice/month" (10/09/2017)  . History of blood transfusion 2001   "related to CABG"  . History of kidney stones   . HOH (hard of hearing)    wearing hearing aids  . Hypertension   . Myocardial infarction (HCC) 2000s-06/2017 X 4  . Stroke [facility]) ~ 2015   "in my right eye; sight is coming back little by little" (10/09/2017)  . Type II diabetes mellitus (HCC)     Family History  Problem Relation Age of Onset  . CAD Brother   . Cancer Brother   . Alzheimer's disease Mother   . Healthy Daughter   . Healthy Daughter   . Diabetes Neg Hx     Past Surgical History:  Procedure Laterality Date   . ANTERIOR CERVICAL DECOMP/DISCECTOMY FUSION N/A 05/14/2015   Procedure: C3-4  ANTERIOR CERVICAL DISCECTOMY AND FUSION WITH PLATE AND SCREWS, LOCAL AND ALLOGRAFT BONE GRAFT;  Surgeon: Kerrin Champagne, MD;  Location: [facility] OR;  Service: Orthopedics;  Laterality: N/A;  . BACK SURGERY    . CARDIAC CATHETERIZATION     X 17 (10/09/2017)  . CATARACT EXTRACTION W/ INTRAOCULAR LENS  IMPLANT, BILATERAL Bilateral   . COLONOSCOPY    .  CORONARY ANGIOPLASTY  06/2017  . CORONARY ANGIOPLASTY WITH STENT PLACEMENT     "6 stents total" (10/09/2017)  . CORONARY ARTERY BYPASS GRAFT  2001   CABG x4   . CYSTOSCOPY W/ STONE MANIPULATION    . JOINT REPLACEMENT    . LUMBAR LAMINECTOMY Right 10/13/2017   Procedure: MICRODISCECTOMY LUMBAR LAMINECTOMY RIGHT L3-4;  Surgeon: Kerrin Champagne, MD;  Location: [facility] OR;  Service: Orthopedics;  Laterality: Right;  . LUMBAR LAMINECTOMY Left 11/12/2017   Procedure: MICRODISCECTOMY L3-4 FOR RECURRENT HNP POSSIBLE FLURO;  Surgeon: Kerrin Champagne, MD;  Location: [facility] OR;  Service: Orthopedics;  Laterality: Left;  . LUMBAR LAMINECTOMY/DECOMPRESSION MICRODISCECTOMY N/A 01/16/2014   Procedure: Left L3-4 and L4-5 foraminotomies;  Surgeon: Kerrin Champagne, MD;  Location: [facility] OR;  Service: Orthopedics;  Laterality: N/A;  . NASAL FRACTURE SURGERY    . NASAL SINUS SURGERY     "several times since 1959"  . RADIOLOGY WITH ANESTHESIA N/A 10/12/2017   Procedure: MRI WITH ANESTHESIA;  Surgeon: Radiologist, Medication, MD;  Location: [facility] OR;  Service: Radiology;  Laterality: N/A;  . RADIOLOGY WITH ANESTHESIA N/A 12/05/2017   Procedure: MRI WITH ANESTHESIA;  Surgeon: Radiologist, Medication, MD;  Location: [facility] OR;  Service: Radiology;  Laterality: N/A;  . SHOULDER OPEN ROTATOR CUFF REPAIR Bilateral   . TOTAL KNEE ARTHROPLASTY Right 2012  . TYMPANOSTOMY TUBE PLACEMENT Bilateral   . ULNAR NERVE TRANSPOSITION Left 05/14/2015   Procedure: LEFT ULNAR NERVE DECOMPRESSION AT THE ELBOW;  Surgeon: Kerrin Champagne, MD;   Location: [facility] OR;  Service: Orthopedics;  Laterality: Left;   Social History   Occupational History  . Not on file  Tobacco Use  . Smoking status: Former Smoker    Packs/day: 1.00    Years: 2.00    Pack years: 2.00    Last attempt to quit: 01/09/1957    Years since quitting: 61.4  . Smokeless tobacco: Never Used  Substance and Sexual Activity  . Alcohol use: No  . Drug use: Never  . Sexual activity: Not Currently

## 2018-06-12 NOTE — ED Notes (Signed)
Patient transported to MRI 

## 2018-06-12 NOTE — ED Notes (Signed)
Dr. Otelia Sergeant going out of town but can call him, [phone]. Dr. Cleophas Dunker is on call

## 2018-06-12 NOTE — ED Notes (Signed)
ED Provider at bedside. 

## 2018-06-12 NOTE — ED Notes (Signed)
Pt informed he needs to provide urine when possible. Pt provided with urinal at bedside. Pt also informed that we will be collecting additional bloodwork. Pt verbalized understanding.

## 2018-06-13 LAB — BASIC METABOLIC PANEL
Anion gap: 7 (ref 5–15)
BUN: 14 mg/dL (ref 8–23)
CALCIUM: 8.7 mg/dL — AB (ref 8.9–10.3)
CHLORIDE: 107 mmol/L (ref 98–111)
CO2: 23 mmol/L (ref 22–32)
CREATININE: 1.04 mg/dL (ref 0.61–1.24)
GFR calc non Af Amer: >60 mL/min (ref >60)
Glucose, Bld: 119 mg/dL — ABNORMAL HIGH (ref 70–99)
Potassium: 4.3 mmol/L (ref 3.5–5.1)
SODIUM: 137 mmol/L (ref 135–145)

## 2018-06-13 LAB — GLUCOSE, CAPILLARY
GLUCOSE-CAPILLARY: 99 mg/dL (ref 70–99)
GLUCOSE-CAPILLARY: 142 mg/dL — AB (ref 70–99)
Glucose-Capillary: 108 mg/dL — ABNORMAL HIGH (ref 70–99)
Glucose-Capillary: 128 mg/dL — ABNORMAL HIGH (ref 70–99)

## 2018-06-13 LAB — CBC
HEMATOCRIT: 37.9 % — AB (ref 39.0–52.0)
Hemoglobin: 12.1 g/dL — ABNORMAL LOW (ref 13.0–17.0)
MCH: 30.6 pg (ref 26.0–34.0)
MCHC: 31.9 g/dL (ref 30.0–36.0)
MCV: 95.7 fL (ref 80.0–100.0)
NRBC: 0 % (ref 0.0–0.2)
Platelets: 308 10*3/uL (ref 150–400)
RBC: 3.96 MIL/uL — ABNORMAL LOW (ref 4.22–5.81)
RDW: 13.7 % (ref 11.5–15.5)
WBC: 7.2 10*3/uL (ref 4.0–10.5)

## 2018-06-13 MED ORDER — ENSURE ENLIVE PO LIQD
237.0000 mL | Freq: Two times a day (BID) | ORAL | Status: DC
  Administered 2018-06-13 – 2018-06-17 (×7): 237 mL via ORAL

## 2018-06-13 MED ORDER — CLOPIDOGREL BISULFATE 75 MG PO TABS: 75.0000 mg | ORAL_TABLET | Freq: Every day | ORAL | Status: DC

## 2018-06-13 MED ORDER — ENOXAPARIN SODIUM 40 MG/0.4ML ~~LOC~~ SOLN
40.0000 mg | SUBCUTANEOUS | Status: DC
  Administered 2018-06-14 – 2018-06-19 (×6): 40 mg via SUBCUTANEOUS
  Filled 2018-06-13 (×6): qty 0.4

## 2018-06-13 MED ORDER — ZOLPIDEM TARTRATE 5 MG PO TABS
5.0000 mg | ORAL_TABLET | Freq: Once | ORAL | Status: AC
  Administered 2018-06-13: 5 mg via ORAL
  Filled 2018-06-13: qty 1

## 2018-06-13 NOTE — Consult Note (Signed)
[facility]  Total days of antibiotics 2               Reason for Consult: discitis    Referring Physician: [place] Pink  Active Problems:   Spinal stenosis, lumbar region, with neurogenic claudication   Stenosis of cervical spine with myelopathy ([place])   Diabetes mellitus type 2, uncomplicated ([place])   Osteomyelitis ([place])   Essential hypertension   BPH (benign prostatic hyperplasia)    HPI: Jeffrey Nelson is a 83 y.o. male with PMHX of DM, CAD s/p CABG, HTn, dyslipidemia, osteoporosis and hx  compression fracture of L3 lumbar vertebra with nonunion, status post lumbar spinal fusion April 2019 by dr Louanne Skye. Patient reports that he went to [facility] hospital for evaluation on 9/2 and sustained back pain after they moved his legs around. But it has steadily worsened in the last t 2 weeks  worse with movement and lying supine,pluys in the last 3-4 days, he has started to have subjective fever, nightsweats, temperature dysregulation.   Patient was seen in the orthopedic clinic by Dr. Louanne Skye, October 2 for worsening back pain, patient received tapentadol, and instructions to avoid frequent bending, and heavy lifting.  A bone scan was ordered and spine x-rays.  He was diagnosed with hardware loosening and compression fracture of L3.  due to worsening pain, he was sent to the ED. He is afebrile, with normal WBC, but imaging of spine shows signs of inflammation and discitis to L2-L3-L4. He was started on vancomycin and ceftriaxone  MRI:  1. Constellation of imaging findings since the 12/05/2017 MRI is most compatible with prolonged Discitis Osteomyelitis at L3-L4, and extension of Discitis Osteomyelitis to L2-L3 since April. There is bilateral L3 through L5 spinal fusion hardware in place. Associated partial collapse of L3 with L3 and L4 endplate erosion. Abnormal L2-L3 disc and L2 inferior endplates now. 2. Bilateral psoas muscle edema/inflammation. No intramuscular or epidural  abscess identified. 3. Multifactorial mild to moderate spinal stenosis at L2-L3 is new since April. Past Medical History:  Diagnosis Date  . Anginal pain ([place])    occ  . Arthritis    "joints" (10/09/2017)  . Chronic lower back pain   . Complication of anesthesia    extremely claustrophobic; "have to be put down for MRI; don't sit in back seat of car;, etc."  . Coronary artery disease   . High cholesterol    "get shots twice/month" (10/09/2017)  . History of blood transfusion 2001   "related to CABG"  . History of kidney stones   . HOH (hard of hearing)    wearing hearing aids  . Hypertension   . Myocardial infarction ([place]) 2000s-06/2017 X 4  . Stroke [facility]) ~ 2015   "in my right eye; sight is coming back little by little" (10/09/2017)  . Type II diabetes mellitus (H[facility])     Allergies:  Allergies  Allergen Reactions  . Ezetimibe Other (See Comments)    Cramps Cramps  . Statins Other (See Comments)    Cramps Cramps    MEDICATIONS: . acetaminophen  650 mg Oral Q6H  . aspirin EC  81 mg Oral Daily  . clopidogrel  75 mg Oral Daily  . enoxaparin (LOVENOX) injection  40 mg Subcutaneous Q24H  . finasteride  5 mg Oral Daily  . insulin aspart  0-9 Units Subcutaneous TID WC  . metoprolol tartrate  25 mg Oral BID  . pantoprazole  40 mg Oral Daily  . polyethylene  glycol  17 g Oral Daily  . tamsulosin  0.4 mg Oral QPC supper    Social History   Tobacco Use  . Smoking status: Former Smoker    Packs/day: 1.00    Years: 2.00    Pack years: 2.00    Last attempt to quit: 01/09/1957    Years since quitting: 61.4  . Smokeless tobacco: Never Used  Substance Use Topics  . Alcohol use: No  . Drug use: Never    Family History  Problem Relation Age of Onset  . CAD Brother   . Cancer Brother   . Alzheimer's disease Mother   . Healthy Daughter   . Healthy Daughter   . Diabetes Neg Hx     Review of Systems  Constitutional: Negative for fever, chills, diaphoresis, activity  change, appetite change, fatigue and unexpected weight change.  HENT: Negative for congestion, sore throat, rhinorrhea, sneezing, trouble swallowing and sinus pressure.  Eyes: Negative for photophobia and visual disturbance.  Respiratory: Negative for cough, chest tightness, shortness of breath, wheezing and stridor.  Cardiovascular: Negative for chest pain, palpitations and leg swelling.  Gastrointestinal: Negative for nausea, vomiting, diarrhea but having abd distention and constipation Genitourinary: Negative for dysuria, hematuria, flank pain and difficulty urinating.  Musculoskeletal: Negative for myalgias, back pain, joint swelling, arthralgias and gait problem.  Skin: Negative for color change, pallor, rash and wound.  Neurological: Negative for dizziness, tremors, weakness and light-headedness.  Hematological: Negative for adenopathy. Does not bruise/bleed easily.  Psychiatric/Behavioral: Negative for behavioral problems, confusion, sleep disturbance, dysphoric mood, decreased concentration and agitation.     OBJECTIVE: Temp:  [97.8 F (36.6 C)-98.6 F (37 C)] 97.8 F (36.6 C) (10/17 0748) Pulse Rate:  [67-80] 73 (10/17 0748) Resp:  [16-18] 18 (10/17 0748) BP: (111-148)/(61-85) 113/66 (10/17 0748) SpO2:  [94 %-100 %] 96 % (10/17 0748) Weight:  [93 kg] 93 kg (10/16 2041) Physical Exam  Constitutional: He is oriented to person, place, and time. He appears well-developed and well-nourished. No distress.  HENT:  Mouth/Throat: Oropharynx is clear and moist. No oropharyngeal exudate.  Cardiovascular: Normal rate, regular rhythm and normal heart sounds. Exam reveals no gallop and no friction rub.  No murmur heard.  Pulmonary/Chest: Effort normal and breath sounds normal. No respiratory distress. He has no wheezes.  Abdominal: Soft. Bowel sounds are normal. He exhibits no distension. There is no tenderness.  Lymphadenopathy:  He has no cervical adenopathy.  Neurological: He is  alert and oriented to person, place, and time.  Skin: Skin is warm and dry. No rash noted. No erythema.  Psychiatric: He has a normal mood and affect. His behavior is normal.    LABS: Results for orders placed or performed during the hospital encounter of 06/12/18 (from the past 48 hour(s))  CBC with Differential     Status: Abnormal   Collection Time: 06/12/18  1:08 PM  Result Value Ref Range   WBC 8.6 4.0 - 10.5 K/uL   RBC 4.34 4.22 - 5.81 MIL/uL   Hemoglobin 12.9 (L) 13.0 - 17.0 g/dL   HCT 41.1 39.0 - 52.0 %   MCV 94.7 80.0 - 100.0 fL   MCH 29.7 26.0 - 34.0 pg   MCHC 31.4 30.0 - 36.0 g/dL   RDW 13.6 11.5 - 15.5 %   Platelets 324 150 - 400 K/uL   nRBC 0.0 0.0 - 0.2 %   Neutrophils Relative % 77 %   Neutro Abs 6.5 1.7 - 7.7 K/uL   Lymphocytes Relative  12 %   Lymphs Abs 1.1 0.7 - 4.0 K/uL   Monocytes Relative 10 %   Monocytes Absolute 0.9 0.1 - 1.0 K/uL   Eosinophils Relative 0 %   Eosinophils Absolute 0.0 0.0 - 0.5 K/uL   Basophils Relative 1 %   Basophils Absolute 0.0 0.0 - 0.1 K/uL   Immature Granulocytes 0 %   Abs Immature Granulocytes 0.02 0.00 - 0.07 K/uL    Comment: Performed at [facility], [place] [address] [place], [place] [address]  Comprehensive metabolic panel     Status: Abnormal   Collection Time: 06/12/18  1:08 PM  Result Value Ref Range   Sodium 137 135 - 145 mmol/L   Potassium 3.8 3.5 - 5.1 mmol/L   Chloride 102 98 - 111 mmol/L   CO2 27 22 - 32 mmol/L   Glucose, Bld 145 (H) 70 - 99 mg/dL   BUN 11 8 - 23 mg/dL   Creatinine, Ser 1.03 0.61 - 1.24 mg/dL   Calcium 9.5 8.9 - 10.3 mg/dL   Total Protein 7.3 6.5 - 8.1 g/dL   Albumin 3.4 (L) 3.5 - 5.0 g/dL   AST 20 15 - 41 U/L   ALT 22 0 - 44 U/L   Alkaline Phosphatase 171 (H) 38 - 126 U/L   Total Bilirubin 1.5 (H) 0.3 - 1.2 mg/dL   GFR calc non Af Amer >60 >60 mL/min   GFR calc Af Amer >60 >60 mL/min    Comment: (NOTE) The eGFR has been calculated using the CKD EPI equation. This calculation has not  been validated in all clinical situations. eGFR's persistently <60 mL/min signify possible Chronic Kidney Disease.    Anion gap 8 5 - 15    Comment: Performed at [facility] [address], [place], [place] [address]  Lipase, blood     Status: None   Collection Time: 06/12/18  1:08 PM  Result Value Ref Range   Lipase 22 11 - 51 U/L    Comment: Performed at [facility] [address], [place], [place] [address]  Sedimentation rate     Status: Abnormal   Collection Time: 06/12/18  1:08 PM  Result Value Ref Range   Sed Rate 44 (H) 0 - 16 mm/hr    Comment: Performed at [facility] [address], [place], [place] [address]  C-reactive protein     Status: Abnormal   Collection Time: 06/12/18  1:08 PM  Result Value Ref Range   CRP 1.6 (H) <1.0 mg/dL    Comment: Performed at [facility] [address], [place], [place] [address]  Blood culture (routine x 2)     Status: None (Preliminary result)   Collection Time: 06/12/18  1:28 PM  Result Value Ref Range   Specimen Description BLOOD LEFT ANTECUBITAL    Special Requests      BOTTLES DRAWN AEROBIC AND ANAEROBIC Blood Culture adequate volume   Culture      NO GROWTH < 24 HOURS Performed at [facility], [place] [address], [place], [place] [address]    Report Status PENDING   Blood culture (routine x 2)     Status: None (Preliminary result)   Collection Time: 06/12/18  1:29 PM  Result Value Ref Range   Specimen Description BLOOD RIGHT ANTECUBITAL    Special Requests      BOTTLES DRAWN AEROBIC AND ANAEROBIC Blood Culture adequate volume   Culture  NO GROWTH < 24 HOURS Performed at [facility] [address] [place], [place] [address]    Report Status PENDING   Glucose, capillary     Status: Abnormal   Collection Time: 06/12/18  8:45 PM  Result Value Ref Range   Glucose-Capillary 162 (H) 70 - 99 mg/dL  Urinalysis, Routine w reflex microscopic     Status: Abnormal   Collection Time:  06/12/18 11:20 PM  Result Value Ref Range   Color, Urine YELLOW YELLOW   APPearance CLEAR CLEAR   Specific Gravity, Urine >1.046 (H) 1.005 - 1.030   pH 5.0 5.0 - 8.0   Glucose, UA NEGATIVE NEGATIVE mg/dL   Hgb urine dipstick NEGATIVE NEGATIVE   Bilirubin Urine NEGATIVE NEGATIVE   Ketones, ur NEGATIVE NEGATIVE mg/dL   Protein, ur NEGATIVE NEGATIVE mg/dL   Nitrite NEGATIVE NEGATIVE   Leukocytes, UA NEGATIVE NEGATIVE    Comment: Performed at [facility] [address], [place], [place] [address]  Basic metabolic panel     Status: Abnormal   Collection Time: 06/13/18  5:35 AM  Result Value Ref Range   Sodium 137 135 - 145 mmol/L   Potassium 4.3 3.5 - 5.1 mmol/L   Chloride 107 98 - 111 mmol/L   CO2 23 22 - 32 mmol/L   Glucose, Bld 119 (H) 70 - 99 mg/dL   BUN 14 8 - 23 mg/dL   Creatinine, Ser 1.04 0.61 - 1.24 mg/dL   Calcium 8.7 (L) 8.9 - 10.3 mg/dL   GFR calc non Af Amer >60 >60 mL/min   GFR calc Af Amer >60 >60 mL/min    Comment: (NOTE) The eGFR has been calculated using the CKD EPI equation. This calculation has not been validated in all clinical situations. eGFR's persistently <60 mL/min signify possible Chronic Kidney Disease.    Anion gap 7 5 - 15    Comment: Performed at [facility] [address] [place], [place] [address]  CBC     Status: Abnormal   Collection Time: 06/13/18  5:35 AM  Result Value Ref Range   WBC 7.2 4.0 - 10.5 K/uL   RBC 3.96 (L) 4.22 - 5.81 MIL/uL   Hemoglobin 12.1 (L) 13.0 - 17.0 g/dL   HCT 37.9 (L) 39.0 - 52.0 %   MCV 95.7 80.0 - 100.0 fL   MCH 30.6 26.0 - 34.0 pg   MCHC 31.9 30.0 - 36.0 g/dL   RDW 13.7 11.5 - 15.5 %   Platelets 308 150 - 400 K/uL   nRBC 0.0 0.0 - 0.2 %    Comment: Performed at [facility], [place] [address] [place], [place] [address]  Glucose, capillary     Status: Abnormal   Collection Time: 06/13/18  7:47 AM  Result Value Ref Range   Glucose-Capillary 108 (H) 70 - 99 mg/dL  Glucose, capillary      Status: Abnormal   Collection Time: 06/13/18 12:51 PM  Result Value Ref Range   Glucose-Capillary 128 (H) 70 - 99 mg/dL    MICRO: Blood cx at 24hr NGTD IMAGING: Mr Lumbar Spine Wo Contrast  Result Date: 06/12/2018 CLINICAL DATA:  83 year old male with increased lumbar spine pain status post fusion 6 months ago. Fractures/collapse of the L3 vertebral body. EXAM: MRI LUMBAR SPINE WITHOUT CONTRAST TECHNIQUE: Multiplanar, multisequence MR imaging of the lumbar spine was performed. No intravenous contrast was administered. COMPARISON:  CT lumbar Spine [facility] 05/14/2018. Lumbar MRI  12/05/2017, and earlier. FINDINGS: Segmentation: Hypoplastic ribs assumed T12 as on the CT and April MRI numbering. Alignment: Straightening of lumbar lordosis is stable from last month. Vertebrae: Confluent marrow edema from the posterior inferior L2 body, throughout the L3 body, and into the superior and posterior L4 body. See series 7, image 9. This has progressed from the L3-L4 level endplate edema seen in April, with moderate collapse of L3 and erosion of the L3-L4 endplates since the prior MRI. Interbody implant at L3-L4. New abnormal increased T2 and STIR signal in the L2-L3 disc, with new L2 endplate and body marrow edema since April. The L1 and L5 levels appear stable and intact. Mild chronic T12 inferior endplate compression is stable. Intact visible lower thoracic levels. Intact visible sacrum and SI joints. Conus medullaris and cauda equina: Conus extends to the T12-L1 level. No lower spinal cord or conus signal abnormality. Paraspinal and other soft tissues: Confluent left greater than right medial psoas muscle edema as seen on series 6, image 13 and series 5, images 17 (on the left) and 1 (on the right). No intramuscular fluid collection is identified. The STIR images demonstrate prominent marrow edema in the L3 transverse processes. Superimposed postoperative changes to the lower lumbar  erector spinae muscles. Negative visible abdominal viscera. Disc levels: No Lower spinal stenosis above L2. L2-L3: New abnormal increased T2 and STIR signal within the disc. Progressed circumferential disc bulge with chronic mild to moderate facet and ligament flavum hypertrophy. New mild to moderate spinal stenosis. Chronic mild to moderate L2 foraminal stenosis greater on the right. L3-L4: Posterior and interbody fusion hardware here and prior decompression. No spinal stenosis. There appears to be soft tissue edema affecting the bilateral L3 neural foramina. L4-L5: Posterior and interbody fusion hardware plus decompression here. Mild residual right L4 foraminal stenosis related to facet hypertrophy. L5-S1: Stable and negative aside from mild facet hypertrophy. IMPRESSION: 1. Constellation of imaging findings since the 12/05/2017 MRI is most compatible with prolonged Discitis Osteomyelitis at L3-L4, and extension of Discitis Osteomyelitis to L2-L3 since April. There is bilateral L3 through L5 spinal fusion hardware in place. Associated partial collapse of L3 with L3 and L4 endplate erosion. Abnormal L2-L3 disc and L2 inferior endplates now. 2. Bilateral psoas muscle edema/inflammation. No intramuscular or epidural abscess identified. 3. Multifactorial mild to moderate spinal stenosis at L2-L3 is new since April. Electronically Signed   By: Genevie Ann M.D.   On: 06/12/2018 16:14   Ct Abdomen Pelvis W Contrast  Result Date: 06/12/2018 CLINICAL DATA:  Left lower quadrant pain with diarrhea. EXAM: CT ABDOMEN AND PELVIS WITH CONTRAST TECHNIQUE: Multidetector CT imaging of the abdomen and pelvis was performed using the standard protocol following bolus administration of intravenous contrast. CONTRAST:  100 cc Omnipaque 300 intravenous COMPARISON:  MRI of the lumbar spine from the same day FINDINGS: Lower chest:  Coronary calcification.  No acute finding Hepatobiliary: No focal liver abnormality.Single stone within the  gallbladder which is full but shows no inflammatory changes. Mild prominence of the biliary tree without calcified choledocholithiasis Pancreas: Unremarkable. Spleen: Unremarkable. 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 No hydronephrosis or stone. Subcentimeter low-densities in the left kidney, too small for densitometry. Unremarkable bladder. Stomach/Bowel:  No obstruction. No appendicitis. Vascular/Lymphatic: No acute vascular abnormality. Extensive atherosclerotic calcification of the aorta. No mass or adenopathy. Reproductive:Probable central prostate nodule.  No acute finding. Other: No ascites or pneumoperitoneum. Musculoskeletal: L2-3 and L3-4 discitis osteomyelitis. There has been L3-4 PLIF. The surrounding paravertebral  fat is edematous. The pedicle screws at L3 and L4 show halo in/loosening. No evident collection. There is subsequent MRI available. IMPRESSION: 1. L2-3 and L3-4 discitis osteomyelitis which involves L3-4 PLIF hardware. 2. No evidence of colitis or other inflammation to explain diarrhea. 3. Cholelithiasis. Electronically Signed   By: Monte Fantasia M.D.   On: 06/12/2018 18:13   Assessment/Plan:  83yo M with hx of L3-L4 PLIF in April 2019 now found to have L2-L3 and L3-L4 discitis in setitng of new onset back pain over the last 2 weeks  - recommend to have IR evaluate to see if can do an aspirate of disc space to send for culture - plan on holding abtx for now - if unable to get aspirate, would do a culture negative regimen on vancomycin plus ceftriaxone 2gm IV daily - plan to treat for 6 wk  Constipation = may need to change his current regimen if he is requiring further pain medication.  Elzie Rings [facility] [phone]

## 2018-06-13 NOTE — Progress Notes (Signed)
Initial Nutrition Assessment  DOCUMENTATION CODES:   Not applicable  INTERVENTION:  Ensure Enlive BID. Each supplement provides 350 kcal and 20 grams protein. Patient likes chocolate flavor.  Liberalize diet to regular to encourage better po intake  NUTRITION DIAGNOSIS:   Increased nutrient needs related to acute illness(discitis with osteomyelitis) as evidenced by estimated needs.  GOAL:   Patient will meet greater than or equal to 90% of their needs  MONITOR:   PO intake, Supplement acceptance, Diet advancement  REASON FOR ASSESSMENT:   Malnutrition Screening Tool   ASSESSMENT:   Jeffrey Nelson is an 83 yo male with PMH of type 2 diabetes,  BPH, HTN, CAD w/ CABG, spinal stenosis, s/p lumbar laminectomy admitted for L2-L3, L3-L4 discitis osteomyelitis.   Dietitian student visited Jeffrey Nelson at bedside today with wife present. Patient reports decreased po intake during hospitalization due to not liking hospital food per his wife. PTA he had slightly decreased appetite due to pain, though wife says he will force himself to eat. Patient lives at home with wife and she cooks his meals.  Typical intake PTA: Breakfast: 2 eggs, bacon, sausage, or ham, with grits and whole wheat toast Lunch: soup and sandwich Dinner: meat and veg such as roast stew with cabbage, rice and gravy Snacks: nuts, chips, popcorn Beverages: water, ginger ale, pepsi  Vitamins: 5000 IU daily vitamin D  Per wife's report patient had significant pain since Feb 2019 after lumbar laminectomy. Patient has had reduced mobility due to pain; wife reports he spends most of the day sitting down.   Patient reports 50 lb intentional wt loss over last few years, though no unintentional wt loss. No notable fat or muscle depletions on physical exam.   Patient dissatisfied with heart healthy diet particularly that he is not able to get regular bacon/sausage in AM. He says that he would eat more if he could have foods on the  regular diet.   Patient received Ensure supplements during past admissions and is amenable to having them again to increase intake of protein and calories. He likes chocolate flavor.     Medications reviewed and include: SSI, Protonix, Miralax Labs reviewed: CBG 108-200  NUTRITION - FOCUSED PHYSICAL EXAM:    Most Recent Value  Orbital Region  No depletion  Upper Arm Region  No depletion  Thoracic and Lumbar Region  No depletion  Buccal Region  No depletion  Temple Region  No depletion  Clavicle Bone Region  No depletion  Clavicle and Acromion Bone Region  No depletion  Scapular Bone Region  No depletion  Dorsal Hand  No depletion  Patellar Region  No depletion  Anterior Thigh Region  No depletion  Posterior Calf Region  No depletion  Edema (RD Assessment)  None  Hair  Reviewed  Eyes  Reviewed  Mouth  Reviewed  Skin  Reviewed  Nails  Reviewed      Diet Order:   Diet Order            Diet heart healthy/carb modified Room service appropriate? Yes; Fluid consistency: Thin  Diet effective now              EDUCATION NEEDS:   No education needs have been identified at this time  Skin:  Skin Assessment: Reviewed RN Assessment  Last BM:  10/16  Height:   Ht Readings from Last 1 Encounters:  06/12/18 6\' 2"  (1.88 m)    Weight:   Wt Readings from Last 1 Encounters:  06/12/18  93 kg    Ideal Body Weight:  86.4 kg  BMI:  Body mass index is 26.32 kg/m.  Estimated Nutritional Needs:   Kcal:  2100-2300  Protein:  105-115  Fluid:  >2.0 L   Jeffrey Nelson, Dietetic Intern [phone]

## 2018-06-13 NOTE — Progress Notes (Signed)
Jeffrey Campbell, MD   [name], PA-C [address], [place], [place]  [address]                             [phone]   ORTHOPAEDIC CONSULTATION  Jeffrey Nelson            MRN:  [id number] DOB/SEX:  09-10-1934/male    REQUESTING PHYSICIAN:    CHIEF COMPLAINT:  Painful lumbar spine  HISTORY: Jeffrey Nelson is an 83 y.o. male diabetic with 3 prior lumbar spine surgical procedures since Jan. By film there is evidence of hardware loosening and L3 compression fx. Seen by Dr Otelia Sergeant yesterday in the office with persistent pain and referred ro the ER for further evaluation and MRI scan to r/o osteomyelitis. Scan demonstrated discitis/osteomyelitis at L3-L4 with extension to L2-L3 since April. There is fusion hardware from L3-L5. No evidence of abscess. Admitted for medical evaluation, antibiotics and ID referral.   PAST MEDICAL HISTORY: Patient Active Problem List   Diagnosis Date Noted  . Osteomyelitis (HCC) 06/12/2018  . Essential hypertension 06/12/2018  . BPH (benign prostatic hyperplasia) 06/12/2018  . Lumbosacral radiculopathy at L3   . Tachycardia 12/14/2017  . Leukocytosis 12/14/2017  . Anemia due to blood loss 12/13/2017    Class: Chronic  . Syncope 12/03/2017  . Dehydration 12/03/2017  . Chronic back pain 12/03/2017  . Hypotension 12/03/2017  . Subclavian artery stenosis, right (HCC) 12/03/2017  . Status post lumbar laminectomy 11/12/2017  . Postoperative pain after spinal surgery 11/11/2017  . Weakness of right lower extremity   . Sciatica associated with disorder of lumbosacral spine   . Surgery, elective   . Diabetes mellitus type 2, uncomplicated (HCC) 10/14/2017    Class: Chronic  . Lumbar disc herniation with radiculopathy 10/13/2017    Class: Acute  . Radiculopathy, lumbar region 10/09/2017  . Pain in right hip 10/09/2017    Class: Acute  . Right knee sprain 10/09/2017    Class: Acute  . Hip pain, acute, right 10/09/2017  . Bradycardia  10/09/2017  . Spondylolisthesis, lumbar region 10/05/2017  . Knee contusion 07/24/2016  . Spinal stenosis in cervical region 05/14/2015    Class: Chronic  . Cubital tunnel syndrome on left 05/14/2015    Class: Chronic  . Stenosis of cervical spine with myelopathy (HCC) 05/14/2015  . Spinal stenosis, lumbar region, with neurogenic claudication 01/16/2014    Class: Chronic   Past Medical History:  Diagnosis Date  . Anginal pain (HCC)    occ  . Arthritis    "joints" (10/09/2017)  . Chronic lower back pain   . Complication of anesthesia    extremely claustrophobic; "have to be put down for MRI; don't sit in back seat of car;, etc."  . Coronary artery disease   . High cholesterol    "get shots twice/month" (10/09/2017)  . History of blood transfusion 2001   "related to CABG"  . History of kidney stones   . HOH (hard of hearing)    wearing hearing aids  . Hypertension   . Myocardial infarction (HCC) 2000s-06/2017 X 4  . Stroke [facility]) ~ 2015   "in my right eye; sight is coming back little by little" (10/09/2017)  . Type II diabetes mellitus (HCC)    Past Surgical History:  Procedure Laterality Date  . ANTERIOR CERVICAL DECOMP/DISCECTOMY FUSION N/A 05/14/2015   Procedure: C3-4  ANTERIOR CERVICAL DISCECTOMY AND FUSION WITH PLATE AND SCREWS, LOCAL  AND ALLOGRAFT BONE GRAFT;  Surgeon: Kerrin Champagne, MD;  Location: [facility] OR;  Service: Orthopedics;  Laterality: N/A;  . BACK SURGERY    . CARDIAC CATHETERIZATION     X 17 (10/09/2017)  . CATARACT EXTRACTION W/ INTRAOCULAR LENS  IMPLANT, BILATERAL Bilateral   . COLONOSCOPY    . CORONARY ANGIOPLASTY  06/2017  . CORONARY ANGIOPLASTY WITH STENT PLACEMENT     "6 stents total" (10/09/2017)  . CORONARY ARTERY BYPASS GRAFT  2001   CABG x4   . CYSTOSCOPY W/ STONE MANIPULATION    . JOINT REPLACEMENT    . LUMBAR LAMINECTOMY Right 10/13/2017   Procedure: MICRODISCECTOMY LUMBAR LAMINECTOMY RIGHT L3-4;  Surgeon: Kerrin Champagne, MD;  Location: [facility] OR;   Service: Orthopedics;  Laterality: Right;  . LUMBAR LAMINECTOMY Left 11/12/2017   Procedure: MICRODISCECTOMY L3-4 FOR RECURRENT HNP POSSIBLE FLURO;  Surgeon: Kerrin Champagne, MD;  Location: [facility] OR;  Service: Orthopedics;  Laterality: Left;  . LUMBAR LAMINECTOMY/DECOMPRESSION MICRODISCECTOMY N/A 01/16/2014   Procedure: Left L3-4 and L4-5 foraminotomies;  Surgeon: Kerrin Champagne, MD;  Location: [facility] OR;  Service: Orthopedics;  Laterality: N/A;  . NASAL FRACTURE SURGERY    . NASAL SINUS SURGERY     "several times since 1959"  . RADIOLOGY WITH ANESTHESIA N/A 10/12/2017   Procedure: MRI WITH ANESTHESIA;  Surgeon: Radiologist, Medication, MD;  Location: [facility] OR;  Service: Radiology;  Laterality: N/A;  . RADIOLOGY WITH ANESTHESIA N/A 12/05/2017   Procedure: MRI WITH ANESTHESIA;  Surgeon: Radiologist, Medication, MD;  Location: [facility] OR;  Service: Radiology;  Laterality: N/A;  . SHOULDER OPEN ROTATOR CUFF REPAIR Bilateral   . TOTAL KNEE ARTHROPLASTY Right 2012  . TYMPANOSTOMY TUBE PLACEMENT Bilateral   . ULNAR NERVE TRANSPOSITION Left 05/14/2015   Procedure: LEFT ULNAR NERVE DECOMPRESSION AT THE ELBOW;  Surgeon: Kerrin Champagne, MD;  Location: [facility] OR;  Service: Orthopedics;  Laterality: Left;    MEDICATIONS:   Current Facility-Administered Medications:  .  0.9 %  sodium chloride infusion, , Intravenous, Continuous, Arrien, York Ram, MD, Last Rate: 50 mL/hr at 06/12/18 2126 .  acetaminophen (TYLENOL) tablet 650 mg, 650 mg, Oral, Q6H, Arrien, York Ram, MD .  aspirin EC tablet 81 mg, 81 mg, Oral, Daily, Arrien, York Ram, MD, 81 mg at 06/13/18 0853 .  clopidogrel (PLAVIX) tablet 75 mg, 75 mg, Oral, Daily, Arrien, York Ram, MD, 75 mg at 06/13/18 0855 .  cyclobenzaprine (FLEXERIL) tablet 5 mg, 5 mg, Oral, TID PRN, Arrien, York Ram, MD .  enoxaparin (LOVENOX) injection 40 mg, 40 mg, Subcutaneous, Q24H, Arrien, York Ram, MD, 40 mg at 06/12/18 2114 .  finasteride (PROSCAR) tablet 5  mg, 5 mg, Oral, Daily, Arrien, York Ram, MD, 5 mg at 06/13/18 0858 .  HYDROmorphone (DILAUDID) injection 1 mg, 1 mg, Intravenous, Q2H PRN, Arrien, York Ram, MD, 1 mg at 06/13/18 0544 .  ibuprofen (ADVIL,MOTRIN) tablet 400 mg, 400 mg, Oral, TID, Arrien, York Ram, MD, 400 mg at 06/13/18 0853 .  insulin aspart (novoLOG) injection 0-9 Units, 0-9 Units, Subcutaneous, TID WC, Arrien, York Ram, MD .  metoprolol tartrate (LOPRESSOR) tablet 25 mg, 25 mg, Oral, BID, Arrien, York Ram, MD, 25 mg at 06/13/18 0853 .  ondansetron (ZOFRAN) tablet 4 mg, 4 mg, Oral, Q8H PRN, Arrien, York Ram, MD .  oxyCODONE (Oxy IR/ROXICODONE) immediate release tablet 5 mg, 5 mg, Oral, Q4H PRN, Arrien, York Ram, MD .  pantoprazole (PROTONIX) EC tablet 40 mg, 40 mg, Oral, Daily, Arrien, [employer]  Reuel Boom, MD, 40 mg at 06/13/18 0855 .  piperacillin-tazobactam (ZOSYN) IVPB 3.375 g, 3.375 g, Intravenous, Q8H, Wisniewski, Corinne J, [place] .  polyethylene glycol (MIRALAX / GLYCOLAX) packet 17 g, 17 g, Oral, Daily, Arrien, York Ram, MD .  simethicone [facility]) 40 MG/0.6ML suspension 40 mg, 40 mg, Oral, QID PRN, Arrien, York Ram, MD .  tamsulosin [facility]) capsule 0.4 mg, 0.4 mg, Oral, QPC supper, Arrien, York Ram, MD, 0.4 mg at 06/12/18 2113 .  vancomycin (VANCOCIN) 1,250 mg in sodium chloride 0.9 % 250 mL IVPB, 1,250 mg, Intravenous, Q12H, Wisniewski, Corinne J, Student-PharmD, Last Rate: 166.7 mL/hr at 06/13/18 0852, 1,250 mg at 06/13/18 0852 .  zolpidem (AMBIEN) tablet 5 mg, 5 mg, Oral, QHS PRN, Arrien, York Ram, MD, 5 mg at 06/12/18 2112  ALLERGIES:   Allergies  Allergen Reactions  . Ezetimibe Other (See Comments)    Cramps Cramps  . Statins Other (See Comments)    Cramps Cramps    REVIEW OF SYSTEMS: REVIEWED IN DETAIL IN CHART  FAMILY HISTORY:   Family History  Problem Relation Age of Onset  . CAD Brother   . Cancer Brother   .  Alzheimer's disease Mother   . Healthy Daughter   . Healthy Daughter   . Diabetes Neg Hx     SOCIAL HISTORY:   reports that he quit smoking about 61 years ago. He has a 2.00 pack-year smoking history. He has never used smokeless tobacco. He reports that he does not drink alcohol or use drugs.   EXAMINATION: Vital signs in last 24 hours: Temp:  [97.7 F (36.5 C)-98.6 F (37 C)] 97.8 F (36.6 C) (10/17 0748) Pulse Rate:  [66-84] 73 (10/17 0748) Resp:  [16-18] 18 (10/17 0748) BP: (111-148)/(61-85) 113/66 (10/17 0748) SpO2:  [94 %-100 %] 96 % (10/17 0748) Weight:  [93 kg] 93 kg (10/16 2041)    Musculoskeletal Exam  Well healed lumbar incision. Minimal pain to compression.SLR neg. Had stroke in Sept with some residual decreased sensation to left foot. Motors intact   DIAGNOSTIC STUDIES: Recent laboratory studies: Recent Labs    06/12/18 1308 06/13/18 0535  WBC 8.6 7.2  HGB 12.9* 12.1*  HCT 41.1 37.9*  PLT 324 308   Recent Labs    06/12/18 1308 06/13/18 0535  NA 137 137  K 3.8 4.3  CL 102 107  CO2 27 23  BUN 11 14  CREATININE 1.03 1.04  GLUCOSE 145* 119*  CALCIUM 9.5 8.7*   Lab Results  Component Value Date   INR 1.01 10/09/2017   INR 0.97 01/09/2014   INR 1.87 (H) 08/27/2010     Recent Radiographic Studies :  Ct Lumbar Spine Wo Contrast  Result Date: 05/14/2018 CLINICAL DATA:  Extreme low back pain and tenderness for 10 days after transferring to a CT scanner at another hospital. Lumbar fusion 12/11/2017. EXAM: CT LUMBAR SPINE WITHOUT CONTRAST TECHNIQUE: Multidetector CT imaging of the lumbar spine was performed without intravenous contrast administration. Multiplanar CT image reconstructions were also generated. COMPARISON:  Lumbar MRI 11/11/2017 and 12/05/2017. Office radiographs 04/24/2018 and 05/10/2018. FINDINGS: Segmentation: In keeping with the numbering applied to the previous studies, the last open disc space is assigned L5-S1. Of note, utilizing this  numbering, there are no visible ribs at T12. Alignment: Minimal convex right scoliosis. The lateral alignment is near anatomic. Vertebrae: Status post laminectomy and bilateral PLIF at L3 through L5. As demonstrated on the office radiographs, both L3 pedicle screws protrude through the superior endplate of  L3 into the L2-3 disc. There is lucency surrounding both L3 pedicle screws, especially on the left. The L4 and L5 pedicle screws appear well positioned and seated. The L3 compression fracture has progressed compared with the previous MRI and is asymmetric to the left where there is more than 50% loss of vertebral body height. In addition, there is irregular endplate lucency surrounding the L3-4 interbody spacer. There is diffuse sclerosis throughout the L3 and L4 vertebral bodies. The L4-5 interbody spacer demonstrates partial incorporation into the adjacent endplates, best seen on the coronal images. Paraspinal and other soft tissues: There is some soft tissue stranding in the paraspinal fat at the L3 and L4 levels which could be due to edema, inflammation or hemorrhage. No focal fluid collection identified on noncontrast imaging. There is extensive aortic and branch vessel atherosclerosis. Disc levels: L1-2: Stable mild loss of disc height with annular disc bulging. No significant spinal stenosis. L2-3: Grossly stable annular disc bulging asymmetric to the right with facet and ligamentous hypertrophy. Mild spinal stenosis and mild narrowing of the right lateral recess and right foramen. L3-4: Limited soft tissue evaluation due to technique. As above, loosening of the bilateral L3 pedicle screws which protrude through the superior endplate of L3 into the L2-3 disc. Progressive loss of vertebral body height at L3 with irregular endplate lucencies which could indicate nonunion or osteomyelitis. L4-5: Status post interbody fusion without hardware failure or loosening. No significant foraminal compromise. L5-S1:  Transitional anatomy with bilateral lumbosacral assimilation joints. No spinal stenosis or nerve root encroachment. IMPRESSION: 1. As demonstrated on office radiographs, there is progressive loss of vertebral body height at L3 with loosening of the bilateral L3 pedicle screws and migration through the superior endplate of L3 into the L2-3 disc. There is no solid interbody fusion at L3-4. 2. There are irregular endplate lucency surrounding the L3-4 interbody spacers with surrounding sclerosis. Findings could be secondary to nonunion and abnormal motion, although osteomyelitis should be considered. Patient did not have leukocytosis on recent blood work. 3. No new fracture or significant change in underlying spondylosis. 4. These results will be called to the ordering clinician or representative by the Radiologist Assistant, and communication documented in the PACS or zVision Dashboard. Electronically Signed   By: Carey Bullocks M.D.   On: 05/14/2018 16:07   Mr Lumbar Spine Wo Contrast  Result Date: 06/12/2018 CLINICAL DATA:  83 year old male with increased lumbar spine pain status post fusion 6 months ago. Fractures/collapse of the L3 vertebral body. EXAM: MRI LUMBAR SPINE WITHOUT CONTRAST TECHNIQUE: Multiplanar, multisequence MR imaging of the lumbar spine was performed. No intravenous contrast was administered. COMPARISON:  CT lumbar Spine [facility] 05/14/2018. Lumbar MRI 12/05/2017, and earlier. FINDINGS: Segmentation: Hypoplastic ribs assumed T12 as on the CT and April MRI numbering. Alignment: Straightening of lumbar lordosis is stable from last month. Vertebrae: Confluent marrow edema from the posterior inferior L2 body, throughout the L3 body, and into the superior and posterior L4 body. See series 7, image 9. This has progressed from the L3-L4 level endplate edema seen in April, with moderate collapse of L3 and erosion of the L3-L4 endplates since the prior MRI. Interbody implant at  L3-L4. New abnormal increased T2 and STIR signal in the L2-L3 disc, with new L2 endplate and body marrow edema since April. The L1 and L5 levels appear stable and intact. Mild chronic T12 inferior endplate compression is stable. Intact visible lower thoracic levels. Intact visible sacrum and SI joints. Conus  medullaris and cauda equina: Conus extends to the T12-L1 level. No lower spinal cord or conus signal abnormality. Paraspinal and other soft tissues: Confluent left greater than right medial psoas muscle edema as seen on series 6, image 13 and series 5, images 17 (on the left) and 1 (on the right). No intramuscular fluid collection is identified. The STIR images demonstrate prominent marrow edema in the L3 transverse processes. Superimposed postoperative changes to the lower lumbar erector spinae muscles. Negative visible abdominal viscera. Disc levels: No Lower spinal stenosis above L2. L2-L3: New abnormal increased T2 and STIR signal within the disc. Progressed circumferential disc bulge with chronic mild to moderate facet and ligament flavum hypertrophy. New mild to moderate spinal stenosis. Chronic mild to moderate L2 foraminal stenosis greater on the right. L3-L4: Posterior and interbody fusion hardware here and prior decompression. No spinal stenosis. There appears to be soft tissue edema affecting the bilateral L3 neural foramina. L4-L5: Posterior and interbody fusion hardware plus decompression here. Mild residual right L4 foraminal stenosis related to facet hypertrophy. L5-S1: Stable and negative aside from mild facet hypertrophy. IMPRESSION: 1. Constellation of imaging findings since the 12/05/2017 MRI is most compatible with prolonged Discitis Osteomyelitis at L3-L4, and extension of Discitis Osteomyelitis to L2-L3 since April. There is bilateral L3 through L5 spinal fusion hardware in place. Associated partial collapse of L3 with L3 and L4 endplate erosion. Abnormal L2-L3 disc and L2 inferior  endplates now. 2. Bilateral psoas muscle edema/inflammation. No intramuscular or epidural abscess identified. 3. Multifactorial mild to moderate spinal stenosis at L2-L3 is new since April. Electronically Signed   By: Odessa Fleming M.D.   On: 06/12/2018 16:14   Nm Bone Scan 3 Phase  Result Date: 06/06/2018 CLINICAL DATA:  SI joint pain, question spondyloarthropathy, painful loosening of hardware L3, LEFT sacral pain, prior L3-L5 fusion in April 2019, assess for sacral insufficiency fracture EXAM: NUCLEAR MEDICINE 3-PHASE BONE SCAN TECHNIQUE: Radionuclide angiographic images, immediate static blood pool images, and 3-hour delayed static images were obtained of the lumbar spine, pelvis and hips after intravenous injection of radiopharmaceutical. RADIOPHARMACEUTICALS:  22 mCi Tc-54m MDP IV COMPARISON:  None Correlation: Lumbar spine radiographs 05/29/2018, pelvic radiograph 01/17/2018 FINDINGS: Vascular phase: Normal blood flow to the hips and pelvis Blood pool phase: Normal blood pool at the pelvis and hips. Mildly increased blood pool in the lumbar region at approximately the L3-L5 Delayed phase: Delayed images show normal tracer accumulation at the hip joints and SI joints. Normal tracer localization in the pelvis and proximal femora. No scintigraphic evidence of sacral insufficiency fracture. Diffusely increased tracer localization is seen in lumbar spine at L3-L5, predominantly at L3-L4, corresponding to the surgical levels identified on recent radiographs. These findings are consistent with the relative recent age of surgery and expected postoperative uptake. IMPRESSION: Normal scintigraphic appearance of the pelvis, SI joints, sacrum, and hip joints. Increased blood pool and delayed uptake of tracer in lower lumbar spine corresponding to prior L3-L5 fusion, with greatest uptake centered at L3-L4. Degree of uptake is consistent with preceding spine surgery. Due to postsurgical uptake, unable to exclude underlying  infection. Electronically Signed   By: [name] M.D.   On: 06/06/2018 13:44   Ct Abdomen Pelvis W Contrast  Result Date: 06/12/2018 CLINICAL DATA:  Left lower quadrant pain with diarrhea. EXAM: CT ABDOMEN AND PELVIS WITH CONTRAST TECHNIQUE: Multidetector CT imaging of the abdomen and pelvis was performed using the standard protocol following bolus administration of intravenous contrast. CONTRAST:  100 cc Omnipaque  300 intravenous COMPARISON:  MRI of the lumbar spine from the same day FINDINGS: Lower chest:  Coronary calcification.  No acute finding Hepatobiliary: No focal liver abnormality.Single stone within the gallbladder which is full but shows no inflammatory changes. Mild prominence of the biliary tree without calcified choledocholithiasis Pancreas: Unremarkable. Spleen: Unremarkable. 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 No hydronephrosis or stone. Subcentimeter low-densities in the left kidney, too small for densitometry. Unremarkable bladder. Stomach/Bowel:  No obstruction. No appendicitis. Vascular/Lymphatic: No acute vascular abnormality. Extensive atherosclerotic calcification of the aorta. No mass or adenopathy. Reproductive:Probable central prostate nodule.  No acute finding. Other: No ascites or pneumoperitoneum. Musculoskeletal: L2-3 and L3-4 discitis osteomyelitis. There has been L3-4 PLIF. The surrounding paravertebral fat is edematous. The pedicle screws at L3 and L4 show halo in/loosening. No evident collection. There is subsequent MRI available. IMPRESSION: 1. L2-3 and L3-4 discitis osteomyelitis which involves L3-4 PLIF hardware. 2. No evidence of colitis or other inflammation to explain diarrhea. 3. Cholelithiasis. Electronically Signed   By: Marnee Spring M.D.   On: 06/12/2018 18:13    ASSESSMENT: osteomyelitis lumbar spine. Mr Coury is presently stable and comfortable   PLAN: Dr Otelia Sergeant aware of hospitalization. Will consider exploration at some point  in the future when medically stable and IV antibiotic regimen completed. Valeria Batman 06/13/2018, 9:05 AM Patient ID: Leafy Half, male   DOB: Feb 07, 1935, 83 y.o.   MRN: [id number]

## 2018-06-13 NOTE — Care Management Note (Signed)
Case Management Note  Patient Details  Name: Jeffrey Nelson MRN: [id number] Date of Birth: [birth year]  Subjective/Objective:                    Action/Plan:  WIll continue to follow for discharge needs.   L2-L3, L3-L4 discitis/osteomyelitis involving PLIF hardware.  IV ABX, poss surgery after completion of ABX ( following CX) and medically stable   Expected Discharge Date:                  Expected Discharge Plan:  Home w Home Health Services  In-House Referral:     Discharge planning Services  CM Consult  Post Acute Care Choice:    Choice offered to:     DME Arranged:    DME Agency:     HH Arranged:    HH Agency:     Status of Service:  In process, will continue to follow  If discussed at Long Length of Stay Meetings, dates discussed:    Additional Comments:  Kingsley Plan, RN 06/13/2018, 10:21 AM

## 2018-06-13 NOTE — Progress Notes (Signed)
IR aware of request for disc aspiration.  Patient with 3 lumbar surgeries since January, now with back pain and suspected osteomyelitis.  ID following.  He is on Plavix since his CABG in 2017.  Will review imaging with IR MD to determine whether we can safely proceed with aspiration.  Have made patient NPO after midnight and held lovenox.   Loyce Dys, MS RD PA-C 4:09 PM

## 2018-06-13 NOTE — Progress Notes (Signed)
[facility] PROGRESS NOTE  Jeffrey Nelson GNF:[id number] DOB: 1935/06/24 DOA: 06/12/2018  PCP: Deloris Ping, MD  Brief History/Interval Summary: 83 y.o. 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    Patient presented to his orthopedic surgeon with complains of worsening back pain over the last 2 weeks.  He underwent lumbar spine surgery with fusion earlier this year.  Patient was sent to the emergency department by the orthopedic surgeon.  MRI was done which raise concern for osteomyelitis at L3-L4 and L2-L3.  Patient was hospitalized for further management.   Reason for Visit: Lumbar discitis  Consultants: Orthopedics  Procedures: None yet  Antibiotics: Vancomycin and Zosyn  Subjective/Interval History: Patient states that his back pain is 3 out of 10 in intensity.  He was also experiencing some heaviness in his legs which is improved.  ROS: Denies any chest pain or shortness of breath.  Objective:  Vital Signs  Vitals:   06/12/18 1945 06/12/18 2041 06/13/18 0514 06/13/18 0748  BP: 134/75 (!) 148/85 136/70 113/66  Pulse: 76 79 72 73  Resp:  18 18 18   Temp:  98.6 F (37 C) 98.2 F (36.8 C) 97.8 F (36.6 C)  TempSrc:   Oral Oral  SpO2: 97% 100% 97% 96%  Weight:  93 kg      Intake/Output Summary (Last 24 hours) at 06/13/2018 1042 Last data filed at 06/13/2018 8469 Gross per 24 hour  Intake 687.9 ml  Output 145 ml  Net 542.9 ml   Filed Weights   06/12/18 2041  Weight: 93 kg    General appearance: alert, cooperative, appears stated age and no distress Resp: Normal effort at rest.  Clear to auscultation bilaterally otherwise. Cardio: regular rate and rhythm, S1, S2 normal, no murmur, click, rub or gallop GI: soft,  non-tender; bowel sounds normal; no masses,  no organomegaly Extremities: extremities normal, atraumatic, no cyanosis or edema Neurologic: Alert and oriented x3.  Good strength bilateral lower extremities.  No focal deficits noted.  Lab Results:  Data Reviewed: I have personally reviewed following labs and imaging studies  CBC: Recent Labs  Lab 06/12/18 1308 06/13/18 0535  WBC 8.6 7.2  NEUTROABS 6.5  --   HGB 12.9* 12.1*  HCT 41.1 37.9*  MCV 94.7 95.7  PLT 324 308    Basic Metabolic Panel: Recent Labs  Lab 06/12/18 1308 06/13/18 0535  NA 137 137  K 3.8 4.3  CL 102 107  CO2 27 23  GLUCOSE 145* 119*  BUN 11 14  CREATININE 1.03 1.04  CALCIUM 9.5 8.7*    GFR: Estimated Creatinine Clearance: 64.8 mL/min (by C-G formula based on SCr of 1.04 mg/dL).  Liver Function Tests: Recent Labs  Lab 06/12/18 1308  AST 20  ALT 22  ALKPHOS 171*  BILITOT 1.5*  PROT 7.3  ALBUMIN 3.4*    Recent Labs  Lab 06/12/18 1308  LIPASE 22   CBG: Recent Labs  Lab 06/12/18 2045 06/13/18 0747  GLUCAP 162* 108*     Recent Results (from the past 240 hour(s))  Blood culture (routine x 2)     Status: None (Preliminary result)   Collection Time: 06/12/18  1:28 PM  Result Value Ref Range Status   Specimen Description BLOOD LEFT ANTECUBITAL  Final   Special Requests   Final  BOTTLES DRAWN AEROBIC AND ANAEROBIC Blood Culture adequate volume   Culture   Final    NO GROWTH < 24 HOURS Performed at [facility], [address], [place], [place] [address]    Report Status PENDING  Incomplete  Blood culture (routine x 2)     Status: None (Preliminary result)   Collection Time: 06/12/18  1:29 PM  Result Value Ref Range Status   Specimen Description BLOOD RIGHT ANTECUBITAL  Final   Special Requests   Final    BOTTLES DRAWN AEROBIC AND ANAEROBIC Blood Culture adequate volume   Culture   Final    NO GROWTH < 24 HOURS Performed at [facility], [address]  [place], [place] [address]    Report Status PENDING  Incomplete      Radiology Studies: Mr Lumbar Spine Wo Contrast  Result Date: 06/12/2018 CLINICAL DATA:  83 year old male with increased lumbar spine pain status post fusion 6 months ago. Fractures/collapse of the L3 vertebral body. EXAM: MRI LUMBAR SPINE WITHOUT CONTRAST TECHNIQUE: Multiplanar, multisequence MR imaging of the lumbar spine was performed. No intravenous contrast was administered. COMPARISON:  CT lumbar Spine [facility] 05/14/2018. Lumbar MRI 12/05/2017, and earlier. FINDINGS: Segmentation: Hypoplastic ribs assumed T12 as on the CT and April MRI numbering. Alignment: Straightening of lumbar lordosis is stable from last month. Vertebrae: Confluent marrow edema from the posterior inferior L2 body, throughout the L3 body, and into the superior and posterior L4 body. See series 7, image 9. This has progressed from the L3-L4 level endplate edema seen in April, with moderate collapse of L3 and erosion of the L3-L4 endplates since the prior MRI. Interbody implant at L3-L4. New abnormal increased T2 and STIR signal in the L2-L3 disc, with new L2 endplate and body marrow edema since April. The L1 and L5 levels appear stable and intact. Mild chronic T12 inferior endplate compression is stable. Intact visible lower thoracic levels. Intact visible sacrum and SI joints. Conus medullaris and cauda equina: Conus extends to the T12-L1 level. No lower spinal cord or conus signal abnormality. Paraspinal and other soft tissues: Confluent left greater than right medial psoas muscle edema as seen on series 6, image 13 and series 5, images 17 (on the left) and 1 (on the right). No intramuscular fluid collection is identified. The STIR images demonstrate prominent marrow edema in the L3 transverse processes. Superimposed postoperative changes to the lower lumbar erector spinae muscles. Negative visible abdominal viscera. Disc levels: No Lower  spinal stenosis above L2. L2-L3: New abnormal increased T2 and STIR signal within the disc. Progressed circumferential disc bulge with chronic mild to moderate facet and ligament flavum hypertrophy. New mild to moderate spinal stenosis. Chronic mild to moderate L2 foraminal stenosis greater on the right. L3-L4: Posterior and interbody fusion hardware here and prior decompression. No spinal stenosis. There appears to be soft tissue edema affecting the bilateral L3 neural foramina. L4-L5: Posterior and interbody fusion hardware plus decompression here. Mild residual right L4 foraminal stenosis related to facet hypertrophy. L5-S1: Stable and negative aside from mild facet hypertrophy. IMPRESSION: 1. Constellation of imaging findings since the 12/05/2017 MRI is most compatible with prolonged Discitis Osteomyelitis at L3-L4, and extension of Discitis Osteomyelitis to L2-L3 since April. There is bilateral L3 through L5 spinal fusion hardware in place. Associated partial collapse of L3 with L3 and L4 endplate erosion. Abnormal L2-L3 disc and L2 inferior endplates now. 2. Bilateral psoas muscle edema/inflammation. No intramuscular or epidural  abscess identified. 3. Multifactorial mild to moderate spinal stenosis at L2-L3 is new since April. Electronically Signed   By: Odessa Fleming M.D.   On: 06/12/2018 16:14   Ct Abdomen Pelvis W Contrast  Result Date: 06/12/2018 CLINICAL DATA:  Left lower quadrant pain with diarrhea. EXAM: CT ABDOMEN AND PELVIS WITH CONTRAST TECHNIQUE: Multidetector CT imaging of the abdomen and pelvis was performed using the standard protocol following bolus administration of intravenous contrast. CONTRAST:  100 cc Omnipaque 300 intravenous COMPARISON:  MRI of the lumbar spine from the same day FINDINGS: Lower chest:  Coronary calcification.  No acute finding Hepatobiliary: No focal liver abnormality.Single stone within the gallbladder which is full but shows no inflammatory changes. Mild prominence of  the biliary tree without calcified choledocholithiasis Pancreas: Unremarkable. Spleen: Unremarkable. 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 No hydronephrosis or stone. Subcentimeter low-densities in the left kidney, too small for densitometry. Unremarkable bladder. Stomach/Bowel:  No obstruction. No appendicitis. Vascular/Lymphatic: No acute vascular abnormality. Extensive atherosclerotic calcification of the aorta. No mass or adenopathy. Reproductive:Probable central prostate nodule.  No acute finding. Other: No ascites or pneumoperitoneum. Musculoskeletal: L2-3 and L3-4 discitis osteomyelitis. There has been L3-4 PLIF. The surrounding paravertebral fat is edematous. The pedicle screws at L3 and L4 show halo in/loosening. No evident collection. There is subsequent MRI available. IMPRESSION: 1. L2-3 and L3-4 discitis osteomyelitis which involves L3-4 PLIF hardware. 2. No evidence of colitis or other inflammation to explain diarrhea. 3. Cholelithiasis. Electronically Signed   By: Marnee Spring M.D.   On: 06/12/2018 18:13     Medications:  Scheduled: . acetaminophen  650 mg Oral Q6H  . aspirin EC  81 mg Oral Daily  . clopidogrel  75 mg Oral Daily  . enoxaparin (LOVENOX) injection  40 mg Subcutaneous Q24H  . finasteride  5 mg Oral Daily  . ibuprofen  400 mg Oral TID  . insulin aspart  0-9 Units Subcutaneous TID WC  . metoprolol tartrate  25 mg Oral BID  . pantoprazole  40 mg Oral Daily  . polyethylene glycol  17 g Oral Daily  . tamsulosin  0.4 mg Oral QPC supper   Continuous: . sodium chloride 50 mL/hr at 06/12/18 2126  . piperacillin-tazobactam (ZOSYN)  IV    . vancomycin 1,250 mg (06/13/18 0852)   ZOX:[id number], HYDROmorphone (DILAUDID) injection, ondansetron, oxyCODONE, simethicone, zolpidem  Assessment/Plan:    L2-L3, L3-4 discitis/osteomyelitis involving hardware Patient was admitted to the hospital and placed on vancomycin and Zosyn.  Follow-up on  blood cultures.  Seen by orthopedic service.  Plan is for surgical intervention after patient has received a course of IV antibiotics.  We will consult infectious disease to assist with management.  May need to request IR to drain to get culture data.  Continue pain medications.  Bowel regimen.  Discussed with Dr. Cleophas Dunker today.  Coronary artery disease status post CABG and angioplasty Currently stable.  Continue with dual antiplatelet treatment with aspirin and Plavix.  Continue beta-blockers.  Last cardiac catheterization was done in July 2018.  Last stent placement was in November 2017.  Should be okay to hold his Plavix as orthopedics is considering surgical intervention in the near future.  Diabetes mellitus type 2 Apparently diet controlled at home.  Continue to monitor CBGs.  History of BPH Continue tamsulosin and finasteride.  DVT Prophylaxis: Lovenox    Code Status: Full code Family Communication: Discussed with the patient.  No family at bedside Disposition Plan: Management as outlined above.  We will consult ID.    LOS: 1 day   Osvaldo Shipper  [facility] Pager [phone] 06/13/2018, 10:42 AM  If 7PM-7AM, please contact night-coverage at [email or web address], password [facility]

## 2018-06-14 ENCOUNTER — Encounter (HOSPITAL_COMMUNITY): Payer: Self-pay | Admitting: Interventional Radiology

## 2018-06-14 ENCOUNTER — Inpatient Hospital Stay (HOSPITAL_COMMUNITY): Payer: Medicare HMO

## 2018-06-14 ENCOUNTER — Inpatient Hospital Stay: Payer: Self-pay

## 2018-06-14 HISTORY — PX: IR LUMBAR DISC ASPIRATION W/IMG GUIDE: IMG5306

## 2018-06-14 LAB — BASIC METABOLIC PANEL WITH GFR
Anion gap: 11 (ref 5–15)
BUN: 11 mg/dL (ref 8–23)
CO2: 22 mmol/L (ref 22–32)
Calcium: 8.9 mg/dL (ref 8.9–10.3)
Chloride: 104 mmol/L (ref 98–111)
Creatinine, Ser: 0.86 mg/dL (ref 0.61–1.24)
GFR calc Af Amer: >60 mL/min
GFR calc non Af Amer: >60 mL/min
Glucose, Bld: 124 mg/dL — ABNORMAL HIGH (ref 70–99)
Potassium: 3.9 mmol/L (ref 3.5–5.1)
Sodium: 137 mmol/L (ref 135–145)

## 2018-06-14 LAB — CBC
HCT: 35.1 % — ABNORMAL LOW (ref 39.0–52.0)
Hemoglobin: 11.3 g/dL — ABNORMAL LOW (ref 13.0–17.0)
MCH: 31 pg (ref 26.0–34.0)
MCHC: 32.2 g/dL (ref 30.0–36.0)
MCV: 96.2 fL (ref 80.0–100.0)
Platelets: 278 10*3/uL (ref 150–400)
RBC: 3.65 MIL/uL — ABNORMAL LOW (ref 4.22–5.81)
RDW: 13.7 % (ref 11.5–15.5)
WBC: 8.2 10*3/uL (ref 4.0–10.5)
nRBC: 0 % (ref 0.0–0.2)

## 2018-06-14 LAB — GLUCOSE, CAPILLARY
GLUCOSE-CAPILLARY: 126 mg/dL — AB (ref 70–99)
Glucose-Capillary: 130 mg/dL — ABNORMAL HIGH (ref 70–99)
Glucose-Capillary: 114 mg/dL — ABNORMAL HIGH (ref 70–99)
Glucose-Capillary: 156 mg/dL — ABNORMAL HIGH (ref 70–99)

## 2018-06-14 LAB — PROTIME-INR
INR: 1.05
Prothrombin Time: 13.6 s (ref 11.4–15.2)

## 2018-06-14 MED ORDER — MIDAZOLAM HCL 2 MG/2ML IJ SOLN
INTRAMUSCULAR | Status: AC | PRN
  Administered 2018-06-14 (×2): 1 mg via INTRAVENOUS

## 2018-06-14 MED ORDER — FENTANYL CITRATE (PF) 100 MCG/2ML IJ SOLN
INTRAMUSCULAR | Status: AC | PRN
  Administered 2018-06-14 (×2): 50 ug via INTRAVENOUS

## 2018-06-14 MED ORDER — SODIUM CHLORIDE 0.9% FLUSH
10.0000 mL | INTRAVENOUS | Status: DC | PRN
  Administered 2018-06-18 – 2018-06-19 (×2): 10 mL
  Filled 2018-06-14 (×2): qty 40

## 2018-06-14 MED ORDER — LIDOCAINE HCL 1 % IJ SOLN
INTRAMUSCULAR | Status: AC | PRN
  Administered 2018-06-14: 5 mL

## 2018-06-14 MED ORDER — LIDOCAINE HCL 1 % IJ SOLN
INTRAMUSCULAR | Status: AC
  Filled 2018-06-14: qty 20

## 2018-06-14 MED ORDER — MIDAZOLAM HCL 2 MG/2ML IJ SOLN
INTRAMUSCULAR | Status: AC
  Filled 2018-06-14: qty 4

## 2018-06-14 MED ORDER — ZOLPIDEM TARTRATE 5 MG PO TABS
5.0000 mg | ORAL_TABLET | Freq: Every evening | ORAL | Status: DC | PRN
  Administered 2018-06-14: 5 mg via ORAL
  Filled 2018-06-14: qty 1

## 2018-06-14 MED ORDER — VANCOMYCIN HCL IN DEXTROSE 1-5 GM/200ML-% IV SOLN
1000.0000 mg | Freq: Two times a day (BID) | INTRAVENOUS | Status: DC
  Administered 2018-06-14 – 2018-06-17 (×6): 1000 mg via INTRAVENOUS
  Filled 2018-06-14 (×8): qty 200

## 2018-06-14 MED ORDER — FENTANYL CITRATE (PF) 100 MCG/2ML IJ SOLN
INTRAMUSCULAR | Status: AC
  Filled 2018-06-14: qty 4

## 2018-06-14 MED ORDER — SODIUM CHLORIDE 0.9 % IV SOLN
2.0000 g | INTRAVENOUS | Status: DC
  Administered 2018-06-14 – 2018-06-26 (×12): 2 g via INTRAVENOUS
  Filled 2018-06-14 (×13): qty 20

## 2018-06-14 MED ORDER — BISACODYL 5 MG PO TBEC
10.0000 mg | DELAYED_RELEASE_TABLET | Freq: Once | ORAL | Status: AC
  Administered 2018-06-14: 10 mg via ORAL
  Filled 2018-06-14: qty 2

## 2018-06-14 NOTE — Progress Notes (Signed)
Pharmacy Antibiotic Note  Jeffrey Nelson is a 83 y.o. male admitted on 06/12/2018 with osteomyelitis.  Pharmacy has been consulted for vancomycin dosing. MRI reveals discitis osteomyelitis at L3-L4 and extension of discitis osteomyelitis L2-L3. Pt recently diagnosed w/ hardware loosening and compression fracture of L3. Patient was on vancomycin and zosyn, but these were held on 10/17 pending aspirate. Ok to resume vancomycin and ceftriaxone per ID.  Plan:  Vancomycin 1,000 mg IV q12h Ceftriaxone 2 g IV daily Monitor and adjust abx per renal fx, C&S, vanc trough as needed  Weight: 205 lb 0.4 oz (93 kg)  Temp (24hrs), Avg:98.4 F (36.9 C), Min:98 F (36.7 C), Max:98.7 F (37.1 C)  Recent Labs  Lab 06/12/18 1308 06/13/18 0535 06/14/18 0547  WBC 8.6 7.2 8.2  CREATININE 1.03 1.04 0.86    Estimated Creatinine Clearance: 78.3 mL/min (by C-G formula based on SCr of 0.86 mg/dL).    Allergies  Allergen Reactions  . Ezetimibe Other (See Comments)    Cramps Cramps  . Statins Other (See Comments)    Cramps Cramps    Antimicrobials this admission: Vancomycin 10/16>>10/17; 10/18>> Zosyn 10/16>>10/17 Ceftriaxone 10/18>>  Dose adjustments this admission: N/A  Microbiology results: 10/16 bcx: ngtd 10/18 deep wound culture: pending  Thank you for allowing pharmacy to be a part of this patient's care.  Danae Orleans, PharmD PGY1 Pharmacy Resident Phone [phone] 06/14/2018       1:07 PM

## 2018-06-14 NOTE — Progress Notes (Signed)
Patient was up all night despite taking Ambien. He was extremely anxious about a potential lumbar aspiration today. Despite explaining to the patient many times that IR is consulted to determine if the patient is stable to have the procedure. The patient called his wife around 4am to come to the hospital because he couldn't sleep. After calling his wife, he called the [place] police department to escort his wife to the hospital. I spoke with the police department to ensure then that the patient was safely in the hospital on [facility] campus. I then called the patient's wife make sure she was safe and to assure her that the patient was safe and we were attending closely to his needs. Patient continue to ring the nurses station constantly throughout the night. He remained a/o x4.

## 2018-06-14 NOTE — Progress Notes (Addendum)
[facility]    Date of Admission:  06/12/2018   Total days of antibiotics 2   ID: Jeffrey Nelson is a 83 y.o. male with discitis  Active Problems:   Spinal stenosis, lumbar region, with neurogenic claudication   Stenosis of cervical spine with myelopathy (HCC)   Diabetes mellitus type 2, uncomplicated (HCC)   Osteomyelitis (HCC)   Essential hypertension   BPH (benign prostatic hyperplasia)    Subjective: Having back pain from procedure. Slept poorly, called police to bring wife to hospital  Underwent IR aspiration  Medications:  . acetaminophen  650 mg Oral Q6H  . aspirin EC  81 mg Oral Daily  . enoxaparin (LOVENOX) injection  40 mg Subcutaneous Q24H  . feeding supplement (ENSURE ENLIVE)  237 mL Oral BID BM  . fentaNYL      . finasteride  5 mg Oral Daily  . insulin aspart  0-9 Units Subcutaneous TID WC  . lidocaine      . metoprolol tartrate  25 mg Oral BID  . midazolam      . pantoprazole  40 mg Oral Daily  . polyethylene glycol  17 g Oral Daily  . tamsulosin  0.4 mg Oral QPC supper    Objective: Vital signs in last 24 hours: Temp:  [98 F (36.7 C)-98.7 F (37.1 C)] 98.2 F (36.8 C) (10/18 1048) Pulse Rate:  [55-78] 55 (10/18 1048) Resp:  [14-18] 16 (10/18 1048) BP: (121-168)/(58-88) 121/69 (10/18 1048) SpO2:  [96 %-100 %] 97 % (10/18 1048) Weight:  [93 kg] 93 kg (10/17 2029) Physical Exam  Constitutional: He is oriented to person, place, and time. He appears well-developed and well-nourished. No distress.  HENT:  Mouth/Throat: Oropharynx is clear and moist. No oropharyngeal exudate.  Cardiovascular: Normal rate, regular rhythm and normal heart sounds. Exam reveals no gallop and no friction rub.  No murmur heard.  Pulmonary/Chest: Effort normal and breath sounds normal. No respiratory distress. He has no wheezes.  Abdominal: Soft. Bowel sounds are normal. He exhibits no distension. There is no tenderness.  Lymphadenopathy:  He  has no cervical adenopathy.  Neurological: He is alert and oriented to person, place, and time.  Skin: Skin is warm and dry. No rash noted. No erythema.  Psychiatric: He has a normal mood and affect. His behavior is normal.    Lab Results Recent Labs    06/13/18 0535 06/14/18 0547  WBC 7.2 8.2  HGB 12.1* 11.3*  HCT 37.9* 35.1*  NA 137 137  K 4.3 3.9  CL 107 104  CO2 23 22  BUN 14 11  CREATININE 1.04 0.86   Liver Panel Recent Labs    06/12/18 1308  PROT 7.3  ALBUMIN 3.4*  AST 20  ALT 22  ALKPHOS 171*  BILITOT 1.5*   Sedimentation Rate Recent Labs    06/12/18 1308  ESRSEDRATE 44*   C-Reactive Protein Recent Labs    06/12/18 1308  CRP 1.6*    Microbiology:  Studies/Results: Mr Lumbar Spine Wo Contrast  Result Date: 06/12/2018 CLINICAL DATA:  83 year old male with increased lumbar spine pain status post fusion 6 months ago. Fractures/collapse of the L3 vertebral body. EXAM: MRI LUMBAR SPINE WITHOUT CONTRAST TECHNIQUE: Multiplanar, multisequence MR imaging of the lumbar spine was performed. No intravenous contrast was administered. COMPARISON:  CT lumbar Spine [facility] 05/14/2018. Lumbar MRI 12/05/2017, and earlier. FINDINGS: Segmentation: Hypoplastic ribs assumed T12 as on the CT and April MRI numbering.  Alignment: Straightening of lumbar lordosis is stable from last month. Vertebrae: Confluent marrow edema from the posterior inferior L2 body, throughout the L3 body, and into the superior and posterior L4 body. See series 7, image 9. This has progressed from the L3-L4 level endplate edema seen in April, with moderate collapse of L3 and erosion of the L3-L4 endplates since the prior MRI. Interbody implant at L3-L4. New abnormal increased T2 and STIR signal in the L2-L3 disc, with new L2 endplate and body marrow edema since April. The L1 and L5 levels appear stable and intact. Mild chronic T12 inferior endplate compression is stable. Intact visible  lower thoracic levels. Intact visible sacrum and SI joints. Conus medullaris and cauda equina: Conus extends to the T12-L1 level. No lower spinal cord or conus signal abnormality. Paraspinal and other soft tissues: Confluent left greater than right medial psoas muscle edema as seen on series 6, image 13 and series 5, images 17 (on the left) and 1 (on the right). No intramuscular fluid collection is identified. The STIR images demonstrate prominent marrow edema in the L3 transverse processes. Superimposed postoperative changes to the lower lumbar erector spinae muscles. Negative visible abdominal viscera. Disc levels: No Lower spinal stenosis above L2. L2-L3: New abnormal increased T2 and STIR signal within the disc. Progressed circumferential disc bulge with chronic mild to moderate facet and ligament flavum hypertrophy. New mild to moderate spinal stenosis. Chronic mild to moderate L2 foraminal stenosis greater on the right. L3-L4: Posterior and interbody fusion hardware here and prior decompression. No spinal stenosis. There appears to be soft tissue edema affecting the bilateral L3 neural foramina. L4-L5: Posterior and interbody fusion hardware plus decompression here. Mild residual right L4 foraminal stenosis related to facet hypertrophy. L5-S1: Stable and negative aside from mild facet hypertrophy. IMPRESSION: 1. Constellation of imaging findings since the 12/05/2017 MRI is most compatible with prolonged Discitis Osteomyelitis at L3-L4, and extension of Discitis Osteomyelitis to L2-L3 since April. There is bilateral L3 through L5 spinal fusion hardware in place. Associated partial collapse of L3 with L3 and L4 endplate erosion. Abnormal L2-L3 disc and L2 inferior endplates now. 2. Bilateral psoas muscle edema/inflammation. No intramuscular or epidural abscess identified. 3. Multifactorial mild to moderate spinal stenosis at L2-L3 is new since April. Electronically Signed   By: Odessa Fleming M.D.   On: 06/12/2018  16:14   Ct Abdomen Pelvis W Contrast  Result Date: 06/12/2018 CLINICAL DATA:  Left lower quadrant pain with diarrhea. EXAM: CT ABDOMEN AND PELVIS WITH CONTRAST TECHNIQUE: Multidetector CT imaging of the abdomen and pelvis was performed using the standard protocol following bolus administration of intravenous contrast. CONTRAST:  100 cc Omnipaque 300 intravenous COMPARISON:  MRI of the lumbar spine from the same day FINDINGS: Lower chest:  Coronary calcification.  No acute finding Hepatobiliary: No focal liver abnormality.Single stone within the gallbladder which is full but shows no inflammatory changes. Mild prominence of the biliary tree without calcified choledocholithiasis Pancreas: Unremarkable. Spleen: Unremarkable. 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 No hydronephrosis or stone. Subcentimeter low-densities in the left kidney, too small for densitometry. Unremarkable bladder. Stomach/Bowel:  No obstruction. No appendicitis. Vascular/Lymphatic: No acute vascular abnormality. Extensive atherosclerotic calcification of the aorta. No mass or adenopathy. Reproductive:Probable central prostate nodule.  No acute finding. Other: No ascites or pneumoperitoneum. Musculoskeletal: L2-3 and L3-4 discitis osteomyelitis. There has been L3-4 PLIF. The surrounding paravertebral fat is edematous. The pedicle screws at L3 and L4 show halo in/loosening. No evident collection. There  is subsequent MRI available. IMPRESSION: 1. L2-3 and L3-4 discitis osteomyelitis which involves L3-4 PLIF hardware. 2. No evidence of colitis or other inflammation to explain diarrhea. 3. Cholelithiasis. Electronically Signed   By: Marnee Spring M.D.   On: 06/12/2018 18:13   Ir Lumbar Disc Aspiration W/img Guide  Result Date: 06/14/2018 INDICATION: L2-3 discitis EXAM: L2-3 DISC ASPIRATION MEDICATIONS: The patient is currently admitted to the hospital and receiving intravenous antibiotics. The antibiotics were  administered within an appropriate time frame prior to the initiation of the procedure. ANESTHESIA/SEDATION: Fentanyl 100 mcg IV; Versed 2 mg IV Moderate Sedation Time:  10 minutes The patient was continuously monitored during the procedure by the interventional radiology nurse under my direct supervision. COMPLICATIONS: None immediate. PROCEDURE: Informed written consent was obtained from the patient after a thorough discussion of the procedural risks, benefits and alternatives. All questions were addressed. Maximal Sterile Barrier Technique was utilized including caps, mask, sterile gowns, sterile gloves, sterile drape, hand hygiene and skin antiseptic. A timeout was performed prior to the initiation of the procedure. The back was prepped and draped in a sterile fashion. 1% lidocaine was utilized for local anesthesia. Under fluoroscopic guidance, an 18 gauge needle was advanced into the L2-3 disc via right posterolateral approach. Two drops gray pus was aspirated. FINDINGS: Images document needle placement in the L2-3 disc. IMPRESSION: Successful L2-3 disc aspiration yielding 2 drops great pus. Electronically Signed   By: Jolaine Click M.D.   On: 06/14/2018 13:30     Assessment/Plan: Discitis/HW failure = will start on vancomycin and ceftriaxone 2gm IV daily empirically. Will change abtx based on culture results. Plan for 6 wk and will place picc line  HW failure = dr Otelia Sergeant maybe doing revision next week  Encephalopathy = maybe sundowning. Consider melatonin instead of ambien in the evening. Avoid sleeping in the day time.  Dr Luciana Axe available for questions over the weekend, and will follow up on culture data. I will see back on monday  [facility] Cell: [phone] Pager: [phone]  06/14/2018, 3:00 PM

## 2018-06-14 NOTE — Progress Notes (Signed)
Chief Complaint: Patient was seen in consultation today for osteomyelitis, discitis  Referring Physician(s): Dr. Drue Second  Supervising Physician: Jolaine Click  Patient Status: [facility] - Out-pt  History of Present Illness: Jeffrey Nelson is a 83 y.o. male with past medical history of CAD, HTN, DM, MI s/p CABG on chronic Plavix since 2017 who has chronic lower back pain and is recently s/p 3 lumbar surgeries since January 2019. He presented to [facility] ED 10/16 with worsening back pain.   MR [facility] 06/12/18 showed: 1. Constellation of imaging findings since the 12/05/2017 MRI is most compatible with prolonged Discitis Osteomyelitis at L3-L4, and extension of Discitis Osteomyelitis to L2-L3 since April. There is bilateral L3 through L5 spinal fusion hardware in place. Associated partial collapse of L3 with L3 and L4 endplate erosion. Abnormal L2-L3 disc and L2 inferior endplates now. 2. Bilateral psoas muscle edema/inflammation. No intramuscular or epidural abscess identified. 3. Multifactorial mild to moderate spinal stenosis at L2-L3 is new since April.  ID following for discitis/osteomyelitis and recommended disc aspiration.  IR consulted for same.  Case reviewed and approved by Dr. Bonnielee Haff.   Past Medical History:  Diagnosis Date  . Anginal pain (HCC)    occ  . Arthritis    "joints" (10/09/2017)  . Chronic lower back pain   . Complication of anesthesia    extremely claustrophobic; "have to be put down for MRI; don't sit in back seat of car;, etc."  . Coronary artery disease   . High cholesterol    "get shots twice/month" (10/09/2017)  . History of blood transfusion 2001   "related to CABG"  . History of kidney stones   . HOH (hard of hearing)    wearing hearing aids  . Hypertension   . Myocardial infarction (HCC) 2000s-06/2017 X 4  . Stroke [facility]) ~ 2015   "in my right eye; sight is coming back little by little" (10/09/2017)  . Type II diabetes mellitus (HCC)     Past  Surgical History:  Procedure Laterality Date  . ANTERIOR CERVICAL DECOMP/DISCECTOMY FUSION N/A 05/14/2015   Procedure: C3-4  ANTERIOR CERVICAL DISCECTOMY AND FUSION WITH PLATE AND SCREWS, LOCAL AND ALLOGRAFT BONE GRAFT;  Surgeon: Kerrin Champagne, MD;  Location: [facility] OR;  Service: Orthopedics;  Laterality: N/A;  . BACK SURGERY    . CARDIAC CATHETERIZATION     X 17 (10/09/2017)  . CATARACT EXTRACTION W/ INTRAOCULAR LENS  IMPLANT, BILATERAL Bilateral   . COLONOSCOPY    . CORONARY ANGIOPLASTY  06/2017  . CORONARY ANGIOPLASTY WITH STENT PLACEMENT     "6 stents total" (10/09/2017)  . CORONARY ARTERY BYPASS GRAFT  2001   CABG x4   . CYSTOSCOPY W/ STONE MANIPULATION    . JOINT REPLACEMENT    . LUMBAR LAMINECTOMY Right 10/13/2017   Procedure: MICRODISCECTOMY LUMBAR LAMINECTOMY RIGHT L3-4;  Surgeon: Kerrin Champagne, MD;  Location: [facility] OR;  Service: Orthopedics;  Laterality: Right;  . LUMBAR LAMINECTOMY Left 11/12/2017   Procedure: MICRODISCECTOMY L3-4 FOR RECURRENT HNP POSSIBLE FLURO;  Surgeon: Kerrin Champagne, MD;  Location: [facility] OR;  Service: Orthopedics;  Laterality: Left;  . LUMBAR LAMINECTOMY/DECOMPRESSION MICRODISCECTOMY N/A 01/16/2014   Procedure: Left L3-4 and L4-5 foraminotomies;  Surgeon: Kerrin Champagne, MD;  Location: [facility] OR;  Service: Orthopedics;  Laterality: N/A;  . NASAL FRACTURE SURGERY    . NASAL SINUS SURGERY     "several times since 1959"  . RADIOLOGY WITH ANESTHESIA N/A 10/12/2017   Procedure: MRI WITH ANESTHESIA;  Surgeon: Radiologist, Medication, MD;  Location: [facility] OR;  Service: Radiology;  Laterality: N/A;  . RADIOLOGY WITH ANESTHESIA N/A 12/05/2017   Procedure: MRI WITH ANESTHESIA;  Surgeon: Radiologist, Medication, MD;  Location: [facility] OR;  Service: Radiology;  Laterality: N/A;  . SHOULDER OPEN ROTATOR CUFF REPAIR Bilateral   . TOTAL KNEE ARTHROPLASTY Right 2012  . TYMPANOSTOMY TUBE PLACEMENT Bilateral   . ULNAR NERVE TRANSPOSITION Left 05/14/2015   Procedure: LEFT ULNAR NERVE DECOMPRESSION  AT THE ELBOW;  Surgeon: Kerrin Champagne, MD;  Location: [facility] OR;  Service: Orthopedics;  Laterality: Left;    Allergies: Ezetimibe and Statins  Medications: Prior to Admission medications   Medication Sig Start Date End Date Taking? Authorizing Provider  Acetaminophen (TYLENOL) 325 MG CAPS Take 650 mg by mouth as needed (PAIN).    Yes [provider]  alendronate (FOSAMAX) 70 MG tablet Take 1 tablet (70 mg total) by mouth once a week. Take with a full glass of water on an empty stomach. 05/13/18  Yes Deveshwar, Janalyn Rouse, MD  aspirin EC 81 MG tablet Take 81 mg by mouth daily.   Yes [provider]  baclofen (LIORESAL) 10 MG tablet Take 0.5-1 tablets (5-10 mg total) by mouth 3 (three) times daily as needed for muscle spasms. 05/10/18  Yes Hilts, Casimiro Needle, MD  Cholecalciferol (VITAMIN D3) 5000 units CAPS Take 5,000 Units by mouth daily.    Yes [provider]  clopidogrel (PLAVIX) 75 MG tablet Take 75 mg by mouth once.  05/07/18  Yes [provider]  Cranberry 400 MG CAPS Take 400 mg by mouth daily.    Yes [provider]  Evolocumab (REPATHA) 140 MG/ML SOSY Inject 140 mg into the skin every 14 (fourteen) days.   Yes [provider]  finasteride (PROSCAR) 5 MG tablet Take 5 mg by mouth daily.  12/28/17  Yes [provider]  gabapentin (NEURONTIN) 300 MG capsule Take 1 capsule (300 mg total) by mouth 2 (two) times daily. 04/24/18  Yes Kerrin Champagne, MD  metoprolol tartrate (LOPRESSOR) 50 MG tablet Take 1 tablet (50 mg total) by mouth 2 (two) times daily. Patient taking differently: Take 25 mg by mouth 2 (two) times daily.  12/17/17 12/17/18 Yes Buriev, Isaiah Serge, MD  tamsulosin (FLOMAX) 0.4 MG CAPS capsule Take 0.4 mg by mouth at bedtime.    Yes [provider]  Tapentadol HCl (NUCYNTA) 100 MG TABS Take one tablet every 6 hour prn pain 05/29/18  Yes Kerrin Champagne, MD  zolpidem (AMBIEN) 10 MG tablet Take 1 tablet (10 mg total) by mouth at  bedtime as needed for sleep. 05/29/18 06/28/18 Yes Kerrin Champagne, MD  gabapentin (NEURONTIN) 300 MG capsule TAKE 1 CAPSULE BY MOUTH THREE TIMES DAILY Patient not taking: Reported on 06/12/2018 06/10/18   Kerrin Champagne, MD  linaclotide [facility]) 145 MCG CAPS capsule Take 1 capsule (145 mcg total) by mouth daily before breakfast. 06/12/18   Kerrin Champagne, MD  ondansetron (ZOFRAN) 4 MG tablet Take 1 tablet (4 mg total) by mouth every 8 (eight) hours as needed for nausea or vomiting. Patient not taking: Reported on 06/12/2018 06/12/18   Kerrin Champagne, MD  simethicone [facility]) 40 MG/0.6ML drops Take 0.6 mLs (40 mg total) by mouth 4 (four) times daily as needed for flatulence. 06/12/18   Kerrin Champagne, MD  Teriparatide, Recombinant, 600 MCG/2.4ML SOLN Inject 0.08 mLs (20 mcg total) into the skin daily. 06/10/18   Pollyann Savoy, MD  Vitamin  D, Ergocalciferol, (DRISDOL) 50000 units CAPS capsule Take 1 capsule (50,000 Units total) by mouth every 7 (seven) days. Patient not taking: Reported on 06/12/2018 03/07/18   Kerrin Champagne, MD     Family History  Problem Relation Age of Onset  . CAD Brother   . Cancer Brother   . Alzheimer's disease Mother   . Healthy Daughter   . Healthy Daughter   . Diabetes Neg Hx     Social History   Socioeconomic History  . Marital status: Married    Spouse name: Not on file  . Number of children: Not on file  . Years of education: Not on file  . Highest education level: Not on file  Occupational History  . Not on file  Social Needs  . Financial resource strain: Not on file  . Food insecurity:    Worry: Not on file    Inability: Not on file  . Transportation needs:    Medical: Not on file    Non-medical: Not on file  Tobacco Use  . Smoking status: Former Smoker    Packs/day: 1.00    Years: 2.00    Pack years: 2.00    Last attempt to quit: 01/09/1957    Years since quitting: 61.4  . Smokeless tobacco: Never Used  Substance and Sexual Activity    . Alcohol use: No  . Drug use: Never  . Sexual activity: Not Currently  Lifestyle  . Physical activity:    Days per week: Not on file    Minutes per session: Not on file  . Stress: Not on file  Relationships  . Social connections:    Talks on phone: Not on file    Gets together: Not on file    Attends religious service: Not on file    Active member of club or organization: Not on file    Attends meetings of clubs or organizations: Not on file    Relationship status: Not on file  Other Topics Concern  . Not on file  Social History Narrative  . Not on file     Review of Systems: A 12 point ROS discussed and pertinent positives are indicated in the HPI above.  All other systems are negative.  Review of Systems  Constitutional: Negative for fatigue and fever.  Respiratory: Negative for cough and shortness of breath.   Cardiovascular: Negative for chest pain.  Gastrointestinal: Negative for abdominal pain, constipation, diarrhea, nausea and vomiting.  Musculoskeletal: Positive for back pain and gait problem.  Psychiatric/Behavioral: Negative for behavioral problems and confusion.    Vital Signs: BP (!) 148/76 (BP Location: Right Arm)   Pulse 72   Temp 98.7 F (37.1 C) (Oral)   Resp 18   Wt 205 lb 0.4 oz (93 kg)   SpO2 97%   BMI 26.32 kg/m   Physical Exam  Constitutional: He is oriented to person, place, and time. He appears well-developed. No distress.  Neck: Normal range of motion. Neck supple.  Cardiovascular: Normal rate, regular rhythm and normal heart sounds. Exam reveals no gallop and no friction rub.  No murmur heard. Pulmonary/Chest: Effort normal and breath sounds normal. No respiratory distress.  Abdominal: Soft. Bowel sounds are normal. He exhibits no distension. There is no tenderness.  Musculoskeletal: He exhibits tenderness (lower back).  Neurological: He is alert and oriented to person, place, and time.  Skin: Skin is warm and dry. He is not  diaphoretic.  Psychiatric: He has a normal mood and affect.  His behavior is normal. Judgment and thought content normal.  Nursing note and vitals reviewed.    MD Evaluation Airway: WNL Heart: WNL Abdomen: WNL Chest/ Lungs: WNL ASA  Classification: 3 Mallampati/Airway Score: Three   Imaging: Mr Lumbar Spine Wo Contrast  Result Date: 06/12/2018 CLINICAL DATA:  83 year old male with increased lumbar spine pain status post fusion 6 months ago. Fractures/collapse of the L3 vertebral body. EXAM: MRI LUMBAR SPINE WITHOUT CONTRAST TECHNIQUE: Multiplanar, multisequence MR imaging of the lumbar spine was performed. No intravenous contrast was administered. COMPARISON:  CT lumbar Spine [facility] 05/14/2018. Lumbar MRI 12/05/2017, and earlier. FINDINGS: Segmentation: Hypoplastic ribs assumed T12 as on the CT and April MRI numbering. Alignment: Straightening of lumbar lordosis is stable from last month. Vertebrae: Confluent marrow edema from the posterior inferior L2 body, throughout the L3 body, and into the superior and posterior L4 body. See series 7, image 9. This has progressed from the L3-L4 level endplate edema seen in April, with moderate collapse of L3 and erosion of the L3-L4 endplates since the prior MRI. Interbody implant at L3-L4. New abnormal increased T2 and STIR signal in the L2-L3 disc, with new L2 endplate and body marrow edema since April. The L1 and L5 levels appear stable and intact. Mild chronic T12 inferior endplate compression is stable. Intact visible lower thoracic levels. Intact visible sacrum and SI joints. Conus medullaris and cauda equina: Conus extends to the T12-L1 level. No lower spinal cord or conus signal abnormality. Paraspinal and other soft tissues: Confluent left greater than right medial psoas muscle edema as seen on series 6, image 13 and series 5, images 17 (on the left) and 1 (on the right). No intramuscular fluid collection is identified. The  STIR images demonstrate prominent marrow edema in the L3 transverse processes. Superimposed postoperative changes to the lower lumbar erector spinae muscles. Negative visible abdominal viscera. Disc levels: No Lower spinal stenosis above L2. L2-L3: New abnormal increased T2 and STIR signal within the disc. Progressed circumferential disc bulge with chronic mild to moderate facet and ligament flavum hypertrophy. New mild to moderate spinal stenosis. Chronic mild to moderate L2 foraminal stenosis greater on the right. L3-L4: Posterior and interbody fusion hardware here and prior decompression. No spinal stenosis. There appears to be soft tissue edema affecting the bilateral L3 neural foramina. L4-L5: Posterior and interbody fusion hardware plus decompression here. Mild residual right L4 foraminal stenosis related to facet hypertrophy. L5-S1: Stable and negative aside from mild facet hypertrophy. IMPRESSION: 1. Constellation of imaging findings since the 12/05/2017 MRI is most compatible with prolonged Discitis Osteomyelitis at L3-L4, and extension of Discitis Osteomyelitis to L2-L3 since April. There is bilateral L3 through L5 spinal fusion hardware in place. Associated partial collapse of L3 with L3 and L4 endplate erosion. Abnormal L2-L3 disc and L2 inferior endplates now. 2. Bilateral psoas muscle edema/inflammation. No intramuscular or epidural abscess identified. 3. Multifactorial mild to moderate spinal stenosis at L2-L3 is new since April. Electronically Signed   By: Odessa Fleming M.D.   On: 06/12/2018 16:14   Nm Bone Scan 3 Phase  Result Date: 06/06/2018 CLINICAL DATA:  SI joint pain, question spondyloarthropathy, painful loosening of hardware L3, LEFT sacral pain, prior L3-L5 fusion in April 2019, assess for sacral insufficiency fracture EXAM: NUCLEAR MEDICINE 3-PHASE BONE SCAN TECHNIQUE: Radionuclide angiographic images, immediate static blood pool images, and 3-hour delayed static images were obtained of the  lumbar spine, pelvis and hips after intravenous injection of radiopharmaceutical. RADIOPHARMACEUTICALS:  22 mCi Tc-49m MDP IV COMPARISON:  None Correlation: Lumbar spine radiographs 05/29/2018, pelvic radiograph 01/17/2018 FINDINGS: Vascular phase: Normal blood flow to the hips and pelvis Blood pool phase: Normal blood pool at the pelvis and hips. Mildly increased blood pool in the lumbar region at approximately the L3-L5 Delayed phase: Delayed images show normal tracer accumulation at the hip joints and SI joints. Normal tracer localization in the pelvis and proximal femora. No scintigraphic evidence of sacral insufficiency fracture. Diffusely increased tracer localization is seen in lumbar spine at L3-L5, predominantly at L3-L4, corresponding to the surgical levels identified on recent radiographs. These findings are consistent with the relative recent age of surgery and expected postoperative uptake. IMPRESSION: Normal scintigraphic appearance of the pelvis, SI joints, sacrum, and hip joints. Increased blood pool and delayed uptake of tracer in lower lumbar spine corresponding to prior L3-L5 fusion, with greatest uptake centered at L3-L4. Degree of uptake is consistent with preceding spine surgery. Due to postsurgical uptake, unable to exclude underlying infection. Electronically Signed   By: [name] M.D.   On: 06/06/2018 13:44   Ct Abdomen Pelvis W Contrast  Result Date: 06/12/2018 CLINICAL DATA:  Left lower quadrant pain with diarrhea. EXAM: CT ABDOMEN AND PELVIS WITH CONTRAST TECHNIQUE: Multidetector CT imaging of the abdomen and pelvis was performed using the standard protocol following bolus administration of intravenous contrast. CONTRAST:  100 cc Omnipaque 300 intravenous COMPARISON:  MRI of the lumbar spine from the same day FINDINGS: Lower chest:  Coronary calcification.  No acute finding Hepatobiliary: No focal liver abnormality.Single stone within the gallbladder which is full but shows no  inflammatory changes. Mild prominence of the biliary tree without calcified choledocholithiasis Pancreas: Unremarkable. Spleen: Unremarkable. 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 No hydronephrosis or stone. Subcentimeter low-densities in the left kidney, too small for densitometry. Unremarkable bladder. Stomach/Bowel:  No obstruction. No appendicitis. Vascular/Lymphatic: No acute vascular abnormality. Extensive atherosclerotic calcification of the aorta. No mass or adenopathy. Reproductive:Probable central prostate nodule.  No acute finding. Other: No ascites or pneumoperitoneum. Musculoskeletal: L2-3 and L3-4 discitis osteomyelitis. There has been L3-4 PLIF. The surrounding paravertebral fat is edematous. The pedicle screws at L3 and L4 show halo in/loosening. No evident collection. There is subsequent MRI available. IMPRESSION: 1. L2-3 and L3-4 discitis osteomyelitis which involves L3-4 PLIF hardware. 2. No evidence of colitis or other inflammation to explain diarrhea. 3. Cholelithiasis. Electronically Signed   By: Marnee Spring M.D.   On: 06/12/2018 18:13    Labs:  CBC: Recent Labs    05/03/18 0936 06/12/18 1308 06/13/18 0535 06/14/18 0547  WBC 9.4 8.6 7.2 8.2  HGB 12.5* 12.9* 12.1* 11.3*  HCT 37.8* 41.1 37.9* 35.1*  PLT 252 324 308 278    COAGS: Recent Labs    10/09/17 1226 06/14/18 0547  INR 1.01 1.05    BMP: Recent Labs    05/03/18 0936 06/12/18 1308 06/13/18 0535 06/14/18 0547  NA 141 137 137 137  K 4.8 3.8 4.3 3.9  CL 103 102 107 104  CO2 30 27 23 22   GLUCOSE 119* 145* 119* 124*  BUN 19 11 14 11   CALCIUM 9.7 9.5 8.7* 8.9  CREATININE 0.91 1.03 1.04 0.86  GFRNONAA 79 >60 >60 >60  GFRAA 91 >60 >60 >60    LIVER FUNCTION TESTS: Recent Labs    11/11/17 1644 12/03/17 1533 12/04/17 0342 02/06/18 1128 05/03/18 0936 06/12/18 1308  BILITOT 0.7 0.5 0.6  --  1.6* 1.5*  AST 23  25 18  --  19 20  ALT 19 16* 15*  --  12 22  ALKPHOS 122 165*  132*  --   --  171*  PROT 6.5 6.8 5.7* 7.1 7.2 7.3  ALBUMIN 3.1* 3.4* 2.9*  --   --  3.4*    TUMOR MARKERS: No results for input(s): AFPTM, CEA, CA199, CHROMGRNA in the last 8760 hours.  Assessment and Plan: Osteomyelitis/discitis L2-L3, L3-4 Patient followed by Orthopedics and ID.  IR consulted for disc aspiration prior to initiation of antibiotics.  Case and imaging reviewed by Dr. Bonnielee Haff who approves patient for procedure today.  He has been NPO.  Chronic Plavix noted.  Lovenox held.   Risks and benefits were discussed with the patient including, but not limited to  bleeding, infection, and low yield resulting in non-diagnostic procedure. Proceed in IR today.   Thank you for this interesting consult.  I greatly enjoyed meeting SHANA YOUNGE and look forward to participating in their care.  A copy of this report was sent to the requesting provider on this date.  Electronically Signed: Hoyt Koch, PA 06/14/2018, 9:05 AM   I spent a total of 40 Minutes    in face to face in clinical consultation, greater than 50% of which was counseling/coordinating care for osteomyelitis, discitis.

## 2018-06-14 NOTE — Progress Notes (Signed)
Peripherally Inserted Central Catheter/Midline Placement  The IV Nurse has discussed with the patient and/or persons authorized to consent for the patient, the purpose of this procedure and the potential benefits and risks involved with this procedure.  The benefits include less needle sticks, lab draws from the catheter, and the patient may be discharged home with the catheter. Risks include, but not limited to, infection, bleeding, blood clot (thrombus formation), and puncture of an artery; nerve damage and irregular heartbeat and possibility to perform a PICC exchange if needed/ordered by physician.  Alternatives to this procedure were also discussed.  Bard Power PICC patient education guide, fact sheet on infection prevention and patient information card has been provided to patient /or left at bedside.    PICC/Midline Placement Documentation  PICC Single Lumen 06/14/18 PICC Right Basilic 43 cm 0 cm (Active)  Indication for Insertion or Continuance of Line Home intravenous therapies (PICC only) 06/14/2018  6:28 PM  Exposed Catheter (cm) 0 cm 06/14/2018  6:28 PM  Site Assessment Clean;Dry;Intact 06/14/2018  6:28 PM  Line Status Flushed;Blood return noted 06/14/2018  6:28 PM  Dressing Type Transparent 06/14/2018  6:28 PM  Dressing Status Clean;Dry;Intact;Antimicrobial disc in place 06/14/2018  6:28 PM  Dressing Intervention New dressing 06/14/2018  6:28 PM  Dressing Change Due 06/21/18 06/14/2018  6:28 PM       Reginia Forts Albarece 06/14/2018, 6:30 PM

## 2018-06-14 NOTE — Progress Notes (Signed)
Patient ID: Jeffrey Nelson, male   DOB: Jan 19, 1935, 83 y.o.   MRN: [id number] IR radiology obtained 2 cc pus cultures pending.  Antibiotics to be restarted and adjusted based on cultures.  Dr. Otelia Sergeant to see patient on Monday to arrange later extending fusion several levels up for stability due to failure hardware and some anterior collapse of L3.

## 2018-06-14 NOTE — Progress Notes (Signed)
[facility] PROGRESS NOTE  Jeffrey Nelson ZOX:[id number] DOB: October 21, 1934 DOA: 06/12/2018  PCP: Deloris Ping, MD  Brief History/Interval Summary: 83 y.o. 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    Patient presented to his orthopedic surgeon with complains of worsening back pain over the last 2 weeks.  He underwent lumbar spine surgery with fusion earlier this year.  Patient was sent to the emergency department by the orthopedic surgeon.  MRI was done which raise concern for osteomyelitis at L3-L4 and L2-L3.  Patient was hospitalized for further management.   Reason for Visit: Lumbar discitis  Consultants: Orthopedics.  Infectious disease.  Interventional radiology  Procedures: L2-3 disc aspiration by interventional radiology  Antibiotics: Vancomycin and Zosyn was given at the time of admission.  Discontinued by ID.  Subjective/Interval History: Patient states that he continues to have significant back pain but is able to move his legs.  He became very anxious during the course of the night.  His wife is at the bedside this morning.    ROS: Denies any chest pain or shortness of breath  Objective:  Vital Signs  Vitals:   06/14/18 1005 06/14/18 1010 06/14/18 1015 06/14/18 1048  BP: (!) 152/78 (!) 163/68 (!) 168/68 121/69  Pulse: 72 68 74 (!) 55  Resp: 14 16 16 16   Temp:    98.2 F (36.8 C)  TempSrc:    Oral  SpO2: 98% 96% 96% 97%  Weight:        Intake/Output Summary (Last 24 hours) at 06/14/2018 1127 Last data filed at 06/14/2018 1043 Gross per 24 hour  Intake 861.6 ml  Output 1000 ml  Net -138.4 ml   Filed Weights   06/12/18 2041 06/13/18 2029  Weight: 93 kg 93 kg    General appearance: He is awake alert.  Cooperative.  In  no distress Resp: Normal effort.  Clear to auscultation bilaterally Cardio: S1-S2 is normal regular.  No S3-S4.  No rubs murmurs or bruit GI: Abdomen is soft.  Nontender nondistended.  Bowel sounds are present normal.  No masses or organomegaly Extremities: No edema Neurologic: Alert and oriented x3.  Cranial nerves II to XII intact.  Motor strength equal bilateral upper and lower extremities.  Lab Results:  Data Reviewed: I have personally reviewed following labs and imaging studies  CBC: Recent Labs  Lab 06/12/18 1308 06/13/18 0535 06/14/18 0547  WBC 8.6 7.2 8.2  NEUTROABS 6.5  --   --   HGB 12.9* 12.1* 11.3*  HCT 41.1 37.9* 35.1*  MCV 94.7 95.7 96.2  PLT 324 308 278    Basic Metabolic Panel: Recent Labs  Lab 06/12/18 1308 06/13/18 0535 06/14/18 0547  NA 137 137 137  K 3.8 4.3 3.9  CL 102 107 104  CO2 27 23 22   GLUCOSE 145* 119* 124*  BUN 11 14 11   CREATININE 1.03 1.04 0.86  CALCIUM 9.5 8.7* 8.9    GFR: Estimated Creatinine Clearance: 78.3 mL/min (by C-G formula based on SCr of 0.86 mg/dL).  Liver Function Tests: Recent Labs  Lab 06/12/18 1308  AST 20  ALT 22  ALKPHOS 171*  BILITOT 1.5*  PROT 7.3  ALBUMIN 3.4*    Recent Labs  Lab 06/12/18 1308  LIPASE 22   CBG: Recent Labs  Lab 06/13/18 0747 06/13/18 1251 06/13/18  1658 06/13/18 2029 06/14/18 0728  GLUCAP 108* 128* 99 142* 130*     Recent Results (from the past 240 hour(s))  Blood culture (routine x 2)     Status: None (Preliminary result)   Collection Time: 06/12/18  1:28 PM  Result Value Ref Range Status   Specimen Description BLOOD LEFT ANTECUBITAL  Final   Special Requests   Final    BOTTLES DRAWN AEROBIC AND ANAEROBIC Blood Culture adequate volume   Culture   Final    NO GROWTH 2 DAYS Performed at [facility], [address], [place], [place] [address]    Report Status PENDING  Incomplete  Blood culture (routine x 2)     Status: None (Preliminary result)   Collection  Time: 06/12/18  1:29 PM  Result Value Ref Range Status   Specimen Description BLOOD RIGHT ANTECUBITAL  Final   Special Requests   Final    BOTTLES DRAWN AEROBIC AND ANAEROBIC Blood Culture adequate volume   Culture   Final    NO GROWTH 2 DAYS Performed at [facility], [address], [place], [place] [address]    Report Status PENDING  Incomplete      Radiology Studies: Mr Lumbar Spine Wo Contrast  Result Date: 06/12/2018 CLINICAL DATA:  83 year old male with increased lumbar spine pain status post fusion 6 months ago. Fractures/collapse of the L3 vertebral body. EXAM: MRI LUMBAR SPINE WITHOUT CONTRAST TECHNIQUE: Multiplanar, multisequence MR imaging of the lumbar spine was performed. No intravenous contrast was administered. COMPARISON:  CT lumbar Spine [facility] 05/14/2018. Lumbar MRI 12/05/2017, and earlier. FINDINGS: Segmentation: Hypoplastic ribs assumed T12 as on the CT and April MRI numbering. Alignment: Straightening of lumbar lordosis is stable from last month. Vertebrae: Confluent marrow edema from the posterior inferior L2 body, throughout the L3 body, and into the superior and posterior L4 body. See series 7, image 9. This has progressed from the L3-L4 level endplate edema seen in April, with moderate collapse of L3 and erosion of the L3-L4 endplates since the prior MRI. Interbody implant at L3-L4. New abnormal increased T2 and STIR signal in the L2-L3 disc, with new L2 endplate and body marrow edema since April. The L1 and L5 levels appear stable and intact. Mild chronic T12 inferior endplate compression is stable. Intact visible lower thoracic levels. Intact visible sacrum and SI joints. Conus medullaris and cauda equina: Conus extends to the T12-L1 level. No lower spinal cord or conus signal abnormality. Paraspinal and other soft tissues: Confluent left greater than right medial psoas muscle edema as seen on series 6, image 13 and series 5, images 17 (on  the left) and 1 (on the right). No intramuscular fluid collection is identified. The STIR images demonstrate prominent marrow edema in the L3 transverse processes. Superimposed postoperative changes to the lower lumbar erector spinae muscles. Negative visible abdominal viscera. Disc levels: No Lower spinal stenosis above L2. L2-L3: New abnormal increased T2 and STIR signal within the disc. Progressed circumferential disc bulge with chronic mild to moderate facet and ligament flavum hypertrophy. New mild to moderate spinal stenosis. Chronic mild to moderate L2 foraminal stenosis greater on the right. L3-L4: Posterior and interbody fusion hardware here and prior decompression. No spinal stenosis. There appears to be soft tissue edema affecting the bilateral L3 neural foramina. L4-L5: Posterior and interbody fusion hardware plus decompression here. Mild residual right L4 foraminal stenosis related to facet hypertrophy. L5-S1: Stable and negative aside from mild facet  hypertrophy. IMPRESSION: 1. Constellation of imaging findings since the 12/05/2017 MRI is most compatible with prolonged Discitis Osteomyelitis at L3-L4, and extension of Discitis Osteomyelitis to L2-L3 since April. There is bilateral L3 through L5 spinal fusion hardware in place. Associated partial collapse of L3 with L3 and L4 endplate erosion. Abnormal L2-L3 disc and L2 inferior endplates now. 2. Bilateral psoas muscle edema/inflammation. No intramuscular or epidural abscess identified. 3. Multifactorial mild to moderate spinal stenosis at L2-L3 is new since April. Electronically Signed   By: Odessa Fleming M.D.   On: 06/12/2018 16:14   Ct Abdomen Pelvis W Contrast  Result Date: 06/12/2018 CLINICAL DATA:  Left lower quadrant pain with diarrhea. EXAM: CT ABDOMEN AND PELVIS WITH CONTRAST TECHNIQUE: Multidetector CT imaging of the abdomen and pelvis was performed using the standard protocol following bolus administration of intravenous contrast. CONTRAST:   100 cc Omnipaque 300 intravenous COMPARISON:  MRI of the lumbar spine from the same day FINDINGS: Lower chest:  Coronary calcification.  No acute finding Hepatobiliary: No focal liver abnormality.Single stone within the gallbladder which is full but shows no inflammatory changes. Mild prominence of the biliary tree without calcified choledocholithiasis Pancreas: Unremarkable. Spleen: Unremarkable. 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 No hydronephrosis or stone. Subcentimeter low-densities in the left kidney, too small for densitometry. Unremarkable bladder. Stomach/Bowel:  No obstruction. No appendicitis. Vascular/Lymphatic: No acute vascular abnormality. Extensive atherosclerotic calcification of the aorta. No mass or adenopathy. Reproductive:Probable central prostate nodule.  No acute finding. Other: No ascites or pneumoperitoneum. Musculoskeletal: L2-3 and L3-4 discitis osteomyelitis. There has been L3-4 PLIF. The surrounding paravertebral fat is edematous. The pedicle screws at L3 and L4 show halo in/loosening. No evident collection. There is subsequent MRI available. IMPRESSION: 1. L2-3 and L3-4 discitis osteomyelitis which involves L3-4 PLIF hardware. 2. No evidence of colitis or other inflammation to explain diarrhea. 3. Cholelithiasis. Electronically Signed   By: Marnee Spring M.D.   On: 06/12/2018 18:13     Medications:  Scheduled: . acetaminophen  650 mg Oral Q6H  . aspirin EC  81 mg Oral Daily  . clopidogrel  75 mg Oral Daily  . enoxaparin (LOVENOX) injection  40 mg Subcutaneous Q24H  . feeding supplement (ENSURE ENLIVE)  237 mL Oral BID BM  . fentaNYL      . finasteride  5 mg Oral Daily  . insulin aspart  0-9 Units Subcutaneous TID WC  . lidocaine      . metoprolol tartrate  25 mg Oral BID  . midazolam      . pantoprazole  40 mg Oral Daily  . polyethylene glycol  17 g Oral Daily  . tamsulosin  0.4 mg Oral QPC supper   Continuous: . sodium chloride 50  mL/hr at 06/13/18 2330   ZOX:[id number], HYDROmorphone (DILAUDID) injection, ondansetron, oxyCODONE, simethicone  Assessment/Plan:    L2-L3, L3-4 discitis/osteomyelitis involving hardware Patient was admitted to the hospital and placed on vancomycin and Zosyn.  Does not have any neurological deficits currently.  Patient was seen by orthopedic surgeon.  Plan is for surgery during the course of this hospitalization.  Infectious disease was consulted.  Subsequently IR was consulted to aspirate the area of infection so that we have culture data.  Patient to undergo aspiration today.  Antibiotics on hold currently.  Continue pain medications.  Bowel regimen.   Coronary artery disease status post CABG and angioplasty Currently stable.  Continue with dual antiplatelet treatment with aspirin and Plavix.  Continue beta-blockers.  Last  cardiac catheterization was done in July 2018.  Last stent placement was in November 2017.  Should be okay to hold his Plavix as orthopedics is considering surgical intervention in the near future.  We will hold his Plavix.  Diabetes mellitus type 2 Apparently diet controlled at home.  Continue to monitor CBGs.  History of BPH Continue tamsulosin and finasteride.  DVT Prophylaxis: Lovenox    Code Status: Full code Family Communication: Discussed with the patient and his wife Disposition Plan: Management as outlined above.  IR to aspirate the area of discitis today.  Antibiotics per infectious disease.    LOS: 2 days   Osvaldo Shipper  [facility] Pager [phone] 06/14/2018, 11:27 AM  If 7PM-7AM, please contact night-coverage at [email or web address], password [facility]

## 2018-06-14 NOTE — Procedures (Signed)
L2/3 disc aspiration 2 drops pus EBL 0 Comp 0

## 2018-06-14 NOTE — Progress Notes (Signed)
[facility]  [facility] Infusion Coordinator will follow pt with ID team to support Home Infusion Pharm[facility] services at DC for home IV ABX if home is DC disposition.  If patient discharges after hours, please call [phone].   Sedalia Muta 06/14/2018, 11:03 AM

## 2018-06-15 DIAGNOSIS — R410 Disorientation, unspecified: Secondary | ICD-10-CM

## 2018-06-15 DIAGNOSIS — M4640 Discitis, unspecified, site unspecified: Secondary | ICD-10-CM

## 2018-06-15 LAB — GLUCOSE, CAPILLARY
GLUCOSE-CAPILLARY: 130 mg/dL — AB (ref 70–99)
GLUCOSE-CAPILLARY: 193 mg/dL — AB (ref 70–99)
Glucose-Capillary: 136 mg/dL — ABNORMAL HIGH (ref 70–99)
Glucose-Capillary: 123 mg/dL — ABNORMAL HIGH (ref 70–99)

## 2018-06-15 MED ORDER — HALOPERIDOL LACTATE 5 MG/ML IJ SOLN
5.0000 mg | Freq: Once | INTRAMUSCULAR | Status: AC
  Administered 2018-06-15: 5 mg via INTRAVENOUS
  Filled 2018-06-15: qty 1

## 2018-06-15 MED ORDER — TRAZODONE HCL 50 MG PO TABS
50.0000 mg | ORAL_TABLET | Freq: Every evening | ORAL | Status: DC | PRN
  Administered 2018-06-16 – 2018-06-25 (×10): 50 mg via ORAL
  Filled 2018-06-15 (×10): qty 1

## 2018-06-15 MED ORDER — LORAZEPAM 2 MG/ML IJ SOLN
1.0000 mg | Freq: Once | INTRAMUSCULAR | Status: AC
  Administered 2018-06-15: 1 mg via INTRAVENOUS
  Filled 2018-06-15: qty 1

## 2018-06-15 NOTE — Progress Notes (Signed)
   Subjective:    Patient reports pain as mild and moderate.    Objective: Vital signs in last 24 hours: Temp:  [98.2 F (36.8 C)-98.5 F (36.9 C)] 98.3 F (36.8 C) (10/19 0915) Pulse Rate:  [55-69] 64 (10/19 0915) Resp:  [18-19] 18 (10/19 0915) BP: (127-161)/(59-80) 161/80 (10/19 0915) SpO2:  [96 %-98 %] 96 % (10/19 0915)  Intake/Output from previous day: 10/18 0701 - 10/19 0700 In: 1498.7 [P.O.:210; I.V.:888.7; IV Piggyback:400] Out: 602 [Urine:601; Stool:1] Intake/Output this shift: Total I/O In: 240 [P.O.:240] Out: -   Recent Labs    06/12/18 1308 06/13/18 0535 06/14/18 0547  HGB 12.9* 12.1* 11.3*   Recent Labs    06/13/18 0535 06/14/18 0547  WBC 7.2 8.2  RBC 3.96* 3.65*  HCT 37.9* 35.1*  PLT 308 278   Recent Labs    06/13/18 0535 06/14/18 0547  NA 137 137  K 4.3 3.9  CL 107 104  CO2 23 22  BUN 14 11  CREATININE 1.04 0.86  GLUCOSE 119* 124*  CALCIUM 8.7* 8.9   Recent Labs    06/14/18 0547  INR 1.05    in bed. some back pain. motor bilateral feet intact.  [place] Ekg Site Rite  Result Date: 06/14/2018 If Site Rite image not attached, placement could not be confirmed due to current cardiac rhythm.   Assessment/Plan:    Continue ABX therapy due to Post-op infection. Gram stain polys no organisms, awaiting cultures. Dr. Otelia Sergeant to see Monday.   Jeffrey Nelson 06/15/2018, 10:56 AM

## 2018-06-15 NOTE — Progress Notes (Signed)
RN called to the room because patient was taking off gown and trying to roam the halls with no hospital gown. RN at bedside. Found patient only oriented to self, agitated, combative, and impulsive. Patient pulled out peripheral IV and kept fidgeting with right upper arm PICC line. Even with redirection, patient continuously attempts to pull at PICC line. RN called security to help assist patient to the bed.  RN notified on call NP Bodenheimer. Order placed for 5mg  of Haldol IV. Medication administered and patient at rest. Bed alarm turned on.  15 minutes later, bed alarm went off and RN ran to the room. Found patient at the end of the bed attempting to get up. Patient stated he wanted to use the bathroom. RN assisted but patient rushed to the door. Patient fought through Charity fundraiser and NT and ran out in the hall nude. Security and floor staff at the bedside to assist with the patient.  RN notified Bodenheimer. Orders for Soft wrist restraints, Waist restraints, 4 side rails up, and 1mg  of Ativan IV placed. RN implemented. Patient obtained a skin tear on right forearm in attempt to fight staff while wrist restraints were being applied.  RN notified patients wife, Lupita Leash, of occurrence. Wife states she will come and stay the night with her husband.   Patient now resting and RN will complete restraint monitoring every 2 hours. RN will continue to monitor patient.  Veatrice Kells, RN

## 2018-06-15 NOTE — Progress Notes (Signed)
[facility] PROGRESS NOTE  SHAUNE WESTFALL ZOX:[id number] DOB: 05/17/1935 DOA: 06/12/2018  PCP: Deloris Ping, MD  Brief History/Interval Summary: 83 y.o. 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    Patient presented to his orthopedic surgeon with complains of worsening back pain over the last 2 weeks.  He underwent lumbar spine surgery with fusion earlier this year.  Patient was sent to the emergency department by the orthopedic surgeon.  MRI was done which raise concern for osteomyelitis at L3-L4 and L2-L3.  Patient was hospitalized for further management.   Reason for Visit: Lumbar discitis  Consultants: Orthopedics.  Infectious disease.  Interventional radiology  Procedures: L2-3 disc aspiration by interventional radiology 10/18  Antibiotics: Vancomycin and Zosyn was given at the time of admission.  Discontinued by ID.  Changed over to vancomycin and ceftriaxone  Subjective/Interval History: Patient apparently had episodes of agitation overnight.  He initially required restraints which have been removed this morning.  His wife is at the bedside.  Patient denies any new complaints.  He is hard of hearing.  Continues to have some back pain.  ROS: Denies any chest pain or shortness of breath.  Objective:  Vital Signs  Vitals:   06/14/18 2118 06/14/18 2148 06/15/18 0452 06/15/18 0915  BP: (!) 131/59  (!) 161/63 (!) 161/80  Pulse: (!) 58 64 69 64  Resp: 18  19 18   Temp: 98.3 F (36.8 C)  98.2 F (36.8 C) 98.3 F (36.8 C)  TempSrc:    Oral  SpO2: 97%  97% 96%  Weight:        Intake/Output Summary (Last 24 hours) at 06/15/2018 0929 Last data filed at 06/15/2018 0645 Gross per 24 hour  Intake 1498.67 ml  Output 602 ml  Net 896.67  ml   Filed Weights   06/12/18 2041 06/13/18 2029  Weight: 93 kg 93 kg    General appearance: He is awake alert.  Cooperative.  In no distress. Resp: Normal effort.  Clear to auscultation bilaterally Cardio: S1-S2 is normal regular.  No S3-S4.  No rubs murmurs. GI: Abdomen is soft.  Nondistended.  Bowel sounds are present normal.  No masses organomegaly. Extremities: No edema Neurologic: Awake and alert.  No focal neurological deficits.  Good strength in both his lower extremities.    Lab Results:  Data Reviewed: I have personally reviewed following labs and imaging studies  CBC: Recent Labs  Lab 06/12/18 1308 06/13/18 0535 06/14/18 0547  WBC 8.6 7.2 8.2  NEUTROABS 6.5  --   --   HGB 12.9* 12.1* 11.3*  HCT 41.1 37.9* 35.1*  MCV 94.7 95.7 96.2  PLT 324 308 278    Basic Metabolic Panel: Recent Labs  Lab 06/12/18 1308 06/13/18 0535 06/14/18 0547  NA 137 137 137  K 3.8 4.3 3.9  CL 102 107 104  CO2 27 23 22   GLUCOSE 145* 119* 124*  BUN 11 14 11   CREATININE 1.03 1.04 0.86  CALCIUM 9.5 8.7* 8.9    GFR: Estimated Creatinine Clearance: 78.3 mL/min (by C-G formula based on SCr of 0.86 mg/dL).  Liver Function Tests: Recent Labs  Lab 06/12/18 1308  AST 20  ALT 22  ALKPHOS 171*  BILITOT 1.5*  PROT 7.3  ALBUMIN 3.4*    Recent Labs  Lab 06/12/18 1308  LIPASE  22   CBG: Recent Labs  Lab 06/14/18 0728 06/14/18 1136 06/14/18 1626 06/14/18 2120 06/15/18 0732  GLUCAP 130* 114* 156* 126* 130*     Recent Results (from the past 240 hour(s))  Blood culture (routine x 2)     Status: None (Preliminary result)   Collection Time: 06/12/18  1:28 PM  Result Value Ref Range Status   Specimen Description BLOOD LEFT ANTECUBITAL  Final   Special Requests   Final    BOTTLES DRAWN AEROBIC AND ANAEROBIC Blood Culture adequate volume   Culture   Final    NO GROWTH 2 DAYS Performed at [facility], [address] [place], [place] [address]    Report Status  PENDING  Incomplete  Blood culture (routine x 2)     Status: None (Preliminary result)   Collection Time: 06/12/18  1:29 PM  Result Value Ref Range Status   Specimen Description BLOOD RIGHT ANTECUBITAL  Final   Special Requests   Final    BOTTLES DRAWN AEROBIC AND ANAEROBIC Blood Culture adequate volume   Culture   Final    NO GROWTH 2 DAYS Performed at [facility], [address] [place], [place] [address]    Report Status PENDING  Incomplete  Aerobic/Anaerobic Culture (surgical/deep wound)     Status: None (Preliminary result)   Collection Time: 06/14/18 10:20 AM  Result Value Ref Range Status   Specimen Description BACK  Final   Special Requests DISC  Final   Gram Stain   Final    FEW WBC PRESENT,BOTH PMN AND MONONUCLEAR NO ORGANISMS SEEN Performed at [facility], [address], [place], [place] [address]    Culture PENDING  Incomplete   Report Status PENDING  Incomplete      Radiology Studies: [place] Ekg Site Rite  Result Date: 06/14/2018 If Site Rite image not attached, placement could not be confirmed due to current cardiac rhythm.  Ir Lumbar Disc Aspiration W/img Guide  Result Date: 06/14/2018 INDICATION: L2-3 discitis EXAM: L2-3 DISC ASPIRATION MEDICATIONS: The patient is currently admitted to the hospital and receiving intravenous antibiotics. The antibiotics were administered within an appropriate time frame prior to the initiation of the procedure. ANESTHESIA/SEDATION: Fentanyl 100 mcg IV; Versed 2 mg IV Moderate Sedation Time:  10 minutes The patient was continuously monitored during the procedure by the interventional radiology nurse under my direct supervision. COMPLICATIONS: None immediate. PROCEDURE: Informed written consent was obtained from the patient after a thorough discussion of the procedural risks, benefits and alternatives. All questions were addressed. Maximal Sterile Barrier Technique was utilized including caps, mask, sterile gowns, sterile  gloves, sterile drape, hand hygiene and skin antiseptic. A timeout was performed prior to the initiation of the procedure. The back was prepped and draped in a sterile fashion. 1% lidocaine was utilized for local anesthesia. Under fluoroscopic guidance, an 18 gauge needle was advanced into the L2-3 disc via right posterolateral approach. Two drops gray pus was aspirated. FINDINGS: Images document needle placement in the L2-3 disc. IMPRESSION: Successful L2-3 disc aspiration yielding 2 drops great pus. Electronically Signed   By: Jolaine Click M.D.   On: 06/14/2018 13:30     Medications:  Scheduled: . acetaminophen  650 mg Oral Q6H  . aspirin EC  81 mg Oral Daily  . enoxaparin (LOVENOX) injection  40 mg Subcutaneous Q24H  . feeding supplement (ENSURE ENLIVE)  237 mL Oral BID BM  . finasteride  5 mg Oral Daily  .  insulin aspart  0-9 Units Subcutaneous TID WC  . metoprolol tartrate  25 mg Oral BID  . pantoprazole  40 mg Oral Daily  . polyethylene glycol  17 g Oral Daily  . tamsulosin  0.4 mg Oral QPC supper   Continuous: . sodium chloride 50 mL/hr at 06/14/18 1856  . cefTRIAXone (ROCEPHIN)  IV 2 g (06/14/18 1442)  . vancomycin 1,000 mg (06/15/18 0227)   KGM:[id number], HYDROmorphone (DILAUDID) injection, ondansetron, oxyCODONE, simethicone, sodium chloride flush, traZODone  Assessment/Plan:    L2-L3, L3-4 discitis/osteomyelitis involving hardware Patient was admitted to the hospital and placed on vancomycin and Zosyn.  No neurological deficits were present.  Patient was seen by orthopedic surgeon.  Plan is for surgery during the course of this hospitalization.  Infectious disease was consulted.  Subsequently IR was consulted to aspirate the area of infection so that we have culture data.  Patient underwent aspiration on 10/18.  No organisms seen on Gram stain.  Final culture is pending.  Started on vancomycin and ceftriaxone by infectious disease.  Continue pain medications.  Bowel  regimen.  Per ID he will need long-term IV antibiotics, at least for 6 weeks.  PICC line has been placed.  Acute delirium Occurred during the night.  Could be related to Ambien which has been discontinued.  Changed over to trazodone.  Mentation is back to baseline this morning.  Coronary artery disease status post CABG and angioplasty Currently stable.  Continue with dual antiplatelet treatment with aspirin and Plavix.  Continue beta-blockers.  Last cardiac catheterization was done in July 2018.  Last stent placement was in November 2017.  Should be okay to hold his Plavix as orthopedics is considering surgical intervention in the near future.  We will hold his Plavix.  Diabetes mellitus type 2 Apparently diet controlled at home.  Continue to monitor CBGs.  Essential hypertension Blood pressure noted to be slightly elevated.  Possibly due to his agitation.  Continue beta-blocker.  May need additional agent if blood pressure not optimally controlled.  History of BPH Continue tamsulosin and finasteride.  DVT Prophylaxis: Lovenox    Code Status: Full code Family Communication: Discussed with the patient and his wife Disposition Plan: Continue management as outlined above.  Anticipate surgery early next week.    LOS: 3 days   Osvaldo Shipper  [facility] Pager [phone] 06/15/2018, 9:29 AM  If 7PM-7AM, please contact night-coverage at [email or web address], password [facility]

## 2018-06-15 NOTE — Progress Notes (Signed)
    [facility]   Reason for visit: Follow up on discitis  Interval History: no new culture growth, no fever, no associated rash or diarrhea;  Pain stable with medication.     Physical Exam: Constitutional:  Vitals:   06/15/18 0452 06/15/18 0915  BP: (!) 161/63 (!) 161/80  Pulse: 69 64  Resp: 19 18  Temp: 98.2 F (36.8 C) 98.3 F (36.8 C)  SpO2: 97% 96%   patient appears in NAD Eyes: anicteric Respiratory: Normal respiratory effort; CTA B Cardiovascular: RRR GI: soft, nt, nd Skin: no rashes  Review of Systems: Constitutional: negative for fevers and chills Gastrointestinal: negative for nausea and diarrhea Integument/breast: negative for rash  Lab Results  Component Value Date   WBC 8.2 06/14/2018   HGB 11.3 (L) 06/14/2018   HCT 35.1 (L) 06/14/2018   MCV 96.2 06/14/2018   PLT 278 06/14/2018    Lab Results  Component Value Date   CREATININE 0.86 06/14/2018   BUN 11 06/14/2018   NA 137 06/14/2018   K 3.9 06/14/2018   CL 104 06/14/2018   CO2 22 06/14/2018    Lab Results  Component Value Date   ALT 22 06/12/2018   AST 20 06/12/2018   ALKPHOS 171 (H) 06/12/2018     Microbiology: Recent Results (from the past 240 hour(s))  Blood culture (routine x 2)     Status: None (Preliminary result)   Collection Time: 06/12/18  1:28 PM  Result Value Ref Range Status   Specimen Description BLOOD LEFT ANTECUBITAL  Final   Special Requests   Final    BOTTLES DRAWN AEROBIC AND ANAEROBIC Blood Culture adequate volume   Culture   Final    NO GROWTH 3 DAYS Performed at [facility], [address], [place], [place] [address]    Report Status PENDING  Incomplete  Blood culture (routine x 2)     Status: None (Preliminary result)   Collection Time: 06/12/18  1:29 PM  Result Value Ref Range Status   Specimen Description BLOOD RIGHT ANTECUBITAL  Final   Special Requests   Final    BOTTLES DRAWN AEROBIC AND ANAEROBIC Blood Culture adequate  volume   Culture   Final    NO GROWTH 3 DAYS Performed at [facility], [address], [place], [place] [address]    Report Status PENDING  Incomplete  Aerobic/Anaerobic Culture (surgical/deep wound)     Status: None (Preliminary result)   Collection Time: 06/14/18 10:20 AM  Result Value Ref Range Status   Specimen Description BACK  Final   Special Requests DISC  Final   Gram Stain   Final    FEW WBC PRESENT,BOTH PMN AND MONONUCLEAR NO ORGANISMS SEEN    Culture   Final    NO GROWTH 1 DAY Performed at [facility], [address] [place], [place] [address]    Report Status PENDING  Incomplete    Impression/Plan:  1. Discitis - s/p aspiration.  No positive cultures.  Continue with vancomycin and ceftriaxone.  2.  Medication monitoring - last creat wnl.  Checking tomorrow.    Dr. Drue Second back on Monday

## 2018-06-15 NOTE — Progress Notes (Signed)
RN assessed patient this am and patient no longer in need of restraints. Patient is alert and oriented x4. Does not recall actions from earlier on in the shift. Answering all questions appropriately. RN walked patient to the bathroom and patient is able to follow instructions. Patient no longer interfering with care. Wife at bedside.  RN will discontinue restraints. RN will continue to monitor patient.  Veatrice Kells, RN

## 2018-06-16 LAB — BASIC METABOLIC PANEL
Anion gap: 5 (ref 5–15)
BUN: 9 mg/dL (ref 8–23)
CO2: 29 mmol/L (ref 22–32)
Calcium: 8.9 mg/dL (ref 8.9–10.3)
Chloride: 103 mmol/L (ref 98–111)
Creatinine, Ser: 0.78 mg/dL (ref 0.61–1.24)
GFR calc Af Amer: >60 mL/min (ref >60)
GFR calc non Af Amer: >60 mL/min (ref >60)
Glucose, Bld: 133 mg/dL — ABNORMAL HIGH (ref 70–99)
Potassium: 3.7 mmol/L (ref 3.5–5.1)
Sodium: 137 mmol/L (ref 135–145)

## 2018-06-16 LAB — CBC
HCT: 33.8 % — ABNORMAL LOW (ref 39.0–52.0)
Hemoglobin: 10.9 g/dL — ABNORMAL LOW (ref 13.0–17.0)
MCH: 30.8 pg (ref 26.0–34.0)
MCHC: 32.2 g/dL (ref 30.0–36.0)
MCV: 95.5 fL (ref 80.0–100.0)
NRBC: 0 % (ref 0.0–0.2)
Platelets: 294 10*3/uL (ref 150–400)
RBC: 3.54 MIL/uL — AB (ref 4.22–5.81)
RDW: 13.6 % (ref 11.5–15.5)
WBC: 8.4 10*3/uL (ref 4.0–10.5)

## 2018-06-16 LAB — GLUCOSE, CAPILLARY
GLUCOSE-CAPILLARY: 115 mg/dL — AB (ref 70–99)
GLUCOSE-CAPILLARY: 126 mg/dL — AB (ref 70–99)
GLUCOSE-CAPILLARY: 157 mg/dL — AB (ref 70–99)
Glucose-Capillary: 122 mg/dL — ABNORMAL HIGH (ref 70–99)

## 2018-06-16 MED ORDER — FLEET ENEMA 7-19 GM/118ML RE ENEM
1.0000 | ENEMA | Freq: Every day | RECTAL | Status: DC | PRN
  Filled 2018-06-16: qty 1

## 2018-06-16 MED ORDER — SENNA 8.6 MG PO TABS
1.0000 | ORAL_TABLET | Freq: Two times a day (BID) | ORAL | Status: DC
  Administered 2018-06-16 – 2018-06-26 (×19): 8.6 mg via ORAL
  Filled 2018-06-16 (×20): qty 1

## 2018-06-16 MED ORDER — POLYETHYLENE GLYCOL 3350 17 G PO PACK
17.0000 g | PACK | Freq: Two times a day (BID) | ORAL | Status: DC
  Administered 2018-06-16 – 2018-06-25 (×15): 17 g via ORAL
  Filled 2018-06-16 (×19): qty 1

## 2018-06-16 NOTE — Progress Notes (Signed)
   Subjective:    Patient reports pain as moderate.    Objective: Vital signs in last 24 hours: Temp:  [97.5 F (36.4 C)-98.6 F (37 C)] 98.5 F (36.9 C) (10/20 1000) Pulse Rate:  [63-69] 63 (10/20 1000) Resp:  [18-19] 18 (10/20 1000) BP: (130-139)/(66-71) 137/68 (10/20 1000) SpO2:  [93 %-96 %] 96 % (10/20 1000) Weight:  [93.2 kg] 93.2 kg (10/19 2038)  Intake/Output from previous day: 10/19 0701 - 10/20 0700 In: 2412.9 [P.O.:1320; I.V.:492.8; IV Piggyback:600] Out: 1250 [Urine:1250] Intake/Output this shift: Total I/O In: 240 [P.O.:240] Out: 550 [Urine:550]  Recent Labs    06/14/18 0547 06/16/18 0415  HGB 11.3* 10.9*   Recent Labs    06/14/18 0547 06/16/18 0415  WBC 8.2 8.4  RBC 3.65* 3.54*  HCT 35.1* 33.8*  PLT 278 294   Recent Labs    06/14/18 0547 06/16/18 0415  NA 137 137  K 3.9 3.7  CL 104 103  CO2 22 29  BUN 11 9  CREATININE 0.86 0.78  GLUCOSE 124* 133*  CALCIUM 8.9 8.9   Recent Labs    06/14/18 0547  INR 1.05    in recliner. moving better.  No results found.  Assessment/Plan:    Continue ABX therapy due to Post-op infection. Cultures neg so far.   Eldred Manges 06/16/2018, 12:22 PM

## 2018-06-16 NOTE — Progress Notes (Signed)
[facility] PROGRESS NOTE  Jeffrey Nelson:[id number] DOB: [birth year] DOA: 06/12/2018  PCP: Deloris Ping, MD  Brief History/Interval Summary: 83 y.o. 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    Patient presented to his orthopedic surgeon with complains of worsening back pain over the last 2 weeks.  He underwent lumbar spine surgery with fusion earlier this year.  Patient was sent to the emergency department by the orthopedic surgeon.  MRI was done which raise concern for osteomyelitis at L3-L4 and L2-L3.  Patient was hospitalized for further management.   Reason for Visit: Lumbar discitis  Consultants: Orthopedics.  Infectious disease.  Interventional radiology  Procedures: L2-3 disc aspiration by interventional radiology 10/18  Antibiotics: Vancomycin and Zosyn was given at the time of admission.  Discontinued by ID.  Changed over to vancomycin and ceftriaxone  Subjective/Interval History: Patient denies any abdominal pain.  Continues to have significant back pain.  Currently he says that the pain is 8 out of 10 in intensity.  Denies any weakness in his legs.  No family at bedside today.    ROS: Denies any chest pain or shortness of breath  Objective:  Vital Signs  Vitals:   06/15/18 0452 06/15/18 0915 06/15/18 2038 06/16/18 0514  BP: (!) 161/63 (!) 161/80 139/66 130/71  Pulse: 69 64 64 69  Resp: 19 18 19 18   Temp: 98.2 F (36.8 C) 98.3 F (36.8 C) 98.6 F (37 C) (!) 97.5 F (36.4 C)  TempSrc:  Oral Oral Oral  SpO2: 97% 96% 93% 94%  Weight:   93.2 kg     Intake/Output Summary (Last 24 hours) at 06/16/2018 0944 Last data filed at 06/16/2018 0718 Gross per 24 hour  Intake 2412.88 ml  Output 1500 ml  Net 912.88 ml   Filed  Weights   06/12/18 2041 06/13/18 2029 06/15/18 2038  Weight: 93 kg 93 kg 93.2 kg    General appearance: He is awake alert.  Cooperative.  In no distress. Resp: Normal effort at rest.  Clear to auscultation bilaterally. Cardio: S1-S2 is normal regular.  No S3-S4.  No rubs murmurs or bruit GI: Abdomen soft.  Nontender nondistended.  Bowel sounds are present normal.  No masses organomegaly Back does not reveal any deformity. Extremities: No edema Neurologic: Awake alert.  Oriented x3.  No focal neurological deficits appreciated.  Lab Results:  Data Reviewed: I have personally reviewed following labs and imaging studies  CBC: Recent Labs  Lab 06/12/18 1308 06/13/18 0535 06/14/18 0547 06/16/18 0415  WBC 8.6 7.2 8.2 8.4  NEUTROABS 6.5  --   --   --   HGB 12.9* 12.1* 11.3* 10.9*  HCT 41.1 37.9* 35.1* 33.8*  MCV 94.7 95.7 96.2 95.5  PLT 324 308 278 294    Basic Metabolic Panel: Recent Labs  Lab 06/12/18 1308 06/13/18 0535 06/14/18 0547 06/16/18 0415  NA 137 137 137 137  K 3.8 4.3 3.9 3.7  CL 102 107 104 103  CO2 27 23 22 29   GLUCOSE 145* 119* 124* 133*  BUN 11 14 11 9   CREATININE 1.03 1.04 0.86 0.78  CALCIUM 9.5 8.7* 8.9 8.9    GFR: Estimated Creatinine Clearance: 84.2 mL/min (by C-G formula based on SCr of 0.78 mg/dL).  Liver Function Tests: Recent Labs  Lab 06/12/18 1308  AST  20  ALT 22  ALKPHOS 171*  BILITOT 1.5*  PROT 7.3  ALBUMIN 3.4*    Recent Labs  Lab 06/12/18 1308  LIPASE 22   CBG: Recent Labs  Lab 06/15/18 1147 06/15/18 1524 06/15/18 1651 06/16/18 0206 06/16/18 0726  GLUCAP 193* 136* 123* 122* 115*     Recent Results (from the past 240 hour(s))  Blood culture (routine x 2)     Status: None (Preliminary result)   Collection Time: 06/12/18  1:28 PM  Result Value Ref Range Status   Specimen Description BLOOD LEFT ANTECUBITAL  Final   Special Requests   Final    BOTTLES DRAWN AEROBIC AND ANAEROBIC Blood Culture adequate volume    Culture   Final    NO GROWTH 3 DAYS Performed at [facility], [address] [place], [place] [address]    Report Status PENDING  Incomplete  Blood culture (routine x 2)     Status: None (Preliminary result)   Collection Time: 06/12/18  1:29 PM  Result Value Ref Range Status   Specimen Description BLOOD RIGHT ANTECUBITAL  Final   Special Requests   Final    BOTTLES DRAWN AEROBIC AND ANAEROBIC Blood Culture adequate volume   Culture   Final    NO GROWTH 3 DAYS Performed at [facility], [address], [place], [place] [address]    Report Status PENDING  Incomplete  Aerobic/Anaerobic Culture (surgical/deep wound)     Status: None (Preliminary result)   Collection Time: 06/14/18 10:20 AM  Result Value Ref Range Status   Specimen Description BACK  Final   Special Requests DISC  Final   Gram Stain   Final    FEW WBC PRESENT,BOTH PMN AND MONONUCLEAR NO ORGANISMS SEEN    Culture   Final    NO GROWTH 1 DAY Performed at [facility], [address], [place], [place] [address]    Report Status PENDING  Incomplete      Radiology Studies: [place] Ekg Site Rite  Result Date: 06/14/2018 If Site Rite image not attached, placement could not be confirmed due to current cardiac rhythm.  Ir Lumbar Disc Aspiration W/img Guide  Result Date: 06/14/2018 INDICATION: L2-3 discitis EXAM: L2-3 DISC ASPIRATION MEDICATIONS: The patient is currently admitted to the hospital and receiving intravenous antibiotics. The antibiotics were administered within an appropriate time frame prior to the initiation of the procedure. ANESTHESIA/SEDATION: Fentanyl 100 mcg IV; Versed 2 mg IV Moderate Sedation Time:  10 minutes The patient was continuously monitored during the procedure by the interventional radiology nurse under my direct supervision. COMPLICATIONS: None immediate. PROCEDURE: Informed written consent was obtained from the patient after a thorough discussion of the procedural risks,  benefits and alternatives. All questions were addressed. Maximal Sterile Barrier Technique was utilized including caps, mask, sterile gowns, sterile gloves, sterile drape, hand hygiene and skin antiseptic. A timeout was performed prior to the initiation of the procedure. The back was prepped and draped in a sterile fashion. 1% lidocaine was utilized for local anesthesia. Under fluoroscopic guidance, an 18 gauge needle was advanced into the L2-3 disc via right posterolateral approach. Two drops gray pus was aspirated. FINDINGS: Images document needle placement in the L2-3 disc. IMPRESSION: Successful L2-3 disc aspiration yielding 2 drops great pus. Electronically Signed   By: Jolaine Click M.D.   On: 06/14/2018 13:30     Medications:  Scheduled: . acetaminophen  650 mg Oral Q6H  . aspirin EC  81  mg Oral Daily  . enoxaparin (LOVENOX) injection  40 mg Subcutaneous Q24H  . feeding supplement (ENSURE ENLIVE)  237 mL Oral BID BM  . finasteride  5 mg Oral Daily  . insulin aspart  0-9 Units Subcutaneous TID WC  . metoprolol tartrate  25 mg Oral BID  . pantoprazole  40 mg Oral Daily  . polyethylene glycol  17 g Oral Daily  . tamsulosin  0.4 mg Oral QPC supper   Continuous: . sodium chloride 10 mL/hr at 06/15/18 2056  . cefTRIAXone (ROCEPHIN)  IV Stopped (06/15/18 1446)  . vancomycin 1,000 mg (06/16/18 0158)   YQM:[id number], HYDROmorphone (DILAUDID) injection, ondansetron, oxyCODONE, simethicone, sodium chloride flush, traZODone  Assessment/Plan:    L2-L3, L3-4 discitis/osteomyelitis involving hardware Patient was admitted to the hospital and placed on vancomycin and Zosyn.  No neurological deficits were present.  Patient was seen by orthopedic surgeon.  Plan is for surgery during the course of this hospitalization.  Infectious disease was consulted.  Subsequently IR was consulted to aspirate the area of infection so that we have culture data.  Patient underwent aspiration on 10/18.  No  organisms seen on Gram stain.  No growth on culture so far.  Started back on vancomycin and ceftriaxone by infectious disease.  Continue pain medication regimen.  Bowel regimen.  He has not had a bowel movement 2 days.  Dose of laxatives will be increased.  PICC line has been placed as he will need long-term IV antibiotics.  Await further orthopedic input regarding surgery.    Acute delirium Appears to have resolved.  Occurred during the night of 10/18.  Ambien was discontinued.  Continue trazodone.    Coronary artery disease status post CABG and angioplasty Currently stable.  Continue with dual antiplatelet treatment with aspirin and Plavix.  Continue beta-blockers.  Last cardiac catheterization was done in July 2018.  Last stent placement was in November 2017.  Plavix is on hold for possible surgery.    Diabetes mellitus type 2 Apparently diet controlled at home.  CBGs are reasonably well controlled.  Constipation Likely due to narcotics.  Will adjust his laxatives.  Essential hypertension Blood pressure was elevated yesterday likely due to agitation.  Improved this morning.  Continue to monitor.  Continue beta-blocker.    History of BPH Continue tamsulosin and finasteride.  DVT Prophylaxis: Lovenox    Code Status: Full code Family Communication: Discussed with the patient.  No family at bedside today. Disposition Plan: Continue management as outlined above.  Await orthopedic input regarding surgical plans.    LOS: 4 days   Osvaldo Shipper  [facility] Pager [phone] 06/16/2018, 9:44 AM  If 7PM-7AM, please contact night-coverage at [email or web address], password [facility]

## 2018-06-17 ENCOUNTER — Inpatient Hospital Stay (HOSPITAL_COMMUNITY): Payer: Medicare HMO

## 2018-06-17 LAB — CULTURE, BLOOD (ROUTINE X 2)
Culture: NO GROWTH
Culture: NO GROWTH
SPECIAL REQUESTS: ADEQUATE
Special Requests: ADEQUATE

## 2018-06-17 LAB — VANCOMYCIN, TROUGH: VANCOMYCIN TR: 14 ug/mL — AB (ref 15–20)

## 2018-06-17 LAB — GLUCOSE, CAPILLARY
GLUCOSE-CAPILLARY: 87 mg/dL (ref 70–99)
GLUCOSE-CAPILLARY: 139 mg/dL — AB (ref 70–99)
GLUCOSE-CAPILLARY: 109 mg/dL — AB (ref 70–99)

## 2018-06-17 MED ORDER — VANCOMYCIN HCL 10 G IV SOLR
1250.0000 mg | Freq: Two times a day (BID) | INTRAVENOUS | Status: DC
  Administered 2018-06-17 – 2018-06-26 (×18): 1250 mg via INTRAVENOUS
  Filled 2018-06-17 (×21): qty 1250

## 2018-06-17 MED ORDER — LORAZEPAM 2 MG/ML IJ SOLN
1.0000 mg | Freq: Once | INTRAMUSCULAR | Status: AC
  Administered 2018-06-17: 1 mg via INTRAVENOUS
  Filled 2018-06-17: qty 1

## 2018-06-17 NOTE — Care Management Note (Addendum)
Case Management Note  Patient Details  Name: Jeffrey Nelson MRN: [id number] Date of Birth: [birth year]  Subjective/Objective:     L2-L3, L3-L4 discitis, osteomyelitis with hardware involement               Action/Plan: NCM spoke to pt, wife and oldest dtr at bedside. [facility] following for IV abx. Pt has RW and 3n1 bedside commode at home. Pt interested in IP rehab vs HH. Will continue to follow for dc needs. Pt is a possible surgery to remove hardware.   Expected Discharge Date:                  Expected Discharge Plan:  Home w Home Health Services  In-House Referral:  NA  Discharge planning Services  CM Consult  Post Acute Care Choice:  Home Health Choice offered to:  Patient  DME Arranged:  N/A DME Agency:  NA  HH Arranged:  RN, IV Antibiotics HH Agency:  [facility]  Status of Service:  In process, will continue to follow  If discussed at Long Length of Stay Meetings, dates discussed:    Additional Comments:  Elliot Cousin, RN 06/17/2018, 1:33 PM

## 2018-06-17 NOTE — Progress Notes (Signed)
Pharmacy Antibiotic Note  Jeffrey Nelson is a 83 y.o. male admitted on 06/12/2018 with osteomyelitis.  Pharmacy has been consulted for vancomycin dosing. MRI reveals discitis osteomyelitis at L3-L4 and extension of discitis osteomyelitis L2-L3. Pt recently diagnosed w/ hardware loosening and compression fracture of L3. Patient was on vancomycin and zosyn, but these were held on 10/17 pending aspirate. Ok to resume vancomycin and ceftriaxone per ID.  Vancomycin trough came back at 14 mcg/ml - drawn appropriately.  Plan:  Increase Vancomycin 1,250 mg IV q12h Continue Ceftriaxone 2 g IV daily Monitor and adjust abx per renal fx, C&S, vanc trough as needed  Weight: 205 lb 7.5 oz (93.2 kg)  Temp (24hrs), Avg:98.4 F (36.9 C), Min:98.2 F (36.8 C), Max:98.6 F (37 C)  Recent Labs  Lab 06/12/18 1308 06/13/18 0535 06/14/18 0547 06/16/18 0415 06/17/18 1401  WBC 8.6 7.2 8.2 8.4  --   CREATININE 1.03 1.04 0.86 0.78  --   VANCOTROUGH  --   --   --   --  14*    Estimated Creatinine Clearance: 84.2 mL/min (by C-G formula based on SCr of 0.78 mg/dL).    Allergies  Allergen Reactions  . Ezetimibe Other (See Comments)    Cramps Cramps  . Statins Other (See Comments)    Cramps Cramps    Antimicrobials this admission: Vancomycin 10/16>>10/17; 10/18>> Zosyn 10/16>>10/17 Ceftriaxone 10/18>>  Microbiology results: 10/16 bcx: ngtd 10/18 deep wound culture: ngtd  Thank you for allowing pharmacy to be a part of this patient's care.  Jeanella Cara, PharmD, [facility] Clinical Pharmacist Please see AMION for all Pharmacists' Contact Phone Numbers 06/17/2018, 4:25 PM \

## 2018-06-17 NOTE — Progress Notes (Signed)
[facility] PROGRESS NOTE  Jeffrey Nelson ZOX:[id number] DOB: 18-Dec-1934 DOA: 06/12/2018  PCP: Deloris Ping, MD  Brief History/Interval Summary: 83 y.o. 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    Patient presented to his orthopedic surgeon with complains of worsening back pain over the last 2 weeks.  He underwent lumbar spine surgery with fusion earlier this year.  Patient was sent to the emergency department by the orthopedic surgeon.  MRI was done which raise concern for osteomyelitis at L3-L4 and L2-L3.  Patient was hospitalized for further management.   Reason for Visit: Lumbar discitis  Consultants: Orthopedics.  Infectious disease.  Interventional radiology  Procedures: L2-3 disc aspiration by interventional radiology 10/18  Antibiotics: Vancomycin and Zosyn was given at the time of admission.  Discontinued by ID.  Changed over to vancomycin and ceftriaxone  Subjective/Interval History: Patient continues to have back pain.  Although he did say that he slept well overnight.  Denies any weakness in his legs.  His wife is at the bedside.     ROS: Denies any chest pain or shortness of breath.  Objective:  Vital Signs  Vitals:   06/16/18 1716 06/16/18 2054 06/17/18 0411 06/17/18 0755  BP: (!) 169/80 (!) 166/59 (!) 170/77 (!) 170/80  Pulse: 73 72 71 68  Resp: 18 18 19 18   Temp:  98.3 F (36.8 C) 98.6 F (37 C) 98.2 F (36.8 C)  TempSrc:  Oral  Oral  SpO2: 97% 98% 95% 96%  Weight:        Intake/Output Summary (Last 24 hours) at 06/17/2018 0943 Last data filed at 06/17/2018 0847 Gross per 24 hour  Intake 866 ml  Output 1175 ml  Net -309 ml   Filed Weights   06/12/18 2041 06/13/18 2029 06/15/18 2038  Weight: 93 kg 93 kg  93.2 kg    General appearance: Awake alert.  In no distress. Resp: Normal effort at rest.  Clear to auscultation bilaterally. Cardio: S1-S2 is normal regular.  No S3-S4.  No rubs murmurs or bruit GI: Abdomen is soft.  Nontender nondistended.  Bowel sounds are present normal.  No masses or organomegaly Extremities: No edema Neurologic: Alert and oriented x3.  No focal neurological deficits.  Lab Results:  Data Reviewed: I have personally reviewed following labs and imaging studies  CBC: Recent Labs  Lab 06/12/18 1308 06/13/18 0535 06/14/18 0547 06/16/18 0415  WBC 8.6 7.2 8.2 8.4  NEUTROABS 6.5  --   --   --   HGB 12.9* 12.1* 11.3* 10.9*  HCT 41.1 37.9* 35.1* 33.8*  MCV 94.7 95.7 96.2 95.5  PLT 324 308 278 294    Basic Metabolic Panel: Recent Labs  Lab 06/12/18 1308 06/13/18 0535 06/14/18 0547 06/16/18 0415  NA 137 137 137 137  K 3.8 4.3 3.9 3.7  CL 102 107 104 103  CO2 27 23 22 29   GLUCOSE 145* 119* 124* 133*  BUN 11 14 11 9   CREATININE 1.03 1.04 0.86 0.78  CALCIUM 9.5 8.7* 8.9 8.9    GFR: Estimated Creatinine Clearance: 84.2 mL/min (by C-G formula based on SCr of 0.78 mg/dL).  Liver Function Tests: Recent Labs  Lab 06/12/18 1308  AST 20  ALT 22  ALKPHOS 171*  BILITOT 1.5*  PROT 7.3  ALBUMIN 3.4*    Recent Labs  Lab 06/12/18 1308  LIPASE 22   CBG: Recent Labs  Lab 06/16/18 0206 06/16/18 0726 06/16/18 1714 06/16/18 2054 06/17/18 0754  GLUCAP 122* 115* 126* 157* 87     Recent Results (from the past 240 hour(s))  Blood culture (routine x 2)     Status: None   Collection Time: 06/12/18  1:28 PM  Result Value Ref Range Status   Specimen Description BLOOD LEFT ANTECUBITAL  Final   Special Requests   Final    BOTTLES DRAWN AEROBIC AND ANAEROBIC Blood Culture adequate volume   Culture   Final    NO GROWTH 5 DAYS Performed at [facility], [address], [place], [place] [address]    Report Status 06/17/2018 FINAL  Final  Blood  culture (routine x 2)     Status: None   Collection Time: 06/12/18  1:29 PM  Result Value Ref Range Status   Specimen Description BLOOD RIGHT ANTECUBITAL  Final   Special Requests   Final    BOTTLES DRAWN AEROBIC AND ANAEROBIC Blood Culture adequate volume   Culture   Final    NO GROWTH 5 DAYS Performed at [facility], [address] [place], [place] [address]    Report Status 06/17/2018 FINAL  Final  Aerobic/Anaerobic Culture (surgical/deep wound)     Status: None (Preliminary result)   Collection Time: 06/14/18 10:20 AM  Result Value Ref Range Status   Specimen Description BACK  Final   Special Requests DISC  Final   Gram Stain   Final    FEW WBC PRESENT,BOTH PMN AND MONONUCLEAR NO ORGANISMS SEEN    Culture   Final    NO GROWTH 2 DAYS NO ANAEROBES ISOLATED; CULTURE IN PROGRESS FOR 5 DAYS Performed at [facility], [address] [place], [place] [address]    Report Status PENDING  Incomplete      Radiology Studies: No results found.   Medications:  Scheduled: . acetaminophen  650 mg Oral Q6H  . aspirin EC  81 mg Oral Daily  . enoxaparin (LOVENOX) injection  40 mg Subcutaneous Q24H  . feeding supplement (ENSURE ENLIVE)  237 mL Oral BID BM  . finasteride  5 mg Oral Daily  . insulin aspart  0-9 Units Subcutaneous TID WC  . metoprolol tartrate  25 mg Oral BID  . pantoprazole  40 mg Oral Daily  . polyethylene glycol  17 g Oral BID  . senna  1 tablet Oral BID  . tamsulosin  0.4 mg Oral QPC supper   Continuous: . sodium chloride 10 mL/hr at 06/15/18 2056  . cefTRIAXone (ROCEPHIN)  IV 2 g (06/16/18 1530)  . vancomycin 1,000 mg (06/17/18 0215)   ZHY:[id number], HYDROmorphone (DILAUDID) injection, ondansetron, oxyCODONE, simethicone, sodium chloride flush, sodium phosphate, traZODone  Assessment/Plan:    L2-L3, L3-4 discitis/osteomyelitis involving hardware Patient was admitted to the hospital and placed on vancomycin and Zosyn.  No neurological  deficits were present.  Patient was seen by orthopedic surgeon.  Infectious disease was consulted.  Patient underwent disc aspiration by interventional radiology on 10/18.  Cultures are negative so far.  Started back on vancomycin and ceftriaxone by infectious disease.  Continue pain medications.  Await further input from orthopedics regarding timing of surgery.  PICC line has been placed.  Bowel regimen.  He did have bowel movement yesterday.     Acute delirium Appears to have resolved.  Occurred during the night of 10/18.  Ambien was discontinued.  Continue  trazodone.    Coronary artery disease status post CABG and angioplasty Continue beta-blockers.  Last cardiac catheterization was done in July 2018.  Last stent placement was in November 2017.  Plavix is on hold for possible surgery.  Cardiac status is stable.  Diabetes mellitus type 2 Apparently diet controlled at home.  CBGs are reasonably well controlled.  Constipation Likely due to narcotics.  Will adjust his laxatives.  Essential hypertension Blood pressure is noted to be elevated this morning.  Continue beta-blocker.  Continue to trend over the next 24 hours.  May need to add additional agents if blood pressure remains elevated and poorly controlled.     History of BPH Continue tamsulosin and finasteride.  DVT Prophylaxis: Lovenox    Code Status: Full code Family Communication: Gust with the patient and his wife. Disposition Plan: IV antibiotics being continued.  Await orthopedic input regarding surgery.    LOS: 5 days   Osvaldo Shipper  [facility] Pager [phone] 06/17/2018, 9:43 AM  If 7PM-7AM, please contact night-coverage at [email or web address], password [facility]

## 2018-06-17 NOTE — Care Management Important Message (Signed)
Important Message  Patient Details  Name: Jeffrey Nelson MRN: [id number] Date of Birth: 1935-02-22   Medicare Important Message Given:  Yes    Dorena Bodo 06/17/2018, 3:21 PM

## 2018-06-18 DIAGNOSIS — S32030A Wedge compression fracture of third lumbar vertebra, initial encounter for closed fracture: Secondary | ICD-10-CM | POA: Insufficient documentation

## 2018-06-18 DIAGNOSIS — M8589 Other specified disorders of bone density and structure, multiple sites: Secondary | ICD-10-CM | POA: Insufficient documentation

## 2018-06-18 LAB — GLUCOSE, CAPILLARY
GLUCOSE-CAPILLARY: 116 mg/dL — AB (ref 70–99)
GLUCOSE-CAPILLARY: 154 mg/dL — AB (ref 70–99)
Glucose-Capillary: 150 mg/dL — ABNORMAL HIGH (ref 70–99)
Glucose-Capillary: 145 mg/dL — ABNORMAL HIGH (ref 70–99)

## 2018-06-18 LAB — BASIC METABOLIC PANEL
Anion gap: 7 (ref 5–15)
BUN: 7 mg/dL — AB (ref 8–23)
CALCIUM: 8.9 mg/dL (ref 8.9–10.3)
CO2: 29 mmol/L (ref 22–32)
CREATININE: 0.76 mg/dL (ref 0.61–1.24)
Chloride: 102 mmol/L (ref 98–111)
GFR calc non Af Amer: >60 mL/min (ref >60)
Glucose, Bld: 113 mg/dL — ABNORMAL HIGH (ref 70–99)
Potassium: 3.8 mmol/L (ref 3.5–5.1)
SODIUM: 138 mmol/L (ref 135–145)

## 2018-06-18 LAB — CBC
HCT: 35.8 % — ABNORMAL LOW (ref 39.0–52.0)
Hemoglobin: 11.4 g/dL — ABNORMAL LOW (ref 13.0–17.0)
MCH: 30.4 pg (ref 26.0–34.0)
MCHC: 31.8 g/dL (ref 30.0–36.0)
MCV: 95.5 fL (ref 80.0–100.0)
PLATELETS: 369 10*3/uL (ref 150–400)
RBC: 3.75 MIL/uL — ABNORMAL LOW (ref 4.22–5.81)
RDW: 13.7 % (ref 11.5–15.5)
WBC: 7.8 10*3/uL (ref 4.0–10.5)
nRBC: 0 % (ref 0.0–0.2)

## 2018-06-18 NOTE — Progress Notes (Addendum)
Patient ID: Jeffrey Nelson, male   DOB: 03/17/1935, 84 y.o.   MRN: [id number] 83 year old male with history of multiple lumbar decompressions left L3-4 with recurrent collapse of the left L3 neuroforamen eventually underwent L3-4 and L4-5 interbody fusions with cages and posterior pedicle screws and rods. He had compression deformity of L3 at his initial post op visit with bone density of femurs decreased -1.41 He was started on bone building agents. Preop MRI 11/2017 was showing extensive edema at the L3-4 disc. Intraoperative appearance of the disc did not show purulence. EXAM Motor in both legs is normal. He has pain with hip flexion but on demonstable weakness. SLR is negative.  Most recent MRI of th lumbar spine yesterday shows Disc changes L2-3 and L3-4, para spinal muscle edema Changes bilateral L3-4 and L2-3. Mild spinal stenosis findings L2-3 due to disc bulge and posterior facet and ligamentous hypertrophy.  No spinal abscess or fluid collection. DX: vertebral osteomyelitis with discitis L2-3 and L3-4. Loosening of hardware L3-L5 No abscess. Compression Fracture L3 probably secondary to osteomyelitis, Pedicle  Screws have cut into the disc space at L2-3 with the compression fracture L3.  Plan: The hardware is loose and not providing fixation of  The spine in the face of infection and discitis now is apparent at the L2-3 level probable due to end plate failure and adjacent direct inoculation from the L3-4 level.  If the cages are removed then collapse of the discs will recur and he will likely see recurrence of left lumbar radiculopathy. Currently much of his pain is mechanical And may be more hardware loosening and motion causing pain. Compression fracture alone does not necessitate surgical stablization. Long segment fusion and stablilization Bridging the area of infection is a consideration but with  Considerable risk. I plan to remove the pedicle screws and rods and perform biopsy of  the L3 vertebra. Hopefully improving the bioavailability of the antibiotics to the local infection. If collapse is worsening then may need to reconsider extending the fusion and if at the time of surgery there is instability then extension of the fusion. I will try to schedule of Friday.

## 2018-06-18 NOTE — Progress Notes (Signed)
[facility] PROGRESS NOTE  Jeffrey Nelson ZOX:[id number] DOB: 05/02/1935 DOA: 06/12/2018  PCP: Deloris Ping, MD  Brief History/Interval Summary: 83 y.o. 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    Patient presented to his orthopedic surgeon with complains of worsening back pain over the last 2 weeks.  He underwent lumbar spine surgery with fusion earlier this year.  Patient was sent to the emergency department by the orthopedic surgeon.  MRI was done which raise concern for osteomyelitis at L3-L4 and L2-L3.  Patient was hospitalized for further management.  Orthopedics and infectious disease consulted.  Reason for Visit: Lumbar discitis  Consultants: Orthopedics.  Infectious disease.  Interventional radiology  Procedures: L2-3 disc aspiration by interventional radiology 10/18  Antibiotics: Vancomycin and Zosyn was given at the time of admission.  Discontinued by ID.  Started on vancomycin and ceftriaxone after disc aspiration  Subjective/Interval History: Jeffrey Nelson states that he slept well overnight.  Had a very good bowel movement this morning.  Back pain is 6 out of 10 in intensity.  No new complaints.  Wife is at the bedside.     ROS: Denies any chest pain or shortness of breath  Objective:  Vital Signs  Vitals:   06/17/18 1706 06/17/18 2147 06/18/18 0540 06/18/18 0747  BP: (!) 154/72 (!) 132/54 135/89 (!) 144/66  Pulse: 68 76 69 61  Resp: 18 18 18 18   Temp: 98.2 F (36.8 C) 98.6 F (37 C) 98.6 F (37 C) 98.2 F (36.8 C)  TempSrc: Oral Oral Oral Oral  SpO2: 97% 97% 97% 96%  Weight:  93.2 kg      Intake/Output Summary (Last 24 hours) at 06/18/2018 1056 Last data filed at 06/18/2018 0900 Gross per 24 hour  Intake 938.36 ml    Output 301 ml  Net 637.36 ml   Filed Weights   06/13/18 2029 06/15/18 2038 06/17/18 2147  Weight: 93 kg 93.2 kg 93.2 kg    General appearance: Awake alert.  In no distress Resp: Clear to auscultation bilaterally.  No wheezing rales or rhonchi Cardio: S2 is normal regular.  No S3-S4.  No rubs murmurs or bruit GI: Abdomen is soft.  Nontender nondistended.  Bowel sounds are present normal.  No masses organomegaly Extremities: No edema Neurologic: No focal neurological deficits  Lab Results:  Data Reviewed: I have personally reviewed following labs and imaging studies  CBC: Recent Labs  Lab 06/12/18 1308 06/13/18 0535 06/14/18 0547 06/16/18 0415 06/18/18 0326  WBC 8.6 7.2 8.2 8.4 7.8  NEUTROABS 6.5  --   --   --   --   HGB 12.9* 12.1* 11.3* 10.9* 11.4*  HCT 41.1 37.9* 35.1* 33.8* 35.8*  MCV 94.7 95.7 96.2 95.5 95.5  PLT 324 308 278 294 369    Basic Metabolic Panel: Recent Labs  Lab 06/12/18 1308 06/13/18 0535 06/14/18 0547 06/16/18 0415 06/18/18 0326  NA 137 137 137 137 138  K 3.8 4.3 3.9 3.7 3.8  CL 102 107 104 103 102  CO2 27 23 22 29 29   GLUCOSE 145* 119* 124* 133* 113*  BUN 11 14 11 9  7*  CREATININE 1.03 1.04 0.86 0.78 0.76  CALCIUM 9.5 8.7* 8.9 8.9 8.9    GFR: Estimated Creatinine Clearance: 84.2 mL/min (by C-G formula based on SCr of 0.76 mg/dL).  Liver Function Tests: Recent Labs  Lab 06/12/18 1308  AST 20  ALT 22  ALKPHOS 171*  BILITOT 1.5*  PROT 7.3  ALBUMIN 3.4*    Recent Labs  Lab 06/12/18 1308  LIPASE 22   CBG: Recent Labs  Lab 06/16/18 2054 06/17/18 0754 06/17/18 1142 06/17/18 1704 06/18/18 0748  GLUCAP 157* 87 139* 109* 116*     Recent Results (from the past 240 hour(s))  Blood culture (routine x 2)     Status: None   Collection Time: 06/12/18  1:28 PM  Result Value Ref Range Status   Specimen Description BLOOD LEFT ANTECUBITAL  Final   Special Requests   Final    BOTTLES DRAWN AEROBIC AND ANAEROBIC Blood Culture  adequate volume   Culture   Final    NO GROWTH 5 DAYS Performed at [facility], [address], [place], [place] [address]    Report Status 06/17/2018 FINAL  Final  Blood culture (routine x 2)     Status: None   Collection Time: 06/12/18  1:29 PM  Result Value Ref Range Status   Specimen Description BLOOD RIGHT ANTECUBITAL  Final   Special Requests   Final    BOTTLES DRAWN AEROBIC AND ANAEROBIC Blood Culture adequate volume   Culture   Final    NO GROWTH 5 DAYS Performed at [facility], [address] [place], [place] [address]    Report Status 06/17/2018 FINAL  Final  Aerobic/Anaerobic Culture (surgical/deep wound)     Status: None (Preliminary result)   Collection Time: 06/14/18 10:20 AM  Result Value Ref Range Status   Specimen Description BACK  Final   Special Requests DISC  Final   Gram Stain   Final    FEW WBC PRESENT,BOTH PMN AND MONONUCLEAR NO ORGANISMS SEEN    Culture   Final    NO GROWTH 3 DAYS NO ANAEROBES ISOLATED; CULTURE IN PROGRESS FOR 5 DAYS Performed at [facility], [address], [place], [place] [address]    Report Status PENDING  Incomplete      Radiology Studies: Mr Lumbar Spine Wo Contrast  Result Date: 06/17/2018 CLINICAL DATA:  Discitis osteomyelitis. History of lumbar spine surgery with hardware failure and compression fracture. EXAM: MRI LUMBAR SPINE WITHOUT CONTRAST TECHNIQUE: Multiplanar, multisequence MR imaging of the lumbar spine was performed. No intravenous contrast was administered. COMPARISON:  MRI of the lumbar spine June 12, 2018 and CT abdomen and pelvis June 12, 2018 FINDINGS: SEGMENTATION: For the purposes of this report, the last well-formed intervertebral disc is reported as L5-S1. ALIGNMENT: Maintained lumbar lordosis. No malalignment. VERTEBRAE:Status post L3 through S1 PLIF. Redemonstration of moderate L3 fracture with approximately 50% height loss, the pedicle screws are migrated to the superior cortex,  unchanged. Pedicle screw of L4 may be extending into the disc space. L3-4 subsidence and abnormal signal within the disc space at L2-3, L3-4. Mild old T12 compression fracture. CONUS MEDULLARIS AND CAUDA EQUINA: Conus medullaris terminates at T12-L1 and demonstrates normal morphology and signal characteristics. Cauda equina is normal. PARASPINAL AND OTHER SOFT TISSUES: LEFT greater than RIGHT iliopsoas myositis without focal fluid collection. Paraspinal denervation at below the level surgical intervention. DISC LEVELS: T12-L1: No axial sequences. Stable small broad-based disc bulge without canal stenosis. Mild neural foraminal narrowing. L1-2: Small broad-based disc bulge asymmetric to the RIGHT. No canal stenosis. Mild RIGHT neural foraminal narrowing. L2-3: Disc bulge. No canal stenosis. Moderate to severe RIGHT greater LEFT neural foraminal narrowing.  L3-4: PLIF and posterior decompression without canal stenosis. Soft tissue effacing the neural foramen. L4-5: PLIF. Posterior decompression. No canal stenosis. Abnormal soft tissue LEFT neural foramen. L5-S1: Transitional anatomy, sacralized L5 vertebral body. No disc bulge, canal stenosis or neural foraminal narrowing. IMPRESSION: 1. Similar appearance of L2-3 and L3-4 discitis osteomyelitis. Associated iliopsoas myositis. 2. Status post L3 through L5 PLIF and posterior decompression with hardware failure. Moderate similar L2 pathologic fracture. 3. No canal stenosis. 4. Neural foraminal narrowing T12-L1 through L4-5; soft tissue effacing the neural foramen L2-3 through L4-5 could reflect disc material or purulent material. Electronically Signed   By: Awilda Metro M.D.   On: 06/17/2018 21:39     Medications:  Scheduled: . acetaminophen  650 mg Oral Q6H  . aspirin EC  81 mg Oral Daily  . enoxaparin (LOVENOX) injection  40 mg Subcutaneous Q24H  . feeding supplement (ENSURE ENLIVE)  237 mL Oral BID BM  . finasteride  5 mg Oral Daily  . insulin aspart   0-9 Units Subcutaneous TID WC  . metoprolol tartrate  25 mg Oral BID  . pantoprazole  40 mg Oral Daily  . polyethylene glycol  17 g Oral BID  . senna  1 tablet Oral BID  . tamsulosin  0.4 mg Oral QPC supper   Continuous: . sodium chloride 10 mL/hr at 06/18/18 0547  . cefTRIAXone (ROCEPHIN)  IV 2 g (06/17/18 1240)  . vancomycin 1,250 mg (06/18/18 0549)   ZOX:[id number], HYDROmorphone (DILAUDID) injection, ondansetron, oxyCODONE, simethicone, sodium chloride flush, sodium phosphate, traZODone  Assessment/Plan:    L2-L3, L3-4 discitis/osteomyelitis involving hardware Patient was admitted to the hospital. Patient was seen by orthopedic surgeon.  Infectious disease was consulted.  Patient underwent disc aspiration by interventional radiology on 10/18.  Cultures are negative so far.  Patient currently on vancomycin and ceftriaxone.  Patient does not have any neurological deficits.  Continue current pain medications.  Bowel regimen.  He did have a bowel movement this morning.  PICC line was placed.  It looks like another MRI was done last night.  Findings as above.  Await further orthopedic input (Dr. Otelia Sergeant).      Acute delirium Appears to have resolved.  Occurred during the night of 10/18.  Ambien was discontinued.  He is tolerating trazodone without any problems.  Coronary artery disease status post CABG and angioplasty Continue beta-blockers.  Last cardiac catheterization was done in July 2018.  Last stent placement was in November 2017.  Plavix is on hold for possible surgery.  Cardiac status is stable.  Diabetes mellitus type 2 Apparently diet controlled at home.  CBGs are reasonably well controlled.  Constipation Likely due to narcotics.  His laxative dose was adjusted.  Patient did have bowel movement.  He feels better.  Essential hypertension Elevation of blood pressure most likely due to pain issues.  Continue beta-blocker.  Control pain.      History of BPH Continue  tamsulosin and finasteride.  DVT Prophylaxis: Lovenox    Code Status: Full code Family Communication: Discussed with the patient and his wife Disposition Plan: He is on IV antibiotics per ID.  Await further orthopedic input.    LOS: 6 days   Osvaldo Shipper  [facility] Pager [phone] 06/18/2018, 10:56 AM  If 7PM-7AM, please contact night-coverage at [email or web address], password [facility]

## 2018-06-18 NOTE — Progress Notes (Signed)
[facility]    Date of Admission:  06/12/2018   Total days of antibiotics 7        Day 7 ceftriaxone/vanco  ID: Jeffrey Nelson is a 83 y.o. male with medical history significant ofcompression fracture of L3 lumbar vertebra with nonunion, status post lumbar spinal fusion (April, 2019), with lumbar HW infection Active Problems:   Spinal stenosis, lumbar region, with neurogenic claudication   Stenosis of cervical spine with myelopathy (HCC)   Diabetes mellitus type 2, uncomplicated (HCC)   Osteomyelitis (HCC)   Essential hypertension   BPH (benign prostatic hyperplasia)    Subjective: Afebrile, but still has some back discomfort, more so when ambulating/standing.   ROS: Denies any diarrhea, or constipation. No fever, chills nightsweats, no cough  Medications:  . acetaminophen  650 mg Oral Q6H  . aspirin EC  81 mg Oral Daily  . enoxaparin (LOVENOX) injection  40 mg Subcutaneous Q24H  . feeding supplement (ENSURE ENLIVE)  237 mL Oral BID BM  . finasteride  5 mg Oral Daily  . insulin aspart  0-9 Units Subcutaneous TID WC  . metoprolol tartrate  25 mg Oral BID  . pantoprazole  40 mg Oral Daily  . polyethylene glycol  17 g Oral BID  . senna  1 tablet Oral BID  . tamsulosin  0.4 mg Oral QPC supper    Objective: Vital signs in last 24 hours: Temp:  [98.2 F (36.8 C)-98.7 F (37.1 C)] 98.7 F (37.1 C) (10/22 1635) Pulse Rate:  [61-76] 64 (10/22 1635) Resp:  [18] 18 (10/22 1635) BP: (132-145)/(54-89) 145/69 (10/22 1635) SpO2:  [96 %-97 %] 97 % (10/22 1635) Weight:  [93.2 kg] 93.2 kg (10/21 2147) Physical Exam  Constitutional: He is oriented to person, place, and time. He appears well-developed and well-nourished. No distress.  HENT:  Mouth/Throat: Oropharynx is clear and moist. No oropharyngeal exudate.  Cardiovascular: Normal rate, regular rhythm and normal heart sounds. Exam reveals no gallop and no friction rub.  No murmur heard.    Pulmonary/Chest: Effort normal and breath sounds normal. No respiratory distress. He has no wheezes.  Abdominal: Soft. Bowel sounds are normal. He exhibits no distension. There is no tenderness.  Lymphadenopathy:  He has no cervical adenopathy.  Neurological: He is alert and oriented to person, place, and time.  Skin: Skin is warm and dry. No rash noted. No erythema.  Psychiatric: He has a normal mood and affect. His behavior is normal.     Lab Results Recent Labs    06/16/18 0415 06/18/18 0326  WBC 8.4 7.8  HGB 10.9* 11.4*  HCT 33.8* 35.8*  NA 137 138  K 3.7 3.8  CL 103 102  CO2 29 29  BUN 9 7*  CREATININE 0.78 0.76   Lab Results  Component Value Date   ESRSEDRATE 44 (H) 06/12/2018    Microbiology: Reviewed 10/18 disc cx ngtd 10/16 blood cx ngtd  Studies/Results: Mr Lumbar Spine Wo Contrast  Result Date: 06/17/2018 CLINICAL DATA:  Discitis osteomyelitis. History of lumbar spine surgery with hardware failure and compression fracture. EXAM: MRI LUMBAR SPINE WITHOUT CONTRAST TECHNIQUE: Multiplanar, multisequence MR imaging of the lumbar spine was performed. No intravenous contrast was administered. COMPARISON:  MRI of the lumbar spine June 12, 2018 and CT abdomen and pelvis June 12, 2018 FINDINGS: SEGMENTATION: For the purposes of this report, the last well-formed intervertebral disc is reported as L5-S1. ALIGNMENT: Maintained lumbar lordosis. No malalignment. VERTEBRAE:Status post L3 through  S1 PLIF. Redemonstration of moderate L3 fracture with approximately 50% height loss, the pedicle screws are migrated to the superior cortex, unchanged. Pedicle screw of L4 may be extending into the disc space. L3-4 subsidence and abnormal signal within the disc space at L2-3, L3-4. Mild old T12 compression fracture. CONUS MEDULLARIS AND CAUDA EQUINA: Conus medullaris terminates at T12-L1 and demonstrates normal morphology and signal characteristics. Cauda equina is normal. PARASPINAL  AND OTHER SOFT TISSUES: LEFT greater than RIGHT iliopsoas myositis without focal fluid collection. Paraspinal denervation at below the level surgical intervention. DISC LEVELS: T12-L1: No axial sequences. Stable small broad-based disc bulge without canal stenosis. Mild neural foraminal narrowing. L1-2: Small broad-based disc bulge asymmetric to the RIGHT. No canal stenosis. Mild RIGHT neural foraminal narrowing. L2-3: Disc bulge. No canal stenosis. Moderate to severe RIGHT greater LEFT neural foraminal narrowing. L3-4: PLIF and posterior decompression without canal stenosis. Soft tissue effacing the neural foramen. L4-5: PLIF. Posterior decompression. No canal stenosis. Abnormal soft tissue LEFT neural foramen. L5-S1: Transitional anatomy, sacralized L5 vertebral body. No disc bulge, canal stenosis or neural foraminal narrowing. IMPRESSION: 1. Similar appearance of L2-3 and L3-4 discitis osteomyelitis. Associated iliopsoas myositis. 2. Status post L3 through L5 PLIF and posterior decompression with hardware failure. Moderate similar L2 pathologic fracture. 3. No canal stenosis. 4. Neural foraminal narrowing T12-L1 through L4-5; soft tissue effacing the neural foramen L2-3 through L4-5 could reflect disc material or purulent material. Electronically Signed   By: Awilda Metro M.D.   On: 06/17/2018 21:39   Assessment/Plan: Probable infection discitis/osteo associated with lumbar fusion HW = continue on ceftriaxone plus vancomycin for now. I agree with plan that dr Otelia Sergeant has set forth with removal of posterior hardware. Will try to get further samples but maybe sterile given how many days of therapy patient has received. Patient may have improvement in pain with loosened hardware removed.  Plan for 6 wk of iv abtx. Will watch kidney function closely  [facility] Cell: [phone] Pager: [phone]  06/18/2018, 5:58 PM

## 2018-06-18 NOTE — Progress Notes (Deleted)
Office Visit Note  Patient: Jeffrey Nelson             Date of Birth: Dec 06, 1934           MRN: [id number]             PCP: Wayland Salinas, MD Referring: Wayland Salinas, * Visit Date: 06/25/2018 Occupation: '@GUAROCC' @  Subjective:  No chief complaint on file.   History of Present Illness: Jeffrey Nelson is a 83 y.o. male ***   Activities of Daily Living:  Patient reports morning stiffness for *** {minute/hour:19697}.   Patient {ACTIONS;DENIES/REPORTS:21021675::"Denies"} nocturnal pain.  Difficulty dressing/grooming: {ACTIONS;DENIES/REPORTS:21021675::"Denies"} Difficulty climbing stairs: {ACTIONS;DENIES/REPORTS:21021675::"Denies"} Difficulty getting out of chair: {ACTIONS;DENIES/REPORTS:21021675::"Denies"} Difficulty using hands for taps, buttons, cutlery, and/or writing: {ACTIONS;DENIES/REPORTS:21021675::"Denies"}  No Rheumatology ROS completed.   PMFS History:  Patient Active Problem List   Diagnosis Date Noted  . Osteomyelitis ([place]) 06/12/2018  . Essential hypertension 06/12/2018  . BPH (benign prostatic hyperplasia) 06/12/2018  . Lumbosacral radiculopathy at L3   . Tachycardia 12/14/2017  . Leukocytosis 12/14/2017  . Anemia due to blood loss 12/13/2017    Class: Chronic  . Syncope 12/03/2017  . Dehydration 12/03/2017  . Chronic back pain 12/03/2017  . Hypotension 12/03/2017  . Subclavian artery stenosis, right ([place]) 12/03/2017  . Status post lumbar laminectomy 11/12/2017  . Postoperative pain after spinal surgery 11/11/2017  . Weakness of right lower extremity   . Sciatica associated with disorder of lumbosacral spine   . Surgery, elective   . Diabetes mellitus type 2, uncomplicated ([place]) 40/98/1191    Class: Chronic  . Lumbar disc herniation with radiculopathy 10/13/2017    Class: Acute  . Radiculopathy, lumbar region 10/09/2017  . Pain in right hip 10/09/2017    Class: Acute  . Right knee sprain 10/09/2017    Class: Acute  . Hip pain,  acute, right 10/09/2017  . Bradycardia 10/09/2017  . Spondylolisthesis, lumbar region 10/05/2017  . Knee contusion 07/24/2016  . Spinal stenosis in cervical region 05/14/2015    Class: Chronic  . Cubital tunnel syndrome on left 05/14/2015    Class: Chronic  . Stenosis of cervical spine with myelopathy ([place]) 05/14/2015  . Spinal stenosis, lumbar region, with neurogenic claudication 01/16/2014    Class: Chronic    Past Medical History:  Diagnosis Date  . Anginal pain ([place])    occ  . Arthritis    "joints" (10/09/2017)  . Chronic lower back pain   . Complication of anesthesia    extremely claustrophobic; "have to be put down for MRI; don't sit in back seat of car;, etc."  . Coronary artery disease   . High cholesterol    "get shots twice/month" (10/09/2017)  . History of blood transfusion 2001   "related to CABG"  . History of kidney stones   . HOH (hard of hearing)    wearing hearing aids  . Hypertension   . Myocardial infarction ([place]) 2000s-06/2017 X 4  . Stroke [facility]) ~ 2015   "in my right eye; sight is coming back little by little" (10/09/2017)  . Type II diabetes mellitus ([facility])     Family History  Problem Relation Age of Onset  . CAD Brother   . Cancer Brother   . Alzheimer's disease Mother   . Healthy Daughter   . Healthy Daughter   . Diabetes Neg Hx    Past Surgical History:  Procedure Laterality Date  . ANTERIOR CERVICAL DECOMP/DISCECTOMY FUSION N/A 05/14/2015   Procedure: C3-4  ANTERIOR CERVICAL DISCECTOMY AND FUSION WITH PLATE AND SCREWS, LOCAL AND ALLOGRAFT BONE GRAFT;  Surgeon: Jessy Oto, MD;  Location: [place];  Service: Orthopedics;  Laterality: N/A;  . BACK SURGERY    . CARDIAC CATHETERIZATION     X 17 (10/09/2017)  . CATARACT EXTRACTION W/ INTRAOCULAR LENS  IMPLANT, BILATERAL Bilateral   . COLONOSCOPY    . CORONARY ANGIOPLASTY  06/2017  . CORONARY ANGIOPLASTY WITH STENT PLACEMENT     "6 stents total" (10/09/2017)  . CORONARY ARTERY BYPASS GRAFT  2001    CABG x4   . CYSTOSCOPY W/ STONE MANIPULATION    . IR LUMBAR [place] W/IMG GUIDE  06/14/2018  . JOINT REPLACEMENT    . LUMBAR LAMINECTOMY Right 10/13/2017   Procedure: MICRODISCECTOMY LUMBAR LAMINECTOMY RIGHT L3-4;  Surgeon: Jessy Oto, MD;  Location: [place];  Service: Orthopedics;  Laterality: Right;  . LUMBAR LAMINECTOMY Left 11/12/2017   Procedure: MICRODISCECTOMY L3-4 FOR RECURRENT HNP POSSIBLE FLURO;  Surgeon: Jessy Oto, MD;  Location: [place];  Service: Orthopedics;  Laterality: Left;  . LUMBAR LAMINECTOMY/DECOMPRESSION MICRODISCECTOMY N/A 01/16/2014   Procedure: Left L3-4 and L4-5 foraminotomies;  Surgeon: Jessy Oto, MD;  Location: [place];  Service: Orthopedics;  Laterality: N/A;  . NASAL FRACTURE SURGERY    . NASAL SINUS SURGERY     "several times since 1959"  . RADIOLOGY WITH ANESTHESIA N/A 10/12/2017   Procedure: MRI WITH ANESTHESIA;  Surgeon: Radiologist, Medication, MD;  Location: [place];  Service: Radiology;  Laterality: N/A;  . RADIOLOGY WITH ANESTHESIA N/A 12/05/2017   Procedure: MRI WITH ANESTHESIA;  Surgeon: Radiologist, Medication, MD;  Location: [facility];  Service: Radiology;  Laterality: N/A;  . SHOULDER OPEN ROTATOR CUFF REPAIR Bilateral   . TOTAL KNEE ARTHROPLASTY Right 2012  . TYMPANOSTOMY TUBE PLACEMENT Bilateral   . ULNAR NERVE TRANSPOSITION Left 05/14/2015   Procedure: LEFT ULNAR NERVE DECOMPRESSION AT THE ELBOW;  Surgeon: Jessy Oto, MD;  Location: [place];  Service: Orthopedics;  Laterality: Left;   Social History   Social History Narrative  . Not on file    Objective: Vital Signs: There were no vitals taken for this visit.   Physical Exam   Musculoskeletal Exam: ***  CDAI Exam: CDAI Score: Not documented Patient Global Assessment: Not documented; Provider Global Assessment: Not documented Swollen: Not documented; Tender: Not documented Joint Exam   Not documented   There is currently no information documented on the homunculus. Go to the  Rheumatology activity and complete the homunculus joint exam.  Investigation: No additional findings.  Imaging: Mr Lumbar Spine Wo Contrast  Result Date: 06/17/2018 CLINICAL DATA:  Discitis osteomyelitis. History of lumbar spine surgery with hardware failure and compression fracture. EXAM: MRI LUMBAR SPINE WITHOUT CONTRAST TECHNIQUE: Multiplanar, multisequence MR imaging of the lumbar spine was performed. No intravenous contrast was administered. COMPARISON:  MRI of the lumbar spine June 12, 2018 and CT abdomen and pelvis June 12, 2018 FINDINGS: SEGMENTATION: For the purposes of this report, the last well-formed intervertebral disc is reported as L5-S1. ALIGNMENT: Maintained lumbar lordosis. No malalignment. VERTEBRAE:Status post L3 through S1 PLIF. Redemonstration of moderate L3 fracture with approximately 50% height loss, the pedicle screws are migrated to the superior cortex, unchanged. Pedicle screw of L4 may be extending into the disc space. L3-4 subsidence and abnormal signal within the disc space at L2-3, L3-4. Mild old T12 compression fracture. CONUS MEDULLARIS AND CAUDA EQUINA: Conus medullaris terminates at T12-L1 and demonstrates normal morphology  and signal characteristics. Cauda equina is normal. PARASPINAL AND OTHER SOFT TISSUES: LEFT greater than RIGHT iliopsoas myositis without focal fluid collection. Paraspinal denervation at below the level surgical intervention. DISC LEVELS: T12-L1: No axial sequences. Stable small broad-based disc bulge without canal stenosis. Mild neural foraminal narrowing. L1-2: Small broad-based disc bulge asymmetric to the RIGHT. No canal stenosis. Mild RIGHT neural foraminal narrowing. L2-3: Disc bulge. No canal stenosis. Moderate to severe RIGHT greater LEFT neural foraminal narrowing. L3-4: PLIF and posterior decompression without canal stenosis. Soft tissue effacing the neural foramen. L4-5: PLIF. Posterior decompression. No canal stenosis. Abnormal soft  tissue LEFT neural foramen. L5-S1: Transitional anatomy, sacralized L5 vertebral body. No disc bulge, canal stenosis or neural foraminal narrowing. IMPRESSION: 1. Similar appearance of L2-3 and L3-4 discitis osteomyelitis. Associated iliopsoas myositis. 2. Status post L3 through L5 PLIF and posterior decompression with hardware failure. Moderate similar L2 pathologic fracture. 3. No canal stenosis. 4. Neural foraminal narrowing T12-L1 through L4-5; soft tissue effacing the neural foramen L2-3 through L4-5 could reflect disc material or purulent material. Electronically Signed   By: Elon Alas M.D.   On: 06/17/2018 21:39   Mr Lumbar Spine Wo Contrast  Result Date: 06/12/2018 CLINICAL DATA:  83 year old male with increased lumbar spine pain status post fusion 6 months ago. Fractures/collapse of the L3 vertebral body. EXAM: MRI LUMBAR SPINE WITHOUT CONTRAST TECHNIQUE: Multiplanar, multisequence MR imaging of the lumbar spine was performed. No intravenous contrast was administered. COMPARISON:  CT lumbar Spine [facility] 05/14/2018. Lumbar MRI 12/05/2017, and earlier. FINDINGS: Segmentation: Hypoplastic ribs assumed T12 as on the CT and April MRI numbering. Alignment: Straightening of lumbar lordosis is stable from last month. Vertebrae: Confluent marrow edema from the posterior inferior L2 body, throughout the L3 body, and into the superior and posterior L4 body. See series 7, image 9. This has progressed from the L3-L4 level endplate edema seen in April, with moderate collapse of L3 and erosion of the L3-L4 endplates since the prior MRI. Interbody implant at L3-L4. New abnormal increased T2 and STIR signal in the L2-L3 disc, with new L2 endplate and body marrow edema since April. The L1 and L5 levels appear stable and intact. Mild chronic T12 inferior endplate compression is stable. Intact visible lower thoracic levels. Intact visible sacrum and SI joints. Conus medullaris and cauda  equina: Conus extends to the T12-L1 level. No lower spinal cord or conus signal abnormality. Paraspinal and other soft tissues: Confluent left greater than right medial psoas muscle edema as seen on series 6, image 13 and series 5, images 17 (on the left) and 1 (on the right). No intramuscular fluid collection is identified. The STIR images demonstrate prominent marrow edema in the L3 transverse processes. Superimposed postoperative changes to the lower lumbar erector spinae muscles. Negative visible abdominal viscera. Disc levels: No Lower spinal stenosis above L2. L2-L3: New abnormal increased T2 and STIR signal within the disc. Progressed circumferential disc bulge with chronic mild to moderate facet and ligament flavum hypertrophy. New mild to moderate spinal stenosis. Chronic mild to moderate L2 foraminal stenosis greater on the right. L3-L4: Posterior and interbody fusion hardware here and prior decompression. No spinal stenosis. There appears to be soft tissue edema affecting the bilateral L3 neural foramina. L4-L5: Posterior and interbody fusion hardware plus decompression here. Mild residual right L4 foraminal stenosis related to facet hypertrophy. L5-S1: Stable and negative aside from mild facet hypertrophy. IMPRESSION: 1. Constellation of imaging findings since the 12/05/2017 MRI is most  compatible with prolonged Discitis Osteomyelitis at L3-L4, and extension of Discitis Osteomyelitis to L2-L3 since April. There is bilateral L3 through L5 spinal fusion hardware in place. Associated partial collapse of L3 with L3 and L4 endplate erosion. Abnormal L2-L3 disc and L2 inferior endplates now. 2. Bilateral psoas muscle edema/inflammation. No intramuscular or epidural abscess identified. 3. Multifactorial mild to moderate spinal stenosis at L2-L3 is new since April. Electronically Signed   By: Genevie Ann M.D.   On: 06/12/2018 16:14   Nm Bone Scan 3 Phase  Result Date: 06/06/2018 CLINICAL DATA:  SI joint pain,  question spondyloarthropathy, painful loosening of hardware L3, LEFT sacral pain, prior L3-L5 fusion in April 2019, assess for sacral insufficiency fracture EXAM: NUCLEAR MEDICINE 3-PHASE BONE SCAN TECHNIQUE: Radionuclide angiographic images, immediate static blood pool images, and 3-hour delayed static images were obtained of the lumbar spine, pelvis and hips after intravenous injection of radiopharmaceutical. RADIOPHARMACEUTICALS:  22 mCi Tc-73mMDP IV COMPARISON:  None Correlation: Lumbar spine radiographs 05/29/2018, pelvic radiograph 01/17/2018 FINDINGS: Vascular phase: Normal blood flow to the hips and pelvis Blood pool phase: Normal blood pool at the pelvis and hips. Mildly increased blood pool in the lumbar region at approximately the L3-L5 Delayed phase: Delayed images show normal tracer accumulation at the hip joints and SI joints. Normal tracer localization in the pelvis and proximal femora. No scintigraphic evidence of sacral insufficiency fracture. Diffusely increased tracer localization is seen in lumbar spine at L3-L5, predominantly at L3-L4, corresponding to the surgical levels identified on recent radiographs. These findings are consistent with the relative recent age of surgery and expected postoperative uptake. IMPRESSION: Normal scintigraphic appearance of the pelvis, SI joints, sacrum, and hip joints. Increased blood pool and delayed uptake of tracer in lower lumbar spine corresponding to prior L3-L5 fusion, with greatest uptake centered at L3-L4. Degree of uptake is consistent with preceding spine surgery. Due to postsurgical uptake, unable to exclude underlying infection. Electronically Signed   By: MLavonia DanaM.D.   On: 06/06/2018 13:44   Ct Abdomen Pelvis W Contrast  Result Date: 06/12/2018 CLINICAL DATA:  Left lower quadrant pain with diarrhea. EXAM: CT ABDOMEN AND PELVIS WITH CONTRAST TECHNIQUE: Multidetector CT imaging of the abdomen and pelvis was performed using the standard  protocol following bolus administration of intravenous contrast. CONTRAST:  100 cc Omnipaque 300 intravenous COMPARISON:  MRI of the lumbar spine from the same day FINDINGS: Lower chest:  Coronary calcification.  No acute finding Hepatobiliary: No focal liver abnormality.Single stone within the gallbladder which is full but shows no inflammatory changes. Mild prominence of the biliary tree without calcified choledocholithiasis Pancreas: Unremarkable. Spleen: Unremarkable. 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 No hydronephrosis or stone. Subcentimeter low-densities in the left kidney, too small for densitometry. Unremarkable bladder. Stomach/Bowel:  No obstruction. No appendicitis. Vascular/Lymphatic: No acute vascular abnormality. Extensive atherosclerotic calcification of the aorta. No mass or adenopathy. Reproductive:Probable central prostate nodule.  No acute finding. Other: No ascites or pneumoperitoneum. Musculoskeletal: L2-3 and L3-4 discitis osteomyelitis. There has been L3-4 PLIF. The surrounding paravertebral fat is edematous. The pedicle screws at L3 and L4 show halo in/loosening. No evident collection. There is subsequent MRI available. IMPRESSION: 1. L2-3 and L3-4 discitis osteomyelitis which involves L3-4 PLIF hardware. 2. No evidence of colitis or other inflammation to explain diarrhea. 3. Cholelithiasis. Electronically Signed   By: JMonte FantasiaM.D.   On: 06/12/2018 18:13   [place]kg Site Rite  Result Date: 06/14/2018 If [employer]  not attached, placement could not be confirmed due to current cardiac rhythm.  Ir Lumbar Disc Aspiration W/img Guide  Result Date: 06/14/2018 INDICATION: L2-3 discitis EXAM: L2-3 DISC ASPIRATION MEDICATIONS: The patient is currently admitted to the hospital and receiving intravenous antibiotics. The antibiotics were administered within an appropriate time frame prior to the initiation of the procedure. ANESTHESIA/SEDATION: Fentanyl  100 mcg IV; Versed 2 mg IV Moderate Sedation Time:  10 minutes The patient was continuously monitored during the procedure by the interventional radiology nurse under my direct supervision. COMPLICATIONS: None immediate. PROCEDURE: Informed written consent was obtained from the patient after a thorough discussion of the procedural risks, benefits and alternatives. All questions were addressed. Maximal Sterile Barrier Technique was utilized including caps, mask, sterile gowns, sterile gloves, sterile drape, hand hygiene and skin antiseptic. A timeout was performed prior to the initiation of the procedure. The back was prepped and draped in a sterile fashion. 1% lidocaine was utilized for local anesthesia. Under fluoroscopic guidance, an 18 gauge needle was advanced into the L2-3 disc via right posterolateral approach. Two drops gray pus was aspirated. FINDINGS: Images document needle placement in the L2-3 disc. IMPRESSION: Successful L2-3 disc aspiration yielding 2 drops great pus. Electronically Signed   By: Marybelle Killings M.D.   On: 06/14/2018 13:30    Recent Labs: Lab Results  Component Value Date   WBC 7.8 06/18/2018   HGB 11.4 (L) 06/18/2018   PLT 369 06/18/2018   NA 138 06/18/2018   K 3.8 06/18/2018   CL 102 06/18/2018   CO2 29 06/18/2018   GLUCOSE 113 (H) 06/18/2018   BUN 7 (L) 06/18/2018   CREATININE 0.76 06/18/2018   BILITOT 1.5 (H) 06/12/2018   ALKPHOS 171 (H) 06/12/2018   AST 20 06/12/2018   ALT 22 06/12/2018   PROT 7.3 06/12/2018   ALBUMIN 3.4 (L) 06/12/2018   CALCIUM 8.9 06/18/2018   GFRAA >60 06/18/2018  May 03, 2018 ESR 36, testosterone normal, TSH normal, phosphorus normal, intact PTH normal  Speciality Comments: No specialty comments available.  Procedures:  No procedures performed Allergies: Ezetimibe and Statins   Assessment / Plan:     Visit Diagnoses: No diagnosis found.   Orders: No orders of the defined types were placed in this encounter.  No orders of  the defined types were placed in this encounter.   Face-to-face time spent with patient was *** minutes. Greater than 50% of time was spent in counseling and coordination of care.  Follow-Up Instructions: No follow-ups on file.   Bo Merino, MD  Note - This record has been created using Editor, commissioning.  Chart creation errors have been sought, but may not always  have been located. Such creation errors do not reflect on  the standard of medical care.

## 2018-06-19 DIAGNOSIS — M4620 Osteomyelitis of vertebra, site unspecified: Secondary | ICD-10-CM | POA: Diagnosis present

## 2018-06-19 DIAGNOSIS — T84498A Other mechanical complication of other internal orthopedic devices, implants and grafts, initial encounter: Secondary | ICD-10-CM | POA: Diagnosis present

## 2018-06-19 DIAGNOSIS — M4646 Discitis, unspecified, lumbar region: Secondary | ICD-10-CM | POA: Diagnosis present

## 2018-06-19 LAB — AEROBIC/ANAEROBIC CULTURE W GRAM STAIN (SURGICAL/DEEP WOUND): Culture: NO GROWTH

## 2018-06-19 LAB — CREATININE, SERUM
Creatinine, Ser: 0.88 mg/dL (ref 0.61–1.24)
GFR calc Af Amer: >60 mL/min (ref >60)
GFR calc non Af Amer: >60 mL/min (ref >60)

## 2018-06-19 LAB — AEROBIC/ANAEROBIC CULTURE (SURGICAL/DEEP WOUND)

## 2018-06-19 LAB — GLUCOSE, CAPILLARY
GLUCOSE-CAPILLARY: 110 mg/dL — AB (ref 70–99)
Glucose-Capillary: 146 mg/dL — ABNORMAL HIGH (ref 70–99)
Glucose-Capillary: 144 mg/dL — ABNORMAL HIGH (ref 70–99)
Glucose-Capillary: 152 mg/dL — ABNORMAL HIGH (ref 70–99)

## 2018-06-19 MED ORDER — OXYCODONE HCL 5 MG PO TABS
5.0000 mg | ORAL_TABLET | ORAL | Status: DC | PRN
  Administered 2018-06-19 (×2): 10 mg via ORAL
  Administered 2018-06-20: 5 mg via ORAL
  Administered 2018-06-20 – 2018-06-23 (×7): 10 mg via ORAL
  Filled 2018-06-19 (×5): qty 2
  Filled 2018-06-19: qty 1
  Filled 2018-06-19 (×5): qty 2

## 2018-06-19 MED ORDER — ENSURE ENLIVE PO LIQD
237.0000 mL | Freq: Three times a day (TID) | ORAL | Status: DC
  Administered 2018-06-19 – 2018-06-26 (×25): 237 mL via ORAL

## 2018-06-19 NOTE — Progress Notes (Signed)
Nutrition Follow-up  DOCUMENTATION CODES:   Not applicable  INTERVENTION:   Increase Ensure Enlive po to QID, each supplement provides 350 kcal and 20 grams of protein  NUTRITION DIAGNOSIS:   Increased nutrient needs related to acute illness(discitis with osteomyelitis) as evidenced by estimated needs.  Continues  GOAL:   Patient will meet greater than or equal to 90% of their needs  Not met  MONITOR:   PO intake, Supplement acceptance, Diet advancement  REASON FOR ASSESSMENT:   Malnutrition Screening Tool    ASSESSMENT:   Jeffrey Nelson is an 83 yo male with PMH of type 2 diabetes,  BPH, HTN, CAD w/ CABG, spinal stenosis, s/p lumbar laminectomy admitted for L2-L3, L3-L4 discitis osteomyelitis.    10/18 L2-3 disc aspiration by IR, cultures negative thus far  Noted plan for removal of pedicle screws and rods with biopsy of L3 vertebra tentatively on Friday 10/25 by Orthopedic MD Pt receiving 6 weeks IV abx  Pt reports appetite is not great due to pain, pt did not eat breakfast this AM.Pt indicate he has been drinking Ensure shakes as often as he is offered them. Currently ordered BID. Plan to increase frequency discussed with pt and pt agreeable.  Pt biggest barrier to po intake at this time is poorly controlled pain. Pt reports it hurts to sit-up for any length of time. Pt currently lying flat in bed.  Pt does indicate that pain medication increased today and hopeful that this will help.  Pt reports +2 BMs yesterday after bowel regimen adjusted. Pt denies N/V.   Weight relatively stable per weight encounters  Encouraged pt to try and eat when pain is well controlled via meds. Noted this is hard to time with meal delivery. Pt informed that snacks and sandwich trays are stocked on the unit and pt can ask for these items any time of day. Encouraged pt to do so when pain controlled/mild.   Labs: reviewed Meds: dilaudid, oxycodone, miralax BID, senna BID, simethicone prn,  fleet enema prn  Diet Order:   Diet Order            Diet regular Room service appropriate? Yes; Fluid consistency: Thin  Diet effective now              EDUCATION NEEDS:   No education needs have been identified at this time  Skin:  Skin Assessment: Skin Integrity Issues: Skin Integrity Issues:: Other (Comment) Other: puncture on lower back  Last BM:  10/23  Height:   Ht Readings from Last 1 Encounters:  06/12/18 _0  (1.88 m)    Weight:   Wt Readings from Last 1 Encounters:  06/19/18 93.3 kg    Ideal Body Weight:  86.4 kg  BMI:  Body mass index is 26.41 kg/m.  Estimated Nutritional Needs:   Kcal:  2100-2300  Protein:  105-115  Fluid:  >2.0 L   Kerman Passey MS, RD, LDN, CNSC [phone] Pager  [phone] Weekend/On-Call Pager

## 2018-06-19 NOTE — Progress Notes (Signed)
[facility]                                                                              Patient Demographics  Jeffrey Nelson, is a 83 y.o. male, DOB - 30-Sep-1934, ZOX:[id number]  Admit date - 06/12/2018   Admitting Physician Mauricio Annett Gula, MD  Outpatient Primary MD for the patient is Ryter-Brown, Fritzi Mandes, MD  Outpatient specialists:   LOS - 7  days   Medical records reviewed and are as summarized below:    Chief Complaint  Patient presents with  . Back Pain       Brief summary   83 y.o.malewith medical history significant ofcompression fracture of L3 lumbar vertebra with nonunion, status post lumbar spinal fusion (April, 2019),type 2 diabetes mellitus, coronary artery disease status post bypass grafting and stenting, hypertension and dyslipidemia.   Patient presented to his orthopedic surgeon with complains of worsening back pain over the last 2 weeks.  He underwent lumbar spine surgery with fusion earlier this year.  Patient was sent to the emergency department by the orthopedic surgeon.  MRI was done which raise concern for osteomyelitis at L3-L4 and L2-L3.  Patient was hospitalized for further management.  Orthopedics and infectious disease consulted.   Assessment & Plan   Principal problem L2-L3, L3-L4 discitis/osteomyelitis involving the hardware -Seen by orthopedics and ID -Underwent disc aspiration by IR on 10/18, cultures negative so far -Patient placed on vancomycin and ceftriaxone, per ID plan for 6 weeks of IV antibiotics -PICC Nelson placed -Seen by Dr. Otelia Sergeant on 10/22, recommending OR on Friday 10/25 to remove pedicle screws and rods, biopsy of the L3 vertebra  Active Problems:  Acute on chronic pain -Patient reports 10/10 pain in the lumbar region, increased oral oxycodone to 5-10 mg every 4 hours as needed with IV Dilaudid for breakthrough pain  CAD status post CABG, angioplasty Cardiac cath in 02/2017, last stent placed in  November 2017, Plavix currently on hold for surgery on 10/25 No chest pain or shortness of breath, stable  Diabetes mellitus type 2 -CBGs currently controlled  Constipation Continue bowel regimen, likely due to narcotics  Essential hypertension Continue beta-blocker, worsening likely due to pain and anxiety  BPH Continue tamsulosin, finasteride   Code Status: Full CODE STATUS DVT Prophylaxis:  Lovenox  Family Communication: Discussed in detail with the patient, all imaging results, lab results explained to the patient   Disposition Plan: Significant pain, plan for or on 10/25  Time Spent in minutes   35 minutes  Procedures:  L2-L3 disc aspiration by IR on 10/18  Consultants:   Orthopedics Infectious disease IR  Antimicrobials:      Medications  Scheduled Meds: . acetaminophen  650 mg Oral Q6H  . aspirin EC  81 mg Oral Daily  . enoxaparin (LOVENOX) injection  40 mg Subcutaneous Q24H  . feeding supplement (ENSURE ENLIVE)  237 mL Oral TID PC & HS  . finasteride  5 mg Oral Daily  . insulin aspart  0-9 Units Subcutaneous TID WC  . metoprolol tartrate  25 mg Oral BID  . pantoprazole  40 mg Oral Daily  . polyethylene  glycol  17 g Oral BID  . senna  1 tablet Oral BID  . tamsulosin  0.4 mg Oral QPC supper   Continuous Infusions: . sodium chloride 10 mL/hr at 06/18/18 0547  . cefTRIAXone (ROCEPHIN)  IV 2 g (06/19/18 1202)  . vancomycin 1,250 mg (06/19/18 0523)   PRN Meds:.cyclobenzaprine, HYDROmorphone (DILAUDID) injection, ondansetron, oxyCODONE, simethicone, sodium chloride flush, sodium phosphate, traZODone   Antibiotics   Anti-infectives (From admission, onward)   Start     Dose/Rate Route Frequency Ordered Stop   06/17/18 1700  vancomycin (VANCOCIN) 1,250 mg in sodium chloride 0.9 % 250 mL IVPB     1,250 mg 166.7 mL/hr over 90 Minutes Intravenous Every 12 hours 06/17/18 1626     06/14/18 1315  vancomycin (VANCOCIN) IVPB 1000 mg/200 mL premix  Status:   Discontinued     1,000 mg 200 mL/hr over 60 Minutes Intravenous Every 12 hours 06/14/18 1303 06/17/18 1626   06/14/18 1300  cefTRIAXone (ROCEPHIN) 2 g in sodium chloride 0.9 % 100 mL IVPB     2 g 200 mL/hr over 30 Minutes Intravenous Every 24 hours 06/14/18 1258     06/13/18 0830  vancomycin (VANCOCIN) 1,250 mg in sodium chloride 0.9 % 250 mL IVPB  Status:  Discontinued     1,250 mg 166.7 mL/hr over 90 Minutes Intravenous Every 12 hours 06/12/18 2012 06/13/18 1418   06/13/18 0800  piperacillin-tazobactam (ZOSYN) IVPB 3.375 g  Status:  Discontinued     3.375 g 12.5 mL/hr over 240 Minutes Intravenous Every 8 hours 06/12/18 2012 06/13/18 1418   06/12/18 2030  piperacillin-tazobactam (ZOSYN) IVPB 3.375 g     3.375 g 100 mL/hr over 30 Minutes Intravenous  Once 06/12/18 2008 06/12/18 2320   06/12/18 2030  vancomycin (VANCOCIN) 2,000 mg in sodium chloride 0.9 % 500 mL IVPB     2,000 mg 250 mL/hr over 120 Minutes Intravenous  Once 06/12/18 2008 06/12/18 2331        Subjective:   Jeffrey Nelson was seen and examined today.  Planing of low back pain 10/10, worse on sitting up.  Better with pain medications. Patient denies dizziness, chest pain, shortness of breath, abdominal pain, N/V/D/C.  Objective:   Vitals:   06/18/18 2136 06/18/18 2200 06/19/18 0519 06/19/18 0745  BP: (!) 158/68 136/70 130/60 (!) 162/72  Pulse: 73 69 64 70  Resp: 17 14 16 18   Temp: 98.2 F (36.8 C) 98.5 F (36.9 C) 98.9 F (37.2 C) 98.1 F (36.7 C)  TempSrc: Oral Oral Oral Oral  SpO2: 97% 97% 95% 96%  Weight:   93.3 kg     Intake/Output Summary (Last 24 hours) at 06/19/2018 1447 Last data filed at 06/19/2018 1359 Gross per 24 hour  Intake 1834.68 ml  Output 1490 ml  Net 344.68 ml     Wt Readings from Last 3 Encounters:  06/19/18 93.3 kg  06/12/18 99.3 kg  05/29/18 99.3 kg     Exam  General: Alert and oriented x 3, NAD  Eyes:   HEENT:  Atraumatic, normocephalic,  Cardiovascular: S1 S2  auscultated, no rubs, murmurs or gallops. Regular rate and rhythm.  Respiratory: Clear to auscultation bilaterally, no wheezing, rales or rhonchi  Gastrointestinal: Soft, nontender, nondistended, + bowel sounds  Ext: no pedal edema bilaterally  Neuro: no new FND's, strength bilaterally equal  Musculoskeletal: No digital cyanosis, clubbing  Skin: No rashes  Psych: Normal affect and demeanor, alert and oriented x3    Data Reviewed:  I  have personally reviewed following labs and imaging studies  Micro Results Recent Results (from the past 240 hour(s))  Blood culture (routine x 2)     Status: None   Collection Time: 06/12/18  1:28 PM  Result Value Ref Range Status   Specimen Description BLOOD LEFT ANTECUBITAL  Final   Special Requests   Final    BOTTLES DRAWN AEROBIC AND ANAEROBIC Blood Culture adequate volume   Culture   Final    NO GROWTH 5 DAYS Performed at [facility], [address], [place], [place] [address]    Report Status 06/17/2018 FINAL  Final  Blood culture (routine x 2)     Status: None   Collection Time: 06/12/18  1:29 PM  Result Value Ref Range Status   Specimen Description BLOOD RIGHT ANTECUBITAL  Final   Special Requests   Final    BOTTLES DRAWN AEROBIC AND ANAEROBIC Blood Culture adequate volume   Culture   Final    NO GROWTH 5 DAYS Performed at [facility], [address] [place], [place] [address]    Report Status 06/17/2018 FINAL  Final  Aerobic/Anaerobic Culture (surgical/deep wound)     Status: None   Collection Time: 06/14/18 10:20 AM  Result Value Ref Range Status   Specimen Description BACK  Final   Special Requests DISC  Final   Gram Stain   Final    FEW WBC PRESENT,BOTH PMN AND MONONUCLEAR NO ORGANISMS SEEN    Culture   Final    No growth aerobically or anaerobically. Performed at [facility], [address], [place], [place] [address]    Report Status 06/19/2018 FINAL  Final    Radiology Reports Mr Lumbar  Spine Wo Contrast  Result Date: 06/17/2018 CLINICAL DATA:  Discitis osteomyelitis. History of lumbar spine surgery with hardware failure and compression fracture. EXAM: MRI LUMBAR SPINE WITHOUT CONTRAST TECHNIQUE: Multiplanar, multisequence MR imaging of the lumbar spine was performed. No intravenous contrast was administered. COMPARISON:  MRI of the lumbar spine June 12, 2018 and CT abdomen and pelvis June 12, 2018 FINDINGS: SEGMENTATION: For the purposes of this report, the last well-formed intervertebral disc is reported as L5-S1. ALIGNMENT: Maintained lumbar lordosis. No malalignment. VERTEBRAE:Status post L3 through S1 PLIF. Redemonstration of moderate L3 fracture with approximately 50% height loss, the pedicle screws are migrated to the superior cortex, unchanged. Pedicle screw of L4 may be extending into the disc space. L3-4 subsidence and abnormal signal within the disc space at L2-3, L3-4. Mild old T12 compression fracture. CONUS MEDULLARIS AND CAUDA EQUINA: Conus medullaris terminates at T12-L1 and demonstrates normal morphology and signal characteristics. Cauda equina is normal. PARASPINAL AND OTHER SOFT TISSUES: LEFT greater than RIGHT iliopsoas myositis without focal fluid collection. Paraspinal denervation at below the level surgical intervention. DISC LEVELS: T12-L1: No axial sequences. Stable small broad-based disc bulge without canal stenosis. Mild neural foraminal narrowing. L1-2: Small broad-based disc bulge asymmetric to the RIGHT. No canal stenosis. Mild RIGHT neural foraminal narrowing. L2-3: Disc bulge. No canal stenosis. Moderate to severe RIGHT greater LEFT neural foraminal narrowing. L3-4: PLIF and posterior decompression without canal stenosis. Soft tissue effacing the neural foramen. L4-5: PLIF. Posterior decompression. No canal stenosis. Abnormal soft tissue LEFT neural foramen. L5-S1: Transitional anatomy, sacralized L5 vertebral body. No disc bulge, canal stenosis or neural  foraminal narrowing. IMPRESSION: 1. Similar appearance of L2-3 and L3-4 discitis osteomyelitis. Associated iliopsoas myositis. 2. Status post L3 through L5 PLIF and posterior decompression with  hardware failure. Moderate similar L2 pathologic fracture. 3. No canal stenosis. 4. Neural foraminal narrowing T12-L1 through L4-5; soft tissue effacing the neural foramen L2-3 through L4-5 could reflect disc material or purulent material. Electronically Signed   By: Awilda Metro M.D.   On: 06/17/2018 21:39   Mr Lumbar Spine Wo Contrast  Result Date: 06/12/2018 CLINICAL DATA:  83 year old male with increased lumbar spine pain status post fusion 6 months ago. Fractures/collapse of the L3 vertebral body. EXAM: MRI LUMBAR SPINE WITHOUT CONTRAST TECHNIQUE: Multiplanar, multisequence MR imaging of the lumbar spine was performed. No intravenous contrast was administered. COMPARISON:  CT lumbar Spine [facility] 05/14/2018. Lumbar MRI 12/05/2017, and earlier. FINDINGS: Segmentation: Hypoplastic ribs assumed T12 as on the CT and April MRI numbering. Alignment: Straightening of lumbar lordosis is stable from last month. Vertebrae: Confluent marrow edema from the posterior inferior L2 body, throughout the L3 body, and into the superior and posterior L4 body. See series 7, image 9. This has progressed from the L3-L4 level endplate edema seen in April, with moderate collapse of L3 and erosion of the L3-L4 endplates since the prior MRI. Interbody implant at L3-L4. New abnormal increased T2 and STIR signal in the L2-L3 disc, with new L2 endplate and body marrow edema since April. The L1 and L5 levels appear stable and intact. Mild chronic T12 inferior endplate compression is stable. Intact visible lower thoracic levels. Intact visible sacrum and SI joints. Conus medullaris and cauda equina: Conus extends to the T12-L1 level. No lower spinal cord or conus signal abnormality. Paraspinal and other soft tissues:  Confluent left greater than right medial psoas muscle edema as seen on series 6, image 13 and series 5, images 17 (on the left) and 1 (on the right). No intramuscular fluid collection is identified. The STIR images demonstrate prominent marrow edema in the L3 transverse processes. Superimposed postoperative changes to the lower lumbar erector spinae muscles. Negative visible abdominal viscera. Disc levels: No Lower spinal stenosis above L2. L2-L3: New abnormal increased T2 and STIR signal within the disc. Progressed circumferential disc bulge with chronic mild to moderate facet and ligament flavum hypertrophy. New mild to moderate spinal stenosis. Chronic mild to moderate L2 foraminal stenosis greater on the right. L3-L4: Posterior and interbody fusion hardware here and prior decompression. No spinal stenosis. There appears to be soft tissue edema affecting the bilateral L3 neural foramina. L4-L5: Posterior and interbody fusion hardware plus decompression here. Mild residual right L4 foraminal stenosis related to facet hypertrophy. L5-S1: Stable and negative aside from mild facet hypertrophy. IMPRESSION: 1. Constellation of imaging findings since the 12/05/2017 MRI is most compatible with prolonged Discitis Osteomyelitis at L3-L4, and extension of Discitis Osteomyelitis to L2-L3 since April. There is bilateral L3 through L5 spinal fusion hardware in place. Associated partial collapse of L3 with L3 and L4 endplate erosion. Abnormal L2-L3 disc and L2 inferior endplates now. 2. Bilateral psoas muscle edema/inflammation. No intramuscular or epidural abscess identified. 3. Multifactorial mild to moderate spinal stenosis at L2-L3 is new since April. Electronically Signed   By: Odessa Fleming M.D.   On: 06/12/2018 16:14   Nm Bone Scan 3 Phase  Result Date: 06/06/2018 CLINICAL DATA:  SI joint pain, question spondyloarthropathy, painful loosening of hardware L3, LEFT sacral pain, prior L3-L5 fusion in April 2019, assess for  sacral insufficiency fracture EXAM: NUCLEAR MEDICINE 3-PHASE BONE SCAN TECHNIQUE: Radionuclide angiographic images, immediate static blood pool images, and 3-hour delayed static images were obtained of the lumbar  spine, pelvis and hips after intravenous injection of radiopharmaceutical. RADIOPHARMACEUTICALS:  22 mCi Tc-65m MDP IV COMPARISON:  None Correlation: Lumbar spine radiographs 05/29/2018, pelvic radiograph 01/17/2018 FINDINGS: Vascular phase: Normal blood flow to the hips and pelvis Blood pool phase: Normal blood pool at the pelvis and hips. Mildly increased blood pool in the lumbar region at approximately the L3-L5 Delayed phase: Delayed images show normal tracer accumulation at the hip joints and SI joints. Normal tracer localization in the pelvis and proximal femora. No scintigraphic evidence of sacral insufficiency fracture. Diffusely increased tracer localization is seen in lumbar spine at L3-L5, predominantly at L3-L4, corresponding to the surgical levels identified on recent radiographs. These findings are consistent with the relative recent age of surgery and expected postoperative uptake. IMPRESSION: Normal scintigraphic appearance of the pelvis, SI joints, sacrum, and hip joints. Increased blood pool and delayed uptake of tracer in lower lumbar spine corresponding to prior L3-L5 fusion, with greatest uptake centered at L3-L4. Degree of uptake is consistent with preceding spine surgery. Due to postsurgical uptake, unable to exclude underlying infection. Electronically Signed   By: [name] M.D.   On: 06/06/2018 13:44   Ct Abdomen Pelvis W Contrast  Result Date: 06/12/2018 CLINICAL DATA:  Left lower quadrant pain with diarrhea. EXAM: CT ABDOMEN AND PELVIS WITH CONTRAST TECHNIQUE: Multidetector CT imaging of the abdomen and pelvis was performed using the standard protocol following bolus administration of intravenous contrast. CONTRAST:  100 cc Omnipaque 300 intravenous COMPARISON:  MRI of the  lumbar spine from the same day FINDINGS: Lower chest:  Coronary calcification.  No acute finding Hepatobiliary: No focal liver abnormality.Single stone within the gallbladder which is full but shows no inflammatory changes. Mild prominence of the biliary tree without calcified choledocholithiasis Pancreas: Unremarkable. Spleen: Unremarkable. 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 No hydronephrosis or stone. Subcentimeter low-densities in the left kidney, too small for densitometry. Unremarkable bladder. Stomach/Bowel:  No obstruction. No appendicitis. Vascular/Lymphatic: No acute vascular abnormality. Extensive atherosclerotic calcification of the aorta. No mass or adenopathy. Reproductive:Probable central prostate nodule.  No acute finding. Other: No ascites or pneumoperitoneum. Musculoskeletal: L2-3 and L3-4 discitis osteomyelitis. There has been L3-4 PLIF. The surrounding paravertebral fat is edematous. The pedicle screws at L3 and L4 show halo in/loosening. No evident collection. There is subsequent MRI available. IMPRESSION: 1. L2-3 and L3-4 discitis osteomyelitis which involves L3-4 PLIF hardware. 2. No evidence of colitis or other inflammation to explain diarrhea. 3. Cholelithiasis. Electronically Signed   By: Marnee Spring M.D.   On: 06/12/2018 18:13   [place] Ekg Site Rite  Result Date: 06/14/2018 If Site Rite image not attached, placement could not be confirmed due to current cardiac rhythm.  Ir Lumbar Disc Aspiration W/img Guide  Result Date: 06/14/2018 INDICATION: L2-3 discitis EXAM: L2-3 DISC ASPIRATION MEDICATIONS: The patient is currently admitted to the hospital and receiving intravenous antibiotics. The antibiotics were administered within an appropriate time frame prior to the initiation of the procedure. ANESTHESIA/SEDATION: Fentanyl 100 mcg IV; Versed 2 mg IV Moderate Sedation Time:  10 minutes The patient was continuously monitored during the procedure by the  interventional radiology nurse under my direct supervision. COMPLICATIONS: None immediate. PROCEDURE: Informed written consent was obtained from the patient after a thorough discussion of the procedural risks, benefits and alternatives. All questions were addressed. Maximal Sterile Barrier Technique was utilized including caps, mask, sterile gowns, sterile gloves, sterile drape, hand hygiene and skin antiseptic. A timeout was performed prior to the initiation of  the procedure. The back was prepped and draped in a sterile fashion. 1% lidocaine was utilized for local anesthesia. Under fluoroscopic guidance, an 18 gauge needle was advanced into the L2-3 disc via right posterolateral approach. Two drops gray pus was aspirated. FINDINGS: Images document needle placement in the L2-3 disc. IMPRESSION: Successful L2-3 disc aspiration yielding 2 drops great pus. Electronically Signed   By: Jolaine Click M.D.   On: 06/14/2018 13:30    Lab Data:  CBC: Recent Labs  Lab 06/13/18 0535 06/14/18 0547 06/16/18 0415 06/18/18 0326  WBC 7.2 8.2 8.4 7.8  HGB 12.1* 11.3* 10.9* 11.4*  HCT 37.9* 35.1* 33.8* 35.8*  MCV 95.7 96.2 95.5 95.5  PLT 308 278 294 369   Basic Metabolic Panel: Recent Labs  Lab 06/13/18 0535 06/14/18 0547 06/16/18 0415 06/18/18 0326 06/19/18 0330  NA 137 137 137 138  --   K 4.3 3.9 3.7 3.8  --   CL 107 104 103 102  --   CO2 23 22 29 29   --   GLUCOSE 119* 124* 133* 113*  --   BUN 14 11 9  7*  --   CREATININE 1.04 0.86 0.78 0.76 0.88  CALCIUM 8.7* 8.9 8.9 8.9  --    GFR: Estimated Creatinine Clearance: 76.5 mL/min (by C-G formula based on SCr of 0.88 mg/dL). Liver Function Tests: No results for input(s): AST, ALT, ALKPHOS, BILITOT, PROT, ALBUMIN in the last 168 hours. No results for input(s): LIPASE, AMYLASE in the last 168 hours. No results for input(s): AMMONIA in the last 168 hours. Coagulation Profile: Recent Labs  Lab 06/14/18 0547  INR 1.05   Cardiac Enzymes: No  results for input(s): CKTOTAL, CKMB, CKMBINDEX, TROPONINI in the last 168 hours. BNP (last 3 results) No results for input(s): PROBNP in the last 8760 hours. HbA1C: No results for input(s): HGBA1C in the last 72 hours. CBG: Recent Labs  Lab 06/18/18 1130 06/18/18 1635 06/18/18 2201 06/19/18 0743 06/19/18 1140  GLUCAP 150* 154* 145* 110* 146*   Lipid Profile: No results for input(s): CHOL, HDL, LDLCALC, TRIG, CHOLHDL, LDLDIRECT in the last 72 hours. Thyroid Function Tests: No results for input(s): TSH, T4TOTAL, FREET4, T3FREE, THYROIDAB in the last 72 hours. Anemia Panel: No results for input(s): VITAMINB12, FOLATE, FERRITIN, TIBC, IRON, RETICCTPCT in the last 72 hours. Urine analysis:    Component Value Date/Time   COLORURINE YELLOW 06/12/2018 2320   APPEARANCEUR CLEAR 06/12/2018 2320   LABSPEC >1.046 (H) 06/12/2018 2320   PHURINE 5.0 06/12/2018 2320   GLUCOSEU NEGATIVE 06/12/2018 2320   HGBUR NEGATIVE 06/12/2018 2320   BILIRUBINUR NEGATIVE 06/12/2018 2320   KETONESUR NEGATIVE 06/12/2018 2320   PROTEINUR NEGATIVE 06/12/2018 2320   UROBILINOGEN 0.2 05/07/2015 1345   NITRITE NEGATIVE 06/12/2018 2320   LEUKOCYTESUR NEGATIVE 06/12/2018 2320     Mi Balla M.D. [facility] 06/19/2018, 2:47 PM  Pager: [phone] Between 7am to 7pm - call Pager - [phone]  After 7pm go to [email or web address] - password [email or web address]  Call night coverage person covering after 7pm

## 2018-06-19 NOTE — Progress Notes (Signed)
Pharmacy Antibiotic Note  Jeffrey Nelson is a 83 y.o. male admitted on 06/12/2018 with osteomyelitis.  Pharmacy has been consulted for vancomycin dosing. MRI reveals discitis osteomyelitis at L3-L4 and extension of discitis osteomyelitis L2-L3. Pt recently diagnosed w/ hardware loosening and compression fracture of L3. Patient was on vancomycin and zosyn, but these were held on 10/17 pending aspirate. Ok to resume vancomycin and ceftriaxone per ID with 6 weeks of therapy planned.  Ortho surgery planned for Friday -> Plavix on hold  Last vancomycin trough 10/21 Scr stable  Plan:  Continue Vancomycin 1,250 mg IV q12h Continue Ceftriaxone 2 g IV daily Monitor and adjust abx per renal fx, C&S, vanc trough as needed  Weight: 205 lb 11 oz (93.3 kg)  Temp (24hrs), Avg:98.5 F (36.9 C), Min:98.1 F (36.7 C), Max:98.9 F (37.2 C)  Recent Labs  Lab 06/12/18 1308 06/13/18 0535 06/14/18 0547 06/16/18 0415 06/17/18 1401 06/18/18 0326 06/19/18 0330  WBC 8.6 7.2 8.2 8.4  --  7.8  --   CREATININE 1.03 1.04 0.86 0.78  --  0.76 0.88  VANCOTROUGH  --   --   --   --  14*  --   --     Estimated Creatinine Clearance: 76.5 mL/min (by C-G formula based on SCr of 0.88 mg/dL).    Allergies  Allergen Reactions  . Ezetimibe Other (See Comments)    Cramps Cramps  . Statins Other (See Comments)    Cramps Cramps    Antimicrobials this admission: Vancomycin 10/16>>10/17; 10/18>> Zosyn 10/16>>10/17 Ceftriaxone 10/18>>  Microbiology results: 10/16 bcx: ngtd 10/18 deep wound culture: ngtd  Thank you for allowing pharmacy to be a part of this patient's care. Okey Regal, PharmD Please see AMION for all Pharmacists' Contact Phone Numbers 06/19/2018, 10:08 AM

## 2018-06-19 NOTE — Assessment & Plan Note (Signed)
Removal of hardware planned for 10/25 with vertebral biopsy and placement of stimulan liquid into the vertebral bodies.

## 2018-06-20 ENCOUNTER — Encounter (HOSPITAL_COMMUNITY): Payer: Self-pay | Admitting: Anesthesiology

## 2018-06-20 LAB — BASIC METABOLIC PANEL
ANION GAP: 7 (ref 5–15)
BUN: 11 mg/dL (ref 8–23)
CALCIUM: 9.3 mg/dL (ref 8.9–10.3)
CO2: 30 mmol/L (ref 22–32)
CREATININE: 0.83 mg/dL (ref 0.61–1.24)
Chloride: 100 mmol/L (ref 98–111)
GFR calc Af Amer: >60 mL/min (ref >60)
GLUCOSE: 153 mg/dL — AB (ref 70–99)
Potassium: 4.2 mmol/L (ref 3.5–5.1)
Sodium: 137 mmol/L (ref 135–145)

## 2018-06-20 LAB — CBC
HEMATOCRIT: 36.2 % — AB (ref 39.0–52.0)
Hemoglobin: 11.7 g/dL — ABNORMAL LOW (ref 13.0–17.0)
MCH: 31 pg (ref 26.0–34.0)
MCHC: 32.3 g/dL (ref 30.0–36.0)
MCV: 95.8 fL (ref 80.0–100.0)
NRBC: 0 % (ref 0.0–0.2)
PLATELETS: 380 10*3/uL (ref 150–400)
RBC: 3.78 MIL/uL — ABNORMAL LOW (ref 4.22–5.81)
RDW: 13.6 % (ref 11.5–15.5)
WBC: 8.5 10*3/uL (ref 4.0–10.5)

## 2018-06-20 LAB — GLUCOSE, CAPILLARY
GLUCOSE-CAPILLARY: 142 mg/dL — AB (ref 70–99)
Glucose-Capillary: 118 mg/dL — ABNORMAL HIGH (ref 70–99)
Glucose-Capillary: 179 mg/dL — ABNORMAL HIGH (ref 70–99)
Glucose-Capillary: 125 mg/dL — ABNORMAL HIGH (ref 70–99)

## 2018-06-20 MED ORDER — CHLORHEXIDINE GLUCONATE 4 % EX LIQD
60.0000 mL | Freq: Once | CUTANEOUS | Status: AC
  Administered 2018-06-21: 4 via TOPICAL
  Filled 2018-06-20: qty 60

## 2018-06-20 MED ORDER — SODIUM CHLORIDE 0.9 % IV SOLN
INTRAVENOUS | Status: DC
  Administered 2018-06-20: 23:00:00 via INTRAVENOUS

## 2018-06-20 MED ORDER — POVIDONE-IODINE 10 % EX SWAB
2.0000 "application " | Freq: Once | CUTANEOUS | Status: AC
  Administered 2018-06-21: 2 via TOPICAL

## 2018-06-20 NOTE — Progress Notes (Signed)
[facility]                                                                              Patient Demographics  Jeffrey Nelson, is a 83 y.o. male, DOB - [birth year], ZOX:[id number]  Admit date - 06/12/2018   Admitting Physician Mauricio Annett Gula, MD  Outpatient Primary MD for the patient is Ryter-Brown, Fritzi Mandes, MD  Outpatient specialists:   LOS - 8  days   Medical records reviewed and are as summarized below:    Chief Complaint  Patient presents with  . Back Pain       Brief summary   83 y.o.malewith medical history significant ofcompression fracture of L3 lumbar vertebra with nonunion, status post lumbar spinal fusion (April, 2019),type 2 diabetes mellitus, coronary artery disease status post bypass grafting and stenting, hypertension and dyslipidemia.   Patient presented to his orthopedic surgeon with complains of worsening back pain over the last 2 weeks.  He underwent lumbar spine surgery with fusion earlier this year.  Patient was sent to the emergency department by the orthopedic surgeon.  MRI was done which raise concern for osteomyelitis at L3-L4 and L2-L3.  Patient was hospitalized for further management.  Orthopedics and infectious disease consulted.   Assessment & Plan   Principal problem L2-L3, L3-L4 discitis/osteomyelitis involving the hardware -Seen by orthopedics and ID -Underwent disc aspiration by IR on 10/18, cultures negative so far -Patient placed on vancomycin and ceftriaxone, per ID plan for 6 weeks of IV antibiotics -PICC line placed -Plan for OR on 10/25, n.p.o. after midnight seen  Active Problems:  Acute on chronic back pain -Pain better today, 8/10 in the lumbar region, sitting in the chair, appears to be comfortable.  Continue current pain medication regimen, no escalation  CAD status post CABG, angioplasty Cardiac cath in 02/2017, last stent placed in November 2017, Plavix currently on hold for surgery on 10/25 No  chest pain or shortness of breath, stable  Diabetes mellitus type 2 -CBGs fairly stable  Constipation Continue bowel regimen, likely due to narcotics  Essential hypertension Continue beta-blocker, worsening likely due to pain and anxiety  BPH Continue tamsulosin, finasteride   Code Status: Full CODE STATUS DVT Prophylaxis:  Lovenox  Family Communication: Discussed in detail with the patient, all imaging results, lab results explained to the patient   Disposition Plan: Plan for OR tomorrow  Time Spent in minutes 25-minute  Procedures:  L2-L3 disc aspiration by IR on 10/18  Consultants:   Orthopedics Infectious disease IR  Antimicrobials:      Medications  Scheduled Meds: . acetaminophen  650 mg Oral Q6H  . aspirin EC  81 mg Oral Daily  . feeding supplement (ENSURE ENLIVE)  237 mL Oral TID PC & HS  . finasteride  5 mg Oral Daily  . insulin aspart  0-9 Units Subcutaneous TID WC  . metoprolol tartrate  25 mg Oral BID  . pantoprazole  40 mg Oral Daily  . polyethylene glycol  17 g Oral BID  . senna  1 tablet Oral BID  . tamsulosin  0.4 mg Oral QPC supper   Continuous Infusions: . sodium  chloride 10 mL/hr at 06/18/18 0547  . cefTRIAXone (ROCEPHIN)  IV 2 g (06/20/18 1257)  . vancomycin 1,250 mg (06/20/18 0517)   PRN Meds:.cyclobenzaprine, HYDROmorphone (DILAUDID) injection, ondansetron, oxyCODONE, simethicone, sodium chloride flush, sodium phosphate, traZODone   Antibiotics   Anti-infectives (From admission, onward)   Start     Dose/Rate Route Frequency Ordered Stop   06/17/18 1700  vancomycin (VANCOCIN) 1,250 mg in sodium chloride 0.9 % 250 mL IVPB     1,250 mg 166.7 mL/hr over 90 Minutes Intravenous Every 12 hours 06/17/18 1626     06/14/18 1315  vancomycin (VANCOCIN) IVPB 1000 mg/200 mL premix  Status:  Discontinued     1,000 mg 200 mL/hr over 60 Minutes Intravenous Every 12 hours 06/14/18 1303 06/17/18 1626   06/14/18 1300  cefTRIAXone (ROCEPHIN) 2 g  in sodium chloride 0.9 % 100 mL IVPB     2 g 200 mL/hr over 30 Minutes Intravenous Every 24 hours 06/14/18 1258     06/13/18 0830  vancomycin (VANCOCIN) 1,250 mg in sodium chloride 0.9 % 250 mL IVPB  Status:  Discontinued     1,250 mg 166.7 mL/hr over 90 Minutes Intravenous Every 12 hours 06/12/18 2012 06/13/18 1418   06/13/18 0800  piperacillin-tazobactam (ZOSYN) IVPB 3.375 g  Status:  Discontinued     3.375 g 12.5 mL/hr over 240 Minutes Intravenous Every 8 hours 06/12/18 2012 06/13/18 1418   06/12/18 2030  piperacillin-tazobactam (ZOSYN) IVPB 3.375 g     3.375 g 100 mL/hr over 30 Minutes Intravenous  Once 06/12/18 2008 06/12/18 2320   06/12/18 2030  vancomycin (VANCOCIN) 2,000 mg in sodium chloride 0.9 % 500 mL IVPB     2,000 mg 250 mL/hr over 120 Minutes Intravenous  Once 06/12/18 2008 06/12/18 2331        Subjective:   Jeffrey Nelson was seen and examined today.  States low back pain better today 8/10, sitting up in the chair, appears to be comfortable.  No focal neurological deficits. Patient denies dizziness, chest pain, shortness of breath, abdominal pain, N/V/D/C.  Objective:   Vitals:   06/19/18 1657 06/19/18 2102 06/20/18 0511 06/20/18 0835  BP: (!) 146/74 (!) 162/66 (!) 155/62 (!) 188/86  Pulse: 64 70 62 78  Resp: 18 18 18 18   Temp: 98.4 F (36.9 C) 98 F (36.7 C) 98.6 F (37 C)   TempSrc: Oral  Oral   SpO2: 97% 96% 95% 100%  Weight:  93.3 kg      Intake/Output Summary (Last 24 hours) at 06/20/2018 1341 Last data filed at 06/20/2018 1100 Gross per 24 hour  Intake 2050 ml  Output 700 ml  Net 1350 ml     Wt Readings from Last 3 Encounters:  06/19/18 93.3 kg  06/12/18 99.3 kg  05/29/18 99.3 kg     Exam   General: Alert and oriented x 3, NAD, sitting up in the chair, appears comfortable  Eyes:   HEENT:    Cardiovascular: S1 S2 clear, RRR, No pedal edema b/l  Respiratory: CTA B  Gastrointestinal: Soft, nontender, nondistended, + bowel  sounds  Ext: no pedal edema bilaterally  Neuro no new deficits  Musculoskeletal: No digital cyanosis, clubbing  Skin: No rashes  Psych: Normal affect and demeanor, alert and oriented x3     Data Reviewed:  I have personally reviewed following labs and imaging studies  Micro Results Recent Results (from the past 240 hour(s))  Blood culture (routine x 2)     Status: None  Collection Time: 06/12/18  1:28 PM  Result Value Ref Range Status   Specimen Description BLOOD LEFT ANTECUBITAL  Final   Special Requests   Final    BOTTLES DRAWN AEROBIC AND ANAEROBIC Blood Culture adequate volume   Culture   Final    NO GROWTH 5 DAYS Performed at [facility], [address], [place], [place] [address]    Report Status 06/17/2018 FINAL  Final  Blood culture (routine x 2)     Status: None   Collection Time: 06/12/18  1:29 PM  Result Value Ref Range Status   Specimen Description BLOOD RIGHT ANTECUBITAL  Final   Special Requests   Final    BOTTLES DRAWN AEROBIC AND ANAEROBIC Blood Culture adequate volume   Culture   Final    NO GROWTH 5 DAYS Performed at [facility], [address] [place], [place] [address]    Report Status 06/17/2018 FINAL  Final  Aerobic/Anaerobic Culture (surgical/deep wound)     Status: None   Collection Time: 06/14/18 10:20 AM  Result Value Ref Range Status   Specimen Description BACK  Final   Special Requests DISC  Final   Gram Stain   Final    FEW WBC PRESENT,BOTH PMN AND MONONUCLEAR NO ORGANISMS SEEN    Culture   Final    No growth aerobically or anaerobically. Performed at [facility], [address], [place], [place] [address]    Report Status 06/19/2018 FINAL  Final    Radiology Reports Mr Lumbar Spine Wo Contrast  Result Date: 06/17/2018 CLINICAL DATA:  Discitis osteomyelitis. History of lumbar spine surgery with hardware failure and compression fracture. EXAM: MRI LUMBAR SPINE WITHOUT CONTRAST TECHNIQUE: Multiplanar,  multisequence MR imaging of the lumbar spine was performed. No intravenous contrast was administered. COMPARISON:  MRI of the lumbar spine June 12, 2018 and CT abdomen and pelvis June 12, 2018 FINDINGS: SEGMENTATION: For the purposes of this report, the last well-formed intervertebral disc is reported as L5-S1. ALIGNMENT: Maintained lumbar lordosis. No malalignment. VERTEBRAE:Status post L3 through S1 PLIF. Redemonstration of moderate L3 fracture with approximately 50% height loss, the pedicle screws are migrated to the superior cortex, unchanged. Pedicle screw of L4 may be extending into the disc space. L3-4 subsidence and abnormal signal within the disc space at L2-3, L3-4. Mild old T12 compression fracture. CONUS MEDULLARIS AND CAUDA EQUINA: Conus medullaris terminates at T12-L1 and demonstrates normal morphology and signal characteristics. Cauda equina is normal. PARASPINAL AND OTHER SOFT TISSUES: LEFT greater than RIGHT iliopsoas myositis without focal fluid collection. Paraspinal denervation at below the level surgical intervention. DISC LEVELS: T12-L1: No axial sequences. Stable small broad-based disc bulge without canal stenosis. Mild neural foraminal narrowing. L1-2: Small broad-based disc bulge asymmetric to the RIGHT. No canal stenosis. Mild RIGHT neural foraminal narrowing. L2-3: Disc bulge. No canal stenosis. Moderate to severe RIGHT greater LEFT neural foraminal narrowing. L3-4: PLIF and posterior decompression without canal stenosis. Soft tissue effacing the neural foramen. L4-5: PLIF. Posterior decompression. No canal stenosis. Abnormal soft tissue LEFT neural foramen. L5-S1: Transitional anatomy, sacralized L5 vertebral body. No disc bulge, canal stenosis or neural foraminal narrowing. IMPRESSION: 1. Similar appearance of L2-3 and L3-4 discitis osteomyelitis. Associated iliopsoas myositis. 2. Status post L3 through L5 PLIF and posterior decompression with hardware failure. Moderate similar L2  pathologic fracture. 3. No canal stenosis. 4. Neural foraminal narrowing T12-L1 through L4-5; soft tissue effacing the neural foramen L2-3 through L4-5 could reflect disc material or  purulent material. Electronically Signed   By: Awilda Metro M.D.   On: 06/17/2018 21:39   Mr Lumbar Spine Wo Contrast  Result Date: 06/12/2018 CLINICAL DATA:  83 year old male with increased lumbar spine pain status post fusion 6 months ago. Fractures/collapse of the L3 vertebral body. EXAM: MRI LUMBAR SPINE WITHOUT CONTRAST TECHNIQUE: Multiplanar, multisequence MR imaging of the lumbar spine was performed. No intravenous contrast was administered. COMPARISON:  CT lumbar Spine [facility] 05/14/2018. Lumbar MRI 12/05/2017, and earlier. FINDINGS: Segmentation: Hypoplastic ribs assumed T12 as on the CT and April MRI numbering. Alignment: Straightening of lumbar lordosis is stable from last month. Vertebrae: Confluent marrow edema from the posterior inferior L2 body, throughout the L3 body, and into the superior and posterior L4 body. See series 7, image 9. This has progressed from the L3-L4 level endplate edema seen in April, with moderate collapse of L3 and erosion of the L3-L4 endplates since the prior MRI. Interbody implant at L3-L4. New abnormal increased T2 and STIR signal in the L2-L3 disc, with new L2 endplate and body marrow edema since April. The L1 and L5 levels appear stable and intact. Mild chronic T12 inferior endplate compression is stable. Intact visible lower thoracic levels. Intact visible sacrum and SI joints. Conus medullaris and cauda equina: Conus extends to the T12-L1 level. No lower spinal cord or conus signal abnormality. Paraspinal and other soft tissues: Confluent left greater than right medial psoas muscle edema as seen on series 6, image 13 and series 5, images 17 (on the left) and 1 (on the right). No intramuscular fluid collection is identified. The STIR images demonstrate  prominent marrow edema in the L3 transverse processes. Superimposed postoperative changes to the lower lumbar erector spinae muscles. Negative visible abdominal viscera. Disc levels: No Lower spinal stenosis above L2. L2-L3: New abnormal increased T2 and STIR signal within the disc. Progressed circumferential disc bulge with chronic mild to moderate facet and ligament flavum hypertrophy. New mild to moderate spinal stenosis. Chronic mild to moderate L2 foraminal stenosis greater on the right. L3-L4: Posterior and interbody fusion hardware here and prior decompression. No spinal stenosis. There appears to be soft tissue edema affecting the bilateral L3 neural foramina. L4-L5: Posterior and interbody fusion hardware plus decompression here. Mild residual right L4 foraminal stenosis related to facet hypertrophy. L5-S1: Stable and negative aside from mild facet hypertrophy. IMPRESSION: 1. Constellation of imaging findings since the 12/05/2017 MRI is most compatible with prolonged Discitis Osteomyelitis at L3-L4, and extension of Discitis Osteomyelitis to L2-L3 since April. There is bilateral L3 through L5 spinal fusion hardware in place. Associated partial collapse of L3 with L3 and L4 endplate erosion. Abnormal L2-L3 disc and L2 inferior endplates now. 2. Bilateral psoas muscle edema/inflammation. No intramuscular or epidural abscess identified. 3. Multifactorial mild to moderate spinal stenosis at L2-L3 is new since April. Electronically Signed   By: Odessa Fleming M.D.   On: 06/12/2018 16:14   Nm Bone Scan 3 Phase  Result Date: 06/06/2018 CLINICAL DATA:  SI joint pain, question spondyloarthropathy, painful loosening of hardware L3, LEFT sacral pain, prior L3-L5 fusion in April 2019, assess for sacral insufficiency fracture EXAM: NUCLEAR MEDICINE 3-PHASE BONE SCAN TECHNIQUE: Radionuclide angiographic images, immediate static blood pool images, and 3-hour delayed static images were obtained of the lumbar spine, pelvis  and hips after intravenous injection of radiopharmaceutical. RADIOPHARMACEUTICALS:  22 mCi Tc-43m MDP IV COMPARISON:  None Correlation: Lumbar spine radiographs 05/29/2018, pelvic radiograph 01/17/2018 FINDINGS: Vascular phase: Normal blood  flow to the hips and pelvis Blood pool phase: Normal blood pool at the pelvis and hips. Mildly increased blood pool in the lumbar region at approximately the L3-L5 Delayed phase: Delayed images show normal tracer accumulation at the hip joints and SI joints. Normal tracer localization in the pelvis and proximal femora. No scintigraphic evidence of sacral insufficiency fracture. Diffusely increased tracer localization is seen in lumbar spine at L3-L5, predominantly at L3-L4, corresponding to the surgical levels identified on recent radiographs. These findings are consistent with the relative recent age of surgery and expected postoperative uptake. IMPRESSION: Normal scintigraphic appearance of the pelvis, SI joints, sacrum, and hip joints. Increased blood pool and delayed uptake of tracer in lower lumbar spine corresponding to prior L3-L5 fusion, with greatest uptake centered at L3-L4. Degree of uptake is consistent with preceding spine surgery. Due to postsurgical uptake, unable to exclude underlying infection. Electronically Signed   By: [name] M.D.   On: 06/06/2018 13:44   Ct Abdomen Pelvis W Contrast  Result Date: 06/12/2018 CLINICAL DATA:  Left lower quadrant pain with diarrhea. EXAM: CT ABDOMEN AND PELVIS WITH CONTRAST TECHNIQUE: Multidetector CT imaging of the abdomen and pelvis was performed using the standard protocol following bolus administration of intravenous contrast. CONTRAST:  100 cc Omnipaque 300 intravenous COMPARISON:  MRI of the lumbar spine from the same day FINDINGS: Lower chest:  Coronary calcification.  No acute finding Hepatobiliary: No focal liver abnormality.Single stone within the gallbladder which is full but shows no inflammatory changes.  Mild prominence of the biliary tree without calcified choledocholithiasis Pancreas: Unremarkable. Spleen: Unremarkable. 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 No hydronephrosis or stone. Subcentimeter low-densities in the left kidney, too small for densitometry. Unremarkable bladder. Stomach/Bowel:  No obstruction. No appendicitis. Vascular/Lymphatic: No acute vascular abnormality. Extensive atherosclerotic calcification of the aorta. No mass or adenopathy. Reproductive:Probable central prostate nodule.  No acute finding. Other: No ascites or pneumoperitoneum. Musculoskeletal: L2-3 and L3-4 discitis osteomyelitis. There has been L3-4 PLIF. The surrounding paravertebral fat is edematous. The pedicle screws at L3 and L4 show halo in/loosening. No evident collection. There is subsequent MRI available. IMPRESSION: 1. L2-3 and L3-4 discitis osteomyelitis which involves L3-4 PLIF hardware. 2. No evidence of colitis or other inflammation to explain diarrhea. 3. Cholelithiasis. Electronically Signed   By: Marnee Spring M.D.   On: 06/12/2018 18:13   [place] Ekg Site Rite  Result Date: 06/14/2018 If Site Rite image not attached, placement could not be confirmed due to current cardiac rhythm.  Ir Lumbar Disc Aspiration W/img Guide  Result Date: 06/14/2018 INDICATION: L2-3 discitis EXAM: L2-3 DISC ASPIRATION MEDICATIONS: The patient is currently admitted to the hospital and receiving intravenous antibiotics. The antibiotics were administered within an appropriate time frame prior to the initiation of the procedure. ANESTHESIA/SEDATION: Fentanyl 100 mcg IV; Versed 2 mg IV Moderate Sedation Time:  10 minutes The patient was continuously monitored during the procedure by the interventional radiology nurse under my direct supervision. COMPLICATIONS: None immediate. PROCEDURE: Informed written consent was obtained from the patient after a thorough discussion of the procedural risks, benefits and  alternatives. All questions were addressed. Maximal Sterile Barrier Technique was utilized including caps, mask, sterile gowns, sterile gloves, sterile drape, hand hygiene and skin antiseptic. A timeout was performed prior to the initiation of the procedure. The back was prepped and draped in a sterile fashion. 1% lidocaine was utilized for local anesthesia. Under fluoroscopic guidance, an 18 gauge needle was advanced into the L2-3 disc  via right posterolateral approach. Two drops gray pus was aspirated. FINDINGS: Images document needle placement in the L2-3 disc. IMPRESSION: Successful L2-3 disc aspiration yielding 2 drops great pus. Electronically Signed   By: Jolaine Click M.D.   On: 06/14/2018 13:30    Lab Data:  CBC: Recent Labs  Lab 06/14/18 0547 06/16/18 0415 06/18/18 0326 06/20/18 0411  WBC 8.2 8.4 7.8 8.5  HGB 11.3* 10.9* 11.4* 11.7*  HCT 35.1* 33.8* 35.8* 36.2*  MCV 96.2 95.5 95.5 95.8  PLT 278 294 369 380   Basic Metabolic Panel: Recent Labs  Lab 06/14/18 0547 06/16/18 0415 06/18/18 0326 06/19/18 0330 06/20/18 0411  NA 137 137 138  --  137  K 3.9 3.7 3.8  --  4.2  CL 104 103 102  --  100  CO2 22 29 29   --  30  GLUCOSE 124* 133* 113*  --  153*  BUN 11 9 7*  --  11  CREATININE 0.86 0.78 0.76 0.88 0.83  CALCIUM 8.9 8.9 8.9  --  9.3   GFR: Estimated Creatinine Clearance: 81.2 mL/min (by C-G formula based on SCr of 0.83 mg/dL). Liver Function Tests: No results for input(s): AST, ALT, ALKPHOS, BILITOT, PROT, ALBUMIN in the last 168 hours. No results for input(s): LIPASE, AMYLASE in the last 168 hours. No results for input(s): AMMONIA in the last 168 hours. Coagulation Profile: Recent Labs  Lab 06/14/18 0547  INR 1.05   Cardiac Enzymes: No results for input(s): CKTOTAL, CKMB, CKMBINDEX, TROPONINI in the last 168 hours. BNP (last 3 results) No results for input(s): PROBNP in the last 8760 hours. HbA1C: No results for input(s): HGBA1C in the last 72  hours. CBG: Recent Labs  Lab 06/19/18 1140 06/19/18 1656 06/19/18 2101 06/20/18 0835 06/20/18 1133  GLUCAP 146* 144* 152* 118* 179*   Lipid Profile: No results for input(s): CHOL, HDL, LDLCALC, TRIG, CHOLHDL, LDLDIRECT in the last 72 hours. Thyroid Function Tests: No results for input(s): TSH, T4TOTAL, FREET4, T3FREE, THYROIDAB in the last 72 hours. Anemia Panel: No results for input(s): VITAMINB12, FOLATE, FERRITIN, TIBC, IRON, RETICCTPCT in the last 72 hours. Urine analysis:    Component Value Date/Time   COLORURINE YELLOW 06/12/2018 2320   APPEARANCEUR CLEAR 06/12/2018 2320   LABSPEC >1.046 (H) 06/12/2018 2320   PHURINE 5.0 06/12/2018 2320   GLUCOSEU NEGATIVE 06/12/2018 2320   HGBUR NEGATIVE 06/12/2018 2320   BILIRUBINUR NEGATIVE 06/12/2018 2320   KETONESUR NEGATIVE 06/12/2018 2320   PROTEINUR NEGATIVE 06/12/2018 2320   UROBILINOGEN 0.2 05/07/2015 1345   NITRITE NEGATIVE 06/12/2018 2320   LEUKOCYTESUR NEGATIVE 06/12/2018 2320     Renisha Cockrum M.D. [facility] 06/20/2018, 1:41 PM  Pager: [phone] Between 7am to 7pm - call Pager - [phone]  After 7pm go to [email or web address] - password [email or web address]  Call night coverage person covering after 7pm

## 2018-06-20 NOTE — Plan of Care (Signed)
  Problem: Pain Managment: Goal: General experience of comfort will improve Outcome: Progressing   

## 2018-06-20 NOTE — Progress Notes (Signed)
5am Vanc dose found at 8:45 am with approximately 100 mLs left in the bag. Consulted with pharmacy, will infuse remaining vanc and pharmacist will adjust next dose time.

## 2018-06-20 NOTE — Anesthesia Preprocedure Evaluation (Addendum)
Anesthesia Evaluation  Patient identified by MRN, date of birth, ID band Patient awake    Reviewed: Allergy & Precautions, NPO status , Patient's Chart, lab work & pertinent test results, reviewed documented beta blocker date and time   History of Anesthesia Complications (+) history of anesthetic complications  Airway Mallampati: I  TM Distance: >3 FB Neck ROM: Full    Dental no notable dental hx. (+) Teeth Intact, Caps, Dental Advisory Given   Pulmonary former smoker,    Pulmonary exam normal breath sounds clear to auscultation       Cardiovascular hypertension, Pt. on medications and Pt. on home beta blockers + angina with exertion + CAD, + Past MI, + Cardiac Stents and + CABG  Normal cardiovascular exam Rhythm:Regular Rate:Normal  MI 06/2017 EKG 12/14/2017- Sinus tachycardia, old I[facility] Echo 12/04/17-Left ventricle: The cavity size was normal. Wall thickness was normal. Systolic function was normal. The estimated ejection fraction was in the range of 55% to 60%. Wall motion was normal; there were no regional wall motion abnormalities. Doppler parameters are consistent with abnormal left ventricular relaxation (grade 1 diastolic dysfunction). The E/e&' ratio is >15, suggesting elevated LV filling pressure. - Aortic valve: Calcified leaflets. Trileaflet. There was no   stenosis. - Mitral valve: Calcified annulus. Mildly thickened leaflets . - Left atrium: The atrium was mildly dilated. - Right atrium: The atrium was mildly dilated. - Tricuspid valve: There was mild regurgitation. - Pulmonary arteries: PA peak pressure: 40 mm Hg (S). - Inferior vena cava: The vessel was normal in size. The   respirophasic diameter changes were in the normal range (>= 50%), consistent with normal central venous pressure.  Impressions:  - LVEF 55-60%, normal wall thickness, normal wall motion, grade I DD, elevated LV filling pressure, trivial MR,  mild biatrial enlargement, mild TR, RVSP 40 mmHg, normal IVC  S/P CABG 4v 2001 S/P Stents x 5 , 4 prior to CABG, most recent one 06/2016   Neuro/Psych Left cubital tunnel syndrome Right eye residual deficit Bilateral HOH   Neuromuscular disease CVA, Residual Symptoms negative psych ROS   GI/Hepatic negative GI ROS, Neg liver ROS,   Endo/Other  diabetes, Well Controlled, Type 2, Oral Hypoglycemic AgentsHyperlipidemia  Renal/GU Hx/o Renal calculi   BPH    Musculoskeletal  (+) Arthritis , Osteoarthritis,  Cervical spinal stenosis Lumbar radiculopathy 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,L3 and L4, Discitis L2-3 and L3-4   Abdominal   Peds  Hematology  (+) anemia , Plavix therapy- last dose 06/11/18- Lovenox bridge- last dose   Anesthesia Other Findings   Reproductive/Obstetrics                           Anesthesia Physical Anesthesia Plan  ASA: III  Anesthesia Plan: General   Post-op Pain Management:    Induction: Intravenous  PONV Risk Score and Plan: 3 and Ondansetron, Dexamethasone and Treatment may vary due to age or medical condition  Airway Management Planned: Oral ETT  Additional Equipment:   Intra-op Plan:   Post-operative Plan: Extubation in OR  Informed Consent: I have reviewed the patients History and Physical, chart, labs and discussed the procedure including the risks, benefits and alternatives for the proposed anesthesia with the patient or authorized representative who has indicated his/her understanding and acceptance.   Dental advisory given  Plan Discussed with: CRNA, Surgeon and Anesthesiologist  Anesthesia Plan Comments:       Anesthesia Quick Evaluation

## 2018-06-21 ENCOUNTER — Inpatient Hospital Stay (HOSPITAL_COMMUNITY): Payer: Medicare HMO | Admitting: Anesthesiology

## 2018-06-21 ENCOUNTER — Inpatient Hospital Stay (HOSPITAL_COMMUNITY)
Admission: EM | Disposition: A | Discharge status: Home Health | Payer: Self-pay | Source: Home / Self Care | Attending: Internal Medicine

## 2018-06-21 ENCOUNTER — Inpatient Hospital Stay (HOSPITAL_COMMUNITY): Payer: Medicare HMO

## 2018-06-21 DIAGNOSIS — Z419 Encounter for procedure for purposes other than remedying health state, unspecified: Secondary | ICD-10-CM

## 2018-06-21 DIAGNOSIS — M4646 Discitis, unspecified, lumbar region: Secondary | ICD-10-CM

## 2018-06-21 DIAGNOSIS — Z9889 Other specified postprocedural states: Secondary | ICD-10-CM

## 2018-06-21 DIAGNOSIS — T84498A Other mechanical complication of other internal orthopedic devices, implants and grafts, initial encounter: Secondary | ICD-10-CM

## 2018-06-21 DIAGNOSIS — M4620 Osteomyelitis of vertebra, site unspecified: Secondary | ICD-10-CM

## 2018-06-21 LAB — CBC
HCT: 37.7 % — ABNORMAL LOW (ref 39.0–52.0)
HEMOGLOBIN: 11.9 g/dL — AB (ref 13.0–17.0)
MCH: 30.5 pg (ref 26.0–34.0)
MCHC: 31.6 g/dL (ref 30.0–36.0)
MCV: 96.7 fL (ref 80.0–100.0)
NRBC: 0 % (ref 0.0–0.2)
Platelets: 384 10*3/uL (ref 150–400)
RBC: 3.9 MIL/uL — ABNORMAL LOW (ref 4.22–5.81)
RDW: 13.6 % (ref 11.5–15.5)
WBC: 8.7 10*3/uL (ref 4.0–10.5)

## 2018-06-21 LAB — GLUCOSE, CAPILLARY
GLUCOSE-CAPILLARY: 171 mg/dL — AB (ref 70–99)
GLUCOSE-CAPILLARY: 173 mg/dL — AB (ref 70–99)
Glucose-Capillary: 153 mg/dL — ABNORMAL HIGH (ref 70–99)

## 2018-06-21 LAB — BASIC METABOLIC PANEL
ANION GAP: 9 (ref 5–15)
BUN: 13 mg/dL (ref 8–23)
CALCIUM: 9.5 mg/dL (ref 8.9–10.3)
CHLORIDE: 100 mmol/L (ref 98–111)
CO2: 30 mmol/L (ref 22–32)
Creatinine, Ser: 0.86 mg/dL (ref 0.61–1.24)
GFR calc non Af Amer: >60 mL/min (ref >60)
Glucose, Bld: 154 mg/dL — ABNORMAL HIGH (ref 70–99)
Potassium: 4.2 mmol/L (ref 3.5–5.1)
Sodium: 139 mmol/L (ref 135–145)

## 2018-06-21 LAB — SURGICAL PCR SCREEN
MRSA, PCR: NEGATIVE
STAPHYLOCOCCUS AUREUS: NEGATIVE

## 2018-06-21 SURGERY — POSTERIOR LUMBAR FUSION 2 WITH HARDWARE REMOVAL
Anesthesia: General | Site: Back

## 2018-06-21 MED ORDER — ONDANSETRON HCL 4 MG/2ML IJ SOLN
INTRAMUSCULAR | Status: DC | PRN
  Administered 2018-06-21: 4 mg via INTRAVENOUS

## 2018-06-21 MED ORDER — GABAPENTIN 300 MG PO CAPS
300.0000 mg | ORAL_CAPSULE | Freq: Two times a day (BID) | ORAL | Status: DC
  Administered 2018-06-21 – 2018-06-26 (×11): 300 mg via ORAL
  Filled 2018-06-21 (×11): qty 1

## 2018-06-21 MED ORDER — BACITRACIN-NEOMYCIN-POLYMYXIN 400-5-5000 EX OINT
TOPICAL_OINTMENT | CUTANEOUS | Status: AC
  Filled 2018-06-21: qty 1

## 2018-06-21 MED ORDER — BUPIVACAINE HCL 0.5 % IJ SOLN
INTRAMUSCULAR | Status: AC
  Filled 2018-06-21: qty 1

## 2018-06-21 MED ORDER — DEXAMETHASONE SODIUM PHOSPHATE 10 MG/ML IJ SOLN
INTRAMUSCULAR | Status: DC | PRN
  Administered 2018-06-21: 4 mg via INTRAVENOUS

## 2018-06-21 MED ORDER — SODIUM CHLORIDE 0.9% FLUSH
3.0000 mL | Freq: Two times a day (BID) | INTRAVENOUS | Status: DC
  Administered 2018-06-22 – 2018-06-25 (×2): 3 mL via INTRAVENOUS

## 2018-06-21 MED ORDER — PHENYLEPHRINE 40 MCG/ML (10ML) SYRINGE FOR IV PUSH (FOR BLOOD PRESSURE SUPPORT)
PREFILLED_SYRINGE | INTRAVENOUS | Status: AC
  Filled 2018-06-21: qty 10

## 2018-06-21 MED ORDER — FLEET ENEMA 7-19 GM/118ML RE ENEM
1.0000 | ENEMA | Freq: Once | RECTAL | Status: DC | PRN
  Filled 2018-06-21: qty 1

## 2018-06-21 MED ORDER — 0.9 % SODIUM CHLORIDE (POUR BTL) OPTIME
TOPICAL | Status: DC | PRN
  Administered 2018-06-21: 1000 mL

## 2018-06-21 MED ORDER — VANCOMYCIN HCL 1000 MG IV SOLR
INTRAVENOUS | Status: AC
  Filled 2018-06-21: qty 1000

## 2018-06-21 MED ORDER — OXYCODONE HCL 5 MG PO TABS
10.0000 mg | ORAL_TABLET | ORAL | Status: DC | PRN
  Administered 2018-06-21 – 2018-06-25 (×4): 10 mg via ORAL
  Filled 2018-06-21 (×4): qty 2

## 2018-06-21 MED ORDER — ACETAMINOPHEN 325 MG PO TABS: 650.0000 mg | ORAL_TABLET | ORAL | Status: DC | PRN

## 2018-06-21 MED ORDER — HYDROMORPHONE HCL 1 MG/ML IJ SOLN
0.2500 mg | INTRAMUSCULAR | Status: DC | PRN
  Administered 2018-06-21: 0.5 mg via INTRAVENOUS

## 2018-06-21 MED ORDER — THROMBIN (RECOMBINANT) 20000 UNITS EX SOLR
CUTANEOUS | Status: AC
  Filled 2018-06-21: qty 20000

## 2018-06-21 MED ORDER — LIDOCAINE HCL (CARDIAC) PF 100 MG/5ML IV SOSY
PREFILLED_SYRINGE | INTRAVENOUS | Status: DC | PRN
  Administered 2018-06-21: 80 mg via INTRAVENOUS

## 2018-06-21 MED ORDER — PROPOFOL 1000 MG/100ML IV EMUL
INTRAVENOUS | Status: AC
  Filled 2018-06-21: qty 100

## 2018-06-21 MED ORDER — ACETAMINOPHEN 650 MG RE SUPP: 650.0000 mg | RECTAL | Status: DC | PRN

## 2018-06-21 MED ORDER — SODIUM CHLORIDE 0.9 % IV SOLN: 250.0000 mL | INTRAVENOUS | Status: DC

## 2018-06-21 MED ORDER — BISACODYL 5 MG PO TBEC: 5.0000 mg | DELAYED_RELEASE_TABLET | Freq: Every day | ORAL | Status: DC | PRN

## 2018-06-21 MED ORDER — FENTANYL CITRATE (PF) 100 MCG/2ML IJ SOLN
INTRAMUSCULAR | Status: DC | PRN
  Administered 2018-06-21: 100 ug via INTRAVENOUS
  Administered 2018-06-21 (×2): 25 ug via INTRAVENOUS

## 2018-06-21 MED ORDER — MENTHOL 3 MG MT LOZG: 1.0000 | LOZENGE | OROMUCOSAL | Status: DC | PRN

## 2018-06-21 MED ORDER — THROMBIN 20000 UNITS EX SOLR
CUTANEOUS | Status: DC | PRN
  Administered 2018-06-21: 12:00:00 via TOPICAL

## 2018-06-21 MED ORDER — METHOCARBAMOL 500 MG PO TABS
500.0000 mg | ORAL_TABLET | Freq: Four times a day (QID) | ORAL | Status: DC | PRN
  Administered 2018-06-21 – 2018-06-24 (×5): 500 mg via ORAL
  Filled 2018-06-21 (×6): qty 1

## 2018-06-21 MED ORDER — BUPIVACAINE HCL 0.5 % IJ SOLN
INTRAMUSCULAR | Status: DC | PRN
  Administered 2018-06-21: 10 mL

## 2018-06-21 MED ORDER — FENTANYL CITRATE (PF) 250 MCG/5ML IJ SOLN
INTRAMUSCULAR | Status: AC
  Filled 2018-06-21: qty 5

## 2018-06-21 MED ORDER — DOCUSATE SODIUM 100 MG PO CAPS
100.0000 mg | ORAL_CAPSULE | Freq: Two times a day (BID) | ORAL | Status: DC
  Administered 2018-06-21 – 2018-06-26 (×11): 100 mg via ORAL
  Filled 2018-06-21 (×11): qty 1

## 2018-06-21 MED ORDER — PHENYLEPHRINE 40 MCG/ML (10ML) SYRINGE FOR IV PUSH (FOR BLOOD PRESSURE SUPPORT)
PREFILLED_SYRINGE | INTRAVENOUS | Status: DC | PRN
  Administered 2018-06-21 (×3): 80 ug via INTRAVENOUS

## 2018-06-21 MED ORDER — MORPHINE SULFATE (PF) 2 MG/ML IV SOLN
1.0000 mg | INTRAVENOUS | Status: DC | PRN
  Administered 2018-06-22 – 2018-06-23 (×2): 1 mg via INTRAVENOUS
  Filled 2018-06-21 (×2): qty 1

## 2018-06-21 MED ORDER — ONDANSETRON HCL 4 MG/2ML IJ SOLN
INTRAMUSCULAR | Status: AC
  Filled 2018-06-21: qty 2

## 2018-06-21 MED ORDER — PROPOFOL 10 MG/ML IV BOLUS
INTRAVENOUS | Status: DC | PRN
  Administered 2018-06-21: 120 mg via INTRAVENOUS

## 2018-06-21 MED ORDER — ASPIRIN EC 81 MG PO TBEC
81.0000 mg | DELAYED_RELEASE_TABLET | Freq: Every day | ORAL | Status: DC
  Administered 2018-06-21 – 2018-06-26 (×6): 81 mg via ORAL
  Filled 2018-06-21 (×6): qty 1

## 2018-06-21 MED ORDER — HYDROCODONE-ACETAMINOPHEN 7.5-325 MG PO TABS
1.0000 | ORAL_TABLET | Freq: Four times a day (QID) | ORAL | Status: DC
  Administered 2018-06-21 – 2018-06-26 (×18): 1 via ORAL
  Filled 2018-06-21 (×18): qty 1

## 2018-06-21 MED ORDER — SUGAMMADEX SODIUM 200 MG/2ML IV SOLN
INTRAVENOUS | Status: DC | PRN
  Administered 2018-06-21: 200 mg via INTRAVENOUS

## 2018-06-21 MED ORDER — POLYETHYLENE GLYCOL 3350 17 G PO PACK
17.0000 g | PACK | Freq: Every day | ORAL | Status: DC | PRN
  Administered 2018-06-22: 17 g via ORAL

## 2018-06-21 MED ORDER — PROPOFOL 10 MG/ML IV BOLUS
INTRAVENOUS | Status: AC
  Filled 2018-06-21: qty 20

## 2018-06-21 MED ORDER — LIDOCAINE 2% (20 MG/ML) 5 ML SYRINGE
INTRAMUSCULAR | Status: AC
  Filled 2018-06-21: qty 10

## 2018-06-21 MED ORDER — SODIUM CHLORIDE 0.9 % IV SOLN
INTRAVENOUS | Status: DC | PRN
  Administered 2018-06-21: 13:00:00

## 2018-06-21 MED ORDER — SUCCINYLCHOLINE CHLORIDE 200 MG/10ML IV SOSY
PREFILLED_SYRINGE | INTRAVENOUS | Status: AC
  Filled 2018-06-21: qty 10

## 2018-06-21 MED ORDER — VANCOMYCIN HCL 1000 MG IV SOLR
INTRAVENOUS | Status: DC | PRN
  Administered 2018-06-21: 1000 mg

## 2018-06-21 MED ORDER — SODIUM CHLORIDE 0.9 % IV SOLN
INTRAVENOUS | Status: DC | PRN
  Administered 2018-06-21: 500 mL via INTRAVENOUS

## 2018-06-21 MED ORDER — ROCURONIUM BROMIDE 10 MG/ML (PF) SYRINGE
PREFILLED_SYRINGE | INTRAVENOUS | Status: DC | PRN
  Administered 2018-06-21 (×3): 10 mg via INTRAVENOUS
  Administered 2018-06-21: 50 mg via INTRAVENOUS

## 2018-06-21 MED ORDER — SODIUM CHLORIDE 0.9 % IV SOLN
Freq: Once | INTRAVENOUS | Status: DC
  Filled 2018-06-21: qty 1

## 2018-06-21 MED ORDER — FAMOTIDINE IN NACL 20-0.9 MG/50ML-% IV SOLN
20.0000 mg | Freq: Two times a day (BID) | INTRAVENOUS | Status: DC
  Administered 2018-06-21 – 2018-06-23 (×5): 20 mg via INTRAVENOUS
  Filled 2018-06-21 (×5): qty 50

## 2018-06-21 MED ORDER — VITAMIN D 1000 UNITS PO TABS
5000.0000 [IU] | ORAL_TABLET | Freq: Every day | ORAL | Status: DC
  Administered 2018-06-21 – 2018-06-26 (×6): 5000 [IU] via ORAL
  Filled 2018-06-21 (×9): qty 5

## 2018-06-21 MED ORDER — EPHEDRINE 5 MG/ML INJ
INTRAVENOUS | Status: AC
  Filled 2018-06-21: qty 10

## 2018-06-21 MED ORDER — ROCURONIUM BROMIDE 50 MG/5ML IV SOSY
PREFILLED_SYRINGE | INTRAVENOUS | Status: AC
  Filled 2018-06-21: qty 10

## 2018-06-21 MED ORDER — LACTATED RINGERS IV SOLN
INTRAVENOUS | Status: DC | PRN
  Administered 2018-06-21 (×2): via INTRAVENOUS

## 2018-06-21 MED ORDER — VANCOMYCIN HCL 500 MG IV SOLR
INTRAVENOUS | Status: AC
  Filled 2018-06-21: qty 500

## 2018-06-21 MED ORDER — SODIUM CHLORIDE 0.9 % IV SOLN
INTRAVENOUS | Status: DC | PRN
  Administered 2018-06-21: 40 ug/min via INTRAVENOUS

## 2018-06-21 MED ORDER — SODIUM CHLORIDE 0.9% FLUSH: 3.0000 mL | INTRAVENOUS | Status: DC | PRN

## 2018-06-21 MED ORDER — MIDAZOLAM HCL 2 MG/2ML IJ SOLN
INTRAMUSCULAR | Status: AC
  Filled 2018-06-21: qty 2

## 2018-06-21 MED ORDER — ONDANSETRON HCL 4 MG/2ML IJ SOLN: 4.0000 mg | Freq: Once | INTRAMUSCULAR | Status: DC | PRN

## 2018-06-21 MED ORDER — HYDROMORPHONE HCL 1 MG/ML IJ SOLN
INTRAMUSCULAR | Status: AC
  Filled 2018-06-21: qty 1

## 2018-06-21 MED ORDER — METHOCARBAMOL 1000 MG/10ML IJ SOLN
500.0000 mg | Freq: Four times a day (QID) | INTRAVENOUS | Status: DC | PRN
  Filled 2018-06-21: qty 5

## 2018-06-21 MED ORDER — ONDANSETRON HCL 4 MG PO TABS: 4.0000 mg | ORAL_TABLET | Freq: Four times a day (QID) | ORAL | Status: DC | PRN

## 2018-06-21 MED ORDER — ONDANSETRON HCL 4 MG/2ML IJ SOLN: 4.0000 mg | Freq: Four times a day (QID) | INTRAMUSCULAR | Status: DC | PRN

## 2018-06-21 MED ORDER — BUPIVACAINE LIPOSOME 1.3 % IJ SUSP
INTRAMUSCULAR | Status: DC | PRN
  Administered 2018-06-21: 10 mL

## 2018-06-21 MED ORDER — PHENOL 1.4 % MT LIQD: 1.0000 | OROMUCOSAL | Status: DC | PRN

## 2018-06-21 MED ORDER — BUPIVACAINE LIPOSOME 1.3 % IJ SUSP
20.0000 mL | Freq: Once | INTRAMUSCULAR | Status: DC
  Filled 2018-06-21: qty 20

## 2018-06-21 MED ORDER — EPHEDRINE SULFATE-NACL 50-0.9 MG/10ML-% IV SOSY
PREFILLED_SYRINGE | INTRAVENOUS | Status: DC | PRN
  Administered 2018-06-21 (×2): 15 mg via INTRAVENOUS
  Administered 2018-06-21: 10 mg via INTRAVENOUS

## 2018-06-21 MED ORDER — MEPERIDINE HCL 50 MG/ML IJ SOLN: 6.2500 mg | INTRAMUSCULAR | Status: DC | PRN

## 2018-06-21 MED ORDER — SODIUM CHLORIDE 0.9 % IV SOLN
INTRAVENOUS | Status: DC
  Administered 2018-06-21 – 2018-06-25 (×4): via INTRAVENOUS

## 2018-06-21 MED ORDER — DEXAMETHASONE SODIUM PHOSPHATE 10 MG/ML IJ SOLN
INTRAMUSCULAR | Status: AC
  Filled 2018-06-21: qty 2

## 2018-06-21 SURGICAL SUPPLY — 64 items
ADH SKN CLS APL DERMABOND .7 (GAUZE/BANDAGES/DRESSINGS) ×1
BLADE CLIPPER SURG (BLADE) IMPLANT
BUR MATCHSTICK NEURO 3.0 LAGG (BURR) ×3 IMPLANT
BUR RND FLUTED 2.5 (BURR) IMPLANT
CONT SPEC 4OZ CLIKSEAL STRL BL (MISCELLANEOUS) ×3 IMPLANT
COVER BACK TABLE 80X110 HD (DRAPES) ×3 IMPLANT
COVER MAYO STAND STRL (DRAPES) ×6 IMPLANT
COVER SURGICAL LIGHT HANDLE (MISCELLANEOUS) ×3 IMPLANT
COVER WAND RF STERILE (DRAPES) ×3 IMPLANT
DECANTER SPIKE VIAL GLASS SM (MISCELLANEOUS) ×3 IMPLANT
DERMABOND ADVANCED (GAUZE/BANDAGES/DRESSINGS) ×2
DERMABOND ADVANCED .7 DNX12 (GAUZE/BANDAGES/DRESSINGS) ×1 IMPLANT
DRAPE C-ARM 42X72 X-RAY (DRAPES) ×6 IMPLANT
DRAPE MICROSCOPE LEICA (MISCELLANEOUS) IMPLANT
DRAPE SURG 17X23 STRL (DRAPES) ×12 IMPLANT
DRSG MEPILEX BORDER 4X4 (GAUZE/BANDAGES/DRESSINGS) IMPLANT
DRSG MEPILEX BORDER 4X8 (GAUZE/BANDAGES/DRESSINGS) ×3 IMPLANT
DURAPREP 26ML APPLICATOR (WOUND CARE) ×3 IMPLANT
ELECT BLADE 6.5 EXT (BLADE) IMPLANT
ELECT CAUTERY BLADE 6.4 (BLADE) ×3 IMPLANT
ELECT REM PT RETURN 9FT ADLT (ELECTROSURGICAL) ×3
ELECTRODE REM PT RTRN 9FT ADLT (ELECTROSURGICAL) ×1 IMPLANT
EVACUATOR 1/8 PVC DRAIN (DRAIN) ×3 IMPLANT
EXTRACTOR BROKEN SCREW 3 (INSTRUMENTS) ×3 IMPLANT
GAUZE SPONGE 4X4 12PLY STRL LF (GAUZE/BANDAGES/DRESSINGS) ×3 IMPLANT
GLOVE BIOGEL PI IND STRL 8 (GLOVE) ×1 IMPLANT
GLOVE BIOGEL PI INDICATOR 8 (GLOVE) ×2
GLOVE ECLIPSE 9.0 STRL (GLOVE) ×3 IMPLANT
GLOVE ORTHO TXT STRL SZ7.5 (GLOVE) ×3 IMPLANT
GLOVE SURG 8.5 LATEX PF (GLOVE) ×6 IMPLANT
GOWN STRL REUS W/ TWL LRG LVL3 (GOWN DISPOSABLE) ×1 IMPLANT
GOWN STRL REUS W/TWL 2XL LVL3 (GOWN DISPOSABLE) ×6 IMPLANT
GOWN STRL REUS W/TWL LRG LVL3 (GOWN DISPOSABLE) ×3
IV CATH 14GX2 1/4 (CATHETERS) ×3 IMPLANT
KIT BASIN OR (CUSTOM PROCEDURE TRAY) ×3 IMPLANT
KIT POSITION SURG JACKSON T1 (MISCELLANEOUS) ×3 IMPLANT
KIT STIMULAN RAPID CURE  10CC (Orthopedic Implant) ×2 IMPLANT
KIT STIMULAN RAPID CURE 10CC (Orthopedic Implant) ×1 IMPLANT
KIT TURNOVER KIT B (KITS) ×3 IMPLANT
NEEDLE 22X1 1/2 (OR ONLY) (NEEDLE) ×3 IMPLANT
NEEDLE RANFAC BLUNT 8X15 (NEEDLE) IMPLANT
NEEDLE SPNL 18GX3.5 QUINCKE PK (NEEDLE) ×3 IMPLANT
NS IRRIG 1000ML POUR BTL (IV SOLUTION) ×3 IMPLANT
PACK LAMINECTOMY ORTHO (CUSTOM PROCEDURE TRAY) ×3 IMPLANT
PAD ARMBOARD 7.5X6 YLW CONV (MISCELLANEOUS) ×6 IMPLANT
PATTIES SURGICAL .75X.75 (GAUZE/BANDAGES/DRESSINGS) ×3 IMPLANT
SPONGE LAP 4X18 RFD (DISPOSABLE) ×3 IMPLANT
SPONGE SURGIFOAM ABS GEL 100 (HEMOSTASIS) IMPLANT
STAPLER VISISTAT 35W (STAPLE) ×3 IMPLANT
SUT VIC AB 0 CT1 27 (SUTURE) ×3
SUT VIC AB 0 CT1 27XBRD ANBCTR (SUTURE) ×1 IMPLANT
SUT VIC AB 1 CTX 36 (SUTURE) ×6
SUT VIC AB 1 CTX36XBRD ANBCTR (SUTURE) ×2 IMPLANT
SUT VIC AB 2-0 CT1 27 (SUTURE) ×3
SUT VIC AB 2-0 CT1 TAPERPNT 27 (SUTURE) ×1 IMPLANT
SUT VIC AB 3-0 X1 27 (SUTURE) ×3 IMPLANT
SYR 20CC LL (SYRINGE) ×3 IMPLANT
SYR CONTROL 10ML LL (SYRINGE) ×6 IMPLANT
TAPE CLOTH SURG 4X10 WHT LF (GAUZE/BANDAGES/DRESSINGS) ×3 IMPLANT
TOWEL GREEN STERILE (TOWEL DISPOSABLE) ×3 IMPLANT
TOWEL GREEN STERILE FF (TOWEL DISPOSABLE) ×3 IMPLANT
TRAY FOLEY MTR SLVR 16FR STAT (SET/KITS/TRAYS/PACK) ×3 IMPLANT
WATER STERILE IRR 1000ML POUR (IV SOLUTION) ×3 IMPLANT
YANKAUER SUCT BULB TIP NO VENT (SUCTIONS) ×3 IMPLANT

## 2018-06-21 NOTE — Care Management Note (Signed)
Case Management Note Previous note created by Isidoro Donning  Patient Details  Name: CARON TARDIF MRN: [id number] Date of Birth: Aug 08, 1935  Subjective/Objective:     L2-L3, L3-L4 discitis, osteomyelitis with hardware involement               Action/Plan: NCM spoke to pt, wife and oldest dtr at bedside. [facility] following for IV abx. Pt has RW and 3n1 bedside commode at home. Pt interested in IP rehab vs HH. Will continue to follow for dc needs. Pt is a possible surgery to remove hardware.   Expected Discharge Date:                  Expected Discharge Plan:  Home w Home Health Services  In-House Referral:  NA  Discharge planning Services  CM Consult  Post Acute Care Choice:  Home Health Choice offered to:  Patient  DME Arranged:  N/A DME Agency:  NA  HH Arranged:  RN, IV Antibiotics, PT, Nurse's Aide HH Agency:  [facility]  Status of Service:  In process, will continue to follow  If discussed at Long Length of Stay Meetings, dates discussed:    Additional Comments: CM spoke with pts wife - wife confirmed they are interested in [facility] for [facility] and IV antibiotics - [facility] contacted and confimed they have accepted referral.  Wife also confirmed pt already has RW and 3:1 in the home.  CM will continue to follow for discharge needs Cherylann Parr, RN 06/21/2018, 5:02 PM

## 2018-06-21 NOTE — Progress Notes (Signed)
[facility]                                                                              Patient Demographics  Jeffrey Nelson, is a 83 y.o. male, DOB - October 28, 1934, ZOX:[id number]  Admit date - 06/12/2018   Admitting Physician Mauricio Annett Gula, MD  Outpatient Primary MD for the patient is Ryter-Brown, Fritzi Mandes, MD  Outpatient specialists:   LOS - 9  days   Medical records reviewed and are as summarized below:    Chief Complaint  Patient presents with  . Back Pain       Brief summary   83 y.o.malewith medical history significant ofcompression fracture of L3 lumbar vertebra with nonunion, status post lumbar spinal fusion (April, 2019),type 2 diabetes mellitus, coronary artery disease status post bypass grafting and stenting, hypertension and dyslipidemia.   Patient presented to his orthopedic surgeon with complains of worsening back pain over the last 2 weeks.  He underwent lumbar spine surgery with fusion earlier this year.  Patient was sent to the emergency department by the orthopedic surgeon.  MRI was done which raise concern for osteomyelitis at L3-L4 and L2-L3.  Patient was hospitalized for further management.  Orthopedics and infectious disease consulted.   Assessment & Plan   Principal problem L2-L3, L3-L4 discitis/osteomyelitis involving the hardware -Seen by orthopedics and ID -Underwent disc aspiration by IR on 10/18, cultures negative so far -Patient placed on vancomycin and ceftriaxone, per ID plan for 6 weeks of IV antibiotics -PICC line placed -Orthopedics following, underwent removal of hardware L3-L5, exploration of fusion L3-L5, biopsy L3, placement of vancomycin antibiotic beads day  Active Problems:  Acute on chronic back pain -Continue pain control, postop today  CAD status post CABG, angioplasty Cardiac cath in 02/2017, last stent placed in November 2017, Plavix currently on hold for surgery on 10/25 No chest pain or  shortness of breath, stable  Diabetes mellitus type 2 -CBGs controlled  Constipation Continue bowel regimen, likely due to narcotics  Essential hypertension Continue beta-blocker  BPH Continue tamsulosin, finasteride   Code Status: Full CODE STATUS DVT Prophylaxis:  Lovenox  Family Communication: Discussed in detail with the patient, all imaging results, lab results explained to the patient   Disposition Plan:   Time Spent in minutes 25-minute  Procedures:  L2-L3 disc aspiration by IR on 10/18  Consultants:   Orthopedics Infectious disease IR  Antimicrobials:      Medications  Scheduled Meds: . acetaminophen  650 mg Oral Q6H  . Bacitracin 50,000 units, gentamicin 80mg  & cefazolin 1 g in normal saline ( ) mastectomy irrigation   Irrigation Once  . bupivacaine liposome  20 mL Infiltration Once  . feeding supplement (ENSURE ENLIVE)  237 mL Oral TID PC & HS  . finasteride  5 mg Oral Daily  . HYDROmorphone      . insulin aspart  0-9 Units Subcutaneous TID WC  . metoprolol tartrate  25 mg Oral BID  . pantoprazole  40 mg Oral Daily  . polyethylene glycol  17 g Oral BID  . senna  1 tablet Oral BID  . tamsulosin  0.4 mg Oral  QPC supper   Continuous Infusions: . sodium chloride 10 mL/hr at 06/18/18 0547  . cefTRIAXone (ROCEPHIN)  IV 2 g (06/20/18 1257)  . vancomycin 1,250 mg (06/20/18 2120)   PRN Meds:.cyclobenzaprine, HYDROmorphone (DILAUDID) injection, ondansetron, oxyCODONE, simethicone, sodium chloride flush, sodium phosphate, traZODone   Antibiotics   Anti-infectives (From admission, onward)   Start     Dose/Rate Route Frequency Ordered Stop   06/21/18 1238  bacitracin 50,000 units, gentamicin 80mg  & cefazolin 1 g in normal saline ( )  Status:  Discontinued       As needed 06/21/18 1238 06/21/18 1238   06/21/18 1152  vancomycin (VANCOCIN) powder  Status:  Discontinued       As needed 06/21/18 1152 06/21/18 1211   06/21/18 1045  bacitracin  50,000 Units, gentamicin (GARAMYCIN) 80 mg, ceFAZolin (ANCEF) 1 g in sodium chloride 0.9 % 1,000 mL      Irrigation Once 06/21/18 1038     06/17/18 1700  vancomycin (VANCOCIN) 1,250 mg in sodium chloride 0.9 % 250 mL IVPB     1,250 mg 166.7 mL/hr over 90 Minutes Intravenous Every 12 hours 06/17/18 1626     06/14/18 1315  vancomycin (VANCOCIN) IVPB 1000 mg/200 mL premix  Status:  Discontinued     1,000 mg 200 mL/hr over 60 Minutes Intravenous Every 12 hours 06/14/18 1303 06/17/18 1626   06/14/18 1300  cefTRIAXone (ROCEPHIN) 2 g in sodium chloride 0.9 % 100 mL IVPB     2 g 200 mL/hr over 30 Minutes Intravenous Every 24 hours 06/14/18 1258     06/13/18 0830  vancomycin (VANCOCIN) 1,250 mg in sodium chloride 0.9 % 250 mL IVPB  Status:  Discontinued     1,250 mg 166.7 mL/hr over 90 Minutes Intravenous Every 12 hours 06/12/18 2012 06/13/18 1418   06/13/18 0800  piperacillin-tazobactam (ZOSYN) IVPB 3.375 g  Status:  Discontinued     3.375 g 12.5 mL/hr over 240 Minutes Intravenous Every 8 hours 06/12/18 2012 06/13/18 1418   06/12/18 2030  piperacillin-tazobactam (ZOSYN) IVPB 3.375 g     3.375 g 100 mL/hr over 30 Minutes Intravenous  Once 06/12/18 2008 06/12/18 2320   06/12/18 2030  vancomycin (VANCOCIN) 2,000 mg in sodium chloride 0.9 % 500 mL IVPB     2,000 mg 250 mL/hr over 120 Minutes Intravenous  Once 06/12/18 2008 06/12/18 2331        Subjective:   Jeffrey Nelson was seen and examined today.  Seen after surgery, alert and oriented.  Patient denies dizziness, chest pain, shortness of breath, abdominal pain, N/V/D/C.  Objective:   Vitals:   06/21/18 1307 06/21/18 1322 06/21/18 1337 06/21/18 1352  BP: (!) 151/68 (!) 147/68 140/70 (!) 143/64  Pulse: 82 82 70 84  Resp: (!) 9 17 10 11   Temp:    (!) 97.2 F (36.2 C)  TempSrc:      SpO2: 97% 97% 92% 100%  Weight:        Intake/Output Summary (Last 24 hours) at 06/21/2018 1418 Last data filed at 06/21/2018 1356 Gross per 24 hour    Intake 3604.72 ml  Output 2240 ml  Net 1364.72 ml     Wt Readings from Last 3 Encounters:  06/21/18 94.6 kg  06/12/18 99.3 kg  05/29/18 99.3 kg     Exam    General: Alert and oriented x 3, NAD  Eyes:  HEENT:    Cardiovascular: S1 S2 clear, RRR. No pedal edema b/l  Respiratory: Clear to auscultation bilaterally  Gastrointestinal: Soft, nontender, nondistended, + bowel sounds  Ext: no pedal edema bilaterally  Neuro   Musculoskeletal: No digital cyanosis, clubbing  Skin: No rashes  Psych: flat affect alert and oriented x3     Data Reviewed:  I have personally reviewed following labs and imaging studies  Micro Results Recent Results (from the past 240 hour(s))  Blood culture (routine x 2)     Status: None   Collection Time: 06/12/18  1:28 PM  Result Value Ref Range Status   Specimen Description BLOOD LEFT ANTECUBITAL  Final   Special Requests   Final    BOTTLES DRAWN AEROBIC AND ANAEROBIC Blood Culture adequate volume   Culture   Final    NO GROWTH 5 DAYS Performed at [facility], [address] [place], [place] [address]    Report Status 06/17/2018 FINAL  Final  Blood culture (routine x 2)     Status: None   Collection Time: 06/12/18  1:29 PM  Result Value Ref Range Status   Specimen Description BLOOD RIGHT ANTECUBITAL  Final   Special Requests   Final    BOTTLES DRAWN AEROBIC AND ANAEROBIC Blood Culture adequate volume   Culture   Final    NO GROWTH 5 DAYS Performed at [facility], [address], [place], [place] [address]    Report Status 06/17/2018 FINAL  Final  Aerobic/Anaerobic Culture (surgical/deep wound)     Status: None   Collection Time: 06/14/18 10:20 AM  Result Value Ref Range Status   Specimen Description BACK  Final   Special Requests DISC  Final   Gram Stain   Final    FEW WBC PRESENT,BOTH PMN AND MONONUCLEAR NO ORGANISMS SEEN    Culture   Final    No growth aerobically or anaerobically. Performed at [facility], [address], [place], [place] [address]    Report Status 06/19/2018 FINAL  Final  Surgical pcr screen     Status: None   Collection Time: 06/20/18 11:47 PM  Result Value Ref Range Status   MRSA, PCR NEGATIVE NEGATIVE Final   Staphylococcus aureus NEGATIVE NEGATIVE Final    Comment: (NOTE) The Xpert SA Assay (FDA approved for NASAL specimens in patients 24 years of age and older), is one component of a comprehensive surveillance program. It is not intended to diagnose infection nor to guide or monitor treatment. Performed at [facility], [address], [place], [place] [address]   Aerobic/Anaerobic Culture (surgical/deep wound)     Status: None (Preliminary result)   Collection Time: 06/21/18 10:08 AM  Result Value Ref Range Status   Specimen Description BONE  Final   Special Requests NONE  Final   Gram Stain   Final    RARE WBC PRESENT, PREDOMINANTLY PMN RARE GRAM NEGATIVE RODS Performed at [facility], [address], [place], [place] [address]    Culture PENDING  Incomplete   Report Status PENDING  Incomplete    Radiology Reports Dg Lumbar Spine 2-3 Views  Result Date: 06/21/2018 CLINICAL DATA:  ELECTIVE EXAM: DG C-ARM 61-120 MIN; LUMBAR SPINE - 2-3 VIEW COMPARISON:  No recent prior. FINDINGS: Postsurgical changes noted of the lumbar spine. Levels of interbody fusion or noted. Number lumbar spine is difficult due to positioning. 0 minutes 29 seconds fluoroscopy time utilized. IMPRESSION: Postsurgical changes lumbar spine. Electronically Signed   By: Maisie Fus  Register   On: 06/21/2018 13:07   Mr Lumbar Spine Wo Contrast  Result Date: 06/17/2018 CLINICAL DATA:  Discitis osteomyelitis. History of lumbar spine surgery with hardware failure and compression fracture. EXAM: MRI LUMBAR SPINE WITHOUT CONTRAST TECHNIQUE: Multiplanar, multisequence MR imaging of the lumbar spine was performed. No intravenous contrast was administered. COMPARISON:  MRI of  the lumbar spine June 12, 2018 and CT abdomen and pelvis June 12, 2018 FINDINGS: SEGMENTATION: For the purposes of this report, the last well-formed intervertebral disc is reported as L5-S1. ALIGNMENT: Maintained lumbar lordosis. No malalignment. VERTEBRAE:Status post L3 through S1 PLIF. Redemonstration of moderate L3 fracture with approximately 50% height loss, the pedicle screws are migrated to the superior cortex, unchanged. Pedicle screw of L4 may be extending into the disc space. L3-4 subsidence and abnormal signal within the disc space at L2-3, L3-4. Mild old T12 compression fracture. CONUS MEDULLARIS AND CAUDA EQUINA: Conus medullaris terminates at T12-L1 and demonstrates normal morphology and signal characteristics. Cauda equina is normal. PARASPINAL AND OTHER SOFT TISSUES: LEFT greater than RIGHT iliopsoas myositis without focal fluid collection. Paraspinal denervation at below the level surgical intervention. DISC LEVELS: T12-L1: No axial sequences. Stable small broad-based disc bulge without canal stenosis. Mild neural foraminal narrowing. L1-2: Small broad-based disc bulge asymmetric to the RIGHT. No canal stenosis. Mild RIGHT neural foraminal narrowing. L2-3: Disc bulge. No canal stenosis. Moderate to severe RIGHT greater LEFT neural foraminal narrowing. L3-4: PLIF and posterior decompression without canal stenosis. Soft tissue effacing the neural foramen. L4-5: PLIF. Posterior decompression. No canal stenosis. Abnormal soft tissue LEFT neural foramen. L5-S1: Transitional anatomy, sacralized L5 vertebral body. No disc bulge, canal stenosis or neural foraminal narrowing. IMPRESSION: 1. Similar appearance of L2-3 and L3-4 discitis osteomyelitis. Associated iliopsoas myositis. 2. Status post L3 through L5 PLIF and posterior decompression with hardware failure. Moderate similar L2 pathologic fracture. 3. No canal stenosis. 4. Neural foraminal narrowing T12-L1 through L4-5; soft tissue effacing the  neural foramen L2-3 through L4-5 could reflect disc material or purulent material. Electronically Signed   By: Awilda Metro M.D.   On: 06/17/2018 21:39   Mr Lumbar Spine Wo Contrast  Result Date: 06/12/2018 CLINICAL DATA:  83 year old male with increased lumbar spine pain status post fusion 6 months ago. Fractures/collapse of the L3 vertebral body. EXAM: MRI LUMBAR SPINE WITHOUT CONTRAST TECHNIQUE: Multiplanar, multisequence MR imaging of the lumbar spine was performed. No intravenous contrast was administered. COMPARISON:  CT lumbar Spine [facility] 05/14/2018. Lumbar MRI 12/05/2017, and earlier. FINDINGS: Segmentation: Hypoplastic ribs assumed T12 as on the CT and April MRI numbering. Alignment: Straightening of lumbar lordosis is stable from last month. Vertebrae: Confluent marrow edema from the posterior inferior L2 body, throughout the L3 body, and into the superior and posterior L4 body. See series 7, image 9. This has progressed from the L3-L4 level endplate edema seen in April, with moderate collapse of L3 and erosion of the L3-L4 endplates since the prior MRI. Interbody implant at L3-L4. New abnormal increased T2 and STIR signal in the L2-L3 disc, with new L2 endplate and body marrow edema since April. The L1 and L5 levels appear stable and intact. Mild chronic T12 inferior endplate compression is stable. Intact visible lower thoracic levels. Intact visible sacrum and SI joints. Conus medullaris and cauda equina: Conus extends to the T12-L1 level. No lower spinal cord or conus signal abnormality. Paraspinal and other soft tissues: Confluent left greater than right medial psoas muscle edema as seen on series 6, image 13 and series 5, images 17 (on the left) and 1 (on  the right). No intramuscular fluid collection is identified. The STIR images demonstrate prominent marrow edema in the L3 transverse processes. Superimposed postoperative changes to the lower lumbar erector spinae  muscles. Negative visible abdominal viscera. Disc levels: No Lower spinal stenosis above L2. L2-L3: New abnormal increased T2 and STIR signal within the disc. Progressed circumferential disc bulge with chronic mild to moderate facet and ligament flavum hypertrophy. New mild to moderate spinal stenosis. Chronic mild to moderate L2 foraminal stenosis greater on the right. L3-L4: Posterior and interbody fusion hardware here and prior decompression. No spinal stenosis. There appears to be soft tissue edema affecting the bilateral L3 neural foramina. L4-L5: Posterior and interbody fusion hardware plus decompression here. Mild residual right L4 foraminal stenosis related to facet hypertrophy. L5-S1: Stable and negative aside from mild facet hypertrophy. IMPRESSION: 1. Constellation of imaging findings since the 12/05/2017 MRI is most compatible with prolonged Discitis Osteomyelitis at L3-L4, and extension of Discitis Osteomyelitis to L2-L3 since April. There is bilateral L3 through L5 spinal fusion hardware in place. Associated partial collapse of L3 with L3 and L4 endplate erosion. Abnormal L2-L3 disc and L2 inferior endplates now. 2. Bilateral psoas muscle edema/inflammation. No intramuscular or epidural abscess identified. 3. Multifactorial mild to moderate spinal stenosis at L2-L3 is new since April. Electronically Signed   By: Odessa Fleming M.D.   On: 06/12/2018 16:14   Nm Bone Scan 3 Phase  Result Date: 06/06/2018 CLINICAL DATA:  SI joint pain, question spondyloarthropathy, painful loosening of hardware L3, LEFT sacral pain, prior L3-L5 fusion in April 2019, assess for sacral insufficiency fracture EXAM: NUCLEAR MEDICINE 3-PHASE BONE SCAN TECHNIQUE: Radionuclide angiographic images, immediate static blood pool images, and 3-hour delayed static images were obtained of the lumbar spine, pelvis and hips after intravenous injection of radiopharmaceutical. RADIOPHARMACEUTICALS:  22 mCi Tc-32m MDP IV COMPARISON:  None  Correlation: Lumbar spine radiographs 05/29/2018, pelvic radiograph 01/17/2018 FINDINGS: Vascular phase: Normal blood flow to the hips and pelvis Blood pool phase: Normal blood pool at the pelvis and hips. Mildly increased blood pool in the lumbar region at approximately the L3-L5 Delayed phase: Delayed images show normal tracer accumulation at the hip joints and SI joints. Normal tracer localization in the pelvis and proximal femora. No scintigraphic evidence of sacral insufficiency fracture. Diffusely increased tracer localization is seen in lumbar spine at L3-L5, predominantly at L3-L4, corresponding to the surgical levels identified on recent radiographs. These findings are consistent with the relative recent age of surgery and expected postoperative uptake. IMPRESSION: Normal scintigraphic appearance of the pelvis, SI joints, sacrum, and hip joints. Increased blood pool and delayed uptake of tracer in lower lumbar spine corresponding to prior L3-L5 fusion, with greatest uptake centered at L3-L4. Degree of uptake is consistent with preceding spine surgery. Due to postsurgical uptake, unable to exclude underlying infection. Electronically Signed   By: [name] M.D.   On: 06/06/2018 13:44   Ct Abdomen Pelvis W Contrast  Result Date: 06/12/2018 CLINICAL DATA:  Left lower quadrant pain with diarrhea. EXAM: CT ABDOMEN AND PELVIS WITH CONTRAST TECHNIQUE: Multidetector CT imaging of the abdomen and pelvis was performed using the standard protocol following bolus administration of intravenous contrast. CONTRAST:  100 cc Omnipaque 300 intravenous COMPARISON:  MRI of the lumbar spine from the same day FINDINGS: Lower chest:  Coronary calcification.  No acute finding Hepatobiliary: No focal liver abnormality.Single stone within the gallbladder which is full but shows no inflammatory changes. Mild prominence of the biliary tree without calcified choledocholithiasis Pancreas:  Unremarkable. Spleen: Unremarkable.  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 No hydronephrosis or stone. Subcentimeter low-densities in the left kidney, too small for densitometry. Unremarkable bladder. Stomach/Bowel:  No obstruction. No appendicitis. Vascular/Lymphatic: No acute vascular abnormality. Extensive atherosclerotic calcification of the aorta. No mass or adenopathy. Reproductive:Probable central prostate nodule.  No acute finding. Other: No ascites or pneumoperitoneum. Musculoskeletal: L2-3 and L3-4 discitis osteomyelitis. There has been L3-4 PLIF. The surrounding paravertebral fat is edematous. The pedicle screws at L3 and L4 show halo in/loosening. No evident collection. There is subsequent MRI available. IMPRESSION: 1. L2-3 and L3-4 discitis osteomyelitis which involves L3-4 PLIF hardware. 2. No evidence of colitis or other inflammation to explain diarrhea. 3. Cholelithiasis. Electronically Signed   By: Marnee Spring M.D.   On: 06/12/2018 18:13   Dg C-arm 1-60 Min  Result Date: 06/21/2018 CLINICAL DATA:  ELECTIVE EXAM: DG C-ARM 61-120 MIN; LUMBAR SPINE - 2-3 VIEW COMPARISON:  No recent prior. FINDINGS: Postsurgical changes noted of the lumbar spine. Levels of interbody fusion or noted. Number lumbar spine is difficult due to positioning. 0 minutes 29 seconds fluoroscopy time utilized. IMPRESSION: Postsurgical changes lumbar spine. Electronically Signed   By: Maisie Fus  Register   On: 06/21/2018 13:07   [place] Ekg Site Rite  Result Date: 06/14/2018 If Site Rite image not attached, placement could not be confirmed due to current cardiac rhythm.  Ir Lumbar Disc Aspiration W/img Guide  Result Date: 06/14/2018 INDICATION: L2-3 discitis EXAM: L2-3 DISC ASPIRATION MEDICATIONS: The patient is currently admitted to the hospital and receiving intravenous antibiotics. The antibiotics were administered within an appropriate time frame prior to the initiation of the procedure. ANESTHESIA/SEDATION: Fentanyl 100 mcg IV;  Versed 2 mg IV Moderate Sedation Time:  10 minutes The patient was continuously monitored during the procedure by the interventional radiology nurse under my direct supervision. COMPLICATIONS: None immediate. PROCEDURE: Informed written consent was obtained from the patient after a thorough discussion of the procedural risks, benefits and alternatives. All questions were addressed. Maximal Sterile Barrier Technique was utilized including caps, mask, sterile gowns, sterile gloves, sterile drape, hand hygiene and skin antiseptic. A timeout was performed prior to the initiation of the procedure. The back was prepped and draped in a sterile fashion. 1% lidocaine was utilized for local anesthesia. Under fluoroscopic guidance, an 18 gauge needle was advanced into the L2-3 disc via right posterolateral approach. Two drops gray pus was aspirated. FINDINGS: Images document needle placement in the L2-3 disc. IMPRESSION: Successful L2-3 disc aspiration yielding 2 drops great pus. Electronically Signed   By: Jolaine Click M.D.   On: 06/14/2018 13:30    Lab Data:  CBC: Recent Labs  Lab 06/16/18 0415 06/18/18 0326 06/20/18 0411 06/21/18 0345  WBC 8.4 7.8 8.5 8.7  HGB 10.9* 11.4* 11.7* 11.9*  HCT 33.8* 35.8* 36.2* 37.7*  MCV 95.5 95.5 95.8 96.7  PLT 294 369 380 384   Basic Metabolic Panel: Recent Labs  Lab 06/16/18 0415 06/18/18 0326 06/19/18 0330 06/20/18 0411 06/21/18 0345  NA 137 138  --  137 139  K 3.7 3.8  --  4.2 4.2  CL 103 102  --  100 100  CO2 29 29  --  30 30  GLUCOSE 133* 113*  --  153* 154*  BUN 9 7*  --  11 13  CREATININE 0.78 0.76 0.88 0.83 0.86  CALCIUM 8.9 8.9  --  9.3 9.5   GFR: Estimated Creatinine Clearance: 78.3 mL/min (by C-G formula based  on SCr of 0.86 mg/dL). Liver Function Tests: No results for input(s): AST, ALT, ALKPHOS, BILITOT, PROT, ALBUMIN in the last 168 hours. No results for input(s): LIPASE, AMYLASE in the last 168 hours. No results for input(s): AMMONIA in  the last 168 hours. Coagulation Profile: No results for input(s): INR, PROTIME in the last 168 hours. Cardiac Enzymes: No results for input(s): CKTOTAL, CKMB, CKMBINDEX, TROPONINI in the last 168 hours. BNP (last 3 results) No results for input(s): PROBNP in the last 8760 hours. HbA1C: No results for input(s): HGBA1C in the last 72 hours. CBG: Recent Labs  Lab 06/20/18 0835 06/20/18 1133 06/20/18 1657 06/20/18 2339 06/21/18 1222  GLUCAP 118* 179* 125* 142* 153*   Lipid Profile: No results for input(s): CHOL, HDL, LDLCALC, TRIG, CHOLHDL, LDLDIRECT in the last 72 hours. Thyroid Function Tests: No results for input(s): TSH, T4TOTAL, FREET4, T3FREE, THYROIDAB in the last 72 hours. Anemia Panel: No results for input(s): VITAMINB12, FOLATE, FERRITIN, TIBC, IRON, RETICCTPCT in the last 72 hours. Urine analysis:    Component Value Date/Time   COLORURINE YELLOW 06/12/2018 2320   APPEARANCEUR CLEAR 06/12/2018 2320   LABSPEC >1.046 (H) 06/12/2018 2320   PHURINE 5.0 06/12/2018 2320   GLUCOSEU NEGATIVE 06/12/2018 2320   HGBUR NEGATIVE 06/12/2018 2320   BILIRUBINUR NEGATIVE 06/12/2018 2320   KETONESUR NEGATIVE 06/12/2018 2320   PROTEINUR NEGATIVE 06/12/2018 2320   UROBILINOGEN 0.2 05/07/2015 1345   NITRITE NEGATIVE 06/12/2018 2320   LEUKOCYTESUR NEGATIVE 06/12/2018 2320     Ripudeep Rai M.D. [facility] 06/21/2018, 2:18 PM  Pager: [phone] Between 7am to 7pm - call Pager - [phone]  After 7pm go to [email or web address] - password [email or web address]  Call night coverage person covering after 7pm

## 2018-06-21 NOTE — Progress Notes (Signed)
Patient ID: Jeffrey Nelson, male   DOB: [birth year], 83 y.o.   MRN: [id number] Patient seen in preop holding area. Legs are N-V normal.  Plan for midline surgery removal of hardware L3-5 pedicle screws and rods, transpedicular biopsy of bone vertebral body L3 with cultures. Explore fusion, if no instability will place stimulan vancomycin beads transpedicular and then Perform closure over drains. If unstable then reinstrument T10 to S1.  Patient was seen and examined in the preop holding area. There has been no interval  Change in this patient's exam preop  history and physical exam  Lab tests and images have been examined and reviewed.  The Risks benefits and alternative treatments have been discussed  extensively,questions answered.  The patient has elected to undergo the discussed surgical treatment.

## 2018-06-21 NOTE — Anesthesia Postprocedure Evaluation (Signed)
Anesthesia Post Note  Patient: Jeffrey Nelson  Procedure(s) Performed: Removal of hardware pedicle screws and rods L3-L5, Exploration of fusion L3-L5, L3 Transpedicular Biopsy, Placement of Vancomycin Stimulan Antibiotic Beads (N/A Back)     Patient location during evaluation: PACU Anesthesia Type: General Level of consciousness: awake and alert Pain management: pain level controlled Vital Signs Assessment: post-procedure vital signs reviewed and stable Respiratory status: spontaneous breathing, nonlabored ventilation, respiratory function stable and patient connected to nasal cannula oxygen Cardiovascular status: blood pressure returned to baseline and stable Postop Assessment: no apparent nausea or vomiting Anesthetic complications: no    Last Vitals:  Vitals:   06/21/18 1237 06/21/18 1307  BP: 131/82 (!) 151/68  Pulse: 86 82  Resp: 14 (!) 9  Temp:    SpO2: 99% 97%    Last Pain:  Vitals:   06/21/18 1222  TempSrc:   PainSc: 0-No pain                 Hardy Harcum A.

## 2018-06-21 NOTE — Anesthesia Procedure Notes (Signed)
Procedure Name: Intubation Date/Time: 06/21/2018 9:01 AM Performed by: Carney Living, CRNA Pre-anesthesia Checklist: Patient identified, Emergency Drugs available, Suction available, Patient being monitored and Timeout performed Patient Re-evaluated:Patient Re-evaluated prior to induction Oxygen Delivery Method: Circle system utilized Preoxygenation: Pre-oxygenation with 100% oxygen Induction Type: IV induction Ventilation: Mask ventilation without difficulty and Oral airway inserted - appropriate to patient size Laryngoscope Size: Mac and 4 Grade View: Grade I Tube type: Oral Tube size: 7.5 mm Number of attempts: 1 Airway Equipment and Method: Stylet Placement Confirmation: ETT inserted through vocal cords under direct vision,  positive ETCO2 and breath sounds checked- equal and bilateral Secured at: 23 cm Tube secured with: Tape Dental Injury: Teeth and Oropharynx as per pre-operative assessment

## 2018-06-21 NOTE — Discharge Instructions (Signed)

## 2018-06-21 NOTE — Transfer of Care (Signed)
Immediate Anesthesia Transfer of Care Note  Patient: Jeffrey Nelson  Procedure(s) Performed: Removal of hardware pedicle screws and rods L3-L5, Exploration of fusion L3-L5, L3 Transpedicular Biopsy, Placement of Vancomycin Stimulan Antibiotic Beads (N/A Back)  Patient Location: PACU  Anesthesia Type:General  Level of Consciousness: awake, alert  and patient cooperative  Airway & Oxygen Therapy: Patient Spontanous Breathing and Patient connected to nasal cannula oxygen  Post-op Assessment: Report given to RN, Post -op Vital signs reviewed and stable and Patient moving all extremities X 4  Post vital signs: Reviewed and stable  Last Vitals:  Vitals Value Taken Time  BP 159/80 06/21/2018 12:22 PM  Temp 36.5 C 06/21/2018 12:22 PM  Pulse 92 06/21/2018 12:26 PM  Resp 19 06/21/2018 12:26 PM  SpO2 99 % 06/21/2018 12:26 PM  Vitals shown include unvalidated device data.  Last Pain:  Vitals:   06/21/18 0552  TempSrc: Oral  PainSc:       Patients Stated Pain Goal: 2 (06/20/18 1015)  Complications: No apparent anesthesia complications

## 2018-06-21 NOTE — Brief Op Note (Signed)
06/12/2018 - 06/21/2018  11:48 AM  PATIENT:  Jeffrey Nelson  83 y.o. male  PRE-OPERATIVE DIAGNOSIS:  osteomyelitis L2, L3, L4, Discitis L2-3 and L3-4  POST-OPERATIVE DIAGNOSIS:  osteomyelitis L2, L3, L4, Discitis L2-3 and L3-4  PROCEDURE:  Procedure(s): Removal of hardware pedicle screws and rods L3-L5, Exploration of fusion L3-L5, L3 Transpedicular Biopsy, Placement of Vancomycin Stimulan Antibiotic Beads (N/A)  SURGEON:  Surgeon(s) and Role:    * Kerrin Champagne, MD - Primary  PHYSICIAN ASSISTANT: Andee Lineman  ANESTHESIA:   local and general  EBL:  200 mL   BLOOD ADMINISTERED:none  DRAINS: (tandem bilateral) Hemovact drain(s) in the lower lumbar with  Suction Clamped and Urinary Catheter (Foley)   LOCAL MEDICATIONS USED:  MARCAINE 0.5% 1:1 EXPAREL 1.3% Amount:10 ml  SPECIMEN:  Source of Specimen:  Left and right L3 Pedicle screws periscrew soft tissue glycocalyx for tissue culture and sensitivity, and stat gram stain and Core Needle Biopsy Core biopsies transpedicular bilateral L3 vertebral body.  DISPOSITION OF SPECIMEN:  PATHOLOGY for core biopsy material L3 vertebral body transpedicular.  Peri L3 screw soft tissue(glycocalyx) sent for soft tissue biopsy.   COUNTS:  YES  TOURNIQUET:  * No tourniquets in log *  DICTATION: .Dragon Dictation  PLAN OF CARE: Admit to inpatient   PATIENT DISPOSITION:  PACU - hemodynamically stable.   Delay start of Pharmacological VTE agent (>24hrs) due to surgical blood loss or risk of bleeding: no

## 2018-06-22 LAB — GLUCOSE, CAPILLARY
GLUCOSE-CAPILLARY: 132 mg/dL — AB (ref 70–99)
GLUCOSE-CAPILLARY: 140 mg/dL — AB (ref 70–99)
Glucose-Capillary: 161 mg/dL — ABNORMAL HIGH (ref 70–99)
Glucose-Capillary: 122 mg/dL — ABNORMAL HIGH (ref 70–99)

## 2018-06-22 LAB — BASIC METABOLIC PANEL
Anion gap: 4 — ABNORMAL LOW (ref 5–15)
BUN: 9 mg/dL (ref 8–23)
CHLORIDE: 104 mmol/L (ref 98–111)
CO2: 32 mmol/L (ref 22–32)
CREATININE: 0.79 mg/dL (ref 0.61–1.24)
Calcium: 9.2 mg/dL (ref 8.9–10.3)
GFR calc Af Amer: >60 mL/min (ref >60)
GFR calc non Af Amer: >60 mL/min (ref >60)
Glucose, Bld: 157 mg/dL — ABNORMAL HIGH (ref 70–99)
Potassium: 4.8 mmol/L (ref 3.5–5.1)
Sodium: 140 mmol/L (ref 135–145)

## 2018-06-22 LAB — CBC
HEMATOCRIT: 32.3 % — AB (ref 39.0–52.0)
HEMOGLOBIN: 10.3 g/dL — AB (ref 13.0–17.0)
MCH: 30.7 pg (ref 26.0–34.0)
MCHC: 31.9 g/dL (ref 30.0–36.0)
MCV: 96.4 fL (ref 80.0–100.0)
Platelets: 357 10*3/uL (ref 150–400)
RBC: 3.35 MIL/uL — AB (ref 4.22–5.81)
RDW: 13.8 % (ref 11.5–15.5)
WBC: 11.6 10*3/uL — ABNORMAL HIGH (ref 4.0–10.5)
nRBC: 0 % (ref 0.0–0.2)

## 2018-06-22 NOTE — Evaluation (Signed)
Physical Therapy Evaluation Patient Details Name: Jeffrey Nelson MRN: [id number] DOB: September 27, 1934 Today's Date: 06/22/2018   History of Present Illness  Patient is a 83 y/o male who presents with L1-2, 2-3, 3-4 discitis and osteomyelitis now s/p removal of hardware, exploration of fusion L3-L5 and Placement of Vancomycin Stimulan Antibiotic Beads 10/25. PMh includes CAD, DM, HTN, MI, CVA.  Clinical Impression  Patient presents with pain, generalized weakness, confusion, impaired safety awareness and impaired mobility s/p above. Tolerated transfers and gait training with min A-min guard assist for balance/safety. Pt independent PTA per wife and caring for self. Education re: back precautions, log roll technique, TLSO, etc. Pt requires cues to adhere to back precautions during functional mobility. Highly motivated to return to PLOF. Anticipate pt's mobility will improve with increased activity however if safety awareness does not increase, pt may need SNF. Will continue to follow and monitor progress while in the hospital.     Follow Up Recommendations Home health PT;Supervision for mobility/OOB    Equipment Recommendations  None recommended by PT    Recommendations for Other Services OT consult     Precautions / Restrictions Precautions Precautions: Fall;Back Precaution Booklet Issued: No Precaution Comments: reviewed precautions- wife able to state; hemovac Required Braces or Orthoses: Spinal Brace Spinal Brace: Thoracolumbosacral orthotic;Applied in sitting position Restrictions Weight Bearing Restrictions: No      Mobility  Bed Mobility               General bed mobility comments: Sitting EOB upon PT arrival.   Transfers Overall transfer level: Needs assistance Equipment used: Rolling walker (2 wheeled) Transfers: Sit to/from Stand Sit to Stand: Min assist         General transfer comment: Min A to power to standing and for balance. Transferred to chair post  ambulation.  Ambulation/Gait Ambulation/Gait assistance: Min assist Gait Distance (Feet): 200 Feet Assistive device: Rolling walker (2 wheeled) Gait Pattern/deviations: Step-through pattern;Decreased stride length;Decreased stance time - left;Decreased step length - right;Antalgic;Trunk flexed     General Gait Details: Cues for upright posture and RW management/proximity. poor safety awareness using RW- difficulty with turns.  Stairs            Wheelchair Mobility    Modified Rankin (Stroke Patients Only)       Balance Overall balance assessment: Needs assistance Sitting-balance support: Feet supported;No upper extremity supported Sitting balance-Leahy Scale: Good Sitting balance - Comments: Assist to donn TLSO.   Standing balance support: During functional activity;Bilateral upper extremity supported Standing balance-Leahy Scale: Poor Standing balance comment: Reliant on BUEs for support in standing                             Pertinent Vitals/Pain Pain Assessment: Faces Faces Pain Scale: Hurts a little bit Pain Location: back at surgical site Pain Descriptors / Indicators: Operative site guarding;Sore Pain Intervention(s): Monitored during session;Repositioned    Home Living Family/patient expects to be discharged to:: Private residence Living Arrangements: Spouse/significant other Available Help at Discharge: Family;Available 24 hours/day Type of Home: House Home Access: Stairs to enter Entrance Stairs-Rails: Right Entrance Stairs-Number of Steps: 5 Home Layout: One level Home Equipment: Walker - 2 wheels      Prior Function Level of Independence: Independent         Comments: Independent with ADLs and mobility. Not driving much.      Hand Dominance   Dominant Hand: Right    Extremity/Trunk Assessment  Upper Extremity Assessment Upper Extremity Assessment: Defer to OT evaluation    Lower Extremity Assessment Lower Extremity  Assessment: Generalized weakness(Reports sensation intact?)    Cervical / Trunk Assessment Cervical / Trunk Assessment: Other exceptions Cervical / Trunk Exceptions: s/p spine surgery  Communication   Communication: HOH  Cognition Arousal/Alertness: Awake/alert Behavior During Therapy: WFL for tasks assessed/performed Overall Cognitive Status: Impaired/Different from baseline Area of Impairment: Memory;Safety/judgement                     Memory: Decreased short-term memory   Safety/Judgement: Decreased awareness of safety;Decreased awareness of deficits     General Comments: Pt seems somewhat confused. Wife agrees. Fidgeting with lines.       General Comments General comments (skin integrity, edema, etc.): Wife present during session. Did not get to chat with wife much as MD came in towards end of session to discuss pt's cognitive baseline.     Exercises     Assessment/Plan    PT Assessment Patient needs continued PT services  PT Problem List Decreased strength;Decreased mobility;Decreased safety awareness;Decreased knowledge of precautions;Decreased skin integrity;Pain;Decreased balance;Decreased knowledge of use of DME       PT Treatment Interventions DME instruction;Functional mobility training;Balance training;Gait training;Therapeutic activities;Stair training;Therapeutic exercise;Patient/family education    PT Goals (Current goals can be found in the Care Plan section)  Acute Rehab PT Goals Patient Stated Goal: to get well  PT Goal Formulation: With patient/family Time For Goal Achievement: 07/06/18 Potential to Achieve Goals: Good    Frequency Min 5X/week   Barriers to discharge Inaccessible home environment;Decreased caregiver support stairs to enter home    Co-evaluation               AM-PAC PT "6 Clicks" Daily Activity  Outcome Measure Difficulty turning over in bed (including adjusting bedclothes, sheets and blankets)?: A  Little Difficulty moving from lying on back to sitting on the side of the bed? : Unable Difficulty sitting down on and standing up from a chair with arms (e.g., wheelchair, bedside commode, etc,.)?: A Lot Help needed moving to and from a bed to chair (including a wheelchair)?: A Little Help needed walking in hospital room?: A Little Help needed climbing 3-5 steps with a railing? : A Little 6 Click Score: 15    End of Session Equipment Utilized During Treatment: Back brace;Gait belt Activity Tolerance: Patient tolerated treatment well Patient left: in chair;with call bell/phone within reach;with chair alarm set;with family/visitor present Nurse Communication: Mobility status PT Visit Diagnosis: Unsteadiness on feet (R26.81);Difficulty in walking, not elsewhere classified (R26.2);Pain Pain - part of body: (back)    Time: 1610-9604 PT Time Calculation (min) (ACUTE ONLY): 23 min   Charges:   PT Evaluation $PT Eval Moderate Complexity: 1 Mod PT Treatments $Gait Training: 8-22 mins        Mylo Red, PT, DPT Acute Rehabilitation Services Pager [phone] Office [phone]      Blake Divine A Lanier Ensign 06/22/2018, 10:55 AM

## 2018-06-22 NOTE — Progress Notes (Addendum)
[facility]    Date of Admission:  06/12/2018   Total days of antibiotics 11           ID: Jeffrey Nelson is a 83 y.o. male with vertebral osteo with HW infection Principal Problem:   Vertebral osteomyelitis, chronic (HCC) Active Problems:   Spinal stenosis, lumbar region, with neurogenic claudication   Stenosis of cervical spine with myelopathy (HCC)   Diabetes mellitus type 2, uncomplicated (HCC)   Osteomyelitis (HCC)   Essential hypertension   BPH (benign prostatic hyperplasia)   Discitis of lumbar region   Loosening of hardware in spine (HCC)    Subjective: Tolerated procedure of Removal of hardware pedicle screws and rods L3-L5, Exploration of fusion L3-L5, L3 Transpedicular Biopsy, Placement of Vancomycin Stimulan Antibiotic Beads yesterday. Prelim gram stain showed GNR. He is sitting up with back brace reading the paper with his wife  ROS: no fevers, chills, nightsweats, no diarrhea or constipation. No n/v Medications:  . aspirin EC  81 mg Oral Daily  . cholecalciferol  5,000 Units Oral Daily  . docusate sodium  100 mg Oral BID  . feeding supplement (ENSURE ENLIVE)  237 mL Oral TID PC & HS  . finasteride  5 mg Oral Daily  . gabapentin  300 mg Oral BID  . HYDROcodone-acetaminophen  1 tablet Oral Q6H  . insulin aspart  0-9 Units Subcutaneous TID WC  . metoprolol tartrate  25 mg Oral BID  . pantoprazole  40 mg Oral Daily  . polyethylene glycol  17 g Oral BID  . senna  1 tablet Oral BID  . sodium chloride flush  3 mL Intravenous Q12H  . tamsulosin  0.4 mg Oral QPC supper    Objective: Vital signs in last 24 hours: Temp:  [97.2 F (36.2 C)-98.3 F (36.8 C)] 98.3 F (36.8 C) (10/26 0842) Pulse Rate:  [60-94] 60 (10/26 0842) Resp:  [9-20] 18 (10/26 0842) BP: (110-159)/(57-82) 110/62 (10/26 0842) SpO2:  [92 %-100 %] 99 % (10/26 0842) Weight:  [100.9 kg] 100.9 kg (10/25 2044) Physical Exam  Constitutional: He is oriented to person,  place, and time. He appears well-developed and well-nourished. No distress.  HENT:  Mouth/Throat: Oropharynx is clear and moist. No oropharyngeal exudate.  Cardiovascular: Normal rate, regular rhythm and normal heart sounds. No g/m/r Pulmonary/Chest: Effort normal and breath sounds normal. No respiratory distress. He has no wheezes.  Abdominal: Soft. Bowel sounds are normal. He exhibits no distension. There is no tenderness.  Back = has accordian drain with serosanginous fluid Ext: picc line in RUE is c/d/i Neurological: He is alert and oriented to person, place, and time.  Skin: Skin is warm and dry. No rash noted. No erythema.  Psychiatric: He has a normal mood and affect. His behavior is normal.      Lab Results Recent Labs    06/21/18 0345 06/22/18 0350  WBC 8.7 11.6*  HGB 11.9* 10.3*  HCT 37.7* 32.3*  NA 139 140  K 4.2 4.8  CL 100 104  CO2 30 32  BUN 13 9  CREATININE 0.86 0.79   Lab Results  Component Value Date   ESRSEDRATE 44 (H) 06/12/2018    Microbiology: pending Studies/Results: Dg Lumbar Spine 2-3 Views  Result Date: 06/21/2018 CLINICAL DATA:  ELECTIVE EXAM: DG C-ARM 61-120 MIN; LUMBAR SPINE - 2-3 VIEW COMPARISON:  No recent prior. FINDINGS: Postsurgical changes noted of the lumbar spine. Levels of interbody fusion or noted. Number lumbar spine  is difficult due to positioning. 0 minutes 29 seconds fluoroscopy time utilized. IMPRESSION: Postsurgical changes lumbar spine. Electronically Signed   By: Maisie Fus  Register   On: 06/21/2018 13:07   Dg C-arm 1-60 Min  Result Date: 06/21/2018 CLINICAL DATA:  ELECTIVE EXAM: DG C-ARM 61-120 MIN; LUMBAR SPINE - 2-3 VIEW COMPARISON:  No recent prior. FINDINGS: Postsurgical changes noted of the lumbar spine. Levels of interbody fusion or noted. Number lumbar spine is difficult due to positioning. 0 minutes 29 seconds fluoroscopy time utilized. IMPRESSION: Postsurgical changes lumbar spine. Electronically Signed   By: Maisie Fus   Register   On: 06/21/2018 13:07     Assessment/Plan: 83 yo M with osteomyelitis L2, L3, L4, Discitis L2-3 and L3-4 POD#1 s/p Removal of hardware pedicle screws and rods L3-L5, Exploration of fusion L3-L5, L3. Still has some HW in place. Unclear if any cultures will grow since patient had been on abtx for roughly 9 d prior to surgery  For now will plan to continue on iv vancomycin plus ceftriaxone 2gm IV daily Adjust vanco dosing per renal function Will follow culture results to make abtx changes if necessary.  [facility] Cell: [phone] Pager: [phone]  06/22/2018, 10:57 AM

## 2018-06-22 NOTE — Progress Notes (Signed)
[facility]                                                                              Patient Demographics  Jeffrey Nelson, is a 83 y.o. male, DOB - 01/16/1935, GNF:[id number]  Admit date - 06/12/2018   Admitting Physician Mauricio Annett Gula, MD  Outpatient Primary MD for the patient is Ryter-Brown, Fritzi Mandes, MD  Outpatient specialists:   LOS - 10  days   Medical records reviewed and are as summarized below:    Chief Complaint  Patient presents with  . Back Pain       Brief summary   83 y.o.malewith medical history significant ofcompression fracture of L3 lumbar vertebra with nonunion, status post lumbar spinal fusion (April, 2019),type 2 diabetes mellitus, coronary artery disease status post bypass grafting and stenting, hypertension and dyslipidemia.   Patient presented to his orthopedic surgeon with complains of worsening back pain over the last 2 weeks.  He underwent lumbar spine surgery with fusion earlier this year.  Patient was sent to the emergency department by the orthopedic surgeon.  MRI was done which raise concern for osteomyelitis at L3-L4 and L2-L3.  Patient was hospitalized for further management.  Orthopedics and infectious disease consulted.   Assessment & Plan   Principal problem L2-L3, L3-L4 discitis/osteomyelitis involving the hardware -Seen by orthopedics and ID -Underwent disc aspiration by IR on 10/18, cultures negative so far -Patient placed on vancomycin and ceftriaxone, per ID plan for 6 weeks of IV antibiotics -PICC line placed -Orthopedics following, patient underwent removal of hardware L3-L5, exploration of fusion L3-L5, biopsy L3, placement of vancomycin antibiotic beads, postop day #1, no new complaints, pain controlled  Active Problems:  Acute on chronic back pain - currently pain controlled, no new changes  CAD status post CABG, angioplasty Cardiac cath in 02/2017, last stent placed in November 2017, Plavix  currently on hold for surgery on 10/25 No chest pain or shortness of breath, stable  Diabetes mellitus type 2 -CBGs fairly controlled  Constipation Continue bowel regimen, likely due to narcotics  Essential hypertension Continue beta-blocker  BPH Continue tamsulosin, finasteride   Code Status: Full CODE STATUS DVT Prophylaxis:  Lovenox  Family Communication: Discussed in detail with the patient, all imaging results, lab results explained to the patient   Disposition Plan:   Time Spent in minutes 25-minute  Procedures:  L2-L3 disc aspiration by IR on 10/18  Consultants:   Orthopedics Infectious disease IR  Antimicrobials:      Medications  Scheduled Meds: . aspirin EC  81 mg Oral Daily  . cholecalciferol  5,000 Units Oral Daily  . docusate sodium  100 mg Oral BID  . feeding supplement (ENSURE ENLIVE)  237 mL Oral TID PC & HS  . finasteride  5 mg Oral Daily  . gabapentin  300 mg Oral BID  . HYDROcodone-acetaminophen  1 tablet Oral Q6H  . insulin aspart  0-9 Units Subcutaneous TID WC  . metoprolol tartrate  25 mg Oral BID  . pantoprazole  40 mg Oral Daily  . polyethylene glycol  17 g Oral BID  . senna  1 tablet Oral  BID  . sodium chloride flush  3 mL Intravenous Q12H  . tamsulosin  0.4 mg Oral QPC supper   Continuous Infusions: . sodium chloride    . sodium chloride Stopped (06/22/18 0946)  . sodium chloride 10 mL/hr at 06/22/18 1000  . cefTRIAXone (ROCEPHIN)  IV 2 g (06/22/18 1237)  . famotidine (PEPCID) IV 100 mL/hr at 06/22/18 1000  . methocarbamol (ROBAXIN) IV    . vancomycin Stopped (06/22/18 0521)   PRN Meds:.sodium chloride, acetaminophen **OR** acetaminophen, bisacodyl, cyclobenzaprine, menthol-cetylpyridinium **OR** phenol, methocarbamol **OR** methocarbamol (ROBAXIN) IV, morphine injection, ondansetron **OR** ondansetron (ZOFRAN) IV, oxyCODONE, oxyCODONE, polyethylene glycol, simethicone, sodium chloride flush, sodium chloride flush, sodium  phosphate, sodium phosphate, traZODone   Antibiotics   Anti-infectives (From admission, onward)   Start     Dose/Rate Route Frequency Ordered Stop   06/21/18 1238  bacitracin 50,000 units, gentamicin 80mg  & cefazolin 1 g in normal saline ( )  Status:  Discontinued       As needed 06/21/18 1238 06/21/18 1238   06/21/18 1152  vancomycin (VANCOCIN) powder  Status:  Discontinued       As needed 06/21/18 1152 06/21/18 1211   06/21/18 1045  bacitracin 50,000 Units, gentamicin (GARAMYCIN) 80 mg, ceFAZolin (ANCEF) 1 g in sodium chloride 0.9 % 1,000 mL  Status:  Discontinued      Irrigation Once 06/21/18 1038 06/21/18 1443   06/17/18 1700  vancomycin (VANCOCIN) 1,250 mg in sodium chloride 0.9 % 250 mL IVPB     1,250 mg 166.7 mL/hr over 90 Minutes Intravenous Every 12 hours 06/17/18 1626     06/14/18 1315  vancomycin (VANCOCIN) IVPB 1000 mg/200 mL premix  Status:  Discontinued     1,000 mg 200 mL/hr over 60 Minutes Intravenous Every 12 hours 06/14/18 1303 06/17/18 1626   06/14/18 1300  cefTRIAXone (ROCEPHIN) 2 g in sodium chloride 0.9 % 100 mL IVPB     2 g 200 mL/hr over 30 Minutes Intravenous Every 24 hours 06/14/18 1258     06/13/18 0830  vancomycin (VANCOCIN) 1,250 mg in sodium chloride 0.9 % 250 mL IVPB  Status:  Discontinued     1,250 mg 166.7 mL/hr over 90 Minutes Intravenous Every 12 hours 06/12/18 2012 06/13/18 1418   06/13/18 0800  piperacillin-tazobactam (ZOSYN) IVPB 3.375 g  Status:  Discontinued     3.375 g 12.5 mL/hr over 240 Minutes Intravenous Every 8 hours 06/12/18 2012 06/13/18 1418   06/12/18 2030  piperacillin-tazobactam (ZOSYN) IVPB 3.375 g     3.375 g 100 mL/hr over 30 Minutes Intravenous  Once 06/12/18 2008 06/12/18 2320   06/12/18 2030  vancomycin (VANCOCIN) 2,000 mg in sodium chloride 0.9 % 500 mL IVPB     2,000 mg 250 mL/hr over 120 Minutes Intravenous  Once 06/12/18 2008 06/12/18 2331        Subjective:   Jeffrey Nelson was seen and examined today.  Alert  and oriented, states pain is controlled until he moves.  No acute issues overnight.  No fevers or chills.    Patient denies dizziness, chest pain, shortness of breath, abdominal pain, N/V/D/C.  Objective:   Vitals:   06/21/18 2043 06/21/18 2044 06/22/18 0526 06/22/18 0842  BP: (!) 124/57  (!) 125/57 110/62  Pulse: 82  76 60  Resp: 16  20 18   Temp: 98 F (36.7 C)  97.9 F (36.6 C) 98.3 F (36.8 C)  TempSrc: Oral   Oral  SpO2: 94%  96% 99%  Weight:  100.9 kg  Intake/Output Summary (Last 24 hours) at 06/22/2018 1355 Last data filed at 06/22/2018 1000 Gross per 24 hour  Intake 2660.18 ml  Output 3105 ml  Net -444.82 ml     Wt Readings from Last 3 Encounters:  06/21/18 100.9 kg  06/12/18 99.3 kg  05/29/18 99.3 kg     Exam    General: Alert and oriented x 3, NAD  Eyes:   HEENT:    Cardiovascular: S1 S2 auscultated, Regular rate and rhythm. No pedal edema b/l  Respiratory: Clear to auscultation bilaterally, no wheezing, rales or rhonchi  Gastrointestinal: Soft, nontender, nondistended, + bowel sounds  Ext: no pedal edema bilaterally  Neuro: able to wiggle both toes  Musculoskeletal: No digital cyanosis, clubbing  Skin: No rashes  Psych: Normal affect and demeanor, alert and oriented x3     Data Reviewed:  I have personally reviewed following labs and imaging studies  Micro Results Recent Results (from the past 240 hour(s))  Aerobic/Anaerobic Culture (surgical/deep wound)     Status: None   Collection Time: 06/14/18 10:20 AM  Result Value Ref Range Status   Specimen Description BACK  Final   Special Requests DISC  Final   Gram Stain   Final    FEW WBC PRESENT,BOTH PMN AND MONONUCLEAR NO ORGANISMS SEEN    Culture   Final    No growth aerobically or anaerobically. Performed at [facility], [address], [place], [place] [address]    Report Status 06/19/2018 FINAL  Final  Surgical pcr screen     Status: None   Collection Time:  06/20/18 11:47 PM  Result Value Ref Range Status   MRSA, PCR NEGATIVE NEGATIVE Final   Staphylococcus aureus NEGATIVE NEGATIVE Final    Comment: (NOTE) The Xpert SA Assay (FDA approved for NASAL specimens in patients 36 years of age and older), is one component of a comprehensive surveillance program. It is not intended to diagnose infection nor to guide or monitor treatment. Performed at [facility], [address] [place], [place] [address]   Aerobic/Anaerobic Culture (surgical/deep wound)     Status: None (Preliminary result)   Collection Time: 06/21/18 10:08 AM  Result Value Ref Range Status   Specimen Description BONE  Final   Special Requests NONE  Final   Gram Stain   Final    RARE WBC PRESENT, PREDOMINANTLY PMN RARE GRAM NEGATIVE RODS Performed at [facility], [address], [place], [place] [address]    Culture PENDING  Incomplete   Report Status PENDING  Incomplete    Radiology Reports Dg Lumbar Spine 2-3 Views  Result Date: 06/21/2018 CLINICAL DATA:  ELECTIVE EXAM: DG C-ARM 61-120 MIN; LUMBAR SPINE - 2-3 VIEW COMPARISON:  No recent prior. FINDINGS: Postsurgical changes noted of the lumbar spine. Levels of interbody fusion or noted. Number lumbar spine is difficult due to positioning. 0 minutes 29 seconds fluoroscopy time utilized. IMPRESSION: Postsurgical changes lumbar spine. Electronically Signed   By: Maisie Fus  Register   On: 06/21/2018 13:07   Mr Lumbar Spine Wo Contrast  Result Date: 06/17/2018 CLINICAL DATA:  Discitis osteomyelitis. History of lumbar spine surgery with hardware failure and compression fracture. EXAM: MRI LUMBAR SPINE WITHOUT CONTRAST TECHNIQUE: Multiplanar, multisequence MR imaging of the lumbar spine was performed. No intravenous contrast was administered. COMPARISON:  MRI of the lumbar spine June 12, 2018 and CT abdomen and pelvis June 12, 2018 FINDINGS: SEGMENTATION: For the purposes of this report, the last well-formed  intervertebral  disc is reported as L5-S1. ALIGNMENT: Maintained lumbar lordosis. No malalignment. VERTEBRAE:Status post L3 through S1 PLIF. Redemonstration of moderate L3 fracture with approximately 50% height loss, the pedicle screws are migrated to the superior cortex, unchanged. Pedicle screw of L4 may be extending into the disc space. L3-4 subsidence and abnormal signal within the disc space at L2-3, L3-4. Mild old T12 compression fracture. CONUS MEDULLARIS AND CAUDA EQUINA: Conus medullaris terminates at T12-L1 and demonstrates normal morphology and signal characteristics. Cauda equina is normal. PARASPINAL AND OTHER SOFT TISSUES: LEFT greater than RIGHT iliopsoas myositis without focal fluid collection. Paraspinal denervation at below the level surgical intervention. DISC LEVELS: T12-L1: No axial sequences. Stable small broad-based disc bulge without canal stenosis. Mild neural foraminal narrowing. L1-2: Small broad-based disc bulge asymmetric to the RIGHT. No canal stenosis. Mild RIGHT neural foraminal narrowing. L2-3: Disc bulge. No canal stenosis. Moderate to severe RIGHT greater LEFT neural foraminal narrowing. L3-4: PLIF and posterior decompression without canal stenosis. Soft tissue effacing the neural foramen. L4-5: PLIF. Posterior decompression. No canal stenosis. Abnormal soft tissue LEFT neural foramen. L5-S1: Transitional anatomy, sacralized L5 vertebral body. No disc bulge, canal stenosis or neural foraminal narrowing. IMPRESSION: 1. Similar appearance of L2-3 and L3-4 discitis osteomyelitis. Associated iliopsoas myositis. 2. Status post L3 through L5 PLIF and posterior decompression with hardware failure. Moderate similar L2 pathologic fracture. 3. No canal stenosis. 4. Neural foraminal narrowing T12-L1 through L4-5; soft tissue effacing the neural foramen L2-3 through L4-5 could reflect disc material or purulent material. Electronically Signed   By: Awilda Metro M.D.   On: 06/17/2018 21:39     Mr Lumbar Spine Wo Contrast  Result Date: 06/12/2018 CLINICAL DATA:  83 year old male with increased lumbar spine pain status post fusion 6 months ago. Fractures/collapse of the L3 vertebral body. EXAM: MRI LUMBAR SPINE WITHOUT CONTRAST TECHNIQUE: Multiplanar, multisequence MR imaging of the lumbar spine was performed. No intravenous contrast was administered. COMPARISON:  CT lumbar Spine [facility] 05/14/2018. Lumbar MRI 12/05/2017, and earlier. FINDINGS: Segmentation: Hypoplastic ribs assumed T12 as on the CT and April MRI numbering. Alignment: Straightening of lumbar lordosis is stable from last month. Vertebrae: Confluent marrow edema from the posterior inferior L2 body, throughout the L3 body, and into the superior and posterior L4 body. See series 7, image 9. This has progressed from the L3-L4 level endplate edema seen in April, with moderate collapse of L3 and erosion of the L3-L4 endplates since the prior MRI. Interbody implant at L3-L4. New abnormal increased T2 and STIR signal in the L2-L3 disc, with new L2 endplate and body marrow edema since April. The L1 and L5 levels appear stable and intact. Mild chronic T12 inferior endplate compression is stable. Intact visible lower thoracic levels. Intact visible sacrum and SI joints. Conus medullaris and cauda equina: Conus extends to the T12-L1 level. No lower spinal cord or conus signal abnormality. Paraspinal and other soft tissues: Confluent left greater than right medial psoas muscle edema as seen on series 6, image 13 and series 5, images 17 (on the left) and 1 (on the right). No intramuscular fluid collection is identified. The STIR images demonstrate prominent marrow edema in the L3 transverse processes. Superimposed postoperative changes to the lower lumbar erector spinae muscles. Negative visible abdominal viscera. Disc levels: No Lower spinal stenosis above L2. L2-L3: New abnormal increased T2 and STIR signal within the  disc. Progressed circumferential disc bulge with chronic mild to moderate facet and ligament flavum hypertrophy. New mild to  moderate spinal stenosis. Chronic mild to moderate L2 foraminal stenosis greater on the right. L3-L4: Posterior and interbody fusion hardware here and prior decompression. No spinal stenosis. There appears to be soft tissue edema affecting the bilateral L3 neural foramina. L4-L5: Posterior and interbody fusion hardware plus decompression here. Mild residual right L4 foraminal stenosis related to facet hypertrophy. L5-S1: Stable and negative aside from mild facet hypertrophy. IMPRESSION: 1. Constellation of imaging findings since the 12/05/2017 MRI is most compatible with prolonged Discitis Osteomyelitis at L3-L4, and extension of Discitis Osteomyelitis to L2-L3 since April. There is bilateral L3 through L5 spinal fusion hardware in place. Associated partial collapse of L3 with L3 and L4 endplate erosion. Abnormal L2-L3 disc and L2 inferior endplates now. 2. Bilateral psoas muscle edema/inflammation. No intramuscular or epidural abscess identified. 3. Multifactorial mild to moderate spinal stenosis at L2-L3 is new since April. Electronically Signed   By: Odessa Fleming M.D.   On: 06/12/2018 16:14   Nm Bone Scan 3 Phase  Result Date: 06/06/2018 CLINICAL DATA:  SI joint pain, question spondyloarthropathy, painful loosening of hardware L3, LEFT sacral pain, prior L3-L5 fusion in April 2019, assess for sacral insufficiency fracture EXAM: NUCLEAR MEDICINE 3-PHASE BONE SCAN TECHNIQUE: Radionuclide angiographic images, immediate static blood pool images, and 3-hour delayed static images were obtained of the lumbar spine, pelvis and hips after intravenous injection of radiopharmaceutical. RADIOPHARMACEUTICALS:  22 mCi Tc-32m MDP IV COMPARISON:  None Correlation: Lumbar spine radiographs 05/29/2018, pelvic radiograph 01/17/2018 FINDINGS: Vascular phase: Normal blood flow to the hips and pelvis Blood pool  phase: Normal blood pool at the pelvis and hips. Mildly increased blood pool in the lumbar region at approximately the L3-L5 Delayed phase: Delayed images show normal tracer accumulation at the hip joints and SI joints. Normal tracer localization in the pelvis and proximal femora. No scintigraphic evidence of sacral insufficiency fracture. Diffusely increased tracer localization is seen in lumbar spine at L3-L5, predominantly at L3-L4, corresponding to the surgical levels identified on recent radiographs. These findings are consistent with the relative recent age of surgery and expected postoperative uptake. IMPRESSION: Normal scintigraphic appearance of the pelvis, SI joints, sacrum, and hip joints. Increased blood pool and delayed uptake of tracer in lower lumbar spine corresponding to prior L3-L5 fusion, with greatest uptake centered at L3-L4. Degree of uptake is consistent with preceding spine surgery. Due to postsurgical uptake, unable to exclude underlying infection. Electronically Signed   By: [name] M.D.   On: 06/06/2018 13:44   Ct Abdomen Pelvis W Contrast  Result Date: 06/12/2018 CLINICAL DATA:  Left lower quadrant pain with diarrhea. EXAM: CT ABDOMEN AND PELVIS WITH CONTRAST TECHNIQUE: Multidetector CT imaging of the abdomen and pelvis was performed using the standard protocol following bolus administration of intravenous contrast. CONTRAST:  100 cc Omnipaque 300 intravenous COMPARISON:  MRI of the lumbar spine from the same day FINDINGS: Lower chest:  Coronary calcification.  No acute finding Hepatobiliary: No focal liver abnormality.Single stone within the gallbladder which is full but shows no inflammatory changes. Mild prominence of the biliary tree without calcified choledocholithiasis Pancreas: Unremarkable. Spleen: Unremarkable. 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 No hydronephrosis or stone. Subcentimeter low-densities in the left kidney, too small for  densitometry. Unremarkable bladder. Stomach/Bowel:  No obstruction. No appendicitis. Vascular/Lymphatic: No acute vascular abnormality. Extensive atherosclerotic calcification of the aorta. No mass or adenopathy. Reproductive:Probable central prostate nodule.  No acute finding. Other: No ascites or pneumoperitoneum. Musculoskeletal: L2-3 and L3-4 discitis osteomyelitis. There has  been L3-4 PLIF. The surrounding paravertebral fat is edematous. The pedicle screws at L3 and L4 show halo in/loosening. No evident collection. There is subsequent MRI available. IMPRESSION: 1. L2-3 and L3-4 discitis osteomyelitis which involves L3-4 PLIF hardware. 2. No evidence of colitis or other inflammation to explain diarrhea. 3. Cholelithiasis. Electronically Signed   By: Marnee Spring M.D.   On: 06/12/2018 18:13   Dg C-arm 1-60 Min  Result Date: 06/21/2018 CLINICAL DATA:  ELECTIVE EXAM: DG C-ARM 61-120 MIN; LUMBAR SPINE - 2-3 VIEW COMPARISON:  No recent prior. FINDINGS: Postsurgical changes noted of the lumbar spine. Levels of interbody fusion or noted. Number lumbar spine is difficult due to positioning. 0 minutes 29 seconds fluoroscopy time utilized. IMPRESSION: Postsurgical changes lumbar spine. Electronically Signed   By: Maisie Fus  Register   On: 06/21/2018 13:07   [place] Ekg Site Rite  Result Date: 06/14/2018 If Site Rite image not attached, placement could not be confirmed due to current cardiac rhythm.  Ir Lumbar Disc Aspiration W/img Guide  Result Date: 06/14/2018 INDICATION: L2-3 discitis EXAM: L2-3 DISC ASPIRATION MEDICATIONS: The patient is currently admitted to the hospital and receiving intravenous antibiotics. The antibiotics were administered within an appropriate time frame prior to the initiation of the procedure. ANESTHESIA/SEDATION: Fentanyl 100 mcg IV; Versed 2 mg IV Moderate Sedation Time:  10 minutes The patient was continuously monitored during the procedure by the interventional radiology nurse  under my direct supervision. COMPLICATIONS: None immediate. PROCEDURE: Informed written consent was obtained from the patient after a thorough discussion of the procedural risks, benefits and alternatives. All questions were addressed. Maximal Sterile Barrier Technique was utilized including caps, mask, sterile gowns, sterile gloves, sterile drape, hand hygiene and skin antiseptic. A timeout was performed prior to the initiation of the procedure. The back was prepped and draped in a sterile fashion. 1% lidocaine was utilized for local anesthesia. Under fluoroscopic guidance, an 18 gauge needle was advanced into the L2-3 disc via right posterolateral approach. Two drops gray pus was aspirated. FINDINGS: Images document needle placement in the L2-3 disc. IMPRESSION: Successful L2-3 disc aspiration yielding 2 drops great pus. Electronically Signed   By: Jolaine Click M.D.   On: 06/14/2018 13:30    Lab Data:  CBC: Recent Labs  Lab 06/16/18 0415 06/18/18 0326 06/20/18 0411 06/21/18 0345 06/22/18 0350  WBC 8.4 7.8 8.5 8.7 11.6*  HGB 10.9* 11.4* 11.7* 11.9* 10.3*  HCT 33.8* 35.8* 36.2* 37.7* 32.3*  MCV 95.5 95.5 95.8 96.7 96.4  PLT 294 369 380 384 357   Basic Metabolic Panel: Recent Labs  Lab 06/16/18 0415 06/18/18 0326 06/19/18 0330 06/20/18 0411 06/21/18 0345 06/22/18 0350  NA 137 138  --  137 139 140  K 3.7 3.8  --  4.2 4.2 4.8  CL 103 102  --  100 100 104  CO2 29 29  --  30 30 32  GLUCOSE 133* 113*  --  153* 154* 157*  BUN 9 7*  --  11 13 9   CREATININE 0.78 0.76 0.88 0.83 0.86 0.79  CALCIUM 8.9 8.9  --  9.3 9.5 9.2   GFR: Estimated Creatinine Clearance: 91.9 mL/min (by C-G formula based on SCr of 0.79 mg/dL). Liver Function Tests: No results for input(s): AST, ALT, ALKPHOS, BILITOT, PROT, ALBUMIN in the last 168 hours. No results for input(s): LIPASE, AMYLASE in the last 168 hours. No results for input(s): AMMONIA in the last 168 hours. Coagulation Profile: No results for  input(s): INR, PROTIME in  the last 168 hours. Cardiac Enzymes: No results for input(s): CKTOTAL, CKMB, CKMBINDEX, TROPONINI in the last 168 hours. BNP (last 3 results) No results for input(s): PROBNP in the last 8760 hours. HbA1C: No results for input(s): HGBA1C in the last 72 hours. CBG: Recent Labs  Lab 06/21/18 1222 06/21/18 1616 06/21/18 2145 06/22/18 0844 06/22/18 1152  GLUCAP 153* 171* 173* 132* 161*   Lipid Profile: No results for input(s): CHOL, HDL, LDLCALC, TRIG, CHOLHDL, LDLDIRECT in the last 72 hours. Thyroid Function Tests: No results for input(s): TSH, T4TOTAL, FREET4, T3FREE, THYROIDAB in the last 72 hours. Anemia Panel: No results for input(s): VITAMINB12, FOLATE, FERRITIN, TIBC, IRON, RETICCTPCT in the last 72 hours. Urine analysis:    Component Value Date/Time   COLORURINE YELLOW 06/12/2018 2320   APPEARANCEUR CLEAR 06/12/2018 2320   LABSPEC >1.046 (H) 06/12/2018 2320   PHURINE 5.0 06/12/2018 2320   GLUCOSEU NEGATIVE 06/12/2018 2320   HGBUR NEGATIVE 06/12/2018 2320   BILIRUBINUR NEGATIVE 06/12/2018 2320   KETONESUR NEGATIVE 06/12/2018 2320   PROTEINUR NEGATIVE 06/12/2018 2320   UROBILINOGEN 0.2 05/07/2015 1345   NITRITE NEGATIVE 06/12/2018 2320   LEUKOCYTESUR NEGATIVE 06/12/2018 2320     Ripudeep Rai M.D. [facility] 06/22/2018, 1:55 PM  Pager: [phone] Between 7am to 7pm - call Pager - [phone]  After 7pm go to [email or web address] - password [email or web address]  Call night coverage person covering after 7pm

## 2018-06-22 NOTE — Op Note (Signed)
06/21/2018  10:39 AM  PATIENT:  Jeffrey Nelson  83 y.o. male  MRN: [id number]  OPERATIVE REPORT  PRE-OPERATIVE DIAGNOSIS:  osteomyelitis L2, L3, L4, Discitis L2-3 and L3-4  POST-OPERATIVE DIAGNOSIS:  osteomyelitis L2, L3, L4, Discitis L2-3 and L3-4  PROCEDURE:  Procedure(s): Removal of hardware pedicle screws and rods L3-L5, Exploration of fusion L3-L5, L3 Transpedicular Biopsy, Placement of Vancomycin Stimulan Antibiotic Beads    SURGEON:  Jessy Oto, MD     ASSISTANT:James Ricard Dillon, PA-C  (Present throughout the entire procedure and necessary for completion of procedure in a timely manner)     ANESTHESIA:  General,supplemented with local anesthetic Marcaine 0.5% 1 to 1 Exparel 1.3% total 20cc.Dr. Josephine Igo.    COMPLICATIONS:  None.   SPECIMENS:Soft tissue from both L3 pedicle screw openings representing glycocalyx material was sent to microbiology for stat Gram stain and culture and sensitivity. Multiple bone biopsy from trephine at the L3 vertebral body were sent to pathology for pathologic diagnosis.  DRAINS: Foley to straight drain. Hemovac medium with 2 leads lower lumbar spine 2 closed vacuum container.    COMPONENTS:   Implant Name Type Inv. Item Serial No. Manufacturer Lot No. LRB No. Used  KIT STIMULAN RAPID CURE  10CC - [id number] Orthopedic Implant KIT STIMULAN RAPID CURE  10CC  [employer] [id number] N/A 1    PROCEDURE:The patient was met in the holding area, and the appropriate midline lumbar level L3-4and L4-5 identified and no marking due to midline structures with previous lumbar incision scar. The patient was then transported to OR. The patient was then placed under general anesthesia without difficulty.The patient has been receiving appropriate therapeutic IV antibiotics vancomycin and rocephin via PICCU preoperatively. Nursing staff inserted a Foley catheter under sterile conditions. He was then turned to a prone position Homer spine table was  used for this case. All pressure points were well padded PAS stocking applied bilateral lower extremity to prevent DVT. Standard prep DuraPrep solution. Draped in the usual manner from T7 to S2. Time-out procedure was called and correct .  The old incision scar was ellipsed L2 to L5 Bovie electric cautery was used to control bleeding and carefully dissection was carried down along the lateral aspects of the spinous process of L2 to L5. Cobb then used to carefully elevate the paralumbar muscles and the bilateral pedicle screw and rod construct at the L3-4 and L4-5 levels was exposed. Cerebellar retractors were placed.Loupe magnification and head light used for this case.  Soft tissue overlying the posterior hardware was then removed using barrier Roger pituitary rongeurs and curettes. Each of the caps for the pedicle screw fasteners were then removed and the transverse loading rod carefully loosened using appropriate screwdrivers. Soft tissue about the posterior aspect of the transverse loading rod was carefully freed from the transverse loading rod using curettes preserving the interspinous process ligament at this level. The transverse loading rod which was located between the L4 and L5 pedicle fasteners was then removed without difficulty. Each of the titanium rods were then removed bilaterally and the saddle portion of the pedicle screw fasteners were each carefully debrided of soft tissue so that the opening for the hexhead screwdrivers were visible and then able to be utilized for removal of each of the 6 screws. The 2 screws at the L3 level showed the most amount of loosening those and L4 demonstrated some minimal amount of loosening left side greater than right and at the L5 level screws appeared to have fairly  solid fixation. There was soft tissue within the screw holes located at the L3 level left side greater than right felt to represent glycocalyx. There was no gross purulence though no abscess cavity  over the posterior aspect of the spine. Following the removal of hardware the spine was stressed and demonstrated no motion present from the L3 level to L5.presumably secondary to healing of the posterior fusion mass and some amount of healing of the anterior fusion masses. C-arm drape was sterilely placed and the C-arm brought into the field. Under the C-arm and a micropituitary was carefully used to examine and remove soft tissue off of the walls of the pedicle screw openings on the left and right side at the L3 level. Curettes were also used to debride the soft tissue material and it was presented as a specimen to be sent to microbiology for soft tissue culture. Gram stain was also ordered. Irrigation was then performed. A 3 mm trephine normally used for removal of hardware was used to perform a bone biopsy of via the left L3 transpedicular opening and on the right side L3 transpedicular opening. C-arm fluoroscopy was able to demonstrate both AP and lateral localization of the removal of this bone material being careful not to broach anterior portion of the vertebral body or lateral portion of the vertebral body of L3.this material representing a biopsy of the L3 vertebral body was sent to pathology for pathologic diagnosis. With this then careful debridement was performed at each of the pedicle screw openings and then each was irrigated with copious amounts of triple antibiotic solution including polymyxin medium Neosporin and Ancef. A total of 500 mL of irrigant solution was placed into the 6 individual pedicle screw openings using a 14 mm Angiocath sleeveto provide deep irrigation. Vancomycin stimulan beadswere then carefully prepared for a total of 10 mL of beads was made. Using [place] forceps as well as a bone tamp and a sleeve used for placing bone graft within the disc spaces this material representing 3 mm beads was then carefully used transpedicular on the left side into the anterior aspect of the left  vertebral body and vertebral body and posterior vertebral body filling about the anterior half of the pediclefrom posteriorly.this was tamped and placed as far anteriorly as possible observed on C-arm fluoroscopy to be in good position alignment on the left side. Right side was then similarly performed with placement transpedicular of the stimulan beads. No beads were placed in either of the pedicle screw openings and L4 or L5. Irrigation was then carried out using copious amounts of normal saline solution medium Hemovac drain 1-lead on the right side of the spinous processes and 1 on the left was then placed exiting out the lower lumbar spine connected to closed reservoir. Soft tissue carefully debrided of any devitalized tissue.  Copious amount of irrigation was then carried out and devitalized tissue debrided. Cell Saver was not  used during this case there was 200 cc blood loss.The deep paralumbar muscles were approximated to the spinous processes with 1 Vicryl sutures, the lumbodorsal fascia approximated with 0 and 1 Vicryl sutures. Subcutaneous layers were then approximated with stainless steel staples. Mepilex bandage was then applied. The exiting Hemovac drain site and carefully bandage. All instrument and sponge counts were correct. Patient was then returned to the supine position reactivated extubated and returned to the recovery room in satisfactory condition.  Benjiman Core, PA-C perform the duties of assistant surgeon during this case. He was present from the  beginning of the case to the end of the case assisting in transfer the patient from his stretcher to the OR table and back to the stretcher at the end of the case. Assisted in careful retraction and suction of the laminectomy site delicate neural structures operating under the operating room microscope. He performed closure of the incision from the fascia to the skin applying the dressing.      Basil Dess  06/22/2018, 10:39 AM

## 2018-06-22 NOTE — Progress Notes (Signed)
     Subjective: 1 Day Post-Op Procedure(s) (LRB): Removal of hardware pedicle screws and rods L3-L5, Exploration of fusion L3-L5, L3 Transpedicular Biopsy, Placement of Vancomycin Stimulan Antibiotic Beads (N/A) Awake, alert, some confusion with rooms but able to stand and ambulate in hall way. Mechanical back pain from screw loosening is better. Foley to be discontinued today.   Patient reports pain as moderate.    Objective:   VITALS:  Temp:  [97.2 F (36.2 C)-98.3 F (36.8 C)] 98.3 F (36.8 C) (10/26 0842) Pulse Rate:  [60-94] 60 (10/26 0842) Resp:  [9-20] 18 (10/26 0842) BP: (110-159)/(57-82) 110/62 (10/26 0842) SpO2:  [92 %-100 %] 99 % (10/26 0842) Weight:  [100.9 kg] 100.9 kg (10/25 2044)  Neurologically intact ABD soft Neurovascular intact Sensation intact distally Intact pulses distally Dorsiflexion/Plantar flexion intact Incision: dressing C/D/I, moderate drainage and Hemovac intact, will leave until dry due to expected soft tissue drainage from stimulan beads. PICCU line right arm.   LABS Recent Labs    06/20/18 0411 06/21/18 0345 06/22/18 0350  HGB 11.7* 11.9* 10.3*  WBC 8.5 8.7 11.6*  PLT 380 384 357   Recent Labs    06/21/18 0345 06/22/18 0350  NA 139 140  K 4.2 4.8  CL 100 104  CO2 30 32  BUN 13 9  CREATININE 0.86 0.79  GLUCOSE 154* 157*   No results for input(s): LABPT, INR in the last 72 hours.   Assessment/Plan: 1 Day Post-Op Procedure(s) (LRB): Removal of hardware pedicle screws and rods L3-L5, Exploration of fusion L3-L5, L3 Transpedicular Biopsy, Placement of Vancomycin Stimulan Antibiotic Beads (N/A)  Vertebral osteomyelitis and discitis L2, L2-3 disc and L3 and L3-4 disc. Gram negative rod seen on operative specimen, peri pedicle screw soft tissue, glycocalyx.  Advance diet Up with therapy D/C IV fluids Continue ABX therapy due to Post-op infection  Change spectrum of antibiotics as cultures may determine. My partner Dr. Allie Bossier will see Jeffrey Nelson for me tomorrow and I will see him on Monday AM. Hemovac drain to be left in place until dry. Foley out today.   [name] 06/22/2018, 10:19 AMPatient ID: Jeffrey Nelson, male   DOB: 1934/09/06, 83 y.o.   MRN: [id number]

## 2018-06-22 NOTE — Evaluation (Signed)
Occupational Therapy Evaluation Patient Details Name: Jeffrey Nelson MRN: [id number] DOB: 1934/11/28 Today's Date: 06/22/2018    History of Present Illness Patient is a 83 y/o male who presents with L1-2, 2-3, 3-4 discitis and osteomyelitis now s/p removal of hardware, exploration of fusion L3-L5 and Placement of Vancomycin Stimulan Antibiotic Beads 10/25. PMh includes CAD, DM, HTN, MI, CVA.   Clinical Impression   PTA Pt independent in ADL and mobility, still drives but not as much as he used to. Pt is currently limited by decreased access to LB for ADL, cognitive changes/confusion, generalized weakness and decreased activity tolerance. Reviewed adls and compensatory strategies for maintaining back precautions in detail. Pt educated on: clothing between brace, never sleep in brace, set an alarm at night for medication, avoid sitting for long periods of time, correct bed positioning for sleeping, correct sequence for bed mobility (reviewed - did not practice), avoiding lifting more than 5 pounds and never wash directly over incision. Verbally introduced AE for LB ADL but did not demo or practice. Pt will benefit from skilled OT in the acute setting as well as afterwards at the [facility] level (will watch for progression as Pt does have 24 hour supervision and assist from wife.)Next session focus on AE kit education as well as continued cognitive/BLT education (please take handout)     Follow Up Recommendations  Home health OT;Supervision/Assistance - 24 hour(initially)    Equipment Recommendations  3 in 1 bedside commode    Recommendations for Other Services       Precautions / Restrictions Precautions Precautions: Fall;Back Precaution Booklet Issued: No Precaution Comments: reviewed no "BLT" precautions Required Braces or Orthoses: Spinal Brace Spinal Brace: Thoracolumbosacral orthotic;Applied in sitting position Restrictions Weight Bearing Restrictions: No      Mobility Bed  Mobility               General bed mobility comments: in recliner at beginning and end of session  Transfers Overall transfer level: Needs assistance Equipment used: Rolling walker (2 wheeled) Transfers: Sit to/from Stand Sit to Stand: Min guard         General transfer comment: good hand placement and able to boost up with min guard for safety    Balance Overall balance assessment: Needs assistance Sitting-balance support: Feet supported;No upper extremity supported Sitting balance-Leahy Scale: Good     Standing balance support: During functional activity;Bilateral upper extremity supported Standing balance-Leahy Scale: Poor Standing balance comment: Reliant on BUEs for support in standing                           ADL either performed or assessed with clinical judgement   ADL Overall ADL's : Needs assistance/impaired Eating/Feeding: Independent   Grooming: Min guard;Cueing for compensatory techniques;Standing Grooming Details (indicate cue type and reason): educated on compensatory strategies for ADL for grooming to maintain back precautions Upper Body Bathing: Min guard;Sitting   Lower Body Bathing: Moderate assistance;With caregiver independent assisting;Sitting/lateral leans Lower Body Bathing Details (indicate cue type and reason): long handle sponge Upper Body Dressing : Moderate assistance Upper Body Dressing Details (indicate cue type and reason): to don brace sitting EOB - education provided Lower Body Dressing: Moderate assistance;With caregiver independent assisting;Sit to/from stand Lower Body Dressing Details (indicate cue type and reason): unable to cross feet to knees Toilet Transfer: Min guard;Ambulation;RW;Cueing for safety Toilet Transfer Details (indicate cue type and reason): min cues for precautions and RW management Toileting- Clothing Manipulation and Hygiene: Moderate  assistance;Sit to/from stand       Functional mobility during  ADLs: Min guard;Rolling walker General ADL Comments: verbally educated on AE for LB ADL     Vision Patient Visual Report: No change from baseline       Perception     Praxis      Pertinent Vitals/Pain Pain Assessment: Faces Faces Pain Scale: Hurts little more Pain Location: back at surgical site Pain Descriptors / Indicators: Operative site guarding;Sore Pain Intervention(s): Monitored during session;Repositioned     Hand Dominance Right   Extremity/Trunk Assessment Upper Extremity Assessment Upper Extremity Assessment: Overall WFL for tasks assessed;Generalized weakness   Lower Extremity Assessment Lower Extremity Assessment: Defer to PT evaluation   Cervical / Trunk Assessment Cervical / Trunk Assessment: Other exceptions Cervical / Trunk Exceptions: s/p spine surgery   Communication Communication Communication: HOH   Cognition Arousal/Alertness: Awake/alert Behavior During Therapy: WFL for tasks assessed/performed Overall Cognitive Status: Impaired/Different from baseline Area of Impairment: Memory;Safety/judgement                     Memory: Decreased short-term memory   Safety/Judgement: Decreased awareness of safety;Decreased awareness of deficits     General Comments: continued mild confusion   General Comments  Wife present throughout session    Exercises     Shoulder Instructions      [place] expects to be discharged to:: Private residence Living Arrangements: Spouse/significant other Available Help at Discharge: Family;Available 24 hours/day Type of Home: House Home Access: Stairs to enter [employer] of Steps: 5 Entrance Stairs-Rails: Right Home Layout: One level     Bathroom Shower/Tub: Occupational psychologist: Handicapped height Bathroom Accessibility: Yes How Accessible: Accessible via walker Home Equipment: Tangent - 2 wheels          Prior Functioning/Environment Level of  Independence: Independent        Comments: Independent with ADLs and mobility. Not driving much.         OT Problem List: Decreased activity tolerance;Impaired balance (sitting and/or standing);Decreased cognition;Decreased safety awareness;Decreased knowledge of use of DME or AE;Decreased knowledge of precautions;Pain      OT Treatment/Interventions: Self-care/ADL training;DME and/or AE instruction;Therapeutic activities;Patient/family education;Balance training;Cognitive remediation/compensation    OT Goals(Current goals can be found in the care plan section) Acute Rehab OT Goals Patient Stated Goal: "get back to my old self" OT Goal Formulation: With patient/family Time For Goal Achievement: 07/06/18 Potential to Achieve Goals: Good ADL Goals Pt Will Perform Lower Body Bathing: with min guard assist;with caregiver independent in assisting;with adaptive equipment;sitting/lateral leans Pt Will Perform Lower Body Dressing: with min guard assist;with caregiver independent in assisting;with adaptive equipment;sit to/from stand Pt Will Transfer to Toilet: with modified independence;ambulating Pt Will Perform Toileting - Clothing Manipulation and hygiene: with min guard assist;sit to/from stand;with caregiver independent in assisting Additional ADL Goal #1: Pt will recall and maintain 3/3 back precautions at independent level during ADL activity  OT Frequency: Min 2X/week   Barriers to D/C:            Co-evaluation              AM-PAC PT "6 Clicks" Daily Activity     Outcome Measure Help from another person eating meals?: None Help from another person taking care of personal grooming?: A Little Help from another person toileting, which includes using toliet, bedpan, or urinal?: A Lot Help from another person bathing (including washing, rinsing, drying)?: A Lot Help from another  person to put on and taking off regular upper body clothing?: A Lot Help from another person to  put on and taking off regular lower body clothing?: A Lot 6 Click Score: 15   End of Session Equipment Utilized During Treatment: Gait belt;Rolling walker;Back brace Nurse Communication: Mobility status;Precautions  Activity Tolerance: Patient tolerated treatment well Patient left: in chair;with call bell/phone within reach;with family/visitor present  OT Visit Diagnosis: Unsteadiness on feet (R26.81);Other symptoms and signs involving cognitive function;Pain Pain - part of body: (back)                Time: 9892-1194 OT Time Calculation (min): 24 min Charges:  OT General Charges $OT Visit: 1 Visit OT Evaluation $OT Eval Moderate Complexity: 1 Mod OT Treatments $Self Care/Home Management : 8-22 mins  Hulda Humphrey OTR/L Acute Rehabilitation Services Pager: [phone] Office: [place] 06/22/2018, 5:34 PM

## 2018-06-23 LAB — CBC
HCT: 31.2 % — ABNORMAL LOW (ref 39.0–52.0)
Hemoglobin: 9.9 g/dL — ABNORMAL LOW (ref 13.0–17.0)
MCH: 30.9 pg (ref 26.0–34.0)
MCHC: 31.7 g/dL (ref 30.0–36.0)
MCV: 97.5 fL (ref 80.0–100.0)
PLATELETS: 347 10*3/uL (ref 150–400)
RBC: 3.2 MIL/uL — AB (ref 4.22–5.81)
RDW: 14 % (ref 11.5–15.5)
WBC: 12.9 10*3/uL — ABNORMAL HIGH (ref 4.0–10.5)
nRBC: 0 % (ref 0.0–0.2)

## 2018-06-23 LAB — BASIC METABOLIC PANEL
Anion gap: 5 (ref 5–15)
BUN: 15 mg/dL (ref 8–23)
CO2: 30 mmol/L (ref 22–32)
CREATININE: 0.91 mg/dL (ref 0.61–1.24)
Calcium: 8.7 mg/dL — ABNORMAL LOW (ref 8.9–10.3)
Chloride: 101 mmol/L (ref 98–111)
GFR calc Af Amer: >60 mL/min (ref >60)
Glucose, Bld: 163 mg/dL — ABNORMAL HIGH (ref 70–99)
Potassium: 4 mmol/L (ref 3.5–5.1)
SODIUM: 136 mmol/L (ref 135–145)

## 2018-06-23 LAB — GLUCOSE, CAPILLARY
GLUCOSE-CAPILLARY: 114 mg/dL — AB (ref 70–99)
Glucose-Capillary: 114 mg/dL — ABNORMAL HIGH (ref 70–99)
Glucose-Capillary: 120 mg/dL — ABNORMAL HIGH (ref 70–99)
Glucose-Capillary: 146 mg/dL — ABNORMAL HIGH (ref 70–99)

## 2018-06-23 LAB — VANCOMYCIN, TROUGH: VANCOMYCIN TR: 17 ug/mL (ref 15–20)

## 2018-06-23 MED ORDER — FAMOTIDINE 20 MG PO TABS
20.0000 mg | ORAL_TABLET | Freq: Two times a day (BID) | ORAL | Status: DC
  Administered 2018-06-23 – 2018-06-26 (×6): 20 mg via ORAL
  Filled 2018-06-23 (×6): qty 1

## 2018-06-23 NOTE — Progress Notes (Signed)
ID PROGRESS NOTE  Still no growth from OR culture from 10/25.    Plan on treating HW infection with vancomycin plus ceftriaxone x 6 wk for now Will finalize abtx tomorrow with one more day to see if anything grows from culture results  Jai Bear B. Drue Second MD MPH [facility] [phone]

## 2018-06-23 NOTE — Progress Notes (Signed)
Physical Therapy Treatment Patient Details Name: Jeffrey Nelson MRN: [id number] DOB: 1935/05/08 Today's Date: 06/23/2018    History of Present Illness Patient is a 83 y/o male who presents with L1-2, 2-3, 3-4 discitis and osteomyelitis now s/p removal of hardware, exploration of fusion L3-L5 and Placement of Vancomycin Stimulan Antibiotic Beads 10/25. PMh includes CAD, DM, HTN, MI, CVA.    PT Comments    Pt admitted with above diagnosis. Pt currently with functional limitations due to the deficits listed below (see PT Problem List). Pt was able to ambulate with min assist and cues for RW safety.  Incr distance.  Wife understands how to don brace and that pt should have it on when EOB, OOB or walking.  Continue PT.   Pt will benefit from skilled PT to increase their independence and safety with mobility to allow discharge to the venue listed below.     Follow Up Recommendations  Home health PT;Supervision for mobility/OOB     Equipment Recommendations  None recommended by PT    Recommendations for Other Services OT consult     Precautions / Restrictions Precautions Precautions: Fall;Back Precaution Booklet Issued: No Precaution Comments: reviewed no "BLT" precautions Required Braces or Orthoses: Spinal Brace Spinal Brace: Thoracolumbosacral orthotic;Applied in sitting position Restrictions Weight Bearing Restrictions: No    Mobility  Bed Mobility               General bed mobility comments: in recliner at beginning and end of session  Transfers Overall transfer level: Needs assistance Equipment used: Rolling walker (2 wheeled) Transfers: Sit to/from Stand Sit to Stand: Min guard         General transfer comment: good hand placement and able to boost up with min guard for safety. Needed incr time to stand from chair.   Ambulation/Gait Ambulation/Gait assistance: Min assist Gait Distance (Feet): 450 Feet Assistive device: Rolling walker (2 wheeled) Gait  Pattern/deviations: Step-through pattern;Decreased stride length;Decreased stance time - left;Decreased step length - right;Antalgic;Trunk flexed   Gait velocity interpretation: 1.31 - 2.62 ft/sec, indicative of limited community ambulator General Gait Details: Cues for upright posture and RW management/proximity. poor safety awareness using RW- difficulty with turns continued with pt needing incr cues and assist with turns due to not staying close to RW.    Stairs             Wheelchair Mobility    Modified Rankin (Stroke Patients Only)       Balance Overall balance assessment: Needs assistance Sitting-balance support: Feet supported;No upper extremity supported Sitting balance-Leahy Scale: Good Sitting balance - Comments: Assist to donn TLSO.   Standing balance support: During functional activity;Bilateral upper extremity supported Standing balance-Leahy Scale: Poor Standing balance comment: Reliant on BUEs for support in standing                            Cognition Arousal/Alertness: Awake/alert Behavior During Therapy: WFL for tasks assessed/performed Overall Cognitive Status: Impaired/Different from baseline Area of Impairment: Memory;Safety/judgement                     Memory: Decreased short-term memory   Safety/Judgement: Decreased awareness of safety;Decreased awareness of deficits     General Comments: continued mild confusion      Exercises General Exercises - Lower Extremity Long Arc Quad: AROM;Both;10 reps;Seated    General Comments General comments (skin integrity, edema, etc.): Wife present      Pertinent Vitals/Pain  Pain Assessment: Faces Faces Pain Scale: Hurts little more Pain Location: back at surgical site Pain Descriptors / Indicators: Operative site guarding;Sore Pain Intervention(s): Limited activity within patient's tolerance;Monitored during session;Premedicated before session;Repositioned    Home Living                       Prior Function            PT Goals (current goals can now be found in the care plan section) Acute Rehab PT Goals Patient Stated Goal: "get back to my old self" Progress towards PT goals: Progressing toward goals    Frequency    Min 5X/week      PT Plan Current plan remains appropriate    Co-evaluation              AM-PAC PT "6 Clicks" Daily Activity  Outcome Measure  Difficulty turning over in bed (including adjusting bedclothes, sheets and blankets)?: A Little Difficulty moving from lying on back to sitting on the side of the bed? : Unable Difficulty sitting down on and standing up from a chair with arms (e.g., wheelchair, bedside commode, etc,.)?: A Little Help needed moving to and from a bed to chair (including a wheelchair)?: A Little Help needed walking in hospital room?: A Little Help needed climbing 3-5 steps with a railing? : A Little 6 Click Score: 16    End of Session Equipment Utilized During Treatment: Back brace;Gait belt Activity Tolerance: Patient tolerated treatment well Patient left: in chair;with call bell/phone within reach;with chair alarm set;with family/visitor present Nurse Communication: Mobility status PT Visit Diagnosis: Unsteadiness on feet (R26.81);Difficulty in walking, not elsewhere classified (R26.2);Pain Pain - part of body: (back)     Time: 4098-1191 PT Time Calculation (min) (ACUTE ONLY): 26 min  Charges:  $Gait Training: 23-37 mins                     Avalee Castrellon,PT Acute Rehabilitation Services Pager:  [phone]  Office:  [phone]     Berline Lopes 06/23/2018, 5:26 PM

## 2018-06-23 NOTE — Progress Notes (Signed)
[facility]                                                                              Patient Demographics  Jeffrey Nelson, is a 83 y.o. male, DOB - 03-05-1935, ZOX:[id number]  Admit date - 06/12/2018   Admitting Physician Mauricio Annett Gula, MD  Outpatient Primary MD for the patient is Ryter-Brown, Fritzi Mandes, MD  Outpatient specialists:   LOS - 11  days   Medical records reviewed and are as summarized below:    Chief Complaint  Patient presents with  . Back Pain       Brief summary   83 y.o.malewith medical history significant ofcompression fracture of L3 lumbar vertebra with nonunion, status post lumbar spinal fusion (April, 2019),type 2 diabetes mellitus, coronary artery disease status post bypass grafting and stenting, hypertension and dyslipidemia.   Patient presented to his orthopedic surgeon with complains of worsening back pain over the last 2 weeks.  He underwent lumbar spine surgery with fusion earlier this year.  Patient was sent to the emergency department by the orthopedic surgeon.  MRI was done which raise concern for osteomyelitis at L3-L4 and L2-L3.  Patient was hospitalized for further management.  Orthopedics and infectious disease consulted.   Assessment & Plan   Principal problem L2-L3, L3-L4 discitis/osteomyelitis involving the hardware -Seen by orthopedics and ID -Underwent disc aspiration by IR on 10/18, cultures negative so far -PICC line placed.  Per ID treat for vancomycin and ceftriaxone for 6 weeks.  OR cultures 10/25 still negative.  -PICC line placed -Orthopedics following, patient underwent removal of hardware L3-L5, exploration of fusion L3-L5, biopsy L3, placement of vancomycin antibiotic beads, postop day #2, no new complaints, pain controlled with meds -worked with PT yesterday, needs home PT  Active Problems:  Acute on chronic back pain -Currently on scheduled Norco, immediate release oxycodone, morphine,  Robaxin, will continue current regimen, no changes  CAD status post CABG, angioplasty Cardiac cath in 02/2017, last stent placed in November 2017, Plavix currently on hold for surgery on 10/25 No chest pain or shortness of breath, stable  Diabetes mellitus type 2 -CBGs controlled  Constipation Continue bowel regimen, likely due to narcotics  Essential hypertension Continue beta-blocker  BPH Continue tamsulosin, finasteride   Code Status: Full CODE STATUS DVT Prophylaxis:  Lovenox  Family Communication: Discussed in detail with the patient, all imaging results, lab results explained to the patient   Disposition Plan:   Time Spent in minutes 25-minute  Procedures:  L2-L3 disc aspiration by IR on 10/18  Consultants:   Orthopedics Infectious disease IR  Antimicrobials:      Medications  Scheduled Meds: . aspirin EC  81 mg Oral Daily  . cholecalciferol  5,000 Units Oral Daily  . docusate sodium  100 mg Oral BID  . famotidine  20 mg Oral BID  . feeding supplement (ENSURE ENLIVE)  237 mL Oral TID PC & HS  . finasteride  5 mg Oral Daily  . gabapentin  300 mg Oral BID  . HYDROcodone-acetaminophen  1 tablet Oral Q6H  . insulin aspart  0-9 Units Subcutaneous TID WC  . metoprolol  tartrate  25 mg Oral BID  . pantoprazole  40 mg Oral Daily  . polyethylene glycol  17 g Oral BID  . senna  1 tablet Oral BID  . sodium chloride flush  3 mL Intravenous Q12H  . tamsulosin  0.4 mg Oral QPC supper   Continuous Infusions: . sodium chloride    . sodium chloride 100 mL/hr at 06/22/18 1800  . sodium chloride 10 mL/hr at 06/22/18 1800  . cefTRIAXone (ROCEPHIN)  IV Stopped (06/22/18 1307)  . methocarbamol (ROBAXIN) IV    . vancomycin 1,250 mg (06/23/18 0628)   PRN Meds:.sodium chloride, acetaminophen **OR** acetaminophen, bisacodyl, cyclobenzaprine, menthol-cetylpyridinium **OR** phenol, methocarbamol **OR** methocarbamol (ROBAXIN) IV, morphine injection, ondansetron **OR**  ondansetron (ZOFRAN) IV, oxyCODONE, oxyCODONE, polyethylene glycol, simethicone, sodium chloride flush, sodium chloride flush, sodium phosphate, sodium phosphate, traZODone   Antibiotics   Anti-infectives (From admission, onward)   Start     Dose/Rate Route Frequency Ordered Stop   06/21/18 1238  bacitracin 50,000 units, gentamicin 80mg  & cefazolin 1 g in normal saline ( )  Status:  Discontinued       As needed 06/21/18 1238 06/21/18 1238   06/21/18 1152  vancomycin (VANCOCIN) powder  Status:  Discontinued       As needed 06/21/18 1152 06/21/18 1211   06/21/18 1045  bacitracin 50,000 Units, gentamicin (GARAMYCIN) 80 mg, ceFAZolin (ANCEF) 1 g in sodium chloride 0.9 % 1,000 mL  Status:  Discontinued      Irrigation Once 06/21/18 1038 06/21/18 1443   06/17/18 1700  vancomycin (VANCOCIN) 1,250 mg in sodium chloride 0.9 % 250 mL IVPB     1,250 mg 166.7 mL/hr over 90 Minutes Intravenous Every 12 hours 06/17/18 1626     06/14/18 1315  vancomycin (VANCOCIN) IVPB 1000 mg/200 mL premix  Status:  Discontinued     1,000 mg 200 mL/hr over 60 Minutes Intravenous Every 12 hours 06/14/18 1303 06/17/18 1626   06/14/18 1300  cefTRIAXone (ROCEPHIN) 2 g in sodium chloride 0.9 % 100 mL IVPB     2 g 200 mL/hr over 30 Minutes Intravenous Every 24 hours 06/14/18 1258     06/13/18 0830  vancomycin (VANCOCIN) 1,250 mg in sodium chloride 0.9 % 250 mL IVPB  Status:  Discontinued     1,250 mg 166.7 mL/hr over 90 Minutes Intravenous Every 12 hours 06/12/18 2012 06/13/18 1418   06/13/18 0800  piperacillin-tazobactam (ZOSYN) IVPB 3.375 g  Status:  Discontinued     3.375 g 12.5 mL/hr over 240 Minutes Intravenous Every 8 hours 06/12/18 2012 06/13/18 1418   06/12/18 2030  piperacillin-tazobactam (ZOSYN) IVPB 3.375 g     3.375 g 100 mL/hr over 30 Minutes Intravenous  Once 06/12/18 2008 06/12/18 2320   06/12/18 2030  vancomycin (VANCOCIN) 2,000 mg in sodium chloride 0.9 % 500 mL IVPB     2,000 mg 250 mL/hr over 120  Minutes Intravenous  Once 06/12/18 2008 06/12/18 2331        Subjective:   [name] was seen and examined today.  Sitting up in the chair, states pain in the back 8/10, worse when he moves.  No acute issues overnight.  No fevers or chills.  Patient denies dizziness, chest pain, shortness of breath, abdominal pain, N/V/D/C.  Objective:   Vitals:   06/22/18 1710 06/22/18 2004 06/23/18 0418 06/23/18 0747  BP: 126/61 (!) 125/56 138/67 (!) 137/59  Pulse: 74 78 84 89  Resp: 18 20 20 18   Temp: 98.4 F (36.9 C) 98.4  F (36.9 C) 98.2 F (36.8 C) 98.6 F (37 C)  TempSrc: Oral Oral Oral Oral  SpO2: 95% 97% 96% 92%  Weight:        Intake/Output Summary (Last 24 hours) at 06/23/2018 1226 Last data filed at 06/23/2018 0900 Gross per 24 hour  Intake 3659.37 ml  Output 1000 ml  Net 2659.37 ml     Wt Readings from Last 3 Encounters:  06/21/18 100.9 kg  06/12/18 99.3 kg  05/29/18 99.3 kg     Exam    General: Alert and oriented x 3, NAD, uncomfortable  Eyes:   HEENT:  Atraumatic, normocephalic  Cardiovascular: S1 S2 auscultated, RRR no pedal edema b/l  Respiratory: Clear to auscultation bilaterally  Gastrointestinal: Soft, nontender, nondistended, + bowel sounds  Ext: no pedal edema bilaterally  Neuro: no new FND's, strength 5/5 in bilateral lower extremity  Musculoskeletal: No digital cyanosis, clubbing  Skin: No rashes  Psych: Normal affect and demeanor, alert and oriented x3    Data Reviewed:  I have personally reviewed following labs and imaging studies  Micro Results Recent Results (from the past 240 hour(s))  Aerobic/Anaerobic Culture (surgical/deep wound)     Status: None   Collection Time: 06/14/18 10:20 AM  Result Value Ref Range Status   Specimen Description BACK  Final   Special Requests DISC  Final   Gram Stain   Final    FEW WBC PRESENT,BOTH PMN AND MONONUCLEAR NO ORGANISMS SEEN    Culture   Final    No growth aerobically or  anaerobically. Performed at [facility], [address], [place], [place] [address]    Report Status 06/19/2018 FINAL  Final  Surgical pcr screen     Status: None   Collection Time: 06/20/18 11:47 PM  Result Value Ref Range Status   MRSA, PCR NEGATIVE NEGATIVE Final   Staphylococcus aureus NEGATIVE NEGATIVE Final    Comment: (NOTE) The Xpert SA Assay (FDA approved for NASAL specimens in patients 53 years of age and older), is one component of a comprehensive surveillance program. It is not intended to diagnose infection nor to guide or monitor treatment. Performed at [facility], [address] [place], [place] [address]   Aerobic/Anaerobic Culture (surgical/deep wound)     Status: None (Preliminary result)   Collection Time: 06/21/18 10:08 AM  Result Value Ref Range Status   Specimen Description BONE  Final   Special Requests NONE  Final   Gram Stain   Final    RARE WBC PRESENT, PREDOMINANTLY PMN RARE GRAM NEGATIVE RODS    Culture   Final    NO GROWTH 2 DAYS NO ANAEROBES ISOLATED; CULTURE IN PROGRESS FOR 5 DAYS Performed at [facility], [address] [place], [place] [address]    Report Status PENDING  Incomplete    Radiology Reports Dg Lumbar Spine 2-3 Views  Result Date: 06/21/2018 CLINICAL DATA:  ELECTIVE EXAM: DG C-ARM 61-120 MIN; LUMBAR SPINE - 2-3 VIEW COMPARISON:  No recent prior. FINDINGS: Postsurgical changes noted of the lumbar spine. Levels of interbody fusion or noted. Number lumbar spine is difficult due to positioning. 0 minutes 29 seconds fluoroscopy time utilized. IMPRESSION: Postsurgical changes lumbar spine. Electronically Signed   By: Maisie Fus  Register   On: 06/21/2018 13:07   Mr Lumbar Spine Wo Contrast  Result Date: 06/17/2018 CLINICAL DATA:  Discitis osteomyelitis. History of lumbar spine surgery with hardware failure and compression fracture. EXAM: MRI LUMBAR SPINE WITHOUT CONTRAST TECHNIQUE:  Multiplanar, multisequence MR  imaging of the lumbar spine was performed. No intravenous contrast was administered. COMPARISON:  MRI of the lumbar spine June 12, 2018 and CT abdomen and pelvis June 12, 2018 FINDINGS: SEGMENTATION: For the purposes of this report, the last well-formed intervertebral disc is reported as L5-S1. ALIGNMENT: Maintained lumbar lordosis. No malalignment. VERTEBRAE:Status post L3 through S1 PLIF. Redemonstration of moderate L3 fracture with approximately 50% height loss, the pedicle screws are migrated to the superior cortex, unchanged. Pedicle screw of L4 may be extending into the disc space. L3-4 subsidence and abnormal signal within the disc space at L2-3, L3-4. Mild old T12 compression fracture. CONUS MEDULLARIS AND CAUDA EQUINA: Conus medullaris terminates at T12-L1 and demonstrates normal morphology and signal characteristics. Cauda equina is normal. PARASPINAL AND OTHER SOFT TISSUES: LEFT greater than RIGHT iliopsoas myositis without focal fluid collection. Paraspinal denervation at below the level surgical intervention. DISC LEVELS: T12-L1: No axial sequences. Stable small broad-based disc bulge without canal stenosis. Mild neural foraminal narrowing. L1-2: Small broad-based disc bulge asymmetric to the RIGHT. No canal stenosis. Mild RIGHT neural foraminal narrowing. L2-3: Disc bulge. No canal stenosis. Moderate to severe RIGHT greater LEFT neural foraminal narrowing. L3-4: PLIF and posterior decompression without canal stenosis. Soft tissue effacing the neural foramen. L4-5: PLIF. Posterior decompression. No canal stenosis. Abnormal soft tissue LEFT neural foramen. L5-S1: Transitional anatomy, sacralized L5 vertebral body. No disc bulge, canal stenosis or neural foraminal narrowing. IMPRESSION: 1. Similar appearance of L2-3 and L3-4 discitis osteomyelitis. Associated iliopsoas myositis. 2. Status post L3 through L5 PLIF and posterior decompression with hardware failure. Moderate similar L2 pathologic  fracture. 3. No canal stenosis. 4. Neural foraminal narrowing T12-L1 through L4-5; soft tissue effacing the neural foramen L2-3 through L4-5 could reflect disc material or purulent material. Electronically Signed   By: Awilda Metro M.D.   On: 06/17/2018 21:39   Mr Lumbar Spine Wo Contrast  Result Date: 06/12/2018 CLINICAL DATA:  83 year old male with increased lumbar spine pain status post fusion 6 months ago. Fractures/collapse of the L3 vertebral body. EXAM: MRI LUMBAR SPINE WITHOUT CONTRAST TECHNIQUE: Multiplanar, multisequence MR imaging of the lumbar spine was performed. No intravenous contrast was administered. COMPARISON:  CT lumbar Spine [facility] 05/14/2018. Lumbar MRI 12/05/2017, and earlier. FINDINGS: Segmentation: Hypoplastic ribs assumed T12 as on the CT and April MRI numbering. Alignment: Straightening of lumbar lordosis is stable from last month. Vertebrae: Confluent marrow edema from the posterior inferior L2 body, throughout the L3 body, and into the superior and posterior L4 body. See series 7, image 9. This has progressed from the L3-L4 level endplate edema seen in April, with moderate collapse of L3 and erosion of the L3-L4 endplates since the prior MRI. Interbody implant at L3-L4. New abnormal increased T2 and STIR signal in the L2-L3 disc, with new L2 endplate and body marrow edema since April. The L1 and L5 levels appear stable and intact. Mild chronic T12 inferior endplate compression is stable. Intact visible lower thoracic levels. Intact visible sacrum and SI joints. Conus medullaris and cauda equina: Conus extends to the T12-L1 level. No lower spinal cord or conus signal abnormality. Paraspinal and other soft tissues: Confluent left greater than right medial psoas muscle edema as seen on series 6, image 13 and series 5, images 17 (on the left) and 1 (on the right). No intramuscular fluid collection is identified. The STIR images demonstrate prominent marrow  edema in the L3 transverse processes. Superimposed postoperative changes to the  lower lumbar erector spinae muscles. Negative visible abdominal viscera. Disc levels: No Lower spinal stenosis above L2. L2-L3: New abnormal increased T2 and STIR signal within the disc. Progressed circumferential disc bulge with chronic mild to moderate facet and ligament flavum hypertrophy. New mild to moderate spinal stenosis. Chronic mild to moderate L2 foraminal stenosis greater on the right. L3-L4: Posterior and interbody fusion hardware here and prior decompression. No spinal stenosis. There appears to be soft tissue edema affecting the bilateral L3 neural foramina. L4-L5: Posterior and interbody fusion hardware plus decompression here. Mild residual right L4 foraminal stenosis related to facet hypertrophy. L5-S1: Stable and negative aside from mild facet hypertrophy. IMPRESSION: 1. Constellation of imaging findings since the 12/05/2017 MRI is most compatible with prolonged Discitis Osteomyelitis at L3-L4, and extension of Discitis Osteomyelitis to L2-L3 since April. There is bilateral L3 through L5 spinal fusion hardware in place. Associated partial collapse of L3 with L3 and L4 endplate erosion. Abnormal L2-L3 disc and L2 inferior endplates now. 2. Bilateral psoas muscle edema/inflammation. No intramuscular or epidural abscess identified. 3. Multifactorial mild to moderate spinal stenosis at L2-L3 is new since April. Electronically Signed   By: Odessa Fleming M.D.   On: 06/12/2018 16:14   Nm Bone Scan 3 Phase  Result Date: 06/06/2018 CLINICAL DATA:  SI joint pain, question spondyloarthropathy, painful loosening of hardware L3, LEFT sacral pain, prior L3-L5 fusion in April 2019, assess for sacral insufficiency fracture EXAM: NUCLEAR MEDICINE 3-PHASE BONE SCAN TECHNIQUE: Radionuclide angiographic images, immediate static blood pool images, and 3-hour delayed static images were obtained of the lumbar spine, pelvis and hips after  intravenous injection of radiopharmaceutical. RADIOPHARMACEUTICALS:  22 mCi Tc-59m MDP IV COMPARISON:  None Correlation: Lumbar spine radiographs 05/29/2018, pelvic radiograph 01/17/2018 FINDINGS: Vascular phase: Normal blood flow to the hips and pelvis Blood pool phase: Normal blood pool at the pelvis and hips. Mildly increased blood pool in the lumbar region at approximately the L3-L5 Delayed phase: Delayed images show normal tracer accumulation at the hip joints and SI joints. Normal tracer localization in the pelvis and proximal femora. No scintigraphic evidence of sacral insufficiency fracture. Diffusely increased tracer localization is seen in lumbar spine at L3-L5, predominantly at L3-L4, corresponding to the surgical levels identified on recent radiographs. These findings are consistent with the relative recent age of surgery and expected postoperative uptake. IMPRESSION: Normal scintigraphic appearance of the pelvis, SI joints, sacrum, and hip joints. Increased blood pool and delayed uptake of tracer in lower lumbar spine corresponding to prior L3-L5 fusion, with greatest uptake centered at L3-L4. Degree of uptake is consistent with preceding spine surgery. Due to postsurgical uptake, unable to exclude underlying infection. Electronically Signed   By: [name] M.D.   On: 06/06/2018 13:44   Ct Abdomen Pelvis W Contrast  Result Date: 06/12/2018 CLINICAL DATA:  Left lower quadrant pain with diarrhea. EXAM: CT ABDOMEN AND PELVIS WITH CONTRAST TECHNIQUE: Multidetector CT imaging of the abdomen and pelvis was performed using the standard protocol following bolus administration of intravenous contrast. CONTRAST:  100 cc Omnipaque 300 intravenous COMPARISON:  MRI of the lumbar spine from the same day FINDINGS: Lower chest:  Coronary calcification.  No acute finding Hepatobiliary: No focal liver abnormality.Single stone within the gallbladder which is full but shows no inflammatory changes. Mild prominence  of the biliary tree without calcified choledocholithiasis Pancreas: Unremarkable. Spleen: Unremarkable. 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 No hydronephrosis or stone. Subcentimeter low-densities in the left kidney, too small for  densitometry. Unremarkable bladder. Stomach/Bowel:  No obstruction. No appendicitis. Vascular/Lymphatic: No acute vascular abnormality. Extensive atherosclerotic calcification of the aorta. No mass or adenopathy. Reproductive:Probable central prostate nodule.  No acute finding. Other: No ascites or pneumoperitoneum. Musculoskeletal: L2-3 and L3-4 discitis osteomyelitis. There has been L3-4 PLIF. The surrounding paravertebral fat is edematous. The pedicle screws at L3 and L4 show halo in/loosening. No evident collection. There is subsequent MRI available. IMPRESSION: 1. L2-3 and L3-4 discitis osteomyelitis which involves L3-4 PLIF hardware. 2. No evidence of colitis or other inflammation to explain diarrhea. 3. Cholelithiasis. Electronically Signed   By: Marnee Spring M.D.   On: 06/12/2018 18:13   Dg C-arm 1-60 Min  Result Date: 06/21/2018 CLINICAL DATA:  ELECTIVE EXAM: DG C-ARM 61-120 MIN; LUMBAR SPINE - 2-3 VIEW COMPARISON:  No recent prior. FINDINGS: Postsurgical changes noted of the lumbar spine. Levels of interbody fusion or noted. Number lumbar spine is difficult due to positioning. 0 minutes 29 seconds fluoroscopy time utilized. IMPRESSION: Postsurgical changes lumbar spine. Electronically Signed   By: Maisie Fus  Register   On: 06/21/2018 13:07   [place] Ekg Site Rite  Result Date: 06/14/2018 If Site Rite image not attached, placement could not be confirmed due to current cardiac rhythm.  Ir Lumbar Disc Aspiration W/img Guide  Result Date: 06/14/2018 INDICATION: L2-3 discitis EXAM: L2-3 DISC ASPIRATION MEDICATIONS: The patient is currently admitted to the hospital and receiving intravenous antibiotics. The antibiotics were administered within an  appropriate time frame prior to the initiation of the procedure. ANESTHESIA/SEDATION: Fentanyl 100 mcg IV; Versed 2 mg IV Moderate Sedation Time:  10 minutes The patient was continuously monitored during the procedure by the interventional radiology nurse under my direct supervision. COMPLICATIONS: None immediate. PROCEDURE: Informed written consent was obtained from the patient after a thorough discussion of the procedural risks, benefits and alternatives. All questions were addressed. Maximal Sterile Barrier Technique was utilized including caps, mask, sterile gowns, sterile gloves, sterile drape, hand hygiene and skin antiseptic. A timeout was performed prior to the initiation of the procedure. The back was prepped and draped in a sterile fashion. 1% lidocaine was utilized for local anesthesia. Under fluoroscopic guidance, an 18 gauge needle was advanced into the L2-3 disc via right posterolateral approach. Two drops gray pus was aspirated. FINDINGS: Images document needle placement in the L2-3 disc. IMPRESSION: Successful L2-3 disc aspiration yielding 2 drops great pus. Electronically Signed   By: Jolaine Click M.D.   On: 06/14/2018 13:30    Lab Data:  CBC: Recent Labs  Lab 06/18/18 0326 06/20/18 0411 06/21/18 0345 06/22/18 0350 06/23/18 1130  WBC 7.8 8.5 8.7 11.6* 12.9*  HGB 11.4* 11.7* 11.9* 10.3* 9.9*  HCT 35.8* 36.2* 37.7* 32.3* 31.2*  MCV 95.5 95.8 96.7 96.4 97.5  PLT 369 380 384 357 347   Basic Metabolic Panel: Recent Labs  Lab 06/18/18 0326 06/19/18 0330 06/20/18 0411 06/21/18 0345 06/22/18 0350 06/23/18 1130  NA 138  --  137 139 140 136  K 3.8  --  4.2 4.2 4.8 4.0  CL 102  --  100 100 104 101  CO2 29  --  30 30 32 30  GLUCOSE 113*  --  153* 154* 157* 163*  BUN 7*  --  11 13 9 15   CREATININE 0.76 0.88 0.83 0.86 0.79 0.91  CALCIUM 8.9  --  9.3 9.5 9.2 8.7*   GFR: Estimated Creatinine Clearance: 80.8 mL/min (by C-G formula based on SCr of 0.91 mg/dL). Liver Function  Tests:  No results for input(s): AST, ALT, ALKPHOS, BILITOT, PROT, ALBUMIN in the last 168 hours. No results for input(s): LIPASE, AMYLASE in the last 168 hours. No results for input(s): AMMONIA in the last 168 hours. Coagulation Profile: No results for input(s): INR, PROTIME in the last 168 hours. Cardiac Enzymes: No results for input(s): CKTOTAL, CKMB, CKMBINDEX, TROPONINI in the last 168 hours. BNP (last 3 results) No results for input(s): PROBNP in the last 8760 hours. HbA1C: No results for input(s): HGBA1C in the last 72 hours. CBG: Recent Labs  Lab 06/22/18 0844 06/22/18 1152 06/22/18 1712 06/22/18 2240 06/23/18 0749  GLUCAP 132* 161* 122* 140* 114*   Lipid Profile: No results for input(s): CHOL, HDL, LDLCALC, TRIG, CHOLHDL, LDLDIRECT in the last 72 hours. Thyroid Function Tests: No results for input(s): TSH, T4TOTAL, FREET4, T3FREE, THYROIDAB in the last 72 hours. Anemia Panel: No results for input(s): VITAMINB12, FOLATE, FERRITIN, TIBC, IRON, RETICCTPCT in the last 72 hours. Urine analysis:    Component Value Date/Time   COLORURINE YELLOW 06/12/2018 2320   APPEARANCEUR CLEAR 06/12/2018 2320   LABSPEC >1.046 (H) 06/12/2018 2320   PHURINE 5.0 06/12/2018 2320   GLUCOSEU NEGATIVE 06/12/2018 2320   HGBUR NEGATIVE 06/12/2018 2320   BILIRUBINUR NEGATIVE 06/12/2018 2320   KETONESUR NEGATIVE 06/12/2018 2320   PROTEINUR NEGATIVE 06/12/2018 2320   UROBILINOGEN 0.2 05/07/2015 1345   NITRITE NEGATIVE 06/12/2018 2320   LEUKOCYTESUR NEGATIVE 06/12/2018 2320     Ripudeep Rai M.D. [facility] 06/23/2018, 12:26 PM  Pager: [phone] Between 7am to 7pm - call Pager - [phone]  After 7pm go to [email or web address] - password [email or web address]  Call night coverage person covering after 7pm

## 2018-06-23 NOTE — Progress Notes (Signed)
Patient ID: Jeffrey Nelson, male   DOB: 1935/08/16, 83 y.o.   MRN: [id number] No acute changes over the past 24 hours.  Does report increased back pain today.  His lower extremity exam shows no gross weakness and he moves his legs easily for me.  Hemovac still with some output.  Back dressing clean.  CBC pending - did have WBC 11.9 yesterday.  Ordered new CBC for tomorrow am.

## 2018-06-23 NOTE — Progress Notes (Addendum)
Pharmacy Antibiotic Note  Jeffrey Nelson is a 83 y.o. male admitted on 06/12/2018 with osteomyelitis.  Pharmacy has been consulted for vancomycin dosing. MRI reveals discitis osteomyelitis at L3-L4 and extension of discitis osteomyelitis L2-L3. Pt recently diagnosed w/ hardware loosening and compression fracture of L3. Patient was on vancomycin and zosyn, but these were held on 10/17 pending aspirate.   Surgery done on 10/25 with placement of abx beads. Culture gram stain showed some GNR but no growth yet. Vanc trough came back at 17 this AM on the current dose.   Plan:  Continue Vancomycin 1,250 mg IV q12h Continue Ceftriaxone 2 g IV daily Monitor and adjust abx per renal fx, C&S, vanc trough as needed Change famotidine to PO  Weight: 222 lb 6.4 oz (100.9 kg)  Temp (24hrs), Avg:98.4 F (36.9 C), Min:98.2 F (36.8 C), Max:98.6 F (37 C)  Recent Labs  Lab 06/17/18 1401 06/18/18 0326 06/19/18 0330 06/20/18 0411 06/21/18 0345 06/22/18 0350 06/23/18 0334  WBC  --  7.8  --  8.5 8.7 11.6*  --   CREATININE  --  0.76 0.88 0.83 0.86 0.79  --   VANCOTROUGH 14*  --   --   --   --   --  17    Estimated Creatinine Clearance: 91.9 mL/min (by C-G formula based on SCr of 0.79 mg/dL).    Allergies  Allergen Reactions  . Ezetimibe Other (See Comments)    Cramps   . Statins Other (See Comments)    Cramps     Antimicrobials this admission: Vancomycin 10/16>>10/17; 10/18>> Zosyn 10/16>>10/17 Ceftriaxone 10/18>>  Microbiology results: 10/16 bcx: ngtd 10/18 deep wound culture: ngtd 10/25 Tissue/bone>> gram stain (GNR) but no growth yet  [name], PharmD, [place], AAHIVP, CPP Infectious Disease Pharmacist Pager: [phone] 06/23/2018 10:56 AM

## 2018-06-24 ENCOUNTER — Inpatient Hospital Stay (HOSPITAL_COMMUNITY): Payer: Medicare HMO

## 2018-06-24 DIAGNOSIS — M4640 Discitis, unspecified, site unspecified: Secondary | ICD-10-CM

## 2018-06-24 DIAGNOSIS — M4620 Osteomyelitis of vertebra, site unspecified: Secondary | ICD-10-CM

## 2018-06-24 DIAGNOSIS — D72829 Elevated white blood cell count, unspecified: Secondary | ICD-10-CM

## 2018-06-24 DIAGNOSIS — I10 Essential (primary) hypertension: Secondary | ICD-10-CM

## 2018-06-24 DIAGNOSIS — Z978 Presence of other specified devices: Secondary | ICD-10-CM

## 2018-06-24 DIAGNOSIS — I2510 Atherosclerotic heart disease of native coronary artery without angina pectoris: Secondary | ICD-10-CM

## 2018-06-24 DIAGNOSIS — D62 Acute posthemorrhagic anemia: Secondary | ICD-10-CM

## 2018-06-24 DIAGNOSIS — Z888 Allergy status to other drugs, medicaments and biological substances status: Secondary | ICD-10-CM

## 2018-06-24 DIAGNOSIS — M4626 Osteomyelitis of vertebra, lumbar region: Secondary | ICD-10-CM

## 2018-06-24 DIAGNOSIS — M4802 Spinal stenosis, cervical region: Secondary | ICD-10-CM

## 2018-06-24 DIAGNOSIS — M4646 Discitis, unspecified, lumbar region: Secondary | ICD-10-CM

## 2018-06-24 DIAGNOSIS — E119 Type 2 diabetes mellitus without complications: Secondary | ICD-10-CM

## 2018-06-24 DIAGNOSIS — G992 Myelopathy in diseases classified elsewhere: Secondary | ICD-10-CM

## 2018-06-24 DIAGNOSIS — T84498A Other mechanical complication of other internal orthopedic devices, implants and grafts, initial encounter: Secondary | ICD-10-CM

## 2018-06-24 LAB — CBC
HEMATOCRIT: 30 % — AB (ref 39.0–52.0)
HEMOGLOBIN: 9.6 g/dL — AB (ref 13.0–17.0)
MCH: 30.7 pg (ref 26.0–34.0)
MCHC: 32 g/dL (ref 30.0–36.0)
MCV: 95.8 fL (ref 80.0–100.0)
Platelets: 335 10*3/uL (ref 150–400)
RBC: 3.13 MIL/uL — ABNORMAL LOW (ref 4.22–5.81)
RDW: 14 % (ref 11.5–15.5)
WBC: 11.6 10*3/uL — AB (ref 4.0–10.5)
nRBC: 0 % (ref 0.0–0.2)

## 2018-06-24 LAB — GLUCOSE, CAPILLARY
Glucose-Capillary: 132 mg/dL — ABNORMAL HIGH (ref 70–99)
Glucose-Capillary: 155 mg/dL — ABNORMAL HIGH (ref 70–99)
Glucose-Capillary: 143 mg/dL — ABNORMAL HIGH (ref 70–99)
Glucose-Capillary: 140 mg/dL — ABNORMAL HIGH (ref 70–99)

## 2018-06-24 MED FILL — Thrombin (Recombinant) For Soln 20000 Unit: CUTANEOUS | Qty: 1 | Status: AC

## 2018-06-24 NOTE — Progress Notes (Signed)
PHARMACY CONSULT NOTE FOR:  OUTPATIENT  PARENTERAL ANTIBIOTIC THERAPY (OPAT)  Indication: Osteomyelitis  Regimen: Vancomycin 1250 mg every 12 hours + Ceftriaxone 2 gm every 24 hours  End date: 07/23/2018  IV antibiotic discharge orders are pended. To discharging provider:  please sign these orders via discharge navigator,  Select New Orders & click on the button choice - Manage This Unsigned Work.     Thank you for allowing pharmacy to be a part of this patient's care.  Della Goo, PharmD, BCPS Infectioius Diseases Clinical Pharmacist Phone: [phone] 06/24/2018, 9:41 AM

## 2018-06-24 NOTE — Progress Notes (Signed)
Patient refused bed alarm. Falls education done. Patient and wife verbalizes understanding.

## 2018-06-24 NOTE — Progress Notes (Signed)
     Subjective: 3 Days Post-Op Procedure(s) (LRB): Removal of hardware pedicle screws and rods L3-L5, Exploration of fusion L3-L5, L3 Transpedicular Biopsy, Placement of Vancomycin Stimulan Antibiotic Beads (N/A) Back pain is worse today and yesterday. Pain with rolling in bed and transition lying to sitting and sitting to standing. Walked quite a bit on Saturday 2 days ago now more pain. Foley discontinued and he is voiding in large quantities. Using the IS. Concern that pain meds are causing confusion. Staff called wife at 0130 due to patients disorientation.   Patient reports pain as marked.    Objective:   VITALS:  Temp:  [98 F (36.7 C)-98.5 F (36.9 C)] 98.3 F (36.8 C) (10/28 0919) Pulse Rate:  [75-95] 95 (10/28 0919) Resp:  [18-19] 19 (10/28 0919) BP: (120-171)/(60-69) 162/69 (10/28 0919) SpO2:  [96 %-99 %] 99 % (10/28 0919)  Neurologically intact ABD soft Neurovascular intact Sensation intact distally Intact pulses distally Dorsiflexion/Plantar flexion intact Incision: moderate drainage Compartment soft   LABS Recent Labs    06/22/18 0350 06/23/18 1130 06/24/18 0413  HGB 10.3* 9.9* 9.6*  WBC 11.6* 12.9* 11.6*  PLT 357 347 335   Recent Labs    06/22/18 0350 06/23/18 1130  NA 140 136  K 4.8 4.0  CL 104 101  CO2 32 30  BUN 9 15  CREATININE 0.79 0.91  GLUCOSE 157* 163*   No results for input(s): LABPT, INR in the last 72 hours.   Assessment/Plan: 3 Days Post-Op Procedure(s) (LRB): Removal of hardware pedicle screws and rods L3-L5, Exploration of fusion L3-L5, L3 Transpedicular Biopsy, Placement of Vancomycin Stimulan Antibiotic Beads (N/A)  Advance diet Up with therapy Continue ABX therapy due to Post-op infection  Obtain follow up xrays AP and lateral lumbar spine portable. May need to consider full time brace. Hemovac removed today.   [name] 06/24/2018, 2:24 PMPatient ID: Jeffrey Nelson, male   DOB: 1935-01-10, 83 y.o.   MRN:  [id number]

## 2018-06-24 NOTE — Progress Notes (Signed)
Occupational Therapy Treatment Patient Details Name: Jeffrey Nelson MRN: [id number] DOB: 1934/09/27 Today's Date: 06/24/2018    History of present illness Patient is a 83 y/o male who presents with L1-2, 2-3, 3-4 discitis and osteomyelitis now s/p removal of hardware, exploration of fusion L3-L5 and Placement of Vancomycin Stimulan Antibiotic Beads 10/25. PMh includes CAD, DM, HTN, MI, CVA.   OT comments  Pt making progress towards OT goals this session. Pt seated in recliner chair with c/o 10/10 pain but reports receiving medication prior to OT arrival. No other signs or symptoms of pain noted this session. OT educated and demonstrated use of AE for self care tasks at home to maintain back precautions. Pt returned demonstrations and was able to don LB clothing with with use of LH reacher and sock aid with min cuing for technique. Education to continue. OT recommended purchase of LH sponge as well. He already has sock aid and reacher at home.   Follow Up Recommendations  Home health OT;Supervision/Assistance - 24 hour    Equipment Recommendations  3 in 1 bedside commode    Recommendations for Other Services      Precautions / Restrictions Precautions Precautions: Fall;Back Precaution Comments: reviewed "BLT" precautions Required Braces or Orthoses: Spinal Brace Spinal Brace: Thoracolumbosacral orthotic;Applied in sitting position Restrictions Weight Bearing Restrictions: No       Mobility Bed Mobility    General bed mobility comments: in recliner at beginning and end of session         ADL either performed or assessed with clinical judgement   ADL        General ADL Comments: OT educated and demonstrated use of LH reacher and sock aide to increase I in LB self care with pt returning demonstrations with min cuing for technique     Vision Patient Visual Report: No change from baseline            Cognition Arousal/Alertness: Awake/alert Behavior During Therapy:  WFL for tasks assessed/performed Overall Cognitive Status: Within Functional Limits for tasks assessed                      Pertinent Vitals/ Pain       Pain Assessment: 0-10 Pain Score: 10-Worst pain ever Pain Location: back at surgical site Pain Descriptors / Indicators: Operative site guarding;Discomfort;Dull Pain Intervention(s): Limited activity within patient's tolerance;Monitored during session;Premedicated before session         Frequency  Min 2X/week        Progress Toward Goals  OT Goals(current goals can now be found in the care plan section)  Progress towards OT goals: Progressing toward goals  Acute Rehab OT Goals Patient Stated Goal: "get back to my old self" OT Goal Formulation: With patient Time For Goal Achievement: 07/08/18 Potential to Achieve Goals: Good  Plan Discharge plan remains appropriate       AM-PAC PT "6 Clicks" Daily Activity     Outcome Measure   Help from another person eating meals?: None Help from another person taking care of personal grooming?: A Little Help from another person toileting, which includes using toliet, bedpan, or urinal?: A Little Help from another person bathing (including washing, rinsing, drying)?: A Little Help from another person to put on and taking off regular upper body clothing?: A Little Help from another person to put on and taking off regular lower body clothing?: A Lot 6 Click Score: 18    End of Session    OT Visit  Diagnosis: Unsteadiness on feet (R26.81);Other symptoms and signs involving cognitive function;Pain Pain - part of body: (back)   Activity Tolerance Patient limited by pain   Patient Left in chair;with call bell/phone within reach;with family/visitor present;with chair alarm set   Nurse Communication          Time: [phone] OT Time Calculation (min): 18 min  Charges: OT General Charges $OT Visit: 1 Visit OT Treatments $Self Care/Home Management : 8-22  mins   Tayllor Breitenstein P, MS, OTR/L 06/24/2018, 8:47 AM

## 2018-06-24 NOTE — Progress Notes (Signed)
Patient is alert and oriented to self only this morning. Found patient with his IV tubing severed and was bleeding through the severed tubing. I clamped off the PICC line and assessed the site. PICC line itself looks like it's in proper placement. Dressing is occlusive and intact. I disconnected the exposed line. Cleaned the PICC line port. Cleaned up the patient and changed out the tubing attached to the IV fluids. We also put in an order for IV team consult to assess and flush the patient's PICC.

## 2018-06-24 NOTE — Progress Notes (Signed)
Patient ID: Jeffrey Nelson, male   DOB: 08-Aug-1935, 83 y.o.   MRN: [id number]         [facility] for Infectious Disease  Date of Admission:  06/12/2018           Day 13 vancomycin        Day 11 ceftriaxone ASSESSMENT: He remains on empiric therapy for lumbar infection.  Repeat operative Gram stain showed gram-negative rods.  Cultures remain negative at 48 hours.  He will need at least 6 weeks of IV antibiotic.  He does not feel that he should be able to manage therapy at home once he is ready  PLAN: 1. Continue current antibiotics pending final blood cultures.  Diagnosis: Lumbar infection  Culture Result: Cultures negative so far but not finalized  Allergies  Allergen Reactions  . Ezetimibe Other (See Comments)    Cramps   . Statins Other (See Comments)    Cramps     OPAT Orders Discharge antibiotics: Per pharmacy protocol vancomycin and ceftriaxone pending final cultures Aim for Vancomycin trough 15-20 (unless otherwise indicated) Duration: 6 weeks End Date: 07/23/2018  [facility] Care Per Protocol:  Labs weekly while on IV antibiotics: _x_ CBC with differential _x_ BMP __ CMP _x_ CRP _x_ ESR _x_ Vancomycin trough __ CK  __ Please pull PIC at completion of IV antibiotics __ Please leave PIC in place until doctor has seen patient or been notified  Fax weekly labs to [phone]  Clinic Follow Up Appt: Follow-up in clinic in about 1 month  Principal Problem:   Vertebral osteomyelitis, chronic (HCC) Active Problems:   Spinal stenosis, lumbar region, with neurogenic claudication   Stenosis of cervical spine with myelopathy (HCC)   Diabetes mellitus type 2, uncomplicated (HCC)   Osteomyelitis (HCC)   Essential hypertension   BPH (benign prostatic hyperplasia)   Discitis of lumbar region   Loosening of hardware in spine (HCC)   Scheduled Meds: . aspirin EC  81 mg Oral Daily  . cholecalciferol  5,000 Units Oral Daily  . docusate sodium  100 mg  Oral BID  . famotidine  20 mg Oral BID  . feeding supplement (ENSURE ENLIVE)  237 mL Oral TID PC & HS  . finasteride  5 mg Oral Daily  . gabapentin  300 mg Oral BID  . HYDROcodone-acetaminophen  1 tablet Oral Q6H  . insulin aspart  0-9 Units Subcutaneous TID WC  . metoprolol tartrate  25 mg Oral BID  . pantoprazole  40 mg Oral Daily  . polyethylene glycol  17 g Oral BID  . senna  1 tablet Oral BID  . sodium chloride flush  3 mL Intravenous Q12H  . tamsulosin  0.4 mg Oral QPC supper   Continuous Infusions: . sodium chloride    . sodium chloride 100 mL/hr at 06/22/18 1800  . sodium chloride 10 mL/hr at 06/22/18 1800  . cefTRIAXone (ROCEPHIN)  IV 2 g (06/23/18 1340)  . methocarbamol (ROBAXIN) IV    . vancomycin 1,250 mg (06/24/18 0641)   PRN Meds:.sodium chloride, acetaminophen **OR** acetaminophen, bisacodyl, cyclobenzaprine, menthol-cetylpyridinium **OR** phenol, methocarbamol **OR** methocarbamol (ROBAXIN) IV, morphine injection, ondansetron **OR** ondansetron (ZOFRAN) IV, oxyCODONE, oxyCODONE, polyethylene glycol, simethicone, sodium chloride flush, sodium chloride flush, sodium phosphate, sodium phosphate, traZODone   SUBJECTIVE: He still having severe lower back pain and neck pain.  He says that he is feeling worse than he was upon admission.  His wife states that he frequently gets neck pain when he sits  up in a chair for too long.  Review of Systems: Review of Systems  Constitutional: Negative for chills, diaphoresis and fever.  Gastrointestinal: Negative for abdominal pain, diarrhea, nausea and vomiting.  Musculoskeletal: Positive for back pain and neck pain.    Allergies  Allergen Reactions  . Ezetimibe Other (See Comments)    Cramps   . Statins Other (See Comments)    Cramps     OBJECTIVE: Vitals:   06/23/18 1704 06/23/18 2128 06/24/18 0527 06/24/18 0919  BP: 120/65 (!) 171/66 138/60 (!) 162/69  Pulse: 75 94 80 95  Resp: '18 18 18 19  ' Temp: 98.5 F (36.9 C)  98.4 F (36.9 C) 98 F (36.7 C) 98.3 F (36.8 C)  TempSrc: Oral  Oral Oral  SpO2: 97% 96% 97% 99%  Weight:       Body mass index is 28.55 kg/m.  Physical Exam  Constitutional:  He is sitting up in a chair.  His wife is present.  Neck:  He is continually rubbing his neck.  Musculoskeletal:  He has clean, dry dressings on his lumbar incisions.  He has thin bloody fluid in his drain.    Lab Results Lab Results  Component Value Date   WBC 11.6 (H) 06/24/2018   HGB 9.6 (L) 06/24/2018   HCT 30.0 (L) 06/24/2018   MCV 95.8 06/24/2018   PLT 335 06/24/2018    Lab Results  Component Value Date   CREATININE 0.91 06/23/2018   BUN 15 06/23/2018   NA 136 06/23/2018   K 4.0 06/23/2018   CL 101 06/23/2018   CO2 30 06/23/2018    Lab Results  Component Value Date   ALT 22 06/12/2018   AST 20 06/12/2018   ALKPHOS 171 (H) 06/12/2018   BILITOT 1.5 (H) 06/12/2018     Microbiology: Recent Results (from the past 240 hour(s))  Aerobic/Anaerobic Culture (surgical/deep wound)     Status: None   Collection Time: 06/14/18 10:20 AM  Result Value Ref Range Status   Specimen Description BACK  Final   Special Requests DISC  Final   Gram Stain   Final    FEW WBC PRESENT,BOTH PMN AND MONONUCLEAR NO ORGANISMS SEEN    Culture   Final    No growth aerobically or anaerobically. Performed at [facility], [place] [address], [place], [place] [address]    Report Status 06/19/2018 FINAL  Final  Surgical pcr screen     Status: None   Collection Time: 06/20/18 11:47 PM  Result Value Ref Range Status   MRSA, PCR NEGATIVE NEGATIVE Final   Staphylococcus aureus NEGATIVE NEGATIVE Final    Comment: (NOTE) The Xpert SA Assay (FDA approved for NASAL specimens in patients 29 years of age and older), is one component of a comprehensive surveillance program. It is not intended to diagnose infection nor to guide or monitor treatment. Performed at [facility], [place] [address],  [place], [place] [address]   Aerobic/Anaerobic Culture (surgical/deep wound)     Status: None (Preliminary result)   Collection Time: 06/21/18 10:08 AM  Result Value Ref Range Status   Specimen Description BONE  Final   Special Requests NONE  Final   Gram Stain   Final    RARE WBC PRESENT, PREDOMINANTLY PMN RARE GRAM NEGATIVE RODS    Culture   Final    NO GROWTH 2 DAYS NO ANAEROBES ISOLATED; CULTURE IN PROGRESS FOR 5 DAYS Performed at [facility], [place] [address],  [place], [place] [address]    Report Status PENDING  Incomplete    Michel Bickers, MD [facility] for [place] [facility] [phone] pager   [phone] cell 06/24/2018, 9:43 AM

## 2018-06-24 NOTE — Progress Notes (Signed)
PROGRESS NOTE    Patient: Jeffrey Nelson                            PCP: Deloris Ping, MD                    DOB: [birth year]            DOA: 06/12/2018 GNF:[id number]             DOS: 06/24/2018, 2:21 PM   LOS: 12 days   Date of Service: The patient was seen and examined on 06/24/2018  Subjective:   Patient was seen and examined this morning, stable reporting to take few steps to the bathroom with some assist.  Wife present at bedside.  Wondering if he could qualify for rehab. No issues overnight, afebrile normotensive  Brief Narrative:   83 y.o.malewith medical history significant ofcompression fracture of L3 lumbar vertebra with nonunion, status post lumbar spinal fusion (April, 2019),type 2 diabetes mellitus, coronary artery disease status post bypass grafting and stenting, hypertension and dyslipidemia. Patient presented to his orthopedic surgeon with complains of worsening back pain over the last 2 weeks. He underwent lumbar spine surgery with fusion earlier this year. Patient was sent to the emergency department by the orthopedic surgeon. MRI was done which raise concern for osteomyelitis at L3-L4 and L2-L3. Patient was hospitalized for further management. Orthopedics and infectious disease consulted.  Principal Problem:   Vertebral osteomyelitis, chronic (HCC) Active Problems:   Spinal stenosis, lumbar region, with neurogenic claudication   Stenosis of cervical spine with myelopathy (HCC)   Diabetes mellitus type 2, uncomplicated (HCC)   Osteomyelitis (HCC)   Essential hypertension   BPH (benign prostatic hyperplasia)   Discitis of lumbar region   Loosening of hardware in spine [facility])    Assessment & Plan:   Osteomyelitis / L2-L3, L3-L4 discitis/ involving the hardware -Seen by orthopedics and ID -Underwent disc aspiration by IR on 10/18, cultures negative so far -PICC line placed.  Per ID treat for vancomycin and ceftriaxone for 6 weeks.  OR  cultures 10/25 still negative.  -PICC line placed -Orthopedics following, patient underwent removal of hardware L3-L5, exploration of fusion L3-L5, biopsy L3, placement of vancomycin antibiotic beads,  - postop day #3,  -Continue to complain about pain, will modify regimens, -Anticipating further evaluation by PT/OT today -Family concerned requesting if patient could be qualify for inpatient rehab, consultation will be placed  Active Problems:  Acute on chronic back pain -Currently on scheduled Norco, immediate release oxycodone, morphine, Robaxin, will continue current regimen, no changes  CAD status post CABG, angioplasty Cardiac cath in 02/2017, last stent placed in November 2017, Plavix currently on hold for surgery on 10/25 No chest pain or shortness of breath, stable  Diabetes mellitus type 2 -CBGs controlled  Constipation Continue bowel regimen, likely due to narcotics  Essential hypertension Continue beta-blocker  BPH Continue tamsulosin, finasteride    Code Status: Full CODE STATUS DVT Prophylaxis:  Lovenox  Family Communication: Discussed in detail with the patient, all imaging results, lab results explained to the patient   Disposition Plan:   Time Spent in minutes 25-minute  Procedures:  L2-L3 disc aspiration by IR on 10/18  Consultants:   Orthopedics Infectious disease IR    Antimicrobials:  Anti-infectives (From admission, onward)   Start     Dose/Rate Route Frequency Ordered Stop   06/21/18 1238  bacitracin 50,000 units, gentamicin 80mg  &  cefazolin 1 g in normal saline ( )  Status:  Discontinued       As needed 06/21/18 1238 06/21/18 1238   06/21/18 1152  vancomycin (VANCOCIN) powder  Status:  Discontinued       As needed 06/21/18 1152 06/21/18 1211   06/21/18 1045  bacitracin 50,000 Units, gentamicin (GARAMYCIN) 80 mg, ceFAZolin (ANCEF) 1 g in sodium chloride 0.9 % 1,000 mL  Status:  Discontinued      Irrigation Once 06/21/18  1038 06/21/18 1443   06/17/18 1700  vancomycin (VANCOCIN) 1,250 mg in sodium chloride 0.9 % 250 mL IVPB     1,250 mg 166.7 mL/hr over 90 Minutes Intravenous Every 12 hours 06/17/18 1626     06/14/18 1315  vancomycin (VANCOCIN) IVPB 1000 mg/200 mL premix  Status:  Discontinued     1,000 mg 200 mL/hr over 60 Minutes Intravenous Every 12 hours 06/14/18 1303 06/17/18 1626   06/14/18 1300  cefTRIAXone (ROCEPHIN) 2 g in sodium chloride 0.9 % 100 mL IVPB     2 g 200 mL/hr over 30 Minutes Intravenous Every 24 hours 06/14/18 1258     06/13/18 0830  vancomycin (VANCOCIN) 1,250 mg in sodium chloride 0.9 % 250 mL IVPB  Status:  Discontinued     1,250 mg 166.7 mL/hr over 90 Minutes Intravenous Every 12 hours 06/12/18 2012 06/13/18 1418   06/13/18 0800  piperacillin-tazobactam (ZOSYN) IVPB 3.375 g  Status:  Discontinued     3.375 g 12.5 mL/hr over 240 Minutes Intravenous Every 8 hours 06/12/18 2012 06/13/18 1418   06/12/18 2030  piperacillin-tazobactam (ZOSYN) IVPB 3.375 g     3.375 g 100 mL/hr over 30 Minutes Intravenous  Once 06/12/18 2008 06/12/18 2320   06/12/18 2030  vancomycin (VANCOCIN) 2,000 mg in sodium chloride 0.9 % 500 mL IVPB     2,000 mg 250 mL/hr over 120 Minutes Intravenous  Once 06/12/18 2008 06/12/18 2331       Medication:  . aspirin EC  81 mg Oral Daily  . cholecalciferol  5,000 Units Oral Daily  . docusate sodium  100 mg Oral BID  . famotidine  20 mg Oral BID  . feeding supplement (ENSURE ENLIVE)  237 mL Oral TID PC & HS  . finasteride  5 mg Oral Daily  . gabapentin  300 mg Oral BID  . HYDROcodone-acetaminophen  1 tablet Oral Q6H  . insulin aspart  0-9 Units Subcutaneous TID WC  . metoprolol tartrate  25 mg Oral BID  . pantoprazole  40 mg Oral Daily  . polyethylene glycol  17 g Oral BID  . senna  1 tablet Oral BID  . sodium chloride flush  3 mL Intravenous Q12H  . tamsulosin  0.4 mg Oral QPC supper    sodium chloride, acetaminophen **OR** acetaminophen, bisacodyl,  cyclobenzaprine, menthol-cetylpyridinium **OR** phenol, methocarbamol **OR** methocarbamol (ROBAXIN) IV, morphine injection, ondansetron **OR** ondansetron (ZOFRAN) IV, oxyCODONE, oxyCODONE, polyethylene glycol, simethicone, sodium chloride flush, sodium chloride flush, sodium phosphate, sodium phosphate, traZODone     Objective:   Vitals:   06/23/18 1704 06/23/18 2128 06/24/18 0527 06/24/18 0919  BP: 120/65 (!) 171/66 138/60 (!) 162/69  Pulse: 75 94 80 95  Resp: 18 18 18 19   Temp: 98.5 F (36.9 C) 98.4 F (36.9 C) 98 F (36.7 C) 98.3 F (36.8 C)  TempSrc: Oral  Oral Oral  SpO2: 97% 96% 97% 99%  Weight:        Intake/Output Summary (Last 24 hours) at 06/24/2018 1421 Last data  filed at 06/24/2018 1403 Gross per 24 hour  Intake 5026.05 ml  Output 3995 ml  Net 1031.05 ml   Filed Weights   06/19/18 2102 06/21/18 0552 06/21/18 2044  Weight: 93.3 kg 94.6 kg 100.9 kg     Examination:    General exam: Appears calm and comfortable  BP (!) 162/69 (BP Location: Left Arm)   Pulse 95   Temp 98.3 F (36.8 C) (Oral)   Resp 19   Wt 100.9 kg   SpO2 99%   BMI 28.55 kg/m    Physical Exam  Constitution:  Alert, cooperative, no distress,  Psychiatric: Normal and stable mood and affect, cognition intact,   HEENT: Normocephalic, PERRL, otherwise with in Normal limits  Chest:Chest symmetric Cardio vascular:  S1/S2, RRR, No murmure, No Rubs or Gallops  pulmonary: Clear to auscultation bilaterally, respirations unlabored, negative wheezes / crackles Abdomen: Soft, non-tender, non-distended, bowel sounds,no masses, no organomegaly Muscular skeletal:  General eyes weakness is noted Limited exam - in bed, able to move all 4 extremities, Normal strength,  Neuro: CNII-XII intact. , normal motor and sensation, reflexes intact  Extremities: No pitting edema lower extremities, +2 pulses  Skin: Dry, warm to touch, negative for any Rashes, No open wounds Wounds: per nursing  documentation  LABs:  CBC Latest Ref Rng & Units 06/24/2018 06/23/2018 06/22/2018  WBC 4.0 - 10.5 K/uL 11.6(H) 12.9(H) 11.6(H)  Hemoglobin 13.0 - 17.0 g/dL 1.6(X) 0.9(U) 10.3(L)  Hematocrit 39.0 - 52.0 % 30.0(L) 31.2(L) 32.3(L)  Platelets 150 - 400 K/uL 335 347 357   CMP Latest Ref Rng & Units 06/23/2018 06/22/2018 06/21/2018  Glucose 70 - 99 mg/dL 045(W) 098(J) 191(Y)  BUN 8 - 23 mg/dL 15 9 13   Creatinine 0.61 - 1.24 mg/dL 7.82 9.56 2.13  Sodium 135 - 145 mmol/L 136 140 139  Potassium 3.5 - 5.1 mmol/L 4.0 4.8 4.2  Chloride 98 - 111 mmol/L 101 104 100  CO2 22 - 32 mmol/L 30 32 30  Calcium 8.9 - 10.3 mg/dL 0.8(M) 9.2 9.5  Total Protein 6.5 - 8.1 g/dL - - -  Total Bilirubin 0.3 - 1.2 mg/dL - - -  Alkaline Phos 38 - 126 U/L - - -  AST 15 - 41 U/L - - -  ALT 0 - 44 U/L - - -

## 2018-06-24 NOTE — Progress Notes (Signed)
Physical Therapy Treatment Patient Details Name: Jeffrey Nelson MRN: [id number] DOB: June 28, 1935 Today's Date: 06/24/2018    History of Present Illness Patient is a 83 y/o male who presents with L1-2, 2-3, 3-4 discitis and osteomyelitis now s/p removal of hardware, exploration of fusion L3-L5 and Placement of Vancomycin Stimulan Antibiotic Beads 10/25. PMh includes CAD, DM, HTN, MI, CVA.    PT Comments    Upon arrival, noted dried blood on pt's socks, arms, legs, and in/on chair. Pt reports he is bleeding from the incision on his back, not recalling the incident with his IV line earlier this morning. Assessed incision site and reoriented pt to situation. Focus of session was getting pt cleaned up and clean linen under him in the chair. Pt refusing to don brace, stating he is too painful to tolerate it at this time. Reinforced precautions and importance of wearing brace when OOB. Pt was able to wash his own feet and legs with min assist bilaterally to get foot over opposite knee. Pt stood with min guard assist and RW for support. Mobility limited due to pt refusal to wear brace. Will continue to follow and progress as able per POC.   Follow Up Recommendations  Home health PT;Supervision for mobility/OOB     Equipment Recommendations  None recommended by PT    Recommendations for Other Services       Precautions / Restrictions Precautions Precautions: Back;Fall Precaution Booklet Issued: No Precaution Comments: reviewed "BLT" precautions; encouraged brace wear Required Braces or Orthoses: Spinal Brace Spinal Brace: Thoracolumbosacral orthotic;Applied in sitting position Restrictions Weight Bearing Restrictions: No    Mobility  Bed Mobility               General bed mobility comments: in recliner at beginning and end of session  Transfers Overall transfer level: Needs assistance Equipment used: Rolling walker (2 wheeled) Transfers: Sit to/from Stand Sit to Stand: Min  guard         General transfer comment: good hand placement and able to boost up with min guard for safety. Needed incr time to stand from chair. Pt stood to get clean linen under him in chair.   Ambulation/Gait             General Gait Details: Gait training deferred as pt refusing to don brace.    Stairs             Wheelchair Mobility    Modified Rankin (Stroke Patients Only)       Balance Overall balance assessment: Needs assistance Sitting-balance support: Feet supported;No upper extremity supported Sitting balance-Leahy Scale: Good     Standing balance support: During functional activity;Bilateral upper extremity supported Standing balance-Leahy Scale: Poor Standing balance comment: Reliant on BUEs for support in standing                            Cognition Arousal/Alertness: Awake/alert Behavior During Therapy: WFL for tasks assessed/performed Overall Cognitive Status: Impaired/Different from baseline Area of Impairment: Memory;Safety/judgement                     Memory: Decreased short-term memory;Decreased recall of precautions   Safety/Judgement: Decreased awareness of safety;Decreased awareness of deficits     General Comments: Continued mild confusion. Pt did not recall the incident with his IV line this morning. Just stating "there was blood everywhere" and states he was bleeding from his incision site. Site inspected without any noted drainage.  Exercises      General Comments        Pertinent Vitals/Pain Pain Assessment: Faces Pain Score: 10-Worst pain ever Faces Pain Scale: Hurts little more Pain Location: back at surgical site Pain Descriptors / Indicators: Operative site guarding;Discomfort;Dull Pain Intervention(s): Limited activity within patient's tolerance;Monitored during session;Repositioned    Home Living                      Prior Function            PT Goals (current goals can  now be found in the care plan section) Acute Rehab PT Goals Patient Stated Goal: "I hope I can get better and get out of here." PT Goal Formulation: With patient/family Time For Goal Achievement: 07/06/18 Potential to Achieve Goals: Good Progress towards PT goals: Progressing toward goals    Frequency    Min 5X/week      PT Plan Current plan remains appropriate    Co-evaluation              AM-PAC PT "6 Clicks" Daily Activity  Outcome Measure  Difficulty turning over in bed (including adjusting bedclothes, sheets and blankets)?: A Little Difficulty moving from lying on back to sitting on the side of the bed? : Unable Difficulty sitting down on and standing up from a chair with arms (e.g., wheelchair, bedside commode, etc,.)?: A Little Help needed moving to and from a bed to chair (including a wheelchair)?: A Little Help needed walking in hospital room?: A Little Help needed climbing 3-5 steps with a railing? : A Little 6 Click Score: 16    End of Session   Activity Tolerance: Patient tolerated treatment well Patient left: in chair;with call bell/phone within reach;with chair alarm set Nurse Communication: Mobility status PT Visit Diagnosis: Unsteadiness on feet (R26.81);Difficulty in walking, not elsewhere classified (R26.2);Pain Pain - part of body: (back)     Time: 1610-9604 PT Time Calculation (min) (ACUTE ONLY): 18 min  Charges:  $Therapeutic Activity: 8-22 mins                     Conni Slipper, PT, DPT Acute Rehabilitation Services Pager: [phone] Office: [phone]    Marylynn Pearson 06/24/2018, 9:15 AM

## 2018-06-24 NOTE — Consult Note (Signed)
Physical Medicine and Rehabilitation Consult   Reason for Consult: Lumbar osteomyelitis.  Referring Physician: Dr. Flossie Dibble.    HPI: Jeffrey Nelson is a 83 y.o. male with history of CAD, HTN, T2DM, compression fracture L3 vertebrae s/p fusion force/2019 who was admitted on 06/12/2018 with worsening of back pain, fevers and night sweats.  History taken form chart review, patient, and wife. MRI lumbar spine showed prolonged discitis osteomyelitis at L3-L4 and extension of discitis to L2-L3 since April with partial collapse of L3, abnormal L2/L3 disc and L2 inferior endplate as well as bilateral psoas muscle edema/inflammation.  Blood cultures without growth.  He underwent aspiration of L2/L3 disc space with return of purulent material but cultures have shown no growth. He was started on IV antibiotics for treatment and follow up MRI 10/21 showed persistent osteomyelitis with associated iliopsoas myositis. He was taken to OR for removal of hardware with exploration of fusion, placement of antibiotic beads and bone biopsy by Dr. Otelia Sergeant on 10/21. Culture positive for rare gram negative rods and ID recommends Vancomycin and Ceftriaxone X 6 weeks pending final results. Therapy ongoing and patient noted to have flexed posture with gait deviations and poor safety awareness. He has had issues with increase in pain, moderate drainage from wound as well as confusion felt to be due to narcotics.  CIR recommended for follow up therapy.   Review of Systems  Constitutional: Negative for chills and fever.  HENT: Positive for hearing loss.   Respiratory: Negative for cough and shortness of breath.   Cardiovascular: Negative for chest pain and palpitations.  Gastrointestinal: Negative for diarrhea, heartburn and nausea.  Genitourinary: Positive for urgency. Negative for dysuria.       Nocturia 2-3 X times  Musculoskeletal: Positive for joint pain, myalgias and neck pain.  Neurological: Negative for  dizziness, sensory change and headaches.  Psychiatric/Behavioral: Negative for hallucinations. The patient is not nervous/anxious and does not have insomnia.   All other systems reviewed and are negative.   Past Medical History:  Diagnosis Date  . Anginal pain (HCC)    occ  . Arthritis    "joints" (10/09/2017)  . Chronic lower back pain   . Complication of anesthesia    extremely claustrophobic; "have to be put down for MRI; don't sit in back seat of car;, etc."  . Coronary artery disease   . High cholesterol    "get shots twice/month" (10/09/2017)  . History of blood transfusion 2001   "related to CABG"  . History of kidney stones   . HOH (hard of hearing)    wearing hearing aids  . Hypertension   . Myocardial infarction (HCC) 2000s-06/2017 X 4  . Stroke [facility]) ~ 2015   "in my right eye; sight is coming back little by little" (10/09/2017)  . Type II diabetes mellitus (HCC)     Past Surgical History:  Procedure Laterality Date  . ANTERIOR CERVICAL DECOMP/DISCECTOMY FUSION N/A 05/14/2015   Procedure: C3-4  ANTERIOR CERVICAL DISCECTOMY AND FUSION WITH PLATE AND SCREWS, LOCAL AND ALLOGRAFT BONE GRAFT;  Surgeon: Kerrin Champagne, MD;  Location: [facility] OR;  Service: Orthopedics;  Laterality: N/A;  . BACK SURGERY    . CARDIAC CATHETERIZATION     X 17 (10/09/2017)  . CATARACT EXTRACTION W/ INTRAOCULAR LENS  IMPLANT, BILATERAL Bilateral   . COLONOSCOPY    . CORONARY ANGIOPLASTY  06/2017  . CORONARY ANGIOPLASTY WITH STENT PLACEMENT     "6 stents total" (10/09/2017)  .  CORONARY ARTERY BYPASS GRAFT  2001   CABG x4   . CYSTOSCOPY W/ STONE MANIPULATION    . IR LUMBAR DISC ASPIRATION W/IMG GUIDE  06/14/2018  . JOINT REPLACEMENT    . LUMBAR LAMINECTOMY Right 10/13/2017   Procedure: MICRODISCECTOMY LUMBAR LAMINECTOMY RIGHT L3-4;  Surgeon: Kerrin Champagne, MD;  Location: [facility] OR;  Service: Orthopedics;  Laterality: Right;  . LUMBAR LAMINECTOMY Left 11/12/2017   Procedure: MICRODISCECTOMY L3-4 FOR  RECURRENT HNP POSSIBLE FLURO;  Surgeon: Kerrin Champagne, MD;  Location[facility] OR;  Service: Orthopedics;  Laterality: Left;  . LUMBAR LAMINECTOMY/DECOMPRESSION MICRODISCECTOMY N/A 01/16/2014   Procedure: Left L3-4 and L4-5 foraminotomies;  Surgeon: Kerrin Champagne, MD;  Location: [facility] OR;  Service: Orthopedics;  Laterality: N/A;  . NASAL FRACTURE SURGERY    . NASAL SINUS SURGERY     "several times since 1959"  . RADIOLOGY WITH ANESTHESIA N/A 10/12/2017   Procedure: MRI WITH ANESTHESIA;  Surgeon: Radiologist, Medication, MD;  Location: [facility] OR;  Service: Radiology;  Laterality: N/A;  . RADIOLOGY WITH ANESTHESIA N/A 12/05/2017   Procedure: MRI WITH ANESTHESIA;  Surgeon: Radiologist, Medication, MD;  Location: [facility] OR;  Service: Radiology;  Laterality: N/A;  . SHOULDER OPEN ROTATOR CUFF REPAIR Bilateral   . TOTAL KNEE ARTHROPLASTY Right 2012  . TYMPANOSTOMY TUBE PLACEMENT Bilateral   . ULNAR NERVE TRANSPOSITION Left 05/14/2015   Procedure: LEFT ULNAR NERVE DECOMPRESSION AT THE ELBOW;  Surgeon: Kerrin Champagne, MD;  Location: [facility] OR;  Service: Orthopedics;  Laterality: Left;    Family History  Problem Relation Age of Onset  . CAD Brother   . Cancer Brother   . Alzheimer's disease Mother   . Healthy Daughter   . Healthy Daughter   . Diabetes Neg Hx     Social History:  Married. Independent with walker PTA. He  reports that he quit smoking about 61 years ago. He has a 2.00 pack-year smoking history. He has never used smokeless tobacco. He reports that he does not drink alcohol or use drugs.    Allergies  Allergen Reactions  . Ezetimibe Other (See Comments)    Cramps   . Statins Other (See Comments)    Cramps    Facility-Administered Medications Prior to Admission  Medication Dose Route Frequency Provider Last Rate Last Dose  . [EXPIRED] ketorolac (TORADOL) 30 MG/ML injection 30 mg  30 mg Intramuscular Once Kerrin Champagne, MD      . tapentadol (NUCYNTA) tablet 100 mg  100 mg Oral Q4H PRN Kerrin Champagne, MD       Medications Prior to Admission  Medication Sig Dispense Refill  . Acetaminophen (TYLENOL) 325 MG CAPS Take 650 mg by mouth as needed (PAIN).     Marland Kitchen aspirin EC 81 MG tablet Take 81 mg by mouth daily.    . baclofen (LIORESAL) 10 MG tablet Take 0.5-1 tablets (5-10 mg total) by mouth 3 (three) times daily as needed for muscle spasms. 60 each 3  . Cholecalciferol (VITAMIN D3) 5000 units CAPS Take 5,000 Units by mouth daily.     . clopidogrel (PLAVIX) 75 MG tablet Take 75 mg by mouth once.     . Cranberry 400 MG CAPS Take 400 mg by mouth daily.     . Evolocumab (REPATHA) 140 MG/ML SOSY Inject 140 mg into the skin every 14 (fourteen) days.    . finasteride (PROSCAR) 5 MG tablet Take 5 mg by mouth daily.     Marland Kitchen gabapentin (NEURONTIN) 300  MG capsule Take 1 capsule (300 mg total) by mouth 2 (two) times daily. 90 capsule 0  . metoprolol tartrate (LOPRESSOR) 50 MG tablet Take 1 tablet (50 mg total) by mouth 2 (two) times daily. (Patient taking differently: Take 25 mg by mouth 2 (two) times daily. ) 30 tablet 11  . tamsulosin (FLOMAX) 0.4 MG CAPS capsule Take 0.4 mg by mouth at bedtime.     . Tapentadol HCl (NUCYNTA) 100 MG TABS Take one tablet every 6 hour prn pain 30 tablet 0  . zolpidem (AMBIEN) 10 MG tablet Take 1 tablet (10 mg total) by mouth at bedtime as needed for sleep. 30 tablet 0  . gabapentin (NEURONTIN) 300 MG capsule TAKE 1 CAPSULE BY MOUTH THREE TIMES DAILY (Patient not taking: Reported on 06/12/2018) 90 capsule 0  . linaclotide (LINZESS) 145 MCG CAPS capsule Take 1 capsule (145 mcg total) by mouth daily before breakfast. 30 capsule 0  . ondansetron (ZOFRAN) 4 MG tablet Take 1 tablet (4 mg total) by mouth every 8 (eight) hours as needed for nausea or vomiting. (Patient not taking: Reported on 06/12/2018) 20 tablet 0  . simethicone (MYLICON) 40 MG/0.6ML drops Take 0.6 mLs (40 mg total) by mouth 4 (four) times daily as needed for flatulence. 30 mL 0  . Teriparatide, Recombinant, 600  MCG/2.4ML SOLN Inject 0.08 mLs (20 mcg total) into the skin daily. 2.24 mL 5  . Vitamin D, Ergocalciferol, (DRISDOL) 50000 units CAPS capsule Take 1 capsule (50,000 Units total) by mouth every 7 (seven) days. (Patient not taking: Reported on 06/12/2018) 5 capsule 2    Home: Home Living Family/patient expects to be discharged to:: Private residence Living Arrangements: Spouse/significant other Available Help at Discharge: Family, Available 24 hours/day Type of Home: House Home Access: Stairs to enter [employer] of Steps: 5 Entrance Stairs-Rails: Right Home Layout: One level Bathroom Shower/Tub: Health visitor: Handicapped height Bathroom Accessibility: Yes Home Equipment: Environmental consultant - 2 wheels  Functional History: Prior Function Level of Independence: Independent Comments: Independent with ADLs and mobility. Not driving much.  Functional Status:  Mobility: Bed Mobility General bed mobility comments: in recliner at beginning and end of session Transfers Overall transfer level: Needs assistance Equipment used: Rolling walker (2 wheeled) Transfers: Sit to/from Stand Sit to Stand: Min guard General transfer comment: good hand placement and able to boost up with min guard for safety. Needed incr time to stand from chair. Pt stood to get clean linen under him in chair.  Ambulation/Gait Ambulation/Gait assistance: Min assist Gait Distance (Feet): 450 Feet Assistive device: Rolling walker (2 wheeled) Gait Pattern/deviations: Step-through pattern, Decreased stride length, Decreased stance time - left, Decreased step length - right, Antalgic, Trunk flexed General Gait Details: Gait training deferred as pt refusing to don brace.  Gait velocity interpretation: 1.31 - 2.62 ft/sec, indicative of limited community ambulator    ADL: ADL Overall ADL's : Needs assistance/impaired Eating/Feeding: Independent Grooming: Min guard, Cueing for compensatory techniques,  Standing Grooming Details (indicate cue type and reason): educated on compensatory strategies for ADL for grooming to maintain back precautions Upper Body Bathing: Min guard, Sitting Lower Body Bathing: Moderate assistance, With caregiver independent assisting, Sitting/lateral leans Lower Body Bathing Details (indicate cue type and reason): long handle sponge Upper Body Dressing : Moderate assistance Upper Body Dressing Details (indicate cue type and reason): to don brace sitting EOB - education provided Lower Body Dressing: Moderate assistance, With caregiver independent assisting, Sit to/from stand Lower Body Dressing Details (indicate cue type  and reason): unable to cross feet to knees Toilet Transfer: Min guard, Ambulation, RW, Cueing for safety Toilet Transfer Details (indicate cue type and reason): min cues for precautions and RW management Toileting- Clothing Manipulation and Hygiene: Moderate assistance, Sit to/from stand Functional mobility during ADLs: Min guard, Rolling walker General ADL Comments: OT educated and demonstrated use of LH reacher and sock aide to increase I in LB self care with pt returning demonstrations with min cuing for technique  Cognition: Cognition Overall Cognitive Status: Impaired/Different from baseline Orientation Level: Oriented X4 Cognition Arousal/Alertness: Awake/alert Behavior During Therapy: WFL for tasks assessed/performed Overall Cognitive Status: Impaired/Different from baseline Area of Impairment: Memory, Safety/judgement Memory: Decreased short-term memory, Decreased recall of precautions Safety/Judgement: Decreased awareness of safety, Decreased awareness of deficits General Comments: Continued mild confusion. Pt did not recall the incident with his IV line this morning. Just stating "there was blood everywhere" and states he was bleeding from his incision site. Site inspected without any noted drainage.    Blood pressure (!) 162/69, pulse  95, temperature 98.3 F (36.8 C), temperature source Oral, resp. rate 19, weight 100.9 kg, SpO2 99 %. Physical Exam  Nursing note and vitals reviewed. Constitutional: He is oriented to person, place, and time. He appears well-developed and well-nourished. No distress.  HENT:  Head: Normocephalic and atraumatic.  Eyes: EOM are normal. Right eye exhibits no discharge. Left eye exhibits no discharge.  Neck: Normal range of motion. Neck supple.  Cardiovascular: Normal rate and regular rhythm.  Respiratory: Effort normal and breath sounds normal.  GI: Soft. Bowel sounds are normal. He exhibits no distension. There is no tenderness.  Musculoskeletal:  No edema or tenderness in extremities  Neurological: He is alert and oriented to person, place, and time.  Speech clear.  Able to follow basic commands without difficulty.  HOH Motor: B/l UE 5/5 proximal to distal B/l LE: HF 4/5, KE 4+/5, ADF 5/5 Sensation intact to light touch  Skin: Skin is warm and dry. He is not diaphoretic.  Psychiatric: He has a normal mood and affect. His behavior is normal.    Results for orders placed or performed during the hospital encounter of 06/12/18 (from the past 24 hour(s))  Glucose, capillary     Status: Abnormal   Collection Time: 06/23/18  5:04 PM  Result Value Ref Range   Glucose-Capillary 120 (H) 70 - 99 mg/dL  Glucose, capillary     Status: Abnormal   Collection Time: 06/23/18  9:28 PM  Result Value Ref Range   Glucose-Capillary 146 (H) 70 - 99 mg/dL  CBC     Status: Abnormal   Collection Time: 06/24/18  4:13 AM  Result Value Ref Range   WBC 11.6 (H) 4.0 - 10.5 K/uL   RBC 3.13 (L) 4.22 - 5.81 MIL/uL   Hemoglobin 9.6 (L) 13.0 - 17.0 g/dL   HCT 16.1 (L) 09.6 - 04.5 %   MCV 95.8 80.0 - 100.0 fL   MCH 30.7 26.0 - 34.0 pg   MCHC 32.0 30.0 - 36.0 g/dL   RDW 40.9 81.1 - 91.4 %   Platelets 335 150 - 400 K/uL   nRBC 0.0 0.0 - 0.2 %  Glucose, capillary     Status: Abnormal   Collection Time:  06/24/18  7:35 AM  Result Value Ref Range   Glucose-Capillary 132 (H) 70 - 99 mg/dL  Glucose, capillary     Status: Abnormal   Collection Time: 06/24/18 11:40 AM  Result Value Ref Range  Glucose-Capillary 155 (H) 70 - 99 mg/dL   No results found.  Assessment/Plan: Diagnosis: Spinal osteomyelitis (IV Vanc x6 weeks per ID) Labs and images (see above) independently reviewed.  Records reviewed and summated above.  1. Does the need for close, 24 hr/day medical supervision in concert with the patient's rehab needs make it unreasonable for this patient to be served in a less intensive setting? No  2. Co-Morbidities requiring supervision/potential complications: CAD, HTN (monitor and provide prns in accordance with increased physical exertion and pain), T2 DM (Monitor in accordance with exercise and adjust meds as necessary), leukocytosis (repeat labs, cont to monitor for signs and symptoms of infection, further workup if indicated), ABLA (transfuse to ensure appropriate perfusion for increased activity tolerance) 3. Due to bladder management, safety, skin/wound care, disease management, pain management and patient education, does the patient require 24 hr/day rehab nursing? No 4. Does the patient require coordinated care of a physician, rehab nurse, PT (1-2 hrs/day, 5 days/week) and OT (1-2 hrs/day, 5 days/week) to address physical and functional deficits in the context of the above medical diagnosis(es)? No Addressing deficits in the following areas: balance, endurance, locomotion, strength, transferring, bathing, dressing, toileting and psychosocial support 5. Can the patient actively participate in an intensive therapy program of at least 3 hrs of therapy per day at least 5 days per week? Yes 6. The potential for patient to make measurable gains while on inpatient rehab is good 7. Anticipated functional outcomes upon discharge from inpatient rehab are supervision  with PT, supervision with OT, n/a  with SLP. 8. Estimated rehab length of stay to reach the above functional goals is: 3-5 days. 9. Anticipated D/C setting: Home 10. Anticipated post D/C treatments: HH therapy and Home excercise program 11. Overall Rehab/Functional Prognosis: excellent  RECOMMENDATIONS: This patient's condition is appropriate for continued rehabilitative care in the following setting: Pt doing well with therapies, both PT/OT recommending home with [facility].  Agree with recs. Patient has agreed to participate in recommended program. Potentially Note that insurance prior authorization may be required for reimbursement for recommended care.  Comment: Rehab Admissions Coordinator to follow up.   I have personally performed a face to face diagnostic evaluation, including, but not limited to relevant history and physical exam findings, of this patient and developed relevant assessment and plan.  Additionally, I have reviewed and concur with the physician assistant's documentation above.   Maryla Morrow, MD, ABPMR Jacquelynn Cree, PA-C 06/24/2018

## 2018-06-24 NOTE — Progress Notes (Signed)
In hand off report this morning, nurse informed me of patient snapping his IV line. IV team was called and she inspect it. Vancomycin is now infusing.

## 2018-06-25 ENCOUNTER — Ambulatory Visit: Payer: Medicare HMO | Admitting: Rheumatology

## 2018-06-25 DIAGNOSIS — T8460XD Infection and inflammatory reaction due to internal fixation device of unspecified site, subsequent encounter: Secondary | ICD-10-CM

## 2018-06-25 DIAGNOSIS — M869 Osteomyelitis, unspecified: Secondary | ICD-10-CM

## 2018-06-25 DIAGNOSIS — Z95828 Presence of other vascular implants and grafts: Secondary | ICD-10-CM

## 2018-06-25 LAB — BASIC METABOLIC PANEL
Anion gap: 7 (ref 5–15)
BUN: 15 mg/dL (ref 8–23)
CALCIUM: 8.8 mg/dL — AB (ref 8.9–10.3)
CO2: 29 mmol/L (ref 22–32)
CREATININE: 0.75 mg/dL (ref 0.61–1.24)
Chloride: 102 mmol/L (ref 98–111)
Glucose, Bld: 145 mg/dL — ABNORMAL HIGH (ref 70–99)
Potassium: 4.1 mmol/L (ref 3.5–5.1)
SODIUM: 138 mmol/L (ref 135–145)

## 2018-06-25 LAB — CBC
HCT: 30.3 % — ABNORMAL LOW (ref 39.0–52.0)
HEMOGLOBIN: 9.3 g/dL — AB (ref 13.0–17.0)
MCH: 29.9 pg (ref 26.0–34.0)
MCHC: 30.7 g/dL (ref 30.0–36.0)
MCV: 97.4 fL (ref 80.0–100.0)
Platelets: 347 10*3/uL (ref 150–400)
RBC: 3.11 MIL/uL — ABNORMAL LOW (ref 4.22–5.81)
RDW: 13.8 % (ref 11.5–15.5)
WBC: 11.2 10*3/uL — AB (ref 4.0–10.5)
nRBC: 0 % (ref 0.0–0.2)

## 2018-06-25 LAB — GLUCOSE, CAPILLARY
GLUCOSE-CAPILLARY: 125 mg/dL — AB (ref 70–99)
GLUCOSE-CAPILLARY: 142 mg/dL — AB (ref 70–99)
Glucose-Capillary: 179 mg/dL — ABNORMAL HIGH (ref 70–99)
Glucose-Capillary: 125 mg/dL — ABNORMAL HIGH (ref 70–99)

## 2018-06-25 NOTE — Progress Notes (Signed)
[facility] team will continue to follow pt progress and support home infusion/home care services based on family's final decision.   If patient discharges after hours, please call [phone].   Sedalia Muta 06/25/2018, 5:02 PM

## 2018-06-25 NOTE — Progress Notes (Signed)
Inpatient Rehabilitation Admissions Coordinator  I met with patient and his daughter at bedside as I observed patient with therapy. Pt not in need of an intense inpt rehab admit at this level. We recommend Home with HH. I spoke with daughter at bedside as well as daughter on the phone. They have concerns with their Mom 's ability to manage pt at home. I discussed with SW. We will sign off at this time.  Danne Baxter, RN, MSN Rehab Admissions Coordinator [phone] 06/25/2018 2:04 PM

## 2018-06-25 NOTE — Progress Notes (Addendum)
     Subjective: 4 Days Post-Op Procedure(s) (LRB): Removal of hardware pedicle screws and rods L3-L5, Exploration of fusion L3-L5, L3 Transpedicular Biopsy, Placement of Vancomycin Stimulan Antibiotic Beads (N/A) Awake,alert and oriented x 4. Pain levels are moderate to severe but he is more mobile today compared with this past 2 days. Voiding without difficulty, dressing changed. Hemovacs out yesterday. Cultures still pending gram negative rod on gram stain but identitfication not known yet. Path of bone biopsy shows inflammation, no neoplasm.  Patient reports pain as moderate to severe.    Objective:   VITALS:  Temp:  [98 F (36.7 C)-99.2 F (37.3 C)] 98.3 F (36.8 C) (10/29 0800) Pulse Rate:  [79-97] 81 (10/29 0800) Resp:  [12-19] 14 (10/29 0800) BP: (124-172)/(57-85) 152/85 (10/29 0800) SpO2:  [92 %-98 %] 97 % (10/29 0800) Weight:  [97.1 kg] 97.1 kg (10/28 2108)  Neurologically intact ABD soft Neurovascular intact Sensation intact distally Intact pulses distally Dorsiflexion/Plantar flexion intact Incision: no drainage and dressing changed to Opsite and 4x4s. Okay to shower with this dressing. No cellulitis present   LABS Recent Labs    06/23/18 1130 06/24/18 0413 06/25/18 0319  HGB 9.9* 9.6* 9.3*  WBC 12.9* 11.6* 11.2*  PLT 347 335 347   Recent Labs    06/23/18 1130 06/25/18 0319  NA 136 138  K 4.0 4.1  CL 101 102  CO2 30 29  BUN 15 15  CREATININE 0.91 0.75  GLUCOSE 163* 145*   No results for input(s): LABPT, INR in the last 72 hours.   Assessment/Plan: 4 Days Post-Op Procedure(s) (LRB): Removal of hardware pedicle screws and rods L3-L5, Exploration of fusion L3-L5, L3 Transpedicular Biopsy, Placement of Vancomycin Stimulan Antibiotic Beads (N/A)  Advance diet Up with therapy Continue ABX therapy due to Post-op infection  SNF placement for short term rehab and antibiiotic and Continued surveillance.  I am leaving town and will be back on 11/5,  please contact my office for the orthopaedic surgeon on call or for our PA Zonia Kief PA-c if there are any questions. [phone]  "5".  I will see Jeffrey Nelson in my office in one week following his discharge.  May shower with dressing and change dressing post shower if needed.  [name] 06/25/2018, 10:38 AMPatient ID: Jeffrey Nelson, male   DOB: 03-Sep-1934, 83 y.o.   MRN: [id number]

## 2018-06-25 NOTE — Progress Notes (Signed)
Physical Therapy Treatment Patient Details Name: Jeffrey Nelson MRN: [id number] DOB: March 24, 1935 Today's Date: 06/25/2018    History of Present Illness Patient is a 83 y/o male who presents with L1-2, 2-3, 3-4 discitis and osteomyelitis now s/p removal of hardware, exploration of fusion L3-L5 and Placement of Vancomycin Stimulan Antibiotic Beads 10/25. PMh includes CAD, DM, HTN, MI, CVA.    PT Comments    Pt presented supine, HOB elevated and alert. Pt was motivated to complete physical therapy and wanted to get up and walk. Pt denied dizziness or lightheadedness during transitional movements, including supine to sit and sit to stand. Pt needed verbal cueing in order to decreased flexed posture and to maintain upright posture. Although pt doing well, it appears family has concerns about caring for pt at home. Pt/family requesting SNF, and feel this would be reasonable given limited mobility of family who would be caring for him. Pt would continue to benefit from skilled PT in order to increase strength, mobility, coordination and activity tolerance.      Follow Up Recommendations  Home health PT;Supervision for mobility/OOB     Equipment Recommendations  None recommended by PT    Recommendations for Other Services       Precautions / Restrictions Precautions Precautions: Back;Fall Precaution Booklet Issued: No Precaution Comments: Instructed on BLT precautions, instructed on log roll for bed mobility. Encouraged brace wear Required Braces or Orthoses: Spinal Brace Spinal Brace: Thoracolumbosacral orthotic;Applied in sitting position Restrictions Weight Bearing Restrictions: No    Mobility  Bed Mobility Overal bed mobility: Needs Assistance Bed Mobility: Rolling;Sidelying to Sit;Sit to Sidelying Rolling: Min guard Sidelying to sit: Min guard     Sit to sidelying: Min guard General bed mobility comments: HOB flat. Unable to complete full log roll when going from supine to  sidelying. Was unable to finish the roll, and min gaurd was performed in order to prevent trunk rotation.    Transfers Overall transfer level: Needs assistance Equipment used: Rolling walker (2 wheeled) Transfers: Sit to/from Stand Sit to Stand: Min guard         General transfer comment: verbal cueing for upright posture and vertical gaze. Verbal cueing for placement of hands on bed instead of walker when needing asssitance out of bed.   Ambulation/Gait Ambulation/Gait assistance: Min guard Gait Distance (Feet): 200 Feet Assistive device: Rolling walker (2 wheeled) Gait Pattern/deviations: Step-through pattern;Decreased step length - right;Decreased step length - left Gait velocity: decreased  Gait velocity interpretation: 1.31 - 2.62 ft/sec, indicative of limited community ambulator General Gait Details: Pt required verbal cueing in order to maintain erect posture and gaze stability. Pt also instructed on maintaining center of mass within the limits of the walker.   Stairs Stairs: Yes Stairs assistance: Min guard Stair Management: One rail Left Number of Stairs: 4 General stair comments: Walked with step to pattern, leading with R LE during ascending. During descent, pt lead with L LE. Pt limited by IV pole connection.    Wheelchair Mobility    Modified Rankin (Stroke Patients Only)       Balance Overall balance assessment: Needs assistance Sitting-balance support: Feet supported;No upper extremity supported Sitting balance-Leahy Scale: Good     Standing balance support: During functional activity;Bilateral upper extremity supported Standing balance-Leahy Scale: Good Standing balance comment: Pt used minimal BUE for standing support  Cognition Arousal/Alertness: Awake/alert Behavior During Therapy: WFL for tasks assessed/performed Overall Cognitive Status: Within Functional Limits for tasks assessed                                         Exercises      General Comments        Pertinent Vitals/Pain Pain Assessment: 0-10 Pain Score: 7 (during ambulation ) Pain Location: back at surgical site Pain Descriptors / Indicators: Operative site guarding;Discomfort;Dull Pain Intervention(s): Monitored during session    Home Living                      Prior Function            PT Goals (current goals can now be found in the care plan section) Acute Rehab PT Goals Patient Stated Goal: "get to move and go home" PT Goal Formulation: With patient/family Time For Goal Achievement: 07/06/18 Potential to Achieve Goals: Good Progress towards PT goals: Progressing toward goals    Frequency    Min 5X/week      PT Plan Current plan remains appropriate    Co-evaluation              AM-PAC PT "6 Clicks" Daily Activity  Outcome Measure  Difficulty turning over in bed (including adjusting bedclothes, sheets and blankets)?: None Difficulty moving from lying on back to sitting on the side of the bed? : A Little Difficulty sitting down on and standing up from a chair with arms (e.g., wheelchair, bedside commode, etc,.)?: A Little Help needed moving to and from a bed to chair (including a wheelchair)?: None Help needed walking in hospital room?: None Help needed climbing 3-5 steps with a railing? : None 6 Click Score: 22    End of Session Equipment Utilized During Treatment: Gait belt;Back brace Activity Tolerance: Patient tolerated treatment well;No increased pain Patient left: in bed;with call bell/phone within reach;with family/visitor present Nurse Communication: Mobility status PT Visit Diagnosis: Unsteadiness on feet (R26.81);Difficulty in walking, not elsewhere classified (R26.2)     Time: 1610-9604 PT Time Calculation (min) (ACUTE ONLY): 23 min  Charges:  $Gait Training: 23-37 mins                     06/25/2018  Jeffrey Nelson, SPT Acute  Rehab [phone] (pager) [phone] (office)     Jeffrey Nelson 06/25/2018, 1:15 PM

## 2018-06-25 NOTE — Progress Notes (Signed)
Nutrition Follow-up  DOCUMENTATION CODES:   Not applicable  INTERVENTION:  Continue Ensure Enlive QID. Each supplement provides 350 kcal and 20 grams protein.  NUTRITION DIAGNOSIS:   Increased nutrient needs related to acute illness(discitis with osteomyelitis) as evidenced by estimated needs. Ongoing  GOAL:   Patient will meet greater than or equal to 90% of their needs Progressing  MONITOR:   PO intake, Supplement acceptance, Diet advancement  REASON FOR ASSESSMENT:   Malnutrition Screening Tool    ASSESSMENT:   [name] is an 83 yo male with PMH of type 2 diabetes,  BPH, HTN, CAD w/ CABG, spinal stenosis, s/p lumbar laminectomy admitted for L2-L3, L3-L4 discitis osteomyelitis.   10/21 MRI showed persistent osteomyelitis with associated iliopsoas myositis.  Taken to OR for removal of hardware with exploration of fusion, placement of antibiotic beads, and bone biopsy. Culture positive for rare gram negative rods; recommended IV abx x 6 weeks.  Current issues: increase in pain, moderate draining from wound, confusion from narcotics   Pt eating between 0-75% meals per chart.  Spoke with pt who reports pain somewhat better today. He says his wife brought sausage, egg, cheese biscuit this AM and he was too full to eat breakfast tray. Pt not liking hospital food. He is drinking the Ensures as given.   Pt reports recent incontinence of urine with an episode of wetting himself this AM; pt on Flomax for BPH. (cath foley discontinued) Pt reports narcotic induced constipation improved with current bowel regimen. Pt with episodes of confusion presumably from narcotics; 10/28 pt was found w/ IV tubing severed and was bleeding through the severed tubing.   Medications reviewed: vit D 5000 IU, colace, Ensure QID, ss novolog, Protonix, Miralax, senna, Flomax Labs reviewed: CBGs 125-155   Diet Order:   Diet Order            Diet regular Room service appropriate? Yes; Fluid  consistency: Thin  Diet effective now              EDUCATION NEEDS:   No education needs have been identified at this time  Skin:  Skin Assessment: Reviewed RN Assessment Skin Integrity Issues:: Incisions Incisions: back Other: puncture on lower back  Last BM:  10/28  Height:   Ht Readings from Last 1 Encounters:  06/12/18 6\' 2"  (1.88 m)    Weight:   Wt Readings from Last 1 Encounters:  06/24/18 97.1 kg    Ideal Body Weight:  86.4 kg  BMI:  Body mass index is 27.49 kg/m.  Estimated Nutritional Needs:   Kcal:  2100-2300  Protein:  105-115  Fluid:  >2.0 L    Aldora Perman, Dietetic Intern [phone]

## 2018-06-25 NOTE — Clinical Social Work Placement (Addendum)
   CLINICAL SOCIAL WORK PLACEMENT  NOTE  Date:  06/25/2018  Patient Details  Name: Jeffrey Nelson MRN: [id number] Date of Birth: 08-24-1935  Clinical Social Work is seeking post-discharge placement for this patient at the Skilled  Nursing Facility level of care (*CSW will initial, date and re-position this form in  chart as items are completed):  Yes   Patient/family provided with [employer] Clinical Social Work Department's list of facilities offering this level of care within the geographic area requested by the patient (or if unable, by the patient's family).  Yes   Patient/family informed of their freedom to choose among providers that offer the needed level of care, that participate in Medicare, Medicaid or managed care program needed by the patient, have an available bed and are willing to accept the patient.  Yes   Patient/family informed of 's ownership interest in [facility] and [facility], as well as of the fact that they are under no obligation to receive care at these facilities.  PASRR submitted to EDS on 06/25/18     PASRR number received on 06/25/18     Existing PASRR number confirmed on       FL2 transmitted to all facilities in geographic area requested by pt/family on 06/25/18     FL2 transmitted to all facilities within larger geographic area on       Patient informed that his/her managed care company has contracts with or will negotiate with certain facilities, including the following:        Yes   Patient/family informed of bed offers received on 06/25/18.  Patient chooses bed at       Physician recommends and patient chooses bed at      Patient to be transferred to   on  .  Patient to be transferred to facility by       Patient family notified on   of transfer.  Name of family member notified:        PHYSICIAN       Additional Comment:    _______________________________________________ Cristobal Goldmann,  LCSW 06/25/2018, 2:27 PM

## 2018-06-25 NOTE — Progress Notes (Signed)
    [facility]   Reason for visit: Follow up on osteomyelitis  Interval History: negative culture; remains on vancomycin and ceftriaxone.  OPAT order in.  No fever, no associated rash.  Wife and daughter at bedside   Physical Exam: Constitutional:  Vitals:   06/25/18 0432 06/25/18 0800  BP: (!) 124/57 (!) 152/85  Pulse: 86 81  Resp: 12 14  Temp: 99.2 F (37.3 C) 98.3 F (36.8 C)  SpO2: 92% 97%   patient appears in NAD Eyes: anicteric Respiratory: Normal respiratory effort; CTA B Cardiovascular: RRR GI: soft, nt, nd  Review of Systems: Constitutional: negative for fevers and chills Gastrointestinal: negative for diarrhea Integument/breast: negative for rash  Lab Results  Component Value Date   WBC 11.2 (H) 06/25/2018   HGB 9.3 (L) 06/25/2018   HCT 30.3 (L) 06/25/2018   MCV 97.4 06/25/2018   PLT 347 06/25/2018    Lab Results  Component Value Date   CREATININE 0.75 06/25/2018   BUN 15 06/25/2018   NA 138 06/25/2018   K 4.1 06/25/2018   CL 102 06/25/2018   CO2 29 06/25/2018    Lab Results  Component Value Date   ALT 22 06/12/2018   AST 20 06/12/2018   ALKPHOS 171 (H) 06/12/2018     Microbiology: Recent Results (from the past 240 hour(s))  Surgical pcr screen     Status: None   Collection Time: 06/20/18 11:47 PM  Result Value Ref Range Status   MRSA, PCR NEGATIVE NEGATIVE Final   Staphylococcus aureus NEGATIVE NEGATIVE Final    Comment: (NOTE) The Xpert SA Assay (FDA approved for NASAL specimens in patients 87 years of age and older), is one component of a comprehensive surveillance program. It is not intended to diagnose infection nor to guide or monitor treatment. Performed at [facility], [address], [place], [place] [address]   Aerobic/Anaerobic Culture (surgical/deep wound)     Status: None (Preliminary result)   Collection Time: 06/21/18 10:08 AM  Result Value Ref Range Status   Specimen Description BONE   Final   Special Requests NONE  Final   Gram Stain   Final    RARE WBC PRESENT, PREDOMINANTLY PMN RARE GRAM NEGATIVE RODS    Culture   Final    NO GROWTH 3 DAYS NO ANAEROBES ISOLATED; CULTURE IN PROGRESS FOR 5 DAYS Performed at [facility], [address] [place], [place] [address]    Report Status PENDING  Incomplete    Impression/Plan:  1. Hardware associated bone infection - hardware out.  Negative cultures.  Will continue with vancomyicin and ceftriaxone through 11/26.    2.  Medication monitoring - will need twice weekly labs per home health or SNF  3.  picc line - line in place.  Remove line at the end of treatment per SNF.    I will arrange follow up in our clinic.   I will sign off, thanks

## 2018-06-25 NOTE — Progress Notes (Signed)
[facility]                                                                              Patient Demographics  Jeffrey Nelson, is a 83 y.o. male, DOB - 03/08/1935, ZOX:[id number]  Admit date - 06/12/2018   Admitting Physician Mauricio Annett Gula, MD  Outpatient Primary MD for the patient is Ryter-Brown, Fritzi Mandes, MD  Outpatient specialists:   LOS - 13  days   Medical records reviewed and are as summarized below:    Chief Complaint  Patient presents with  . Back Pain       Brief summary   83 y.o.malewith medical history significant ofcompression fracture of L3 lumbar vertebra with nonunion, status post lumbar spinal fusion (April, 2019),type 2 diabetes mellitus, coronary artery disease status post bypass grafting and stenting, hypertension and dyslipidemia.   Patient presented to his orthopedic surgeon with complains of worsening back pain over the last 2 weeks.  He underwent lumbar spine surgery with fusion earlier this year.  Patient was sent to the emergency department by the orthopedic surgeon.  MRI was done which raise concern for osteomyelitis at L3-L4 and L2-L3.  Patient was hospitalized for further management.  Orthopedics and infectious disease consulted.   Assessment & Plan   Principal problem L2-L3, L3-L4 discitis/osteomyelitis involving the hardware -Seen by orthopedics and ID -Underwent disc aspiration by IR on 10/18, cultures negative so far -Orthopedics following, patient underwent removal of hardware L3-L5, exploration of fusion L3-L5, biopsy L3, placement of vancomycin antibiotic beads, postop day #4, no new complaints, pain controlled with meds - follow the OR cultures for sensitivities, gnr.  - Per ID, continue antibiotics for 6 weeks, vancomycin and ceftriaxone, stop date 07/23/2018.  PICC line placed -PT evaluation recommended CIR. discussed with the patient and daughter at the bedside, they are more interested in skilled nursing  facility, social work aware   Active Problems:  Acute on chronic back pain -Currently on scheduled Norco, immediate release oxycodone, morphine, Robaxin, will continue current regimen, no changes - continue bowel regimen   CAD status post CABG, angioplasty Cardiac cath in 02/2017, last stent placed in November 2017, Plavix currently on hold for surgery on 10/25 No chest pain or shortness of breath, stable  Diabetes mellitus type 2 -CBGs fairly controlled  Constipation Continue bowel regimen, likely due to narcotics  Essential hypertension Continue beta-blocker  BPH Continue tamsulosin, finasteride   Code Status: Full CODE STATUS DVT Prophylaxis:  Lovenox  Family Communication: Discussed in detail with the patient, all imaging results, lab results explained to the patient and daughter at the bedside   Disposition Plan: Patient and daughter requesting for skilled nursing facility rehab at [facility], discussed with the social work  Time Spent in minutes 25-minute  Procedures:  L2-L3 disc aspiration by IR on 10/18  Consultants:   Orthopedics Infectious disease IR  Antimicrobials:      Medications  Scheduled Meds: . aspirin EC  81 mg Oral Daily  . cholecalciferol  5,000 Units Oral Daily  . docusate sodium  100 mg Oral BID  . famotidine  20 mg Oral BID  . feeding supplement (  ENSURE ENLIVE)  237 mL Oral TID PC & HS  . finasteride  5 mg Oral Daily  . gabapentin  300 mg Oral BID  . HYDROcodone-acetaminophen  1 tablet Oral Q6H  . insulin aspart  0-9 Units Subcutaneous TID WC  . metoprolol tartrate  25 mg Oral BID  . pantoprazole  40 mg Oral Daily  . polyethylene glycol  17 g Oral BID  . senna  1 tablet Oral BID  . sodium chloride flush  3 mL Intravenous Q12H  . tamsulosin  0.4 mg Oral QPC supper   Continuous Infusions: . sodium chloride    . sodium chloride 100 mL/hr at 06/25/18 0217  . sodium chloride 10 mL/hr at 06/22/18 1800  . cefTRIAXone (ROCEPHIN)  IV  2 g (06/25/18 1342)  . methocarbamol (ROBAXIN) IV    . vancomycin 1,250 mg (06/25/18 0527)   PRN Meds:.sodium chloride, acetaminophen **OR** acetaminophen, bisacodyl, cyclobenzaprine, menthol-cetylpyridinium **OR** phenol, methocarbamol **OR** methocarbamol (ROBAXIN) IV, morphine injection, ondansetron **OR** ondansetron (ZOFRAN) IV, oxyCODONE, oxyCODONE, polyethylene glycol, simethicone, sodium chloride flush, sodium chloride flush, sodium phosphate, sodium phosphate, traZODone   Antibiotics   Anti-infectives (From admission, onward)   Start     Dose/Rate Route Frequency Ordered Stop   06/21/18 1238  bacitracin 50,000 units, gentamicin 80mg  & cefazolin 1 g in normal saline ( )  Status:  Discontinued       As needed 06/21/18 1238 06/21/18 1238   06/21/18 1152  vancomycin (VANCOCIN) powder  Status:  Discontinued       As needed 06/21/18 1152 06/21/18 1211   06/21/18 1045  bacitracin 50,000 Units, gentamicin (GARAMYCIN) 80 mg, ceFAZolin (ANCEF) 1 g in sodium chloride 0.9 % 1,000 mL  Status:  Discontinued      Irrigation Once 06/21/18 1038 06/21/18 1443   06/17/18 1700  vancomycin (VANCOCIN) 1,250 mg in sodium chloride 0.9 % 250 mL IVPB     1,250 mg 166.7 mL/hr over 90 Minutes Intravenous Every 12 hours 06/17/18 1626     06/14/18 1315  vancomycin (VANCOCIN) IVPB 1000 mg/200 mL premix  Status:  Discontinued     1,000 mg 200 mL/hr over 60 Minutes Intravenous Every 12 hours 06/14/18 1303 06/17/18 1626   06/14/18 1300  cefTRIAXone (ROCEPHIN) 2 g in sodium chloride 0.9 % 100 mL IVPB     2 g 200 mL/hr over 30 Minutes Intravenous Every 24 hours 06/14/18 1258     06/13/18 0830  vancomycin (VANCOCIN) 1,250 mg in sodium chloride 0.9 % 250 mL IVPB  Status:  Discontinued     1,250 mg 166.7 mL/hr over 90 Minutes Intravenous Every 12 hours 06/12/18 2012 06/13/18 1418   06/13/18 0800  piperacillin-tazobactam (ZOSYN) IVPB 3.375 g  Status:  Discontinued     3.375 g 12.5 mL/hr over 240 Minutes  Intravenous Every 8 hours 06/12/18 2012 06/13/18 1418   06/12/18 2030  piperacillin-tazobactam (ZOSYN) IVPB 3.375 g     3.375 g 100 mL/hr over 30 Minutes Intravenous  Once 06/12/18 2008 06/12/18 2320   06/12/18 2030  vancomycin (VANCOCIN) 2,000 mg in sodium chloride 0.9 % 500 mL IVPB     2,000 mg 250 mL/hr over 120 Minutes Intravenous  Once 06/12/18 2008 06/12/18 2331        Subjective:   Terrian Sentell was seen and examined today.  Sitting up in the chair, daughter at the bedside.  States pain is controlled when he is not moving.  No fevers or chills or any other acute issues overnight.  Patient denies dizziness, chest pain, shortness of breath, abdominal pain, N/V/D/C.  Objective:   Vitals:   06/24/18 1634 06/24/18 2108 06/25/18 0432 06/25/18 0800  BP: (!) 153/72 (!) 172/83 (!) 124/57 (!) 152/85  Pulse: 79 97 86 81  Resp: 19 16 12 14   Temp: 98 F (36.7 C) 98.5 F (36.9 C) 99.2 F (37.3 C) 98.3 F (36.8 C)  TempSrc: Oral Oral Oral Oral  SpO2: 98% 97% 92% 97%  Weight:  97.1 kg      Intake/Output Summary (Last 24 hours) at 06/25/2018 1632 Last data filed at 06/25/2018 0600 Gross per 24 hour  Intake 1317.48 ml  Output 2000 ml  Net -682.52 ml     Wt Readings from Last 3 Encounters:  06/24/18 97.1 kg  06/12/18 99.3 kg  05/29/18 99.3 kg     Exam   General: Alert and oriented x 3, NAD  Eyes:   HEENT:    Cardiovascular: S1 S2 auscultated,  RRR. No pedal edema b/l  Respiratory: Clear to auscultation bilaterally, no wheezing, rales or rhonchi  Gastrointestinal: Soft, nontender, nondistended, + bowel sounds  Ext: no pedal edema bilaterally  Neuro: bilateral lower extremity strength 5/5   musculoskeletal: No digital cyanosis, clubbing  Psych: Normal affect and demeanor, alert and oriented x3        Data Reviewed:  I have personally reviewed following labs and imaging studies  Micro Results Recent Results (from the past 240 hour(s))  Surgical pcr  screen     Status: None   Collection Time: 06/20/18 11:47 PM  Result Value Ref Range Status   MRSA, PCR NEGATIVE NEGATIVE Final   Staphylococcus aureus NEGATIVE NEGATIVE Final    Comment: (NOTE) The Xpert SA Assay (FDA approved for NASAL specimens in patients 60 years of age and older), is one component of a comprehensive surveillance program. It is not intended to diagnose infection nor to guide or monitor treatment. Performed at [facility], [address] [place], [place] [address]   Aerobic/Anaerobic Culture (surgical/deep wound)     Status: None (Preliminary result)   Collection Time: 06/21/18 10:08 AM  Result Value Ref Range Status   Specimen Description BONE  Final   Special Requests NONE  Final   Gram Stain   Final    RARE WBC PRESENT, PREDOMINANTLY PMN RARE GRAM NEGATIVE RODS    Culture   Final    NO GROWTH 4 DAYS NO ANAEROBES ISOLATED; CULTURE IN PROGRESS FOR 5 DAYS Performed at [facility], [address] [place], [place] [address]    Report Status PENDING  Incomplete    Radiology Reports Dg Lumbar Spine 2-3 Views  Result Date: 06/25/2018 CLINICAL DATA:  Assess worsening collapse of L3 or L2 post removal of pedicle screws and rods. EXAM: LUMBAR SPINE - 2-3 VIEW COMPARISON:  06/12/2018 FINDINGS: Interval removal of posterior fusion hardware from L3-L5. Skin staples vertically projecting over the mid abdomen. Interbody fusion of L3 and L4 as well as L4 and L5. Persistent moderate L3 compression fracture without significant change. Mild loss of anterior vertebral body height of T12 unchanged. Moderate spondylosis throughout the lumbar spine to include facet arthropathy. Remainder of the exam is unchanged. IMPRESSION: No acute findings. Interval removal of posterior fusion hardware from L3 to L5. Stable moderate L3 compression fracture. Moderate spondylosis of the lumbar spine. Electronically Signed   By: Elberta Fortis M.D.   On: 06/25/2018 03:10   Dg Lumbar  Spine 2-3 Views  Result  Date: 06/21/2018 CLINICAL DATA:  ELECTIVE EXAM: DG C-ARM 61-120 MIN; LUMBAR SPINE - 2-3 VIEW COMPARISON:  No recent prior. FINDINGS: Postsurgical changes noted of the lumbar spine. Levels of interbody fusion or noted. Number lumbar spine is difficult due to positioning. 0 minutes 29 seconds fluoroscopy time utilized. IMPRESSION: Postsurgical changes lumbar spine. Electronically Signed   By: Maisie Fus  Register   On: 06/21/2018 13:07   Mr Lumbar Spine Wo Contrast  Result Date: 06/17/2018 CLINICAL DATA:  Discitis osteomyelitis. History of lumbar spine surgery with hardware failure and compression fracture. EXAM: MRI LUMBAR SPINE WITHOUT CONTRAST TECHNIQUE: Multiplanar, multisequence MR imaging of the lumbar spine was performed. No intravenous contrast was administered. COMPARISON:  MRI of the lumbar spine June 12, 2018 and CT abdomen and pelvis June 12, 2018 FINDINGS: SEGMENTATION: For the purposes of this report, the last well-formed intervertebral disc is reported as L5-S1. ALIGNMENT: Maintained lumbar lordosis. No malalignment. VERTEBRAE:Status post L3 through S1 PLIF. Redemonstration of moderate L3 fracture with approximately 50% height loss, the pedicle screws are migrated to the superior cortex, unchanged. Pedicle screw of L4 may be extending into the disc space. L3-4 subsidence and abnormal signal within the disc space at L2-3, L3-4. Mild old T12 compression fracture. CONUS MEDULLARIS AND CAUDA EQUINA: Conus medullaris terminates at T12-L1 and demonstrates normal morphology and signal characteristics. Cauda equina is normal. PARASPINAL AND OTHER SOFT TISSUES: LEFT greater than RIGHT iliopsoas myositis without focal fluid collection. Paraspinal denervation at below the level surgical intervention. DISC LEVELS: T12-L1: No axial sequences. Stable small broad-based disc bulge without canal stenosis. Mild neural foraminal narrowing. L1-2: Small broad-based disc bulge asymmetric to  the RIGHT. No canal stenosis. Mild RIGHT neural foraminal narrowing. L2-3: Disc bulge. No canal stenosis. Moderate to severe RIGHT greater LEFT neural foraminal narrowing. L3-4: PLIF and posterior decompression without canal stenosis. Soft tissue effacing the neural foramen. L4-5: PLIF. Posterior decompression. No canal stenosis. Abnormal soft tissue LEFT neural foramen. L5-S1: Transitional anatomy, sacralized L5 vertebral body. No disc bulge, canal stenosis or neural foraminal narrowing. IMPRESSION: 1. Similar appearance of L2-3 and L3-4 discitis osteomyelitis. Associated iliopsoas myositis. 2. Status post L3 through L5 PLIF and posterior decompression with hardware failure. Moderate similar L2 pathologic fracture. 3. No canal stenosis. 4. Neural foraminal narrowing T12-L1 through L4-5; soft tissue effacing the neural foramen L2-3 through L4-5 could reflect disc material or purulent material. Electronically Signed   By: Awilda Metro M.D.   On: 06/17/2018 21:39   Mr Lumbar Spine Wo Contrast  Result Date: 06/12/2018 CLINICAL DATA:  83 year old male with increased lumbar spine pain status post fusion 6 months ago. Fractures/collapse of the L3 vertebral body. EXAM: MRI LUMBAR SPINE WITHOUT CONTRAST TECHNIQUE: Multiplanar, multisequence MR imaging of the lumbar spine was performed. No intravenous contrast was administered. COMPARISON:  CT lumbar Spine [facility] 05/14/2018. Lumbar MRI 12/05/2017, and earlier. FINDINGS: Segmentation: Hypoplastic ribs assumed T12 as on the CT and April MRI numbering. Alignment: Straightening of lumbar lordosis is stable from last month. Vertebrae: Confluent marrow edema from the posterior inferior L2 body, throughout the L3 body, and into the superior and posterior L4 body. See series 7, image 9. This has progressed from the L3-L4 level endplate edema seen in April, with moderate collapse of L3 and erosion of the L3-L4 endplates since the prior MRI.  Interbody implant at L3-L4. New abnormal increased T2 and STIR signal in the L2-L3 disc, with new L2 endplate and body marrow edema since April. The L1 and  L5 levels appear stable and intact. Mild chronic T12 inferior endplate compression is stable. Intact visible lower thoracic levels. Intact visible sacrum and SI joints. Conus medullaris and cauda equina: Conus extends to the T12-L1 level. No lower spinal cord or conus signal abnormality. Paraspinal and other soft tissues: Confluent left greater than right medial psoas muscle edema as seen on series 6, image 13 and series 5, images 17 (on the left) and 1 (on the right). No intramuscular fluid collection is identified. The STIR images demonstrate prominent marrow edema in the L3 transverse processes. Superimposed postoperative changes to the lower lumbar erector spinae muscles. Negative visible abdominal viscera. Disc levels: No Lower spinal stenosis above L2. L2-L3: New abnormal increased T2 and STIR signal within the disc. Progressed circumferential disc bulge with chronic mild to moderate facet and ligament flavum hypertrophy. New mild to moderate spinal stenosis. Chronic mild to moderate L2 foraminal stenosis greater on the right. L3-L4: Posterior and interbody fusion hardware here and prior decompression. No spinal stenosis. There appears to be soft tissue edema affecting the bilateral L3 neural foramina. L4-L5: Posterior and interbody fusion hardware plus decompression here. Mild residual right L4 foraminal stenosis related to facet hypertrophy. L5-S1: Stable and negative aside from mild facet hypertrophy. IMPRESSION: 1. Constellation of imaging findings since the 12/05/2017 MRI is most compatible with prolonged Discitis Osteomyelitis at L3-L4, and extension of Discitis Osteomyelitis to L2-L3 since April. There is bilateral L3 through L5 spinal fusion hardware in place. Associated partial collapse of L3 with L3 and L4 endplate erosion. Abnormal L2-L3 disc and  L2 inferior endplates now. 2. Bilateral psoas muscle edema/inflammation. No intramuscular or epidural abscess identified. 3. Multifactorial mild to moderate spinal stenosis at L2-L3 is new since April. Electronically Signed   By: Odessa Fleming M.D.   On: 06/12/2018 16:14   Nm Bone Scan 3 Phase  Result Date: 06/06/2018 CLINICAL DATA:  SI joint pain, question spondyloarthropathy, painful loosening of hardware L3, LEFT sacral pain, prior L3-L5 fusion in April 2019, assess for sacral insufficiency fracture EXAM: NUCLEAR MEDICINE 3-PHASE BONE SCAN TECHNIQUE: Radionuclide angiographic images, immediate static blood pool images, and 3-hour delayed static images were obtained of the lumbar spine, pelvis and hips after intravenous injection of radiopharmaceutical. RADIOPHARMACEUTICALS:  22 mCi Tc-31m MDP IV COMPARISON:  None Correlation: Lumbar spine radiographs 05/29/2018, pelvic radiograph 01/17/2018 FINDINGS: Vascular phase: Normal blood flow to the hips and pelvis Blood pool phase: Normal blood pool at the pelvis and hips. Mildly increased blood pool in the lumbar region at approximately the L3-L5 Delayed phase: Delayed images show normal tracer accumulation at the hip joints and SI joints. Normal tracer localization in the pelvis and proximal femora. No scintigraphic evidence of sacral insufficiency fracture. Diffusely increased tracer localization is seen in lumbar spine at L3-L5, predominantly at L3-L4, corresponding to the surgical levels identified on recent radiographs. These findings are consistent with the relative recent age of surgery and expected postoperative uptake. IMPRESSION: Normal scintigraphic appearance of the pelvis, SI joints, sacrum, and hip joints. Increased blood pool and delayed uptake of tracer in lower lumbar spine corresponding to prior L3-L5 fusion, with greatest uptake centered at L3-L4. Degree of uptake is consistent with preceding spine surgery. Due to postsurgical uptake, unable to exclude  underlying infection. Electronically Signed   By: [name] M.D.   On: 06/06/2018 13:44   Ct Abdomen Pelvis W Contrast  Result Date: 06/12/2018 CLINICAL DATA:  Left lower quadrant pain with diarrhea. EXAM: CT ABDOMEN AND PELVIS WITH CONTRAST  TECHNIQUE: Multidetector CT imaging of the abdomen and pelvis was performed using the standard protocol following bolus administration of intravenous contrast. CONTRAST:  100 cc Omnipaque 300 intravenous COMPARISON:  MRI of the lumbar spine from the same day FINDINGS: Lower chest:  Coronary calcification.  No acute finding Hepatobiliary: No focal liver abnormality.Single stone within the gallbladder which is full but shows no inflammatory changes. Mild prominence of the biliary tree without calcified choledocholithiasis Pancreas: Unremarkable. Spleen: Unremarkable. 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 No hydronephrosis or stone. Subcentimeter low-densities in the left kidney, too small for densitometry. Unremarkable bladder. Stomach/Bowel:  No obstruction. No appendicitis. Vascular/Lymphatic: No acute vascular abnormality. Extensive atherosclerotic calcification of the aorta. No mass or adenopathy. Reproductive:Probable central prostate nodule.  No acute finding. Other: No ascites or pneumoperitoneum. Musculoskeletal: L2-3 and L3-4 discitis osteomyelitis. There has been L3-4 PLIF. The surrounding paravertebral fat is edematous. The pedicle screws at L3 and L4 show halo in/loosening. No evident collection. There is subsequent MRI available. IMPRESSION: 1. L2-3 and L3-4 discitis osteomyelitis which involves L3-4 PLIF hardware. 2. No evidence of colitis or other inflammation to explain diarrhea. 3. Cholelithiasis. Electronically Signed   By: Marnee Spring M.D.   On: 06/12/2018 18:13   Dg C-arm 1-60 Min  Result Date: 06/21/2018 CLINICAL DATA:  ELECTIVE EXAM: DG C-ARM 61-120 MIN; LUMBAR SPINE - 2-3 VIEW COMPARISON:  No recent prior. FINDINGS:  Postsurgical changes noted of the lumbar spine. Levels of interbody fusion or noted. Number lumbar spine is difficult due to positioning. 0 minutes 29 seconds fluoroscopy time utilized. IMPRESSION: Postsurgical changes lumbar spine. Electronically Signed   By: Maisie Fus  Register   On: 06/21/2018 13:07   [place] Ekg Site Rite  Result Date: 06/14/2018 If Site Rite image not attached, placement could not be confirmed due to current cardiac rhythm.  Ir Lumbar Disc Aspiration W/img Guide  Result Date: 06/14/2018 INDICATION: L2-3 discitis EXAM: L2-3 DISC ASPIRATION MEDICATIONS: The patient is currently admitted to the hospital and receiving intravenous antibiotics. The antibiotics were administered within an appropriate time frame prior to the initiation of the procedure. ANESTHESIA/SEDATION: Fentanyl 100 mcg IV; Versed 2 mg IV Moderate Sedation Time:  10 minutes The patient was continuously monitored during the procedure by the interventional radiology nurse under my direct supervision. COMPLICATIONS: None immediate. PROCEDURE: Informed written consent was obtained from the patient after a thorough discussion of the procedural risks, benefits and alternatives. All questions were addressed. Maximal Sterile Barrier Technique was utilized including caps, mask, sterile gowns, sterile gloves, sterile drape, hand hygiene and skin antiseptic. A timeout was performed prior to the initiation of the procedure. The back was prepped and draped in a sterile fashion. 1% lidocaine was utilized for local anesthesia. Under fluoroscopic guidance, an 18 gauge needle was advanced into the L2-3 disc via right posterolateral approach. Two drops gray pus was aspirated. FINDINGS: Images document needle placement in the L2-3 disc. IMPRESSION: Successful L2-3 disc aspiration yielding 2 drops great pus. Electronically Signed   By: Jolaine Click M.D.   On: 06/14/2018 13:30    Lab Data:  CBC: Recent Labs  Lab 06/21/18 0345 06/22/18 0350  06/23/18 1130 06/24/18 0413 06/25/18 0319  WBC 8.7 11.6* 12.9* 11.6* 11.2*  HGB 11.9* 10.3* 9.9* 9.6* 9.3*  HCT 37.7* 32.3* 31.2* 30.0* 30.3*  MCV 96.7 96.4 97.5 95.8 97.4  PLT 384 357 347 335 347   Basic Metabolic Panel: Recent Labs  Lab 06/20/18 0411 06/21/18 0345 06/22/18 0350 06/23/18 1130 06/25/18  0319  NA 137 139 140 136 138  K 4.2 4.2 4.8 4.0 4.1  CL 100 100 104 101 102  CO2 30 30 32 30 29  GLUCOSE 153* 154* 157* 163* 145*  BUN 11 13 9 15 15   CREATININE 0.83 0.86 0.79 0.91 0.75  CALCIUM 9.3 9.5 9.2 8.7* 8.8*   GFR: Estimated Creatinine Clearance: 84.2 mL/min (by C-G formula based on SCr of 0.75 mg/dL). Liver Function Tests: No results for input(s): AST, ALT, ALKPHOS, BILITOT, PROT, ALBUMIN in the last 168 hours. No results for input(s): LIPASE, AMYLASE in the last 168 hours. No results for input(s): AMMONIA in the last 168 hours. Coagulation Profile: No results for input(s): INR, PROTIME in the last 168 hours. Cardiac Enzymes: No results for input(s): CKTOTAL, CKMB, CKMBINDEX, TROPONINI in the last 168 hours. BNP (last 3 results) No results for input(s): PROBNP in the last 8760 hours. HbA1C: No results for input(s): HGBA1C in the last 72 hours. CBG: Recent Labs  Lab 06/24/18 1140 06/24/18 1603 06/24/18 2111 06/25/18 0719 06/25/18 1131  GLUCAP 155* 143* 140* 125* 142*   Lipid Profile: No results for input(s): CHOL, HDL, LDLCALC, TRIG, CHOLHDL, LDLDIRECT in the last 72 hours. Thyroid Function Tests: No results for input(s): TSH, T4TOTAL, FREET4, T3FREE, THYROIDAB in the last 72 hours. Anemia Panel: No results for input(s): VITAMINB12, FOLATE, FERRITIN, TIBC, IRON, RETICCTPCT in the last 72 hours. Urine analysis:    Component Value Date/Time   COLORURINE YELLOW 06/12/2018 2320   APPEARANCEUR CLEAR 06/12/2018 2320   LABSPEC >1.046 (H) 06/12/2018 2320   PHURINE 5.0 06/12/2018 2320   GLUCOSEU NEGATIVE 06/12/2018 2320   HGBUR NEGATIVE 06/12/2018 2320     BILIRUBINUR NEGATIVE 06/12/2018 2320   KETONESUR NEGATIVE 06/12/2018 2320   PROTEINUR NEGATIVE 06/12/2018 2320   UROBILINOGEN 0.2 05/07/2015 1345   NITRITE NEGATIVE 06/12/2018 2320   LEUKOCYTESUR NEGATIVE 06/12/2018 2320     Storie Heffern M.D. [facility] 06/25/2018, 4:32 PM  Pager: [phone] Between 7am to 7pm - call Pager - [phone]  After 7pm go to [email or web address] - password [email or web address]  Call night coverage person covering after 7pm

## 2018-06-25 NOTE — NC FL2 (Addendum)
[place] MEDICAID FL2 LEVEL OF CARE SCREENING TOOL     IDENTIFICATION  Patient Name: Jeffrey Nelson Birthdate: 06-28-1935 Sex: male Admission Date (Current Location): 06/12/2018  [facility] and [place] Number:  Producer, television/film/video and Address:  [facility] [place], [address] [place], [place] [address]      Provider Number: [id number]  Attending Physician Name and Address:  Cathren Harsh, MD  Relative Name and Phone Number:  Joshus Rogan [phone]    Current Level of Care: Hospital Recommended Level of Care: Skilled Nursing Facility Prior Approval Number:    Date Approved/Denied:   PASRR Number: [id number]  Discharge Plan: SNF    Current Diagnoses: Patient Active Problem List   Diagnosis Date Noted  . Discitis   . Coronary artery disease involving native coronary artery of native heart without angina pectoris   . Acute blood loss anemia   . Vertebral osteomyelitis, chronic (HCC) 06/19/2018    Class: Chronic  . Discitis of lumbar region 06/19/2018    Class: Chronic  . Loosening of hardware in spine (HCC) 06/19/2018    Class: Chronic  . Closed compression fracture of third lumbar vertebra (HCC) 06/18/2018  . Osteopenia of multiple sites 06/18/2018  . Osteomyelitis (HCC) 06/12/2018  . Essential hypertension 06/12/2018  . BPH (benign prostatic hyperplasia) 06/12/2018  . Lumbosacral radiculopathy at L3   . Tachycardia 12/14/2017  . Leukocytosis 12/14/2017  . Anemia due to blood loss 12/13/2017    Class: Chronic  . Syncope 12/03/2017  . Dehydration 12/03/2017  . Chronic back pain 12/03/2017  . Hypotension 12/03/2017  . Subclavian artery stenosis, right (HCC) 12/03/2017  . Status post lumbar laminectomy 11/12/2017  . Postoperative pain after spinal surgery 11/11/2017  . Weakness of right lower extremity   . Sciatica associated with disorder of lumbosacral spine   . Surgery, elective   . Diabetes mellitus type 2, uncomplicated  (HCC) 10/14/2017    Class: Chronic  . Lumbar disc herniation with radiculopathy 10/13/2017    Class: Acute  . Radiculopathy, lumbar region 10/09/2017  . Pain in right hip 10/09/2017    Class: Acute  . Right knee sprain 10/09/2017    Class: Acute  . Hip pain, acute, right 10/09/2017  . Bradycardia 10/09/2017  . Spondylolisthesis, lumbar region 10/05/2017  . Knee contusion 07/24/2016  . Spinal stenosis in cervical region 05/14/2015    Class: Chronic  . Cubital tunnel syndrome on left 05/14/2015    Class: Chronic  . Stenosis of cervical spine with myelopathy (HCC) 05/14/2015  . Spinal stenosis, lumbar region, with neurogenic claudication 01/16/2014    Class: Chronic    Orientation RESPIRATION BLADDER Height & Weight     Self, Time, Situation, Place  Normal Continent Weight: 214 lb 1.6 oz (97.1 kg) Height:     BEHAVIORAL SYMPTOMS/MOOD NEUROLOGICAL BOWEL NUTRITION STATUS      Continent Diet(Regular diet)  AMBULATORY STATUS COMMUNICATION OF NEEDS Skin   Limited Assist(Min assist) Verbally Other (Comment), Surgical wounds(Ecchymosis: right and left arms (assessed) ; Skin tear: right arm (thin film) ; Surgical wound: lower back (incision closed)(foam;gauze))  Puncture wound-lower back                       Personal Care Assistance Level of Assistance  Bathing, Feeding, Dressing Bathing Assistance: Limited assistance(Upper: Min guard  Lower: Mod assist) Feeding assistance: Independent Dressing Assistance: Limited assistance(Upper: Mod assist  Lower: Mod assist)     Functional Limitations Info  Sight, Hearing, Speech Sight Info: Impaired(Wears glasses) Hearing Info: Impaired(Wears earing aids) Speech Info: Adequate    SPECIAL CARE FACTORS FREQUENCY  PT (By licensed PT), OT (By licensed OT)     PT Frequency: PT at SNF evaluate and treat OT Frequency: OT at SNF evaluate and treat            Contractures Contractures Info: Not present    Additional Factors Info   Code Status, Allergies, Insulin Sliding Scale Code Status Info: Full code Allergies Info: Ezetimibe, Statins   Insulin Sliding Scale Info: Insulin aspart 0-9 units 3x daily with meals       Current Medications (06/25/2018):  This is the current hospital active medication list Current Facility-Administered Medications  Medication Dose Route Frequency Provider Last Rate Last Dose  . 0.9 %  sodium chloride infusion  250 mL Intravenous Continuous Kerrin Champagne, MD      . 0.9 %  sodium chloride infusion   Intravenous Continuous Kerrin Champagne, MD 100 mL/hr at 06/25/18 0217    . 0.9 %  sodium chloride infusion   Intravenous PRN Kerrin Champagne, MD 10 mL/hr at 06/22/18 1800    . acetaminophen (TYLENOL) tablet 650 mg  650 mg Oral Q4H PRN Kerrin Champagne, MD       Or  . acetaminophen (TYLENOL) suppository 650 mg  650 mg Rectal Q4H PRN Kerrin Champagne, MD      . aspirin EC tablet 81 mg  81 mg Oral Daily Kerrin Champagne, MD   81 mg at 06/25/18 0981  . bisacodyl (DULCOLAX) EC tablet 5 mg  5 mg Oral Daily PRN Kerrin Champagne, MD      . cefTRIAXone (ROCEPHIN) 2 g in sodium chloride 0.9 % 100 mL IVPB  2 g Intravenous Q24H Kerrin Champagne, MD 200 mL/hr at 06/24/18 1237 2 g at 06/24/18 1237  . cholecalciferol (VITAMIN D) tablet 5,000 Units  5,000 Units Oral Daily Kerrin Champagne, MD   5,000 Units at 06/25/18 [phone]  . cyclobenzaprine (FLEXERIL) tablet 5 mg  5 mg Oral TID PRN Kerrin Champagne, MD   5 mg at 06/21/18 1655  . docusate sodium (COLACE) capsule 100 mg  100 mg Oral BID Kerrin Champagne, MD   100 mg at 06/25/18 7829  . famotidine (PEPCID) tablet 20 mg  20 mg Oral BID Rai, Ripudeep K, MD   20 mg at 06/25/18 5621  . feeding supplement (ENSURE ENLIVE) (ENSURE ENLIVE) liquid 237 mL  237 mL Oral TID PC & HS Kerrin Champagne, MD   237 mL at 06/25/18 0824  . finasteride (PROSCAR) tablet 5 mg  5 mg Oral Daily Kerrin Champagne, MD   5 mg at 06/25/18 3086  . gabapentin (NEURONTIN) capsule 300 mg  300 mg Oral BID Kerrin Champagne, MD   300 mg at 06/25/18 5784  . HYDROcodone-acetaminophen (NORCO) 7.5-325 MG per tablet 1 tablet  1 tablet Oral Q6H Kerrin Champagne, MD   1 tablet at 06/25/18 0528  . insulin aspart (novoLOG) injection 0-9 Units  0-9 Units Subcutaneous TID WC Kerrin Champagne, MD   1 Units at 06/25/18 [phone]  . menthol-cetylpyridinium (CEPACOL) lozenge 3 mg  1 lozenge Oral PRN Kerrin Champagne, MD       Or  . phenol (CHLORASEPTIC) mouth spray 1 spray  1 spray Mouth/Throat PRN Kerrin Champagne, MD      . methocarbamol (ROBAXIN) tablet 500 mg  500 mg Oral Q6H PRN Kerrin Champagne, MD   500 mg at 06/24/18 0640   Or  . methocarbamol (ROBAXIN) 500 mg in dextrose 5 % 50 mL IVPB  500 mg Intravenous Q6H PRN Kerrin Champagne, MD      . metoprolol tartrate (LOPRESSOR) tablet 25 mg  25 mg Oral BID Kerrin Champagne, MD   25 mg at 06/25/18 1610  . morphine 2 MG/ML injection 1 mg  1 mg Intravenous Q2H PRN Kerrin Champagne, MD   1 mg at 06/23/18 9604  . ondansetron (ZOFRAN) tablet 4 mg  4 mg Oral Q6H PRN Kerrin Champagne, MD       Or  . ondansetron [facility]) injection 4 mg  4 mg Intravenous Q6H PRN Kerrin Champagne, MD      . oxyCODONE (Oxy IR/ROXICODONE) immediate release tablet 10 mg  10 mg Oral Q3H PRN Kerrin Champagne, MD   10 mg at 06/25/18 0927  . oxyCODONE (Oxy IR/ROXICODONE) immediate release tablet 5-10 mg  5-10 mg Oral Q4H PRN Kerrin Champagne, MD   10 mg at 06/23/18 2123  . pantoprazole (PROTONIX) EC tablet 40 mg  40 mg Oral Daily Kerrin Champagne, MD   40 mg at 06/25/18 5409  . polyethylene glycol (MIRALAX / GLYCOLAX) packet 17 g  17 g Oral BID Kerrin Champagne, MD   17 g at 06/25/18 8119  . polyethylene glycol (MIRALAX / GLYCOLAX) packet 17 g  17 g Oral Daily PRN Kerrin Champagne, MD   17 g at 06/22/18 0942  . senna (SENOKOT) tablet 8.6 mg  1 tablet Oral BID Kerrin Champagne, MD   8.6 mg at 06/25/18 1478  . simethicone (MYLICON) 40 MG/0.6ML suspension 40 mg  40 mg Oral QID PRN Kerrin Champagne, MD      . sodium chloride flush (NS) 0.9 %  injection 10-40 mL  10-40 mL Intracatheter PRN Kerrin Champagne, MD   10 mL at 06/19/18 0401  . sodium chloride flush (NS) 0.9 % injection 3 mL  3 mL Intravenous Q12H Kerrin Champagne, MD   3 mL at 06/25/18 0824  . sodium chloride flush (NS) 0.9 % injection 3 mL  3 mL Intravenous PRN Kerrin Champagne, MD      . sodium phosphate (FLEET) 7-19 GM/118ML enema 1 enema  1 enema Rectal Daily PRN Kerrin Champagne, MD      . sodium phosphate (FLEET) 7-19 GM/118ML enema 1 enema  1 enema Rectal Once PRN Kerrin Champagne, MD      . tamsulosin (FLOMAX) capsule 0.4 mg  0.4 mg Oral QPC supper Kerrin Champagne, MD   0.4 mg at 06/24/18 1708  . traZODone (DESYREL) tablet 50 mg  50 mg Oral QHS PRN Kerrin Champagne, MD   50 mg at 06/24/18 2225  . vancomycin (VANCOCIN) 1,250 mg in sodium chloride 0.9 % 250 mL IVPB  1,250 mg Intravenous Q12H Kerrin Champagne, MD 166.7 mL/hr at 06/25/18 0527 1,250 mg at 06/25/18 2956     Discharge Medications: Please see discharge summary for a list of discharge medications.  Relevant Imaging Results:  Relevant Lab Results:   Additional Information [id number]  Llana [facility] [employer] [phone]  *Patient has Aspen back brace

## 2018-06-25 NOTE — Clinical Social Work Note (Signed)
Clinical Social Work Assessment  Patient Details  Name: Jeffrey Nelson MRN: [id number] Date of Birth: 05/09/1935  Date of referral:  06/25/18               Reason for consult:  Discharge Planning, Facility Placement                Permission sought to share information with:  Family Supports Permission granted to share information::  Yes, Verbal Permission Granted  Name::     Danelle Berry  Agency::     Relationship::  Daughter  Contact Information:  [phone]  Housing/Transportation Living arrangements for the past 2 months:  Single Family Home Source of Information:  Adult Children(Daughter Kim at the bedside) Patient Interpreter Needed:  None Criminal Activity/Legal Involvement Pertinent to Current Situation/Hospitalization:  No - Comment as needed Significant Relationships:  Spouse, Adult Children Lives with:  Spouse Do you feel safe going back to the place where you live?  No(Patient and daughter in agreement with ST rehab in a skilled nursing facility ) Need for family participation in patient care:  Yes (Comment)  Care giving concerns:  Daughter and wife have concerns about patient discharging home from the hospital primarily due to his recent history of falls at home.  Social Worker assessment / plan: CSW talked with patient and daughter at the bedside and later with daughter and wife at CSW's work station on 5M regarding discharge planning and facility placement. The patient and family were very pleasant and engaged in the conversation regarding his discharge plan.  Preferences given were [facility] and [address] however [facility] has no rehab beds and [employer] does not accept [employer]. Mr. Epple, his wife and daughter all expressed concerns regarding the facilities that made bed offers and the quality of care offered.  Mrs. Yepez asked about cost to private pay and this was discussed. Mrs. Plouffe, patient and daughter will continue to discuss patient's  discharge plan and make a facility decision of take patient home with [facility] services.  Vernia Buff with Inpatient Rehab visit with patient and his daughter and also observed PT working with patient and advised them that patient does not need CIR due to his level of functioning.  CSW also advised wife and daughter of the possibility that [facility] not approving ST rehab due to his current level of functioning that is documented in PT/OT notes.    Employment status:  Retired Customer service manager) PT Recommendations:  Home with Home Health Information / Referral to community resources:  Skilled Nursing Facility(Daughter provided with SNF list)  Patient/Family's Response to care:  No concerns expressed by patient or family regarding his care during hospitalization.   Patient/Family's Understanding of and Emotional Response to Diagnosis, Current Treatment, and Prognosis:  Patient's family concerned about him going home and really want ST rehab for him due to his history of falls at home. They are hopeful that insurance will approve ST rehab.   Emotional Assessment Appearance:  Appears stated age Attitude/Demeanor/Rapport:  Other, Engaged(Appropriate) Affect (typically observed):  Appropriate, Pleasant Orientation:  Oriented to Self, Oriented to Place, Oriented to  Time, Oriented to Situation Alcohol / Substance use:  Tobacco Use, Alcohol Use, Illicit Drugs(Patient reported that he quit smoking and does not drink or use illicit drugs) Psych involvement (Current and /or in the community):  No (Comment)  Discharge Needs  Concerns to be addressed:  Discharge Planning Concerns Readmission within the last 30 days:  No Current discharge  risk:  Other(Lives with wife, however daughter concerned about patient being able to safely and adequately care for himself at home) Barriers to Discharge:  No SNF bed, Insurance Authorization(SNF search process  initiated.)   Cristobal Goldmann, LCSW 06/25/2018, 2:09 PM

## 2018-06-26 DIAGNOSIS — M48062 Spinal stenosis, lumbar region with neurogenic claudication: Secondary | ICD-10-CM

## 2018-06-26 DIAGNOSIS — N400 Enlarged prostate without lower urinary tract symptoms: Secondary | ICD-10-CM

## 2018-06-26 LAB — CBC
HCT: 29.4 % — ABNORMAL LOW (ref 39.0–52.0)
Hemoglobin: 9.5 g/dL — ABNORMAL LOW (ref 13.0–17.0)
MCH: 31 pg (ref 26.0–34.0)
MCHC: 32.3 g/dL (ref 30.0–36.0)
MCV: 96.1 fL (ref 80.0–100.0)
PLATELETS: 334 10*3/uL (ref 150–400)
RBC: 3.06 MIL/uL — ABNORMAL LOW (ref 4.22–5.81)
RDW: 13.7 % (ref 11.5–15.5)
WBC: 9.5 10*3/uL (ref 4.0–10.5)
nRBC: 0 % (ref 0.0–0.2)

## 2018-06-26 LAB — AEROBIC/ANAEROBIC CULTURE W GRAM STAIN (SURGICAL/DEEP WOUND)

## 2018-06-26 LAB — BASIC METABOLIC PANEL
Anion gap: 6 (ref 5–15)
BUN: 10 mg/dL (ref 8–23)
CALCIUM: 9.1 mg/dL (ref 8.9–10.3)
CO2: 31 mmol/L (ref 22–32)
CREATININE: 0.7 mg/dL (ref 0.61–1.24)
Chloride: 101 mmol/L (ref 98–111)
GFR calc Af Amer: >60 mL/min (ref >60)
GFR calc non Af Amer: >60 mL/min (ref >60)
GLUCOSE: 127 mg/dL — AB (ref 70–99)
Potassium: 4.1 mmol/L (ref 3.5–5.1)
Sodium: 138 mmol/L (ref 135–145)

## 2018-06-26 LAB — GLUCOSE, CAPILLARY
Glucose-Capillary: 125 mg/dL — ABNORMAL HIGH (ref 70–99)
Glucose-Capillary: 164 mg/dL — ABNORMAL HIGH (ref 70–99)
Glucose-Capillary: 123 mg/dL — ABNORMAL HIGH (ref 70–99)

## 2018-06-26 LAB — AEROBIC/ANAEROBIC CULTURE (SURGICAL/DEEP WOUND): CULTURE: NO GROWTH

## 2018-06-26 MED ORDER — CEFTRIAXONE IV (FOR PTA / DISCHARGE USE ONLY): 2.0000 g | INTRAVENOUS | 0 refills | Status: AC

## 2018-06-26 MED ORDER — TRAZODONE HCL 50 MG PO TABS: 50.0000 mg | ORAL_TABLET | Freq: Every evening | ORAL | 0 refills | Status: AC | PRN

## 2018-06-26 MED ORDER — ENSURE ENLIVE PO LIQD: 1.0000 | Freq: Three times a day (TID) | ORAL | 0 refills | Status: AC

## 2018-06-26 MED ORDER — HYDROCODONE-ACETAMINOPHEN 7.5-325 MG PO TABS: 1.0000 | ORAL_TABLET | Freq: Four times a day (QID) | ORAL | 0 refills | Status: AC

## 2018-06-26 MED ORDER — HEPARIN SOD (PORK) LOCK FLUSH 100 UNIT/ML IV SOLN
250.0000 [IU] | INTRAVENOUS | Status: AC | PRN
  Administered 2018-06-26: 250 [IU]

## 2018-06-26 MED ORDER — VANCOMYCIN IV (FOR PTA / DISCHARGE USE ONLY): 1250.0000 mg | Freq: Two times a day (BID) | INTRAVENOUS | 0 refills | Status: AC

## 2018-06-26 MED ORDER — POLYETHYLENE GLYCOL 3350 17 G PO PACK: 17.0000 g | PACK | Freq: Every day | ORAL | 0 refills | Status: AC | PRN

## 2018-06-26 NOTE — Progress Notes (Signed)
Physical Therapy Treatment Patient Details Name: Jeffrey Nelson MRN: [id number] DOB: [birth year] Today's Date: 06/26/2018    History of Present Illness Patient is a 83 y/o male who presents with L1-2, 2-3, 3-4 discitis and osteomyelitis now s/p removal of hardware, exploration of fusion L3-L5 and Placement of Vancomycin Stimulan Antibiotic Beads 10/25. PMh includes CAD, DM, HTN, MI, CVA.    PT Comments    Pt was able to perform transfers and ambulation with gross min guard assist to supervision for safety. Pt's wife was present and pt/wife were both educated on brace application/wearing schedule, car transfer, stair negotiation, safe activity progression, and back precautions. Pt and wife agreeable to return home, and wife requesting home health nurse to assist with managing PICC line. RN and CSW updated at end of session. Will continue to follow.    Follow Up Recommendations  Home health PT;Supervision for mobility/OOB     Equipment Recommendations  None recommended by PT    Recommendations for Other Services       Precautions / Restrictions Precautions Precautions: Back;Fall Precaution Booklet Issued: No Precaution Comments: pt able to recall 3/3 back precautions Required Braces or Orthoses: Spinal Brace Spinal Brace: Thoracolumbosacral orthotic;Applied in sitting position Restrictions Weight Bearing Restrictions: No    Mobility  Bed Mobility Overal bed mobility: Needs Assistance Bed Mobility: Rolling;Sidelying to Sit;Sit to Sidelying Rolling: Min guard Sidelying to sit: Min guard     Sit to sidelying: Min guard General bed mobility comments: OOB in recliner upon arrival  Transfers Overall transfer level: Needs assistance Equipment used: Rolling walker (2 wheeled) Transfers: Sit to/from Stand Sit to Stand: Supervision         General transfer comment: Supervision for safety; pt demonstrating good hand placement and able to rise without physical  assist.  Ambulation/Gait Ambulation/Gait assistance: Min guard Gait Distance (Feet): 400 Feet Assistive device: Rolling walker (2 wheeled) Gait Pattern/deviations: Step-through pattern;Decreased step length - right;Decreased step length - left Gait velocity: decreased  Gait velocity interpretation: 1.31 - 2.62 ft/sec, indicative of limited community ambulator General Gait Details: Pt required verbal cueing in order to maintain upright posture and forward gaze. Pt also instructed on maintaining center of mass within the limits of the walker.   Stairs Stairs: Yes Stairs assistance: Min guard Stair Management: One rail Left Number of Stairs: 4 General stair comments: Walked with step to pattern, leading with R LE during ascending. During descent, pt lead with L LE. Number of stairs limited by IV pole connection.    Wheelchair Mobility    Modified Rankin (Stroke Patients Only)       Balance Overall balance assessment: Needs assistance Sitting-balance support: Feet supported;No upper extremity supported Sitting balance-Leahy Scale: Good Sitting balance - Comments: Min assist to don/doff TLSO.   Standing balance support: During functional activity;Bilateral upper extremity supported Standing balance-Leahy Scale: Good Standing balance comment: Able to stand without UE support.                             Cognition Arousal/Alertness: Awake/alert Behavior During Therapy: WFL for tasks assessed/performed Overall Cognitive Status: Within Functional Limits for tasks assessed                                        Exercises      General Comments General comments (skin integrity, edema, etc.): Wife present  Pertinent Vitals/Pain Pain Assessment: Faces Faces Pain Scale: Hurts a little bit Pain Location: back at surgical site Pain Descriptors / Indicators: Operative site guarding;Discomfort;Dull Pain Intervention(s): Monitored during session     Home Living                      Prior Function            PT Goals (current goals can now be found in the care plan section) Acute Rehab PT Goals Patient Stated Goal: hopeful for home today PT Goal Formulation: With patient/family Time For Goal Achievement: 07/06/18 Potential to Achieve Goals: Good Progress towards PT goals: Progressing toward goals    Frequency    Min 5X/week      PT Plan Current plan remains appropriate    Co-evaluation              AM-PAC PT "6 Clicks" Daily Activity  Outcome Measure  Difficulty turning over in bed (including adjusting bedclothes, sheets and blankets)?: None Difficulty moving from lying on back to sitting on the side of the bed? : A Little Difficulty sitting down on and standing up from a chair with arms (e.g., wheelchair, bedside commode, etc,.)?: A Little Help needed moving to and from a bed to chair (including a wheelchair)?: A Little Help needed walking in hospital room?: A Little Help needed climbing 3-5 steps with a railing? : A Little 6 Click Score: 19    End of Session Equipment Utilized During Treatment: Gait belt;Back brace Activity Tolerance: Patient tolerated treatment well;No increased pain Patient left: in bed;with call bell/phone within reach;with family/visitor present Nurse Communication: Mobility status PT Visit Diagnosis: Unsteadiness on feet (R26.81);Difficulty in walking, not elsewhere classified (R26.2) Pain - part of body: (back)     Time: 1030-1110 PT Time Calculation (min) (ACUTE ONLY): 40 min  Charges:  $Gait Training: 38-52 mins                     Conni Slipper, PT, DPT Acute Rehabilitation Services Pager: [phone] Office: [phone]    Marylynn Pearson 06/26/2018, 2:36 PM

## 2018-06-26 NOTE — Progress Notes (Signed)
Occupational Therapy Treatment Patient Details Name: Jeffrey Nelson MRN: [id number] DOB: Apr 19, 1935 Today's Date: 06/26/2018    History of present illness Patient is a 83 y/o male who presents with L1-2, 2-3, 3-4 discitis and osteomyelitis now s/p removal of hardware, exploration of fusion L3-L5 and Placement of Vancomycin Stimulan Antibiotic Beads 10/25. PMh includes CAD, DM, HTN, MI, CVA.   OT comments  Pt presents sitting up in recliner with spouse present, pleasant and willing to participate in therapy session. Reviewed use of AE for LB ADL after return home while maintaining back precautions. Pt verbalizing and return demonstrating good understanding throughout. Reviewed general safety, AE and compensatory strategies for completing ADLs while maintaining back precautions, questions answered throughout. Pt anticipating d/c home later today. Will continue to follow while he remains in acute setting to progress pt towards established OT goals.   Follow Up Recommendations  Home health OT;Supervision/Assistance - 24 hour    Equipment Recommendations  3 in 1 bedside commode          Precautions / Restrictions Precautions Precautions: Back;Fall Precaution Booklet Issued: No Precaution Comments: pt able to recall 3/3 back precautions Required Braces or Orthoses: Spinal Brace Spinal Brace: Thoracolumbosacral orthotic;Applied in sitting position Restrictions Weight Bearing Restrictions: No       Mobility Bed Mobility               General bed mobility comments: OOB in recliner upon arrival  Transfers Overall transfer level: Needs assistance Equipment used: Rolling walker (2 wheeled) Transfers: Sit to/from Stand Sit to Stand: Min guard         General transfer comment: for safety; pt demosntrating good hand placement and able to rise without physical assist    Balance Overall balance assessment: Needs assistance Sitting-balance support: Feet supported;No upper  extremity supported Sitting balance-Leahy Scale: Good     Standing balance support: During functional activity;Bilateral upper extremity supported Standing balance-Leahy Scale: Good                             ADL either performed or assessed with clinical judgement   ADL Overall ADL's : Needs assistance/impaired                     Lower Body Dressing: Minimal assistance;With adaptive equipment;Sit to/from stand Lower Body Dressing Details (indicate cue type and reason): reviewed use of AE for LB dressing task including reacher and sock aide; pt return demonstrating understanding             Functional mobility during ADLs: Min guard;Rolling walker General ADL Comments: reviewed safety, AE and compensatory strategies for ADL completion     Vision       Perception     Praxis      Cognition Arousal/Alertness: Awake/alert Behavior During Therapy: WFL for tasks assessed/performed Overall Cognitive Status: Within Functional Limits for tasks assessed                                          Exercises     Shoulder Instructions       General Comments      Pertinent Vitals/ Pain       Pain Assessment: Faces Faces Pain Scale: Hurts little more Pain Location: back at surgical site Pain Descriptors / Indicators: Operative site guarding;Discomfort;Dull Pain Intervention(s): Limited activity within  patient's tolerance;Monitored during session  Home Living                                          Prior Functioning/Environment              Frequency  Min 2X/week        Progress Toward Goals  OT Goals(current goals can now be found in the care plan section)  Progress towards OT goals: Progressing toward goals  Acute Rehab OT Goals Patient Stated Goal: hopeful for home today OT Goal Formulation: With patient Time For Goal Achievement: 07/08/18 Potential to Achieve Goals: Good ADL Goals Pt Will  Perform Lower Body Bathing: with min guard assist;with caregiver independent in assisting;with adaptive equipment;sitting/lateral leans Pt Will Perform Lower Body Dressing: with min guard assist;with caregiver independent in assisting;with adaptive equipment;sit to/from stand Pt Will Transfer to Toilet: with modified independence;ambulating Pt Will Perform Toileting - Clothing Manipulation and hygiene: with min guard assist;sit to/from stand;with caregiver independent in assisting Additional ADL Goal #1: Pt will recall and maintain 3/3 back precautions at independent level during ADL activity  Plan Discharge plan remains appropriate    Co-evaluation                 AM-PAC PT "6 Clicks" Daily Activity     Outcome Measure   Help from another person eating meals?: None Help from another person taking care of personal grooming?: A Little Help from another person toileting, which includes using toliet, bedpan, or urinal?: A Little Help from another person bathing (including washing, rinsing, drying)?: A Little Help from another person to put on and taking off regular upper body clothing?: A Little Help from another person to put on and taking off regular lower body clothing?: A Lot 6 Click Score: 18    End of Session Equipment Utilized During Treatment: Back brace  OT Visit Diagnosis: Unsteadiness on feet (R26.81);Other symptoms and signs involving cognitive function;Pain Pain - part of body: (back)   Activity Tolerance Patient tolerated treatment well   Patient Left in chair;with call bell/phone within reach;with family/visitor present   Nurse Communication Mobility status;Precautions        Time: 1610-9604 OT Time Calculation (min): 17 min  Charges: OT General Charges $OT Visit: 1 Visit OT Treatments $Self Care/Home Management : 8-22 mins  Marcy Siren, OT Supplemental Rehabilitation Services Pager [phone] Office [phone]   Orlando Penner 06/26/2018, 1:08 PM

## 2018-06-26 NOTE — Clinical Social Work Note (Signed)
Patient will discharge home today with [facility] services. CSW signing off as no other SW intervention services needed.  Genelle Bal, MSW, LCSW Licensed Clinical Social Worker Clinical Social Work Department [employer] [phone]

## 2018-06-26 NOTE — Progress Notes (Addendum)
10:30 Spoke w patient's wife, she was tearful that she was not yet comfortable w IV med administration for home. Spoke w [facility] who states she will be in today to educate patient on PICC.  [facility] will follow for home IV Abx and home health care.  Patient has DME 3/1 RW at home.  12:00 Notified [facility] that patient will DC today.  CM continuing to follow.

## 2018-06-26 NOTE — Discharge Summary (Signed)
Physician Discharge Summary  Jeffrey Nelson PJA:[id number] DOB: 18-Oct-1934 DOA: 06/12/2018  PCP: Wayland Salinas, MD  Admit date: 06/12/2018 Discharge date: 06/26/2018  Time spent: 45 minutes  Recommendations for Outpatient Follow-up:  Patient will be discharged to home with home health physical, occupational therapy as well as nursing.  Patient will need to follow up with primary care provider within one week of discharge.  Follow-up with orthopedics, in 1 week.  Follow up with infectious disease, they will arrange follow up with you. Patient should continue medications as prescribed.  Patient should follow a heart healthy/carb modified diet.  Discharge Diagnoses:  L2-3, L3-4 discitis/osteomyelitis involving the hardware Acute on chronic back pain Coronary artery disease  Diabetes mellitus, type II Constipation Essential hypertension BPH  Discharge Condition: Stable  Diet recommendation: heart healthy/carb modified   Filed Weights   06/21/18 0552 06/21/18 2044 06/24/18 2108  Weight: 94.6 kg 100.9 kg 97.1 kg    History of present illness:  On 06/12/2018 by Dr. Riccardo Dubin Arrien Jeffrey Nelson is a 83 y.o. 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  For the last 2 weeks patient has been experiencing worsening lower back pain, sharp in nature, severe in intensity, 10 out of 10, worse with movement and lying supine, no improving factors, for last 2 days it has been associated with chills.  Patient was seen in the orthopedic clinic by Dr. Louanne Skye, October 2 for worsening back pain, patient received tapentadol, and instructions to avoid frequent bending, and heavy lifting.  A bone scan was ordered and spine x-rays.  He was diagnosed with hardware loosening and  compression fracture of L3.  Patient symptoms kept worsening and he was referred to the emergency department for an emergent spine MRI to rule out infection.  Hospital Course:  L2-3, L3-4 discitis/osteomyelitis involving the hardware -Orthopedic surgery and infectious disease consulted and appreciated -Underwent disc aspiration by interventional radiology on 06/14/2018, cultures negative thus far -Underwent removal of hardware L3-L5, expiration of fusion L3-L5, biopsy L3, placement of vancomycin antibiotic beads -Surgical cultures show no growth at this time -PT evaluated patient recommended CIR however CIR recommended home health -Infectious disease recommended and continuing IV antibiotics for 6 weeks with vancomycin and ceftriaxone through 07/23/2018 -PICC line ordered and placed -Home health also ordered  Acute on chronic back pain -Continue pain control with hydrocodone as needed as well as muscle relaxants -Also continue bowel regimen  Coronary artery disease  -status post CABG -Patient had cardiac cath in July 2018, last stent was placed in November 2017 -Plavix has been on hold since surgery of 1025 -Continue to hold Plavix until patient follows up with Dr. Louanne Skye, orthopedic surgery  Diabetes mellitus, type II -Hemoglobin A1c 6.8 -?Diet controlled, on no meds at home  Constipation -resolved with medications -suspect secondary to pain meds  Essential hypertension -Continue metoprolol  BPH -Continue finasteride, tamsulosin  Procedures: L2-L3 disc aspiration by IR on 10/18 Removal of hardware L3-L5, expiration of fusion L3-L5, biopsy L3, placement of vancomycin antibiotic beads  Consultations: Orthopedic surgery Infectious disease Interventional radiology  Discharge Exam: Vitals:   06/26/18 0420 06/26/18 0852  BP: (!) 153/85 (!) 166/87  Pulse: 92 92  Resp: 18 18  Temp: 97.9 F (36.6 C) 97.8 F (36.6 C)  SpO2: 96% 96%   Patient has no complaints  today.   States he feels that he is strong enough to go home and would like to do so.  Denies current chest pain, shortness of breath, abdominal pain, nausea or vomiting, diarrhea constipation, dizziness or headache.   General: Well developed, well nourished, NAD, appears stated age  61: NCAT, mucous membranes moist.  Neck: Supple  Cardiovascular: S1 S2 auscultated, RRR  Respiratory: Clear to auscultation bilaterally with equal chest rise  Abdomen: Soft, nontender, nondistended, + bowel sounds  Extremities: warm dry without cyanosis clubbing or edema  Neuro: AAOx3, nonfocal  Psych: Normal affect and demeanor, pleasant   Discharge Instructions Discharge Instructions    Discharge instructions   Complete by:  As directed    Patient will be discharged to home with home health physical, occupational therapy as well as nursing.  Patient will need to follow up with primary care provider within one week of discharge.  Follow-up with orthopedics, in 1 week.  Follow up with infectious disease, they will arrange follow up with you. Patient should continue medications as prescribed.  Patient should follow a heart healthy/carb modified diet.   Home infusion instructions [facility] May follow [place] Dosing Protocol; May administer Cathflo as needed to maintain patency of vascular access device.; Flushing of vascular access device: per [facility] Protocol: 0.9% NaCl pre/post medica...   Complete by:  As directed    Instructions:  May follow [place] Dosing Protocol   Instructions:  May administer Cathflo as needed to maintain patency of vascular access device.   Instructions:  Flushing of vascular access device: per [facility] Protocol: 0.9% NaCl pre/post medication administration and prn patency; Heparin 100 u/ml, 44m for implanted ports and Heparin 10u/ml, 540mfor all other central venous catheters.   Instructions:  May follow [facility] Anaphylaxis Protocol for First Dose Administration in the home: 0.9%  NaCl at 25-50 ml/hr to maintain IV access for protocol meds. Epinephrine 0.3 ml IV/IM PRN and Benadryl 25-50 IV/IM PRN s/s of anaphylaxis.   Instructions:  [facility] Coordinator (RN) to assist per patient IV care needs in the home PRN.     Allergies as of 06/26/2018      Reactions   Ezetimibe Other (See Comments)   Cramps   Statins Other (See Comments)   Cramps      Medication List    STOP taking these medications   clopidogrel 75 MG tablet Commonly known as:  PLAVIX   zolpidem 10 MG tablet Commonly known as:  AMBIEN     TAKE these medications   aspirin EC 81 MG tablet Take 81 mg by mouth daily.   baclofen 10 MG tablet Commonly known as:  LIORESAL Take 0.5-1 tablets (5-10 mg total) by mouth 3 (three) times daily as needed for muscle spasms.   cefTRIAXone  IVPB Commonly known as:  ROCEPHIN Inject 2 g into the vein daily. Indication:  Lumbar osteomyelitis Last Day of Therapy:  07/23/18 Labs - Once weekly:  CBC/D and BMP, Labs - Every other week:  ESR and CRP   Cranberry 400 MG Caps Take 400 mg by mouth daily.   feeding supplement (ENSURE ENLIVE) Liqd Take 237 mLs by mouth 4 (four) times daily - after meals and at bedtime.   finasteride 5 MG tablet Commonly known as:  PROSCAR Take 5 mg by mouth daily.   gabapentin 300 MG capsule Commonly known as:  NEURONTIN Take 1 capsule (300 mg total) by mouth 2 (two) times daily. What changed:  Another  medication with the same name was removed. Continue taking this medication, and follow the directions you see here.   HYDROcodone-acetaminophen 7.5-325 MG tablet Commonly known as:  NORCO Take 1 tablet by mouth every 6 (six) hours for 7 days.   linaclotide 145 MCG Caps capsule Commonly known as:  LINZESS Take 1 capsule (145 mcg total) by mouth daily before breakfast.   metoprolol tartrate 50 MG tablet Commonly known as:  LOPRESSOR Take 1 tablet (50 mg total) by mouth 2 (two) times daily. What changed:   how much to take   ondansetron 4 MG tablet Commonly known as:  ZOFRAN Take 1 tablet (4 mg total) by mouth every 8 (eight) hours as needed for nausea or vomiting.   polyethylene glycol packet Commonly known as:  MIRALAX / GLYCOLAX Take 17 g by mouth daily as needed for mild constipation.   REPATHA 140 MG/ML Sosy Generic drug:  Evolocumab Inject 140 mg into the skin every 14 (fourteen) days.   simethicone 40 MG/0.6ML drops Commonly known as:  MYLICON Take 0.6 mLs (40 mg total) by mouth 4 (four) times daily as needed for flatulence.   tamsulosin 0.4 MG Caps capsule Commonly known as:  FLOMAX Take 0.4 mg by mouth at bedtime.   Tapentadol HCl 100 MG Tabs Take one tablet every 6 hour prn pain   Teriparatide (Recombinant) 600 MCG/2.4ML Soln Inject 0.08 mLs (20 mcg total) into the skin daily.   traZODone 50 MG tablet Commonly known as:  DESYREL Take 1 tablet (50 mg total) by mouth at bedtime as needed for sleep.   TYLENOL 325 MG Caps Generic drug:  Acetaminophen Take 650 mg by mouth as needed (PAIN).   vancomycin  IVPB Inject 1,250 mg into the vein every 12 (twelve) hours. Indication:  Lumbar osteomyelitis Last Day of Therapy: 07/23/18 Labs - _0 /30/19 1157           Durable Medical Equipment  (From  admission, onward)         Start     Ordered   06/21/18 1443  DME Walker rolling  Once    Question:  Patient needs a walker to treat with the following condition  Answer:  Osteomyelitis of lumbar vertebra ([place])   06/21/18 1443   06/21/18 1443  DME 3 n 1  Once     06/21/18 1443         Allergies  Allergen Reactions  . Ezetimibe Other (See Comments)    Cramps   . Statins Other (See Comments)    Cramps    Follow-up Information    Jessy Oto, MD Follow up in 1 week(s).   Specialty:  Orthopedic Surgery Why:  For wound re-check Contact information: [place] [address] [place] Follow up.   Why:  IV antibitotics  Contact information: [place] [address] [place], [facility] Follow up.   Specialty:  Home Health Services Why:  Registered Nurse, Physical therapist, and Nurse AIde Contact information: [address] [place] [address] [phone]        Wayland Salinas, MD. Schedule an appointment as soon as possible for a visit in 1  week(s).   Specialty:  Family Medicine Why:  Hospital follow up Contact information: [place] [address] [phone]        Alfonso Patten, MD .   Specialty:  Cardiology Contact information: [address] [place] [address] [phone]        Thayer Headings, MD. Schedule an appointment as soon as possible for a visit.   Specialty:  Infectious Diseases Why:  Office will contact you for appointment closer to the end of your antibiotic therapy. If you do not hear from them, please call them.  Contact information: [address]  [address] [phone]            The results of significant diagnostics from this hospitalization (including imaging, microbiology, ancillary and laboratory) are listed below for reference.    Significant Diagnostic Studies: Dg Lumbar Spine 2-3 Views  Result Date: 06/25/2018 CLINICAL DATA:  Assess worsening collapse of L3 or L2 post removal of pedicle screws and rods. EXAM: LUMBAR SPINE - 2-3 VIEW COMPARISON:  06/12/2018 FINDINGS: Interval removal of posterior fusion hardware from L3-L5. Skin staples vertically projecting over the mid abdomen. Interbody fusion of L3 and L4 as well as L4 and L5. Persistent moderate L3 compression fracture without significant change. Mild loss of anterior vertebral body height of T12 unchanged. Moderate spondylosis throughout the lumbar spine to include facet arthropathy. Remainder of the exam is unchanged. IMPRESSION: No acute findings. Interval removal of posterior fusion hardware from L3 to L5. Stable moderate L3 compression fracture. Moderate spondylosis of the lumbar spine. Electronically Signed   By: Marin Olp M.D.   On: 06/25/2018 03:10   Dg Lumbar Spine 2-3 Views  Result Date: 06/21/2018 CLINICAL DATA:  ELECTIVE EXAM: DG C-ARM 61-120 MIN; LUMBAR SPINE - 2-3 VIEW COMPARISON:  No recent prior. FINDINGS: Postsurgical changes  noted of the lumbar spine. Levels of interbody fusion or noted. Number lumbar spine is difficult due to positioning. 0 minutes 29 seconds fluoroscopy time utilized. IMPRESSION: Postsurgical changes lumbar spine. Electronically Signed   By: Marcello Moores  Register  On: 06/21/2018 13:07   Mr Lumbar Spine Wo Contrast  Result Date: 06/17/2018 CLINICAL DATA:  Discitis osteomyelitis. History of lumbar spine surgery with hardware failure and compression fracture. EXAM: MRI LUMBAR SPINE WITHOUT CONTRAST TECHNIQUE: Multiplanar, multisequence MR imaging of the lumbar spine was performed. No intravenous contrast was administered. COMPARISON:  MRI of the lumbar spine June 12, 2018 and CT abdomen and pelvis June 12, 2018 FINDINGS: SEGMENTATION: For the purposes of this report, the last well-formed intervertebral disc is reported as L5-S1. ALIGNMENT: Maintained lumbar lordosis. No malalignment. VERTEBRAE:Status post L3 through S1 PLIF. Redemonstration of moderate L3 fracture with approximately 50% height loss, the pedicle screws are migrated to the superior cortex, unchanged. Pedicle screw of L4 may be extending into the disc space. L3-4 subsidence and abnormal signal within the disc space at L2-3, L3-4. Mild old T12 compression fracture. CONUS MEDULLARIS AND CAUDA EQUINA: Conus medullaris terminates at T12-L1 and demonstrates normal morphology and signal characteristics. Cauda equina is normal. PARASPINAL AND OTHER SOFT TISSUES: LEFT greater than RIGHT iliopsoas myositis without focal fluid collection. Paraspinal denervation at below the level surgical intervention. DISC LEVELS: T12-L1: No axial sequences. Stable small broad-based disc bulge without canal stenosis. Mild neural foraminal narrowing. L1-2: Small broad-based disc bulge asymmetric to the RIGHT. No canal stenosis. Mild RIGHT neural foraminal narrowing. L2-3: Disc bulge. No canal stenosis. Moderate to severe RIGHT greater LEFT neural foraminal narrowing. L3-4:  PLIF and posterior decompression without canal stenosis. Soft tissue effacing the neural foramen. L4-5: PLIF. Posterior decompression. No canal stenosis. Abnormal soft tissue LEFT neural foramen. L5-S1: Transitional anatomy, sacralized L5 vertebral body. No disc bulge, canal stenosis or neural foraminal narrowing. IMPRESSION: 1. Similar appearance of L2-3 and L3-4 discitis osteomyelitis. Associated iliopsoas myositis. 2. Status post L3 through L5 PLIF and posterior decompression with hardware failure. Moderate similar L2 pathologic fracture. 3. No canal stenosis. 4. Neural foraminal narrowing T12-L1 through L4-5; soft tissue effacing the neural foramen L2-3 through L4-5 could reflect disc material or purulent material. Electronically Signed   By: Elon Alas M.D.   On: 06/17/2018 21:39   Mr Lumbar Spine Wo Contrast  Result Date: 06/12/2018 CLINICAL DATA:  83 year old male with increased lumbar spine pain status post fusion 6 months ago. Fractures/collapse of the L3 vertebral body. EXAM: MRI LUMBAR SPINE WITHOUT CONTRAST TECHNIQUE: Multiplanar, multisequence MR imaging of the lumbar spine was performed. No intravenous contrast was administered. COMPARISON:  CT lumbar Spine [facility] 05/14/2018. Lumbar MRI 12/05/2017, and earlier. FINDINGS: Segmentation: Hypoplastic ribs assumed T12 as on the CT and April MRI numbering. Alignment: Straightening of lumbar lordosis is stable from last month. Vertebrae: Confluent marrow edema from the posterior inferior L2 body, throughout the L3 body, and into the superior and posterior L4 body. See series 7, image 9. This has progressed from the L3-L4 level endplate edema seen in April, with moderate collapse of L3 and erosion of the L3-L4 endplates since the prior MRI. Interbody implant at L3-L4. New abnormal increased T2 and STIR signal in the L2-L3 disc, with new L2 endplate and body marrow edema since April. The L1 and L5 levels appear stable and  intact. Mild chronic T12 inferior endplate compression is stable. Intact visible lower thoracic levels. Intact visible sacrum and SI joints. Conus medullaris and cauda equina: Conus extends to the T12-L1 level. No lower spinal cord or conus signal abnormality. Paraspinal and other soft tissues: Confluent left greater than right medial psoas muscle edema as seen on series 6, image 13  and series 5, images 17 (on the left) and 1 (on the right). No intramuscular fluid collection is identified. The STIR images demonstrate prominent marrow edema in the L3 transverse processes. Superimposed postoperative changes to the lower lumbar erector spinae muscles. Negative visible abdominal viscera. Disc levels: No Lower spinal stenosis above L2. L2-L3: New abnormal increased T2 and STIR signal within the disc. Progressed circumferential disc bulge with chronic mild to moderate facet and ligament flavum hypertrophy. New mild to moderate spinal stenosis. Chronic mild to moderate L2 foraminal stenosis greater on the right. L3-L4: Posterior and interbody fusion hardware here and prior decompression. No spinal stenosis. There appears to be soft tissue edema affecting the bilateral L3 neural foramina. L4-L5: Posterior and interbody fusion hardware plus decompression here. Mild residual right L4 foraminal stenosis related to facet hypertrophy. L5-S1: Stable and negative aside from mild facet hypertrophy. IMPRESSION: 1. Constellation of imaging findings since the 12/05/2017 MRI is most compatible with prolonged Discitis Osteomyelitis at L3-L4, and extension of Discitis Osteomyelitis to L2-L3 since April. There is bilateral L3 through L5 spinal fusion hardware in place. Associated partial collapse of L3 with L3 and L4 endplate erosion. Abnormal L2-L3 disc and L2 inferior endplates now. 2. Bilateral psoas muscle edema/inflammation. No intramuscular or epidural abscess identified. 3. Multifactorial mild to moderate spinal stenosis at L2-L3  is new since April. Electronically Signed   By: Genevie Ann M.D.   On: 06/12/2018 16:14   Nm Bone Scan 3 Phase  Result Date: 06/06/2018 CLINICAL DATA:  SI joint pain, question spondyloarthropathy, painful loosening of hardware L3, LEFT sacral pain, prior L3-L5 fusion in April 2019, assess for sacral insufficiency fracture EXAM: NUCLEAR MEDICINE 3-PHASE BONE SCAN TECHNIQUE: Radionuclide angiographic images, immediate static blood pool images, and 3-hour delayed static images were obtained of the lumbar spine, pelvis and hips after intravenous injection of radiopharmaceutical. RADIOPHARMACEUTICALS:  22 mCi Tc-5mMDP IV COMPARISON:  None Correlation: Lumbar spine radiographs 05/29/2018, pelvic radiograph 01/17/2018 FINDINGS: Vascular phase: Normal blood flow to the hips and pelvis Blood pool phase: Normal blood pool at the pelvis and hips. Mildly increased blood pool in the lumbar region at approximately the L3-L5 Delayed phase: Delayed images show normal tracer accumulation at the hip joints and SI joints. Normal tracer localization in the pelvis and proximal femora. No scintigraphic evidence of sacral insufficiency fracture. Diffusely increased tracer localization is seen in lumbar spine at L3-L5, predominantly at L3-L4, corresponding to the surgical levels identified on recent radiographs. These findings are consistent with the relative recent age of surgery and expected postoperative uptake. IMPRESSION: Normal scintigraphic appearance of the pelvis, SI joints, sacrum, and hip joints. Increased blood pool and delayed uptake of tracer in lower lumbar spine corresponding to prior L3-L5 fusion, with greatest uptake centered at L3-L4. Degree of uptake is consistent with preceding spine surgery. Due to postsurgical uptake, unable to exclude underlying infection. Electronically Signed   By: MLavonia DanaM.D.   On: 06/06/2018 13:44   Ct Abdomen Pelvis W Contrast  Result Date: 06/12/2018 CLINICAL DATA:  Left lower  quadrant pain with diarrhea. EXAM: CT ABDOMEN AND PELVIS WITH CONTRAST TECHNIQUE: Multidetector CT imaging of the abdomen and pelvis was performed using the standard protocol following bolus administration of intravenous contrast. CONTRAST:  100 cc Omnipaque 300 intravenous COMPARISON:  MRI of the lumbar spine from the same day FINDINGS: Lower chest:  Coronary calcification.  No acute finding Hepatobiliary: No focal liver abnormality.Single stone within the gallbladder which is full but shows no inflammatory  changes. Mild prominence of the biliary tree without calcified choledocholithiasis Pancreas: Unremarkable. Spleen: Unremarkable. 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 No hydronephrosis or stone. Subcentimeter low-densities in the left kidney, too small for densitometry. Unremarkable bladder. Stomach/Bowel:  No obstruction. No appendicitis. Vascular/Lymphatic: No acute vascular abnormality. Extensive atherosclerotic calcification of the aorta. No mass or adenopathy. Reproductive:Probable central prostate nodule.  No acute finding. Other: No ascites or pneumoperitoneum. Musculoskeletal: L2-3 and L3-4 discitis osteomyelitis. There has been L3-4 PLIF. The surrounding paravertebral fat is edematous. The pedicle screws at L3 and L4 show halo in/loosening. No evident collection. There is subsequent MRI available. IMPRESSION: 1. L2-3 and L3-4 discitis osteomyelitis which involves L3-4 PLIF hardware. 2. No evidence of colitis or other inflammation to explain diarrhea. 3. Cholelithiasis. Electronically Signed   By: Monte Fantasia M.D.   On: 06/12/2018 18:13   Dg C-arm 1-60 Min  Result Date: 06/21/2018 CLINICAL DATA:  ELECTIVE EXAM: DG C-ARM 61-120 MIN; LUMBAR SPINE - 2-3 VIEW COMPARISON:  No recent prior. FINDINGS: Postsurgical changes noted of the lumbar spine. Levels of interbody fusion or noted. Number lumbar spine is difficult due to positioning. 0 minutes 29 seconds fluoroscopy time  utilized. IMPRESSION: Postsurgical changes lumbar spine. Electronically Signed   By: Marcello Moores  Register   On: 06/21/2018 13:07   [place] Ekg Site Rite  Result Date: 06/14/2018 If Site Rite image not attached, placement could not be confirmed due to current cardiac rhythm.  Ir Lumbar Disc Aspiration W/img Guide  Result Date: 06/14/2018 INDICATION: L2-3 discitis EXAM: L2-3 DISC ASPIRATION MEDICATIONS: The patient is currently admitted to the hospital and receiving intravenous antibiotics. The antibiotics were administered within an appropriate time frame prior to the initiation of the procedure. ANESTHESIA/SEDATION: Fentanyl 100 mcg IV; Versed 2 mg IV Moderate Sedation Time:  10 minutes The patient was continuously monitored during the procedure by the interventional radiology nurse under my direct supervision. COMPLICATIONS: None immediate. PROCEDURE: Informed written consent was obtained from the patient after a thorough discussion of the procedural risks, benefits and alternatives. All questions were addressed. Maximal Sterile Barrier Technique was utilized including caps, mask, sterile gowns, sterile gloves, sterile drape, hand hygiene and skin antiseptic. A timeout was performed prior to the initiation of the procedure. The back was prepped and draped in a sterile fashion. 1% lidocaine was utilized for local anesthesia. Under fluoroscopic guidance, an 18 gauge needle was advanced into the L2-3 disc via right posterolateral approach. Two drops gray pus was aspirated. FINDINGS: Images document needle placement in the L2-3 disc. IMPRESSION: Successful L2-3 disc aspiration yielding 2 drops great pus. Electronically Signed   By: Marybelle Killings M.D.   On: 06/14/2018 13:30    Microbiology: Recent Results (from the past 240 hour(s))  Surgical pcr screen     Status: None   Collection Time: 06/20/18 11:47 PM  Result Value Ref Range Status   MRSA, PCR NEGATIVE NEGATIVE Final   Staphylococcus aureus NEGATIVE  NEGATIVE Final    Comment: (NOTE) The Xpert SA Assay (FDA approved for NASAL specimens in patients 66 years of age and older), is one component of a comprehensive surveillance program. It is not intended to diagnose infection nor to guide or monitor treatment. Performed at [facility], [place] [address], [place], [place] [address]   Aerobic/Anaerobic Culture (surgical/deep wound)     Status: None   Collection Time: 06/21/18 10:08 AM  Result Value Ref Range Status   Specimen Description BONE  Final   Special  Requests NONE  Final   Gram Stain   Final    RARE WBC PRESENT, PREDOMINANTLY PMN RARE GRAM NEGATIVE RODS    Culture   Final    No growth aerobically or anaerobically. Performed at [facility], [place] [address], [place], [place] [address]    Report Status 06/26/2018 FINAL  Final     Labs: Basic Metabolic Panel: Recent Labs  Lab 06/21/18 0345 06/22/18 0350 06/23/18 1130 06/25/18 0319 06/26/18 0412  NA 139 140 136 138 138  K 4.2 4.8 4.0 4.1 4.1  CL 100 104 101 102 101  CO2 30 32 _0 GLUCOSE 154* 157* 163* 145* 127*  BUN _1 CREATININE 0.86 0.79 0.91 0.75 0.70  CALCIUM 9.5 9.2 8.7* 8.8* 9.1   Liver Function Tests: No results for input(s): AST, ALT, ALKPHOS, BILITOT, PROT, ALBUMIN in the last 168 hours. No results for input(s): LIPASE, AMYLASE in the last 168 hours. No results for input(s): AMMONIA in the last 168 hours. CBC: Recent Labs  Lab 06/22/18 0350 06/23/18 1130 06/24/18 0413 06/25/18 0319 06/26/18 0412  WBC 11.6* 12.9* 11.6* 11.2* 9.5  HGB 10.3* 9.9* 9.6* 9.3* 9.5*  HCT 32.3* 31.2* 30.0* 30.3* 29.4*  MCV 96.4 97.5 95.8 97.4 96.1  PLT 357 347 335 347 334   Cardiac Enzymes: No results for input(s): CKTOTAL, CKMB, CKMBINDEX, TROPONINI in the last 168 hours. BNP: BNP (last 3 results) No results for input(s): BNP in the last 8760 hours.  ProBNP (last 3 results) No results for input(s): PROBNP in the last 8760  hours.  CBG: Recent Labs  Lab 06/25/18 1131 06/25/18 1613 06/25/18 2219 06/26/18 0751 06/26/18 1122  GLUCAP 142* 179* 125* 125* 164*       Signed:  Jerrie Gullo  [facility] 06/26/2018, 12:10 PM

## 2018-06-27 NOTE — Progress Notes (Signed)
Late Entry: Patient was discharged to home on 10/30. AVS was reviewed with patient and his wife. IV team came to cap and flush PICC. All questions answered.

## 2018-06-28 ENCOUNTER — Telehealth (INDEPENDENT_AMBULATORY_CARE_PROVIDER_SITE_OTHER): Payer: Self-pay | Admitting: Specialist

## 2018-06-28 NOTE — Telephone Encounter (Signed)
Patient's wife called requesting for you to call her.  CB#[phone].  Thank you.

## 2018-06-28 NOTE — Telephone Encounter (Signed)
Pt needed a copy of op note. I printed and put it at the front desk.

## 2018-07-01 ENCOUNTER — Telehealth (INDEPENDENT_AMBULATORY_CARE_PROVIDER_SITE_OTHER): Payer: Self-pay | Admitting: Radiology

## 2018-07-01 NOTE — Telephone Encounter (Signed)
Jeffrey Nelson was calling about an area at the top of the incision. She states that it is about the size of an egg.  She states that the patient has an appt this week and wanted to let us know.    I called and spoke with Jeffrey Nelson patients wife, and she states that she noticed it Saturday when she went to change his dressing on it.  Per Jeffrey Nelson, there is NO redness, drainage, and it is hard.  She states that since they have an appt on Thursday they would wait to see Dr. Otelia Sergeant about it then.  I advised to her that she needs to call me if there are any changes like redness, drainage, warm to touch, or if it becomes painful and would get him in with someone. She states that she understands this.

## 2018-07-03 DIAGNOSIS — Z9889 Other specified postprocedural states: Secondary | ICD-10-CM

## 2018-07-04 ENCOUNTER — Ambulatory Visit (INDEPENDENT_AMBULATORY_CARE_PROVIDER_SITE_OTHER): Payer: Medicare HMO | Admitting: Specialist

## 2018-07-04 ENCOUNTER — Ambulatory Visit (INDEPENDENT_AMBULATORY_CARE_PROVIDER_SITE_OTHER): Payer: Medicare HMO

## 2018-07-04 ENCOUNTER — Encounter (INDEPENDENT_AMBULATORY_CARE_PROVIDER_SITE_OTHER): Payer: Self-pay | Admitting: Specialist

## 2018-07-04 VITALS — BP 125/65 | HR 72 | Ht 74.0 in | Wt 214.0 lb

## 2018-07-04 DIAGNOSIS — M4626 Osteomyelitis of vertebra, lumbar region: Secondary | ICD-10-CM | POA: Diagnosis not present

## 2018-07-04 DIAGNOSIS — S32030K Wedge compression fracture of third lumbar vertebra, subsequent encounter for fracture with nonunion: Secondary | ICD-10-CM

## 2018-07-04 MED ORDER — ZOLPIDEM TARTRATE ER 12.5 MG PO TBCR: 12.5000 mg | EXTENDED_RELEASE_TABLET | Freq: Every evening | ORAL | 0 refills | Status: DC | PRN

## 2018-07-04 NOTE — Progress Notes (Signed)
Post-Op Visit Note   Patient: Jeffrey Nelson           Date of Birth: 03-24-1935           MRN: [id number] Visit Date: 07/04/2018 PCP: Deloris Ping, MD   Assessment & Plan: 2 weeks post removal of hardware L3 to L5. Lumbar osteomyelitis.  Chief Complaint:  Chief Complaint  Patient presents with  . Lower Back - Routine Post Op    06/21/18 L3- L5 removal of hardware and exploration of fusion with placement of ABX beads.   legs are nv normal Motor and sensory are normal  Visit Diagnoses: No diagnosis found.  Plan: Avoid frequent bending and stooping  No lifting greater than 10 lbs. May use ice or moist heat for pain. Weight loss is of benefit. Best medication for lumbar disc disease is arthritis medications like motrin, celebrex and naprosyn. Exercise is important to improve your indurance and does allow people to function better inspite of back pain.    Follow-Up Instructions: No follow-ups on file.   Orders:  No orders of the defined types were placed in this encounter.  No orders of the defined types were placed in this encounter.   Imaging: No results found.  PMFS History: Patient Active Problem List   Diagnosis Date Noted  . Vertebral osteomyelitis, chronic (HCC) 06/19/2018    Priority: High    Class: Chronic  . Discitis of lumbar region 06/19/2018    Priority: High    Class: Chronic  . Loosening of hardware in spine (HCC) 06/19/2018    Priority: High    Class: Chronic  . Anemia due to blood loss 12/13/2017    Priority: High    Class: Chronic  . Lumbar disc herniation with radiculopathy 10/13/2017    Priority: High    Class: Acute  . Radiculopathy, lumbar region 10/09/2017    Priority: High  . Pain in right hip 10/09/2017    Priority: High    Class: Acute  . Right knee sprain 10/09/2017    Priority: High    Class: Acute  . Spinal stenosis in cervical region 05/14/2015    Priority: High    Class: Chronic  . Cubital tunnel syndrome on  left 05/14/2015    Priority: High    Class: Chronic  . Spinal stenosis, lumbar region, with neurogenic claudication 01/16/2014    Priority: High    Class: Chronic  . Diabetes mellitus type 2, uncomplicated (HCC) 10/14/2017    Priority: Medium    Class: Chronic  . History of removal of retained hardware   . Discitis   . Coronary artery disease involving native coronary artery of native heart without angina pectoris   . Acute blood loss anemia   . Closed compression fracture of third lumbar vertebra (HCC) 06/18/2018  . Osteopenia of multiple sites 06/18/2018  . Osteomyelitis (HCC) 06/12/2018  . Essential hypertension 06/12/2018  . BPH (benign prostatic hyperplasia) 06/12/2018  . Lumbosacral radiculopathy at L3   . Tachycardia 12/14/2017  . Leukocytosis 12/14/2017  . Syncope 12/03/2017  . Dehydration 12/03/2017  . Chronic back pain 12/03/2017  . Hypotension 12/03/2017  . Subclavian artery stenosis, right (HCC) 12/03/2017  . Status post lumbar laminectomy 11/12/2017  . Postoperative pain after spinal surgery 11/11/2017  . Weakness of right lower extremity   . Sciatica associated with disorder of lumbosacral spine   . Surgery, elective   . Hip pain, acute, right 10/09/2017  . Bradycardia 10/09/2017  . Spondylolisthesis, lumbar  region 10/05/2017  . Knee contusion 07/24/2016  . Stenosis of cervical spine with myelopathy (HCC) 05/14/2015   Past Medical History:  Diagnosis Date  . Anginal pain (HCC)    occ  . Arthritis    "joints" (10/09/2017)  . Chronic lower back pain   . Complication of anesthesia    extremely claustrophobic; "have to be put down for MRI; don't sit in back seat of car;, etc."  . Coronary artery disease   . High cholesterol    "get shots twice/month" (10/09/2017)  . History of blood transfusion 2001   "related to CABG"  . History of kidney stones   . HOH (hard of hearing)    wearing hearing aids  . Hypertension   . Myocardial infarction (HCC)  2000s-06/2017 X 4  . Stroke [facility]) ~ 2015   "in my right eye; sight is coming back little by little" (10/09/2017)  . Type II diabetes mellitus (HCC)     Family History  Problem Relation Age of Onset  . CAD Brother   . Cancer Brother   . Alzheimer's disease Mother   . Healthy Daughter   . Healthy Daughter   . Diabetes Neg Hx     Past Surgical History:  Procedure Laterality Date  . ANTERIOR CERVICAL DECOMP/DISCECTOMY FUSION N/A 05/14/2015   Procedure: C3-4  ANTERIOR CERVICAL DISCECTOMY AND FUSION WITH PLATE AND SCREWS, LOCAL AND ALLOGRAFT BONE GRAFT;  Surgeon: Kerrin Champagne, MD;  Location: [facility] OR;  Service: Orthopedics;  Laterality: N/A;  . BACK SURGERY    . CARDIAC CATHETERIZATION     X 17 (10/09/2017)  . CATARACT EXTRACTION W/ INTRAOCULAR LENS  IMPLANT, BILATERAL Bilateral   . COLONOSCOPY    . CORONARY ANGIOPLASTY  06/2017  . CORONARY ANGIOPLASTY WITH STENT PLACEMENT     "6 stents total" (10/09/2017)  . CORONARY ARTERY BYPASS GRAFT  2001   CABG x4   . CYSTOSCOPY W/ STONE MANIPULATION    . IR LUMBAR DISC ASPIRATION W/IMG GUIDE  06/14/2018  . JOINT REPLACEMENT    . LUMBAR LAMINECTOMY Right 10/13/2017   Procedure: MICRODISCECTOMY LUMBAR LAMINECTOMY RIGHT L3-4;  Surgeon: Kerrin Champagne, MD;  Location: [facility] OR;  Service: Orthopedics;  Laterality: Right;  . LUMBAR LAMINECTOMY Left 11/12/2017   Procedure: MICRODISCECTOMY L3-4 FOR RECURRENT HNP POSSIBLE FLURO;  Surgeon: Kerrin Champagne, MD;  Location: [facility] OR;  Service: Orthopedics;  Laterality: Left;  . LUMBAR LAMINECTOMY/DECOMPRESSION MICRODISCECTOMY N/A 01/16/2014   Procedure: Left L3-4 and L4-5 foraminotomies;  Surgeon: Kerrin Champagne, MD;  Location: [facility] OR;  Service: Orthopedics;  Laterality: N/A;  . NASAL FRACTURE SURGERY    . NASAL SINUS SURGERY     "several times since 1959"  . RADIOLOGY WITH ANESTHESIA N/A 10/12/2017   Procedure: MRI WITH ANESTHESIA;  Surgeon: Radiologist, Medication, MD;  Location: [facility] OR;  Service: Radiology;  Laterality:  N/A;  . RADIOLOGY WITH ANESTHESIA N/A 12/05/2017   Procedure: MRI WITH ANESTHESIA;  Surgeon: Radiologist, Medication, MD;  Location: [facility] OR;  Service: Radiology;  Laterality: N/A;  . SHOULDER OPEN ROTATOR CUFF REPAIR Bilateral   . TOTAL KNEE ARTHROPLASTY Right 2012  . TYMPANOSTOMY TUBE PLACEMENT Bilateral   . ULNAR NERVE TRANSPOSITION Left 05/14/2015   Procedure: LEFT ULNAR NERVE DECOMPRESSION AT THE ELBOW;  Surgeon: Kerrin Champagne, MD;  Location: [facility] OR;  Service: Orthopedics;  Laterality: Left;   Social History   Occupational History  . Not on file  Tobacco Use  . Smoking status: Former Smoker  Packs/day: 1.00    Years: 2.00    Pack years: 2.00    Last attempt to quit: 01/09/1957    Years since quitting: 61.5  . Smokeless tobacco: Never Used  Substance and Sexual Activity  . Alcohol use: No  . Drug use: Never  . Sexual activity: Not Currently

## 2018-07-04 NOTE — Patient Instructions (Signed)
  Plan: Avoid frequent bending and stooping  No lifting greater than 10 lbs. May use ice or moist heat for pain. Weight loss is of benefit. Best medication for lumbar disc disease is arthritis medications like motrin, celebrex and naprosyn. Exercise is important to improve your indurance and does allow people to function better inspite of back pain. 

## 2018-07-10 ENCOUNTER — Telehealth (INDEPENDENT_AMBULATORY_CARE_PROVIDER_SITE_OTHER): Payer: Self-pay | Admitting: Specialist

## 2018-07-10 NOTE — Telephone Encounter (Signed)
Can you ask Dr. Otelia SergeantNitka about this?

## 2018-07-10 NOTE — Telephone Encounter (Signed)
Jeffrey [facility] called wanting to know if Dr. Otelia SergeantNitka wants the patient to resume his Plavix.  CB#[phone].  Thank you

## 2018-07-10 NOTE — Telephone Encounter (Signed)
Please advise 

## 2018-07-10 NOTE — Telephone Encounter (Signed)
Per Dr. Otelia SergeantNitka he can resume plavix, I called and advised the patient and patty about this

## 2018-07-11 ENCOUNTER — Other Ambulatory Visit (INDEPENDENT_AMBULATORY_CARE_PROVIDER_SITE_OTHER): Payer: Self-pay | Admitting: Specialist

## 2018-07-11 NOTE — Telephone Encounter (Signed)
Patient's wife Lupita Leash(Donna) called advised patient need Rx refilled (Hydrocodone) Lupita LeashDonna said patient is out of pain medication.  The number to contact Lupita LeashDonna is [phone]

## 2018-07-11 NOTE — Telephone Encounter (Signed)
Sent request to Dr. Nitka 

## 2018-07-12 ENCOUNTER — Other Ambulatory Visit (INDEPENDENT_AMBULATORY_CARE_PROVIDER_SITE_OTHER): Payer: Self-pay | Admitting: Radiology

## 2018-07-12 MED ORDER — HYDROCODONE-ACETAMINOPHEN 7.5-325 MG PO TABS: 1.0000 | ORAL_TABLET | Freq: Four times a day (QID) | ORAL | 0 refills | Status: DC | PRN

## 2018-07-12 NOTE — Telephone Encounter (Signed)
Sent request to Dr. Nitka 

## 2018-07-12 NOTE — Addendum Note (Signed)
Addended by: Penne LashSHUE WILLS, Neysa BonitoHRISTY N on: 07/12/2018 03:00 PM   Modules accepted: Orders

## 2018-07-12 NOTE — Telephone Encounter (Signed)
Misplaced Ambien rx

## 2018-07-12 NOTE — Telephone Encounter (Signed)
Per Lupita LeashDonna they have misplaced the Ambien rx that they got on 07/04/18.

## 2018-07-15 ENCOUNTER — Other Ambulatory Visit (INDEPENDENT_AMBULATORY_CARE_PROVIDER_SITE_OTHER): Payer: Self-pay | Admitting: Specialist

## 2018-07-15 NOTE — Telephone Encounter (Signed)
Ambien refill request.

## 2018-07-17 ENCOUNTER — Encounter (INDEPENDENT_AMBULATORY_CARE_PROVIDER_SITE_OTHER): Payer: Self-pay | Admitting: Specialist

## 2018-07-17 ENCOUNTER — Ambulatory Visit (INDEPENDENT_AMBULATORY_CARE_PROVIDER_SITE_OTHER): Payer: Medicare HMO

## 2018-07-17 ENCOUNTER — Ambulatory Visit (INDEPENDENT_AMBULATORY_CARE_PROVIDER_SITE_OTHER): Payer: Medicare HMO | Admitting: Specialist

## 2018-07-17 VITALS — BP 130/76 | HR 68 | Ht 74.0 in | Wt 214.0 lb

## 2018-07-17 DIAGNOSIS — M4620 Osteomyelitis of vertebra, site unspecified: Secondary | ICD-10-CM

## 2018-07-17 NOTE — Progress Notes (Signed)
Post-Op Visit Note   Patient: Jeffrey Nelson           Date of Birth: [birth year]           MRN: [id number] Visit Date: 07/17/2018 PCP: Deloris Ping, MD   Assessment & Plan:  Chief Complaint:  Chief Complaint  Patient presents with  . Lower Back - Routine Post Op   Visit Diagnoses: No diagnosis found. Incision is healed.  Motor is normal. Pain in the left groin likely secondary to L2-3 discitis. Plan: Avoid frequent bending and stooping  No lifting greater than 10 lbs. May use ice or moist heat for pain. Weight loss is of benefit. Exercise is important to improve your indurance and does allow people to function better inspite of back pain.    Follow-Up Instructions: No follow-ups on file.   Orders:  No orders of the defined types were placed in this encounter.  No orders of the defined types were placed in this encounter.   Imaging: No results found.  PMFS History: Patient Active Problem List   Diagnosis Date Noted  . Vertebral osteomyelitis, chronic (HCC) 06/19/2018    Priority: High    Class: Chronic  . Discitis of lumbar region 06/19/2018    Priority: High    Class: Chronic  . Loosening of hardware in spine (HCC) 06/19/2018    Priority: High    Class: Chronic  . Anemia due to blood loss 12/13/2017    Priority: High    Class: Chronic  . Lumbar disc herniation with radiculopathy 10/13/2017    Priority: High    Class: Acute  . Radiculopathy, lumbar region 10/09/2017    Priority: High  . Pain in right hip 10/09/2017    Priority: High    Class: Acute  . Right knee sprain 10/09/2017    Priority: High    Class: Acute  . Spinal stenosis in cervical region 05/14/2015    Priority: High    Class: Chronic  . Cubital tunnel syndrome on left 05/14/2015    Priority: High    Class: Chronic  . Spinal stenosis, lumbar region, with neurogenic claudication 01/16/2014    Priority: High    Class: Chronic  . Diabetes mellitus type 2, uncomplicated  (HCC) 10/14/2017    Priority: Medium    Class: Chronic  . History of removal of retained hardware   . Discitis   . Coronary artery disease involving native coronary artery of native heart without angina pectoris   . Acute blood loss anemia   . Closed compression fracture of third lumbar vertebra (HCC) 06/18/2018  . Osteopenia of multiple sites 06/18/2018  . Osteomyelitis (HCC) 06/12/2018  . Essential hypertension 06/12/2018  . BPH (benign prostatic hyperplasia) 06/12/2018  . Lumbosacral radiculopathy at L3   . Tachycardia 12/14/2017  . Leukocytosis 12/14/2017  . Syncope 12/03/2017  . Dehydration 12/03/2017  . Chronic back pain 12/03/2017  . Hypotension 12/03/2017  . Subclavian artery stenosis, right (HCC) 12/03/2017  . Status post lumbar laminectomy 11/12/2017  . Postoperative pain after spinal surgery 11/11/2017  . Weakness of right lower extremity   . Sciatica associated with disorder of lumbosacral spine   . Surgery, elective   . Hip pain, acute, right 10/09/2017  . Bradycardia 10/09/2017  . Spondylolisthesis, lumbar region 10/05/2017  . Knee contusion 07/24/2016  . Stenosis of cervical spine with myelopathy (HCC) 05/14/2015   Past Medical History:  Diagnosis Date  . Anginal pain (HCC)    occ  . Arthritis    "  joints" (10/09/2017)  . Chronic lower back pain   . Complication of anesthesia    extremely claustrophobic; "have to be put down for MRI; don't sit in back seat of car;, etc."  . Coronary artery disease   . High cholesterol    "get shots twice/month" (10/09/2017)  . History of blood transfusion 2001   "related to CABG"  . History of kidney stones   . HOH (hard of hearing)    wearing hearing aids  . Hypertension   . Myocardial infarction (HCC) 2000s-06/2017 X 4  . Stroke [facility]) ~ 2015   "in my right eye; sight is coming back little by little" (10/09/2017)  . Type II diabetes mellitus (HCC)     Family History  Problem Relation Age of Onset  . CAD Brother   .  Cancer Brother   . Alzheimer's disease Mother   . Healthy Daughter   . Healthy Daughter   . Diabetes Neg Hx     Past Surgical History:  Procedure Laterality Date  . ANTERIOR CERVICAL DECOMP/DISCECTOMY FUSION N/A 05/14/2015   Procedure: C3-4  ANTERIOR CERVICAL DISCECTOMY AND FUSION WITH PLATE AND SCREWS, LOCAL AND ALLOGRAFT BONE GRAFT;  Surgeon: Kerrin Champagne, MD;  Location: [facility] OR;  Service: Orthopedics;  Laterality: N/A;  . BACK SURGERY    . CARDIAC CATHETERIZATION     X 17 (10/09/2017)  . CATARACT EXTRACTION W/ INTRAOCULAR LENS  IMPLANT, BILATERAL Bilateral   . COLONOSCOPY    . CORONARY ANGIOPLASTY  06/2017  . CORONARY ANGIOPLASTY WITH STENT PLACEMENT     "6 stents total" (10/09/2017)  . CORONARY ARTERY BYPASS GRAFT  2001   CABG x4   . CYSTOSCOPY W/ STONE MANIPULATION    . IR LUMBAR DISC ASPIRATION W/IMG GUIDE  06/14/2018  . JOINT REPLACEMENT    . LUMBAR LAMINECTOMY Right 10/13/2017   Procedure: MICRODISCECTOMY LUMBAR LAMINECTOMY RIGHT L3-4;  Surgeon: Kerrin Champagne, MD;  Location[facility] OR;  Service: Orthopedics;  Laterality: Right;  . LUMBAR LAMINECTOMY Left 11/12/2017   Procedure: MICRODISCECTOMY L3-4 FOR RECURRENT HNP POSSIBLE FLURO;  Surgeon: Kerrin Champagne, MD;  Location: [facility] OR;  Service: Orthopedics;  Laterality: Left;  . LUMBAR LAMINECTOMY/DECOMPRESSION MICRODISCECTOMY N/A 01/16/2014   Procedure: Left L3-4 and L4-5 foraminotomies;  Surgeon: Kerrin Champagne, MD;  Location: [facility] OR;  Service: Orthopedics;  Laterality: N/A;  . NASAL FRACTURE SURGERY    . NASAL SINUS SURGERY     "several times since 1959"  . RADIOLOGY WITH ANESTHESIA N/A 10/12/2017   Procedure: MRI WITH ANESTHESIA;  Surgeon: Radiologist, Medication, MD;  Location: [facility] OR;  Service: Radiology;  Laterality: N/A;  . RADIOLOGY WITH ANESTHESIA N/A 12/05/2017   Procedure: MRI WITH ANESTHESIA;  Surgeon: Radiologist, Medication, MD;  Location: [facility] OR;  Service: Radiology;  Laterality: N/A;  . SHOULDER OPEN ROTATOR CUFF REPAIR  Bilateral   . TOTAL KNEE ARTHROPLASTY Right 2012  . TYMPANOSTOMY TUBE PLACEMENT Bilateral   . ULNAR NERVE TRANSPOSITION Left 05/14/2015   Procedure: LEFT ULNAR NERVE DECOMPRESSION AT THE ELBOW;  Surgeon: Kerrin Champagne, MD;  Location: [facility] OR;  Service: Orthopedics;  Laterality: Left;   Social History   Occupational History  . Not on file  Tobacco Use  . Smoking status: Former Smoker    Packs/day: 1.00    Years: 2.00    Pack years: 2.00    Last attempt to quit: 01/09/1957    Years since quitting: 61.5  . Smokeless tobacco: Never Used  Substance and Sexual  Activity  . Alcohol use: No  . Drug use: Never  . Sexual activity: Not Currently

## 2018-07-17 NOTE — Progress Notes (Signed)
[id number]

## 2018-07-17 NOTE — Patient Instructions (Signed)
  Plan: Avoid frequent bending and stooping  No lifting greater than 10 lbs. May use ice or moist heat for pain. Weight loss is of benefit.  Exercise is important to improve your indurance and does allow people to function better inspite of back pain. 

## 2018-07-22 ENCOUNTER — Ambulatory Visit (INDEPENDENT_AMBULATORY_CARE_PROVIDER_SITE_OTHER): Payer: Medicare HMO | Admitting: Internal Medicine

## 2018-07-22 ENCOUNTER — Telehealth: Payer: Self-pay | Admitting: Behavioral Health

## 2018-07-22 ENCOUNTER — Encounter: Payer: Self-pay | Admitting: Internal Medicine

## 2018-07-22 DIAGNOSIS — Z5181 Encounter for therapeutic drug level monitoring: Secondary | ICD-10-CM | POA: Diagnosis not present

## 2018-07-22 DIAGNOSIS — Z452 Encounter for adjustment and management of vascular access device: Secondary | ICD-10-CM

## 2018-07-22 DIAGNOSIS — Z9889 Other specified postprocedural states: Secondary | ICD-10-CM

## 2018-07-22 DIAGNOSIS — M4646 Discitis, unspecified, lumbar region: Secondary | ICD-10-CM | POA: Diagnosis not present

## 2018-07-22 NOTE — Assessment & Plan Note (Signed)
His hardware was removed and no indication for further antibiotics.

## 2018-07-22 NOTE — Telephone Encounter (Signed)
Called [facility], Spoke with Debbie.  Gave Debbie verbal order per Dr. Luciana Axeomer to Pull PICC after last dose of IV antibiotics tomorrow Tuesday 07/23/2018.  Debbie verified order with read-back.  Patient made aware while in the office. Angeline SlimAshley Arye Weyenberg RN

## 2018-07-22 NOTE — Assessment & Plan Note (Signed)
His creatinine has what remained normal.  No issues from the antibiotics.

## 2018-07-22 NOTE — Assessment & Plan Note (Signed)
He has nearly completed 6 weeks of antibiotics and no concerns on labs or on his physical exam.  No growth did come from his cultures.  I will have him complete his antibiotics tomorrow and have the antibiotics stopped after then and he can be observed off of antibiotics.  He continues to follow Dr. Otelia SergeantNitka so we will defer further follow-ups to him and he can come back here as needed.

## 2018-07-22 NOTE — Assessment & Plan Note (Signed)
This has been working well and will be removed by home health after his last dose this week.

## 2018-07-22 NOTE — Progress Notes (Signed)
   Subjective:    Patient ID: Jeffrey Nelson, male    DOB: 07/14/1935, 83 y.o.   MRN: [id number]  HPI He is here for follow-up of his recent hospitalization. He has a history of coronary artery disease and osteoporosis with compression fracture in the lumbar area who underwent lumbar fusion in April 2019 by Dr. Otelia SergeantNitka.  He had continued pain and in September of this year with the hospital for evaluation for the pain.  Prior to presentation and during this hospitalization his pain continued to worsen and was associated with subjective fever, night sweats and temperature changes.  He did see Dr. Otelia SergeantNitka in his office and a bone scan noted some loosening of his hardware and further compression fracture and he was sent into the emergency room where imaging noted discitis in the lumbar L2-4.  He was started on empiric antibiotics and cultures remain negative.  He was to stay on antibiotics for projected 6 weeks which is to and tomorrow, 1126.  His most recent CRP is within normal limits.  His sed rate was not able to be done most recently.  He feels well though he has continued pain but overall has progressively improved, albeit moderately.  He has had no associated fever or rash or diarrhea.   Review of Systems  Constitutional: Negative for fatigue and unexpected weight change.  Gastrointestinal: Negative for diarrhea and nausea.  Skin: Negative for rash.       Objective:   Physical Exam  Constitutional: He appears well-developed and well-nourished. No distress.  Eyes: No scleral icterus.  Cardiovascular: Normal rate, regular rhythm and normal heart sounds.  No murmur heard. Pulmonary/Chest: Effort normal and breath sounds normal. No respiratory distress.  Musculoskeletal:  Back incision is closed, no surrounding erythema, no warmth, no tenderness  Skin: No rash noted.   SH: remote tobacco history       Assessment & Plan:

## 2018-07-29 ENCOUNTER — Telehealth: Payer: Self-pay | Admitting: Pharmacy Technician

## 2018-07-29 NOTE — Telephone Encounter (Signed)
Spoke to wife about renewing patient assistance for [employer] for 2020. They will come by this week to complete application.  11:46 AM Jeffrey Nettlesachael N Aminta Nelson, CPhT

## 2018-07-31 ENCOUNTER — Ambulatory Visit (INDEPENDENT_AMBULATORY_CARE_PROVIDER_SITE_OTHER): Payer: Medicare HMO | Admitting: Specialist

## 2018-07-31 ENCOUNTER — Ambulatory Visit (INDEPENDENT_AMBULATORY_CARE_PROVIDER_SITE_OTHER): Payer: Self-pay

## 2018-07-31 ENCOUNTER — Encounter (INDEPENDENT_AMBULATORY_CARE_PROVIDER_SITE_OTHER): Payer: Self-pay | Admitting: Specialist

## 2018-07-31 VITALS — BP 131/65 | HR 60 | Ht 74.0 in | Wt 214.0 lb

## 2018-07-31 DIAGNOSIS — K529 Noninfective gastroenteritis and colitis, unspecified: Secondary | ICD-10-CM

## 2018-07-31 DIAGNOSIS — M8668 Other chronic osteomyelitis, other site: Secondary | ICD-10-CM

## 2018-07-31 MED ORDER — OXYCODONE-ACETAMINOPHEN 5-325 MG PO TABS: 1.0000 | ORAL_TABLET | Freq: Three times a day (TID) | ORAL | 0 refills | Status: DC | PRN

## 2018-07-31 NOTE — Patient Instructions (Addendum)
Plan: Avoid frequent bending and stooping  No lifting greater than 10 lbs. May use ice or moist heat for pain. Weight loss is of benefit. Best medication for lumbar disc disease is arthritis medications like motrin, celebrex and naprosyn. Exercise is important to improve your indurance and does allow people to function better inspite of back pain. Referral to GI ( Gastroenterology) for assess of GI tract for Black stools and left lower quadrant pain post IV antibiotics.

## 2018-07-31 NOTE — Progress Notes (Signed)
Post-Op Visit Note   Patient: Jeffrey Nelson           Date of Birth: 1934/09/01           MRN: [id number] Visit Date: 07/31/2018 PCP: Deloris Pingyter-Brown, Sherry M, MD   Assessment & Plan:5 weeks post I&D of lumbar spine for osteomyelitis and loosening of hardware post fusion 11/2017  Chief Complaint:  Chief Complaint  Patient presents with  . Lower Back - Follow-up   Visit Diagnoses:  1. Chronic osteomyelitis of lumbar spine (HCC)   No inguinal hernia to cough and palpation Motor is normal  SLR is negative.  Plan: Avoid frequent bending and stooping  No lifting greater than 10 lbs. May use ice or moist heat for pain. Weight loss is of benefit. Best medication for lumbar disc disease is arthritis medications like motrin, celebrex and naprosyn. Exercise is important to improve your indurance and does allow people to function better inspite of back pain. Referral to GI ( Gastroenterology) for assess of GI tract for Black stools and left lower quadrant pain post IV antibiotics.    Follow-Up Instructions: No follow-ups on file.   Orders:  Orders Placed This Encounter  Procedures  . XR Lumbar Spine 2-3 Views   No orders of the defined types were placed in this encounter.   Imaging: No results found.  PMFS History: Patient Active Problem List   Diagnosis Date Noted  . Discitis of lumbar region 06/19/2018    Priority: High    Class: Chronic  . Loosening of hardware in spine (HCC) 06/19/2018    Priority: High    Class: Chronic  . Anemia due to blood loss 12/13/2017    Priority: High    Class: Chronic  . Lumbar disc herniation with radiculopathy 10/13/2017    Priority: High    Class: Acute  . Right knee sprain 10/09/2017    Priority: High    Class: Acute  . Spinal stenosis in cervical region 05/14/2015    Priority: High    Class: Chronic  . Cubital tunnel syndrome on left 05/14/2015    Priority: High    Class: Chronic  . Spinal stenosis, lumbar region, with  neurogenic claudication 01/16/2014    Priority: High    Class: Chronic  . Diabetes mellitus type 2, uncomplicated (HCC) 10/14/2017    Priority: Medium    Class: Chronic  . PICC (peripherally inserted central catheter) in place 07/22/2018  . Medication monitoring encounter 07/22/2018  . History of removal of retained hardware   . Coronary artery disease involving native coronary artery of native heart without angina pectoris   . Acute blood loss anemia   . Closed compression fracture of third lumbar vertebra (HCC) 06/18/2018  . Osteopenia of multiple sites 06/18/2018  . Essential hypertension 06/12/2018  . BPH (benign prostatic hyperplasia) 06/12/2018  . Lumbosacral radiculopathy at L3   . Chronic back pain 12/03/2017  . Subclavian artery stenosis, right (HCC) 12/03/2017  . Status post lumbar laminectomy 11/12/2017  . Postoperative pain after spinal surgery 11/11/2017  . Weakness of right lower extremity   . Sciatica associated with disorder of lumbosacral spine   . Surgery, elective   . Hip pain, acute, right 10/09/2017  . Bradycardia 10/09/2017  . Spondylolisthesis, lumbar region 10/05/2017  . Knee contusion 07/24/2016  . Stenosis of cervical spine with myelopathy (HCC) 05/14/2015   Past Medical History:  Diagnosis Date  . Anginal pain (HCC)    occ  . Arthritis    "  joints" (10/09/2017)  . Chronic lower back pain   . Complication of anesthesia    extremely claustrophobic; "have to be put down for MRI; don't sit in back seat of car;, etc."  . Coronary artery disease   . High cholesterol    "get shots twice/month" (10/09/2017)  . History of blood transfusion 2001   "related to CABG"  . History of kidney stones   . HOH (hard of hearing)    wearing hearing aids  . Hypertension   . Myocardial infarction (HCC) 2000s-06/2017 X 4  . Stroke [facility]) ~ 2015   "in my right eye; sight is coming back little by little" (10/09/2017)  . Type II diabetes mellitus (HCC)     Family History   Problem Relation Age of Onset  . CAD Brother   . Cancer Brother   . Alzheimer's disease Mother   . Healthy Daughter   . Healthy Daughter   . Diabetes Neg Hx     Past Surgical History:  Procedure Laterality Date  . ANTERIOR CERVICAL DECOMP/DISCECTOMY FUSION N/A 05/14/2015   Procedure: C3-4  ANTERIOR CERVICAL DISCECTOMY AND FUSION WITH PLATE AND SCREWS, LOCAL AND ALLOGRAFT BONE GRAFT;  Surgeon: Kerrin Champagne, MD;  Location: [facility] OR;  Service: Orthopedics;  Laterality: N/A;  . BACK SURGERY    . CARDIAC CATHETERIZATION     X 17 (10/09/2017)  . CATARACT EXTRACTION W/ INTRAOCULAR LENS  IMPLANT, BILATERAL Bilateral   . COLONOSCOPY    . CORONARY ANGIOPLASTY  06/2017  . CORONARY ANGIOPLASTY WITH STENT PLACEMENT     "6 stents total" (10/09/2017)  . CORONARY ARTERY BYPASS GRAFT  2001   CABG x4   . CYSTOSCOPY W/ STONE MANIPULATION    . IR LUMBAR DISC ASPIRATION W/IMG GUIDE  06/14/2018  . JOINT REPLACEMENT    . LUMBAR LAMINECTOMY Right 10/13/2017   Procedure: MICRODISCECTOMY LUMBAR LAMINECTOMY RIGHT L3-4;  Surgeon: Kerrin Champagne, MD;  Location: [facility] OR;  Service: Orthopedics;  Laterality: Right;  . LUMBAR LAMINECTOMY Left 11/12/2017   Procedure: MICRODISCECTOMY L3-4 FOR RECURRENT HNP POSSIBLE FLURO;  Surgeon: Kerrin Champagne, MD;  Location: [facility] OR;  Service: Orthopedics;  Laterality: Left;  . LUMBAR LAMINECTOMY/DECOMPRESSION MICRODISCECTOMY N/A 01/16/2014   Procedure: Left L3-4 and L4-5 foraminotomies;  Surgeon: Kerrin Champagne, MD;  Location: [facility] OR;  Service: Orthopedics;  Laterality: N/A;  . NASAL FRACTURE SURGERY    . NASAL SINUS SURGERY     "several times since 1959"  . RADIOLOGY WITH ANESTHESIA N/A 10/12/2017   Procedure: MRI WITH ANESTHESIA;  Surgeon: Radiologist, Medication, MD;  Location: [facility] OR;  Service: Radiology;  Laterality: N/A;  . RADIOLOGY WITH ANESTHESIA N/A 12/05/2017   Procedure: MRI WITH ANESTHESIA;  Surgeon: Radiologist, Medication, MD;  Location: [facility] OR;  Service: Radiology;   Laterality: N/A;  . SHOULDER OPEN ROTATOR CUFF REPAIR Bilateral   . TOTAL KNEE ARTHROPLASTY Right 2012  . TYMPANOSTOMY TUBE PLACEMENT Bilateral   . ULNAR NERVE TRANSPOSITION Left 05/14/2015   Procedure: LEFT ULNAR NERVE DECOMPRESSION AT THE ELBOW;  Surgeon: Kerrin Champagne, MD;  Location: [facility] OR;  Service: Orthopedics;  Laterality: Left;   Social History   Occupational History  . Not on file  Tobacco Use  . Smoking status: Former Smoker    Packs/day: 1.00    Years: 2.00    Pack years: 2.00    Last attempt to quit: 01/09/1957    Years since quitting: 61.5  . Smokeless tobacco: Never Used  Substance and Sexual  Activity  . Alcohol use: No  . Drug use: Never  . Sexual activity: Not Currently

## 2018-07-31 NOTE — Telephone Encounter (Signed)
Patient and wife stopped by office to sign renewal application. Patient had been in the hospital for an extended period of time and dealing with some medical issues, and has not scheduled shipment of Forteo.   [employer], the have been trying to contact patient with no success. Wife says they do not answer numbers they don't recognize. Gave them the number to [employer] and [facility]. [facility] has a scheduled call for patient for tomorrow, but patient can go ahead and call today.  [facility] Phone- [phone]  10:55 AM Dorthula Nettlesachael N Nery Kalisz, CPhT

## 2018-08-02 NOTE — Telephone Encounter (Signed)
Left message for patient to see if they were able to schedule Forteo shipment.

## 2018-08-05 ENCOUNTER — Telehealth (INDEPENDENT_AMBULATORY_CARE_PROVIDER_SITE_OTHER): Payer: Self-pay | Admitting: Specialist

## 2018-08-05 NOTE — Telephone Encounter (Signed)
Received voicemail message patient's wife Lupita Leash(Donna) asking for a call back concerning his referral and medicine that came in the mail Friday. The number to contact Lupita LeashDonna is [phone] or [phone]

## 2018-08-05 NOTE — Telephone Encounter (Signed)
I called and advised that they need to his records tranferred to Dr. Donavan BurnetBuccini's office so that he can review the records

## 2018-08-07 ENCOUNTER — Other Ambulatory Visit: Payer: Self-pay | Admitting: Gastroenterology

## 2018-08-07 ENCOUNTER — Ambulatory Visit
Admission: RE | Admit: 2018-08-07 | Discharge: 2018-08-07 | Disposition: A | Discharge status: Home / Self Care | Payer: Medicare HMO | Source: Ambulatory Visit | Attending: Gastroenterology | Admitting: Gastroenterology

## 2018-08-07 DIAGNOSIS — R1032 Left lower quadrant pain: Secondary | ICD-10-CM

## 2018-08-07 MED ORDER — IOPAMIDOL (ISOVUE-300) INJECTION 61%
100.0000 mL | Freq: Once | INTRAVENOUS | Status: AC | PRN
  Administered 2018-08-07: 100 mL via INTRAVENOUS

## 2018-08-07 NOTE — Telephone Encounter (Signed)
Received note from Sue Lushndrea that his wife donna called and left a voicemail stating they had received the Forteo shipment but had some questions.  Left voicemail to call back.

## 2018-08-08 NOTE — Telephone Encounter (Signed)
Spoke to wife, they have received Forteo shipment. But patient and wife have some questions about some things they have read about the medication which concerned them, before he starts therapy. Requesting a return phone call.  Advised that new start visit will be scheduled after all concerns are addressed.  3:09 PM Dorthula Nettlesachael N Juliahna Wiswell, CPhT

## 2018-08-08 NOTE — Telephone Encounter (Signed)
Scheduled nurse visit for patient on 08/09/18 @ 9am

## 2018-08-09 ENCOUNTER — Ambulatory Visit: Payer: Medicare HMO | Admitting: Pharmacist

## 2018-08-13 NOTE — Telephone Encounter (Signed)
Left voicemail to reschedule canceled appointment to discuss Forteo risk vs. Benefit.

## 2018-08-13 NOTE — Telephone Encounter (Signed)
[facility].  They wanted to inform us about patient waiting to hold Forteo due to questions about side effects and safety. Let the pharmacist know that the patient called and we are aware.Will follow up with patient.

## 2018-08-30 ENCOUNTER — Ambulatory Visit (INDEPENDENT_AMBULATORY_CARE_PROVIDER_SITE_OTHER): Payer: Medicare HMO | Admitting: Specialist

## 2018-09-01 ENCOUNTER — Other Ambulatory Visit (HOSPITAL_COMMUNITY): Payer: Self-pay | Admitting: Specialist

## 2018-09-01 ENCOUNTER — Other Ambulatory Visit (INDEPENDENT_AMBULATORY_CARE_PROVIDER_SITE_OTHER): Payer: Self-pay | Admitting: Specialist

## 2018-09-02 NOTE — Telephone Encounter (Signed)
Gabapentin refill request 

## 2018-09-02 NOTE — Telephone Encounter (Signed)
Metformin refill request  

## 2018-09-09 ENCOUNTER — Ambulatory Visit (INDEPENDENT_AMBULATORY_CARE_PROVIDER_SITE_OTHER): Payer: Medicare HMO | Admitting: Specialist

## 2018-09-09 ENCOUNTER — Encounter (INDEPENDENT_AMBULATORY_CARE_PROVIDER_SITE_OTHER): Payer: Self-pay | Admitting: Specialist

## 2018-09-09 VITALS — BP 126/58 | HR 55 | Ht 74.0 in | Wt 214.0 lb

## 2018-09-09 DIAGNOSIS — M4620 Osteomyelitis of vertebra, site unspecified: Secondary | ICD-10-CM

## 2018-09-09 DIAGNOSIS — Z981 Arthrodesis status: Secondary | ICD-10-CM

## 2018-09-09 NOTE — Patient Instructions (Signed)
Plan: Avoid frequent bending and stooping  No lifting greater than 10 lbs. May use ice or moist heat for pain. Weight loss is of benefit. Avoid the use of arthritis NSAIDs medication. Exercise is important to improve your indurance and does allow people to function better inspite of back pain.

## 2018-09-09 NOTE — Progress Notes (Signed)
Post-Op Visit Note   Patient: Jeffrey Nelson           Date of Birth: 10/30/1934           MRN: [id number] Visit Date: 09/09/2018 PCP: Deloris Pingyter-Brown, Sherry M, MD   Assessment & Plan:  Chief Complaint:  Chief Complaint  Patient presents with  . Lower Back - Follow-up  Almost 2 1/2 months post removal of hardware, placement of antibiotic beads  Into the vertebral body of L3. CRP was 3 11/21, sed rate not done then due to age of the blood specimen. Motor both legs is intact, he notes weakness left thigh. He is no longer taking Narcotics. Visit Diagnoses:  1. Vertebral osteomyelitis, chronic (HCC)   2. Status post lumbar spinal fusion     Plan: Avoid frequent bending and stooping  No lifting greater than 10 lbs. May use ice or moist heat for pain. Weight loss is of benefit. Avoid the use of arthritis NSAIDs medication. Exercise is important to improve your indurance and does allow people to function better inspite of back pain.    Follow-Up Instructions: No follow-ups on file.   Orders:  No orders of the defined types were placed in this encounter.  No orders of the defined types were placed in this encounter.   Imaging: No results found.  PMFS History: Patient Active Problem List   Diagnosis Date Noted  . Discitis of lumbar region 06/19/2018    Priority: High    Class: Chronic  . Loosening of hardware in spine (HCC) 06/19/2018    Priority: High    Class: Chronic  . Anemia due to blood loss 12/13/2017    Priority: High    Class: Chronic  . Lumbar disc herniation with radiculopathy 10/13/2017    Priority: High    Class: Acute  . Right knee sprain 10/09/2017    Priority: High    Class: Acute  . Spinal stenosis in cervical region 05/14/2015    Priority: High    Class: Chronic  . Cubital tunnel syndrome on left 05/14/2015    Priority: High    Class: Chronic  . Spinal stenosis, lumbar region, with neurogenic claudication 01/16/2014    Priority: High   Class: Chronic  . Diabetes mellitus type 2, uncomplicated (HCC) 10/14/2017    Priority: Medium    Class: Chronic  . PICC (peripherally inserted central catheter) in place 07/22/2018  . Medication monitoring encounter 07/22/2018  . History of removal of retained hardware   . Coronary artery disease involving native coronary artery of native heart without angina pectoris   . Acute blood loss anemia   . Closed compression fracture of third lumbar vertebra (HCC) 06/18/2018  . Osteopenia of multiple sites 06/18/2018  . Essential hypertension 06/12/2018  . BPH (benign prostatic hyperplasia) 06/12/2018  . Lumbosacral radiculopathy at L3   . Chronic back pain 12/03/2017  . Subclavian artery stenosis, right (HCC) 12/03/2017  . Status post lumbar laminectomy 11/12/2017  . Postoperative pain after spinal surgery 11/11/2017  . Weakness of right lower extremity   . Sciatica associated with disorder of lumbosacral spine   . Surgery, elective   . Hip pain, acute, right 10/09/2017  . Bradycardia 10/09/2017  . Spondylolisthesis, lumbar region 10/05/2017  . Knee contusion 07/24/2016  . Stenosis of cervical spine with myelopathy (HCC) 05/14/2015   Past Medical History:  Diagnosis Date  . Anginal pain (HCC)    occ  . Arthritis    "joints" (10/09/2017)  .  Chronic lower back pain   . Complication of anesthesia    extremely claustrophobic; "have to be put down for MRI; don't sit in back seat of car;, etc."  . Coronary artery disease   . High cholesterol    "get shots twice/month" (10/09/2017)  . History of blood transfusion 2001   "related to CABG"  . History of kidney stones   . HOH (hard of hearing)    wearing hearing aids  . Hypertension   . Myocardial infarction (HCC) 2000s-06/2017 X 4  . Stroke [facility]) ~ 2015   "in my right eye; sight is coming back little by little" (10/09/2017)  . Type II diabetes mellitus (HCC)     Family History  Problem Relation Age of Onset  . CAD Brother   .  Cancer Brother   . Alzheimer's disease Mother   . Healthy Daughter   . Healthy Daughter   . Diabetes Neg Hx     Past Surgical History:  Procedure Laterality Date  . ANTERIOR CERVICAL DECOMP/DISCECTOMY FUSION N/A 05/14/2015   Procedure: C3-4  ANTERIOR CERVICAL DISCECTOMY AND FUSION WITH PLATE AND SCREWS, LOCAL AND ALLOGRAFT BONE GRAFT;  Surgeon: Kerrin Champagne, MD;  Location: [facility] OR;  Service: Orthopedics;  Laterality: N/A;  . BACK SURGERY    . CARDIAC CATHETERIZATION     X 17 (10/09/2017)  . CATARACT EXTRACTION W/ INTRAOCULAR LENS  IMPLANT, BILATERAL Bilateral   . COLONOSCOPY    . CORONARY ANGIOPLASTY  06/2017  . CORONARY ANGIOPLASTY WITH STENT PLACEMENT     "6 stents total" (10/09/2017)  . CORONARY ARTERY BYPASS GRAFT  2001   CABG x4   . CYSTOSCOPY W/ STONE MANIPULATION    . IR LUMBAR DISC ASPIRATION W/IMG GUIDE  06/14/2018  . JOINT REPLACEMENT    . LUMBAR LAMINECTOMY Right 10/13/2017   Procedure: MICRODISCECTOMY LUMBAR LAMINECTOMY RIGHT L3-4;  Surgeon: Kerrin Champagne, MD;  Location: [facility] OR;  Service: Orthopedics;  Laterality: Right;  . LUMBAR LAMINECTOMY Left 11/12/2017   Procedure: MICRODISCECTOMY L3-4 FOR RECURRENT HNP POSSIBLE FLURO;  Surgeon: Kerrin Champagne, MD;  Location: [facility] OR;  Service: Orthopedics;  Laterality: Left;  . LUMBAR LAMINECTOMY/DECOMPRESSION MICRODISCECTOMY N/A 01/16/2014   Procedure: Left L3-4 and L4-5 foraminotomies;  Surgeon: Kerrin Champagne, MD;  Location: [facility] OR;  Service: Orthopedics;  Laterality: N/A;  . NASAL FRACTURE SURGERY    . NASAL SINUS SURGERY     "several times since 1959"  . RADIOLOGY WITH ANESTHESIA N/A 10/12/2017   Procedure: MRI WITH ANESTHESIA;  Surgeon: Radiologist, Medication, MD;  Location: [facility] OR;  Service: Radiology;  Laterality: N/A;  . RADIOLOGY WITH ANESTHESIA N/A 12/05/2017   Procedure: MRI WITH ANESTHESIA;  Surgeon: Radiologist, Medication, MD;  Location: [facility] OR;  Service: Radiology;  Laterality: N/A;  . SHOULDER OPEN ROTATOR CUFF REPAIR  Bilateral   . TOTAL KNEE ARTHROPLASTY Right 2012  . TYMPANOSTOMY TUBE PLACEMENT Bilateral   . ULNAR NERVE TRANSPOSITION Left 05/14/2015   Procedure: LEFT ULNAR NERVE DECOMPRESSION AT THE ELBOW;  Surgeon: Kerrin Champagne, MD;  Location: [facility] OR;  Service: Orthopedics;  Laterality: Left;   Social History   Occupational History  . Not on file  Tobacco Use  . Smoking status: Former Smoker    Packs/day: 1.00    Years: 2.00    Pack years: 2.00    Last attempt to quit: 01/09/1957    Years since quitting: 61.7  . Smokeless tobacco: Never Used  Substance and Sexual Activity  . Alcohol  use: No  . Drug use: Never  . Sexual activity: Not Currently

## 2018-09-19 NOTE — Telephone Encounter (Signed)
Called to check status of the patient's renewal application, application is in process. Did patient ever start therapy?

## 2018-09-25 ENCOUNTER — Other Ambulatory Visit (HOSPITAL_COMMUNITY): Payer: Self-pay | Admitting: Specialist

## 2018-09-25 NOTE — Telephone Encounter (Signed)
Received fax from [facility], renewal application has been APPROVED. Coverage is from 09/20/2018 to 08/28/2019.  Will send document to scan Center.  Phone# [phone] Fax# [phone]

## 2018-09-26 NOTE — Telephone Encounter (Signed)
Metformin refill request  

## 2018-10-02 ENCOUNTER — Encounter: Payer: Self-pay | Admitting: Internal Medicine

## 2018-10-02 ENCOUNTER — Ambulatory Visit: Payer: Medicare HMO | Admitting: Internal Medicine

## 2018-10-02 DIAGNOSIS — Z9889 Other specified postprocedural states: Secondary | ICD-10-CM

## 2018-10-02 DIAGNOSIS — M4646 Discitis, unspecified, lumbar region: Secondary | ICD-10-CM

## 2018-10-02 NOTE — Progress Notes (Signed)
   Subjective:    Patient ID: Jeffrey Nelson, male    DOB: [birth year], 84 y.o.   MRN: [id number]  HPI He is here for follow-up of his recent hospitalization. He has a history of coronary artery disease and osteoporosis with compression fracture in the lumbar area who underwent lumbar fusion in April 2019 by Dr. Otelia SergeantNitka.  He had continued pain and in September of 2019 and went to the hospital for evaluation for the pain.  Prior to presentation and during this hospitalization his pain continued to worsen and was associated with subjective fever, night sweats and temperature changes and noted discitis in the lumbar L2-4 on MRI.  He then completed 6 weeks of IV antibiotics post surgery and I saw him 11/25 and his CRP was wnl, improving and his antibiotics were stopped.  Since then, he has continued to do well with improving pain and movment, not needing opioid medications anymore and overall well.  He is accompanied by his wife.      Review of Systems  Constitutional: Negative for fatigue and unexpected weight change.  Gastrointestinal: Negative for diarrhea and nausea.  Skin: Negative for rash.       Objective:   Physical Exam Constitutional:      General: He is not in acute distress.    Appearance: He is well-developed.  Cardiovascular:     Rate and Rhythm: Normal rate and regular rhythm.     Heart sounds: Normal heart sounds. No murmur.  Pulmonary:     Effort: Pulmonary effort is normal. No respiratory distress.     Breath sounds: Normal breath sounds.  Musculoskeletal:     Comments: Back with no tenderness, no warmth  Skin:    Findings: No rash.    SH: remote tobacco history       Assessment & Plan:

## 2018-10-02 NOTE — Assessment & Plan Note (Signed)
Treated and no clinical concerns now for infection.  He can return as needed.

## 2018-10-02 NOTE — Assessment & Plan Note (Signed)
No significant issues since removal for infection.

## 2018-10-23 ENCOUNTER — Ambulatory Visit (INDEPENDENT_AMBULATORY_CARE_PROVIDER_SITE_OTHER): Payer: Self-pay

## 2018-10-23 ENCOUNTER — Encounter (INDEPENDENT_AMBULATORY_CARE_PROVIDER_SITE_OTHER): Payer: Self-pay | Admitting: Specialist

## 2018-10-23 ENCOUNTER — Ambulatory Visit (INDEPENDENT_AMBULATORY_CARE_PROVIDER_SITE_OTHER): Payer: Medicare HMO | Admitting: Specialist

## 2018-10-23 VITALS — BP 123/58 | HR 57 | Ht 74.0 in | Wt 214.0 lb

## 2018-10-23 DIAGNOSIS — M4620 Osteomyelitis of vertebra, site unspecified: Secondary | ICD-10-CM | POA: Diagnosis not present

## 2018-10-23 DIAGNOSIS — Z981 Arthrodesis status: Secondary | ICD-10-CM | POA: Diagnosis not present

## 2018-10-23 MED ORDER — GABAPENTIN 300 MG PO CAPS: 300.0000 mg | ORAL_CAPSULE | Freq: Two times a day (BID) | ORAL | 6 refills | Status: AC

## 2018-10-23 NOTE — Patient Instructions (Signed)
Avoid frequent bending and stooping  No lifting greater than 10 lbs. May use ice or moist heat for pain. Weight loss is of benefit. Avoid medications like motrin,alleve. You may take arthritis strength tylenol up to 3-4 per day. Exercise is important to improve your indurance and does allow people to function better inspite of back pain.

## 2018-10-23 NOTE — Progress Notes (Signed)
Office Visit Note   Patient: Jeffrey Nelson           Date of Birth: Sep 24, 1934           MRN: [id number] Visit Date: 10/23/2018              Requested by: Deloris Ping, MD [address] [place], [place] [address] PCP: Deloris Ping, MD   Assessment & Plan: Visit Diagnoses:  1. Vertebral osteomyelitis, chronic (HCC)   2. Status post lumbar spinal fusion     Plan: Avoid frequent bending and stooping  No lifting greater than 10 lbs. May use ice or moist heat for pain. Weight loss is of benefit. Avoid medications like motrin,alleve. You may take arthritis strength tylenol up to 3-4 per day. Exercise is important to improve your indurance and does allow people to function better inspite of back pain.  Follow-Up Instructions: No follow-ups on file.   Orders:  Orders Placed This Encounter  Procedures  . XR Lumbar Spine 2-3 Views   No orders of the defined types were placed in this encounter.     Procedures: No procedures performed   Clinical Data: No additional findings.   Subjective: Chief Complaint  Patient presents with  . Lower Back - Follow-up    84 year old male with history of lumbar spinal stenosis and left recurring foramenal stenosis. Underwent a L3-4 and L4-5  TLIF and developed infection at the L3-4 level. Underwent incision and drainage and placement of stimulin beads into the spine at L3. He had a left hemibody stroke and is experiencing left anterior thigh pain. Over all he feels better but can only walk about 10 minutes and has decreased pain with sitting for a short period of time.    Review of Systems  Constitutional: Negative.   HENT: Negative.   Eyes: Negative.   Respiratory: Negative.   Cardiovascular: Negative.   Gastrointestinal: Negative.   Endocrine: Negative.   Genitourinary: Negative.   Musculoskeletal: Negative.   Skin: Negative.   Allergic/Immunologic: Negative.   Neurological: Negative.     Hematological: Negative.   Psychiatric/Behavioral: Negative.      Objective: Vital Signs: BP (!) 123/58 (BP Location: Left Arm, Patient Position: Sitting)   Pulse (!) 57   Ht  (1.88 m)   Wt 214 lb (97.1 kg)   BMI 27.48 kg/m   Physical Exam  Back Exam   Tenderness  The patient is experiencing tenderness in the lumbar.      Specialty Comments:  No specialty comments available.  Imaging: No results found.   PMFS History: Patient Active Problem List   Diagnosis Date Noted  . Discitis of lumbar region 06/19/2018    Priority: High    Class: Chronic  . Loosening of hardware in spine (HCC) 06/19/2018    Priority: High    Class: Chronic  . Anemia due to blood loss 12/13/2017    Priority: High    Class: Chronic  . Lumbar disc herniation with radiculopathy 10/13/2017    Priority: High    Class: Acute  . Right knee sprain 10/09/2017    Priority: High    Class: Acute  . Spinal stenosis in cervical region 05/14/2015    Priority: High    Class: Chronic  . Cubital tunnel syndrome on left 05/14/2015    Priority: High    Class: Chronic  . Spinal stenosis, lumbar region, with neurogenic claudication 01/16/2014    Priority: High  Class: Chronic  . Diabetes mellitus type 2, uncomplicated (HCC) 10/14/2017    Priority: Medium    Class: Chronic  . History of removal of retained hardware   . Coronary artery disease involving native coronary artery of native heart without angina pectoris   . Closed compression fracture of third lumbar vertebra (HCC) 06/18/2018  . Osteopenia of multiple sites 06/18/2018  . Essential hypertension 06/12/2018  . BPH (benign prostatic hyperplasia) 06/12/2018  . Lumbosacral radiculopathy at L3   . Chronic back pain 12/03/2017  . Subclavian artery stenosis, right (HCC) 12/03/2017  . Status post lumbar laminectomy 11/12/2017  . Postoperative pain after spinal surgery 11/11/2017  . Weakness of right lower extremity   . Sciatica  associated with disorder of lumbosacral spine   . Surgery, elective   . Hip pain, acute, right 10/09/2017  . Bradycardia 10/09/2017  . Spondylolisthesis, lumbar region 10/05/2017  . Knee contusion 07/24/2016  . Stenosis of cervical spine with myelopathy (HCC) 05/14/2015   Past Medical History:  Diagnosis Date  . Anginal pain (HCC)    occ  . Arthritis    "joints" (10/09/2017)  . Chronic lower back pain   . Complication of anesthesia    extremely claustrophobic; "have to be put down for MRI; don't sit in back seat of car;, etc."  . Coronary artery disease   . High cholesterol    "get shots twice/month" (10/09/2017)  . History of blood transfusion 2001   "related to CABG"  . History of kidney stones   . HOH (hard of hearing)    wearing hearing aids  . Hypertension   . Myocardial infarction (HCC) 2000s-06/2017 X 4  . Stroke [facility]) ~ 2015   "in my right eye; sight is coming back little by little" (10/09/2017)  . Type II diabetes mellitus (HCC)     Family History  Problem Relation Age of Onset  . CAD Brother   . Cancer Brother   . Alzheimer's disease Mother   . Healthy Daughter   . Healthy Daughter   . Diabetes Neg Hx     Past Surgical History:  Procedure Laterality Date  . ANTERIOR CERVICAL DECOMP/DISCECTOMY FUSION N/A 05/14/2015   Procedure: C3-4  ANTERIOR CERVICAL DISCECTOMY AND FUSION WITH PLATE AND SCREWS, LOCAL AND ALLOGRAFT BONE GRAFT;  Surgeon: Kerrin Champagne, MD;  Location: [facility] OR;  Service: Orthopedics;  Laterality: N/A;  . BACK SURGERY    . CARDIAC CATHETERIZATION     X 17 (10/09/2017)  . CATARACT EXTRACTION W/ INTRAOCULAR LENS  IMPLANT, BILATERAL Bilateral   . COLONOSCOPY    . CORONARY ANGIOPLASTY  06/2017  . CORONARY ANGIOPLASTY WITH STENT PLACEMENT     "6 stents total" (10/09/2017)  . CORONARY ARTERY BYPASS GRAFT  2001   CABG x4   . CYSTOSCOPY W/ STONE MANIPULATION    . IR LUMBAR DISC ASPIRATION W/IMG GUIDE  06/14/2018  . JOINT REPLACEMENT    . LUMBAR  LAMINECTOMY Right 10/13/2017   Procedure: MICRODISCECTOMY LUMBAR LAMINECTOMY RIGHT L3-4;  Surgeon: Kerrin Champagne, MD;  Location: [facility] OR;  Service: Orthopedics;  Laterality: Right;  . LUMBAR LAMINECTOMY Left 11/12/2017   Procedure: MICRODISCECTOMY L3-4 FOR RECURRENT HNP POSSIBLE FLURO;  Surgeon: Kerrin Champagne, MD;  Location: [facility] OR;  Service: Orthopedics;  Laterality: Left;  . LUMBAR LAMINECTOMY/DECOMPRESSION MICRODISCECTOMY N/A 01/16/2014   Procedure: Left L3-4 and L4-5 foraminotomies;  Surgeon: Kerrin Champagne, MD;  Location: [facility] OR;  Service: Orthopedics;  Laterality: N/A;  . NASAL FRACTURE SURGERY    .  NASAL SINUS SURGERY     "several times since 1959"  . RADIOLOGY WITH ANESTHESIA N/A 10/12/2017   Procedure: MRI WITH ANESTHESIA;  Surgeon: Radiologist, Medication, MD;  Location: [facility] OR;  Service: Radiology;  Laterality: N/A;  . RADIOLOGY WITH ANESTHESIA N/A 12/05/2017   Procedure: MRI WITH ANESTHESIA;  Surgeon: Radiologist, Medication, MD;  Location: [facility] OR;  Service: Radiology;  Laterality: N/A;  . SHOULDER OPEN ROTATOR CUFF REPAIR Bilateral   . TOTAL KNEE ARTHROPLASTY Right 2012  . TYMPANOSTOMY TUBE PLACEMENT Bilateral   . ULNAR NERVE TRANSPOSITION Left 05/14/2015   Procedure: LEFT ULNAR NERVE DECOMPRESSION AT THE ELBOW;  Surgeon: Kerrin Champagne, MD;  Location: [facility] OR;  Service: Orthopedics;  Laterality: Left;   Social History   Occupational History  . Not on file  Tobacco Use  . Smoking status: Former Smoker    Packs/day: 1.00    Years: 2.00    Pack years: 2.00    Last attempt to quit: 01/09/1957    Years since quitting: 61.8  . Smokeless tobacco: Never Used  Substance and Sexual Activity  . Alcohol use: No  . Drug use: Never  . Sexual activity: Not Currently

## 2018-10-24 ENCOUNTER — Encounter (INDEPENDENT_AMBULATORY_CARE_PROVIDER_SITE_OTHER): Payer: Self-pay | Admitting: Specialist

## 2018-10-27 ENCOUNTER — Other Ambulatory Visit (HOSPITAL_COMMUNITY): Payer: Self-pay | Admitting: Specialist

## 2018-10-28 NOTE — Telephone Encounter (Signed)
Metformin refill request  

## 2018-11-01 ENCOUNTER — Other Ambulatory Visit (HOSPITAL_COMMUNITY): Payer: Self-pay | Admitting: Specialist

## 2018-11-04 NOTE — Telephone Encounter (Signed)
Metformin refill request  

## 2018-11-13 ENCOUNTER — Other Ambulatory Visit (HOSPITAL_COMMUNITY): Payer: Self-pay | Admitting: Specialist

## 2019-01-22 ENCOUNTER — Ambulatory Visit: Payer: Medicare HMO | Admitting: Specialist

## 2019-01-22 ENCOUNTER — Encounter: Payer: Self-pay | Admitting: Specialist

## 2019-01-22 ENCOUNTER — Other Ambulatory Visit: Payer: Self-pay

## 2019-01-22 ENCOUNTER — Ambulatory Visit (INDEPENDENT_AMBULATORY_CARE_PROVIDER_SITE_OTHER): Payer: Medicare HMO

## 2019-01-22 VITALS — BP 133/61 | HR 56 | Ht 74.0 in | Wt 216.0 lb

## 2019-01-22 DIAGNOSIS — M545 Low back pain: Secondary | ICD-10-CM

## 2019-01-22 DIAGNOSIS — M25512 Pain in left shoulder: Secondary | ICD-10-CM | POA: Diagnosis not present

## 2019-01-22 DIAGNOSIS — M7582 Other shoulder lesions, left shoulder: Secondary | ICD-10-CM

## 2019-01-22 DIAGNOSIS — Z981 Arthrodesis status: Secondary | ICD-10-CM

## 2019-01-22 DIAGNOSIS — M7542 Impingement syndrome of left shoulder: Secondary | ICD-10-CM

## 2019-01-22 DIAGNOSIS — M778 Other enthesopathies, not elsewhere classified: Secondary | ICD-10-CM

## 2019-01-22 MED ORDER — BUPIVACAINE HCL 0.25 % IJ SOLN
4.0000 mL | INTRAMUSCULAR | Status: AC | PRN
  Administered 2019-01-22: 10:00:00 4 mL via INTRA_ARTICULAR

## 2019-01-22 MED ORDER — METHYLPREDNISOLONE ACETATE 40 MG/ML IJ SUSP
40.0000 mg | INTRAMUSCULAR | Status: AC | PRN
  Administered 2019-01-22: 40 mg via INTRA_ARTICULAR

## 2019-01-22 NOTE — Patient Instructions (Signed)
Voltaren gel 4 grams to the left shoulder up to four times per day. Shoulder exercises. Ice packs left shoulder 30-60 min on and 30 min off for 48 hours. Lumbar spine has loss of normal curvature due to collapse of the disc at L2-3 and  The adjacent vertebrae due to infection. No sign of active infection but the resulting  Loss of height of the front of the spine causes stooping. Extension exercises of the hips  Helps with adapting to the change in spine alignment. Stationary bike and pool walking.

## 2019-01-22 NOTE — Progress Notes (Signed)
Office Visit Note   Patient: Jeffrey Nelson           Date of Birth: [birth year]           MRN: [id number] Visit Date: 01/22/2019              Requested by: Deloris Ping, MD [address] [place], [place] [address] PCP: Deloris Ping, MD   Assessment & Plan: Visit Diagnoses:  1. Status post lumbar spinal fusion   2. Left shoulder pain, unspecified chronicity   3. Left shoulder tendonitis   4. Impingement syndrome of left shoulder     Plan: Voltaren gel 4 grams to the left shoulder up to four times per day. Shoulder exercises. Ice packs left shoulder 30-60 min on and 30 min off for 48 hours. Lumbar spine has loss of normal curvature due to collapse of the disc at L2-3 and  The adjacent vertebrae due to infection. No sign of active infection but the resulting  Loss of height of the front of the spine causes stooping. Extension exercises of the hips  Helps with adapting to the change in spine alignment. Stationary bike and pool walking.    Follow-Up Instructions: No follow-ups on file.   Orders:  Orders Placed This Encounter  Procedures  . Large Joint Inj  . XR Lumbar Spine 2-3 Views  . XR Shoulder Left   No orders of the defined types were placed in this encounter.     Procedures: Large Joint Inj: L subacromial bursa on 01/22/2019 10:24 AM Indications: pain Details: 25 G 1.5 in needle, anterolateral approach  Arthrogram: No  Medications: 40 mg methylPREDNISolone acetate 40 MG/ML; 4 mL bupivacaine 0.25 % Outcome: tolerated well, no immediate complications  Bandaid Procedure, treatment alternatives, risks and benefits explained, specific risks discussed. Consent was given by the patient. Immediately prior to procedure a time out was called to verify the correct patient, procedure, equipment, support staff and site/side marked as required. Patient was prepped and draped in the usual sterile fashion.       Clinical Data: No  additional findings.   Subjective: Chief Complaint  Patient presents with  . Lower Back - Follow-up    84 year old male right handed 7 months post I&D of lumbar abscess and osteomyelotis with stimulant beads. He is off all pain meds and antibiotics. No fever of chills. Mainly back pain with standing and walking and left shoulder pain. No bowel or bladder difficulty. Left shoulder with history of left shoulder Arthroscopy by Dr. Wyline Mood a long time ago now he is having pain with raising the left arm over head and lying on the left shoulder increase the pain. No numbness or tingling in the left arm. Some right neck pain and stiffness, gets some cramping in the right neck. The left arm is okay with starting mower. He has been able to mow the yard and grocery shop. He is practicing social distancing and using a facemask when out of the house.   Review of Systems  Constitutional: Negative.   HENT: Negative.   Eyes: Negative.   Respiratory: Negative.   Cardiovascular: Negative.   Gastrointestinal: Negative.   Endocrine: Negative.   Genitourinary: Negative.   Musculoskeletal: Negative.   Skin: Negative.   Allergic/Immunologic: Negative.   Neurological: Negative.   Hematological: Negative.   Psychiatric/Behavioral: Negative.      Objective: Vital Signs: BP 133/61 (BP Location: Left Arm, Patient Position: Sitting)   Pulse (!) 56  Ht  (1.88 m)   Wt 216 lb (98 kg)   BMI 27.73 kg/m   Physical Exam Constitutional:      Appearance: He is well-developed.  HENT:     Head: Normocephalic and atraumatic.  Eyes:     Pupils: Pupils are equal, round, and reactive to light.  Neck:     Musculoskeletal: Normal range of motion and neck supple.  Pulmonary:     Effort: Pulmonary effort is normal.     Breath sounds: Normal breath sounds.  Abdominal:     General: Bowel sounds are normal.     Palpations: Abdomen is soft.  Skin:    General: Skin is warm and dry.  Neurological:      Mental Status: He is alert and oriented to person, place, and time.  Psychiatric:        Behavior: Behavior normal.        Thought Content: Thought content normal.        Judgment: Judgment normal.     Back Exam   Tenderness  The patient is experiencing tenderness in the lumbar.  Range of Motion  Extension: abnormal  Flexion: abnormal  Lateral bend right: normal  Lateral bend left: normal  Rotation right: normal  Rotation left: normal   Muscle Strength  Right Quadriceps:  5/5  Left Quadriceps:  5/5  Right Hamstrings:  5/5  Left Hamstrings:  5/5   Tests  Straight leg raise right: negative Straight leg raise left: negative  Reflexes  Patellar: normal Achilles: normal Babinski's sign: normal   Other  Toe walk: normal Heel walk: normal Sensation: normal Erythema: no back redness Scars: absent  Comments:  Had a stroke and weakness in the left arm and left leg.   Left Shoulder Exam   Tenderness  The patient is experiencing tenderness in the acromion.  Range of Motion  Active abduction: normal  Passive abduction: normal  Extension: normal  External rotation: normal  Forward flexion: normal  Internal rotation 0 degrees: T5  Internal rotation 90 degrees: 80   Muscle Strength  Abduction: 5/5  Internal rotation: 5/5  External rotation: 5/5  Supraspinatus: 5/5  Subscapularis: 5/5  Biceps: 5/5   Tests  Impingement: positive  Other  Erythema: absent Scars: absent Sensation: normal Pulse: present       Specialty Comments:  No specialty comments available.  Imaging: Xr Lumbar Spine 2-3 Views  Result Date: 01/22/2019 AP and lateral flexion and extension radiographs of the lumbar spine show right lumbar scoliosis fromL2 to L5 15 degrees apex right at L4. There is large osteophytes laterally at L3-4, TLIFS at L4-5 and L3-4 appear to be solid with lordosis across these segments. The L2-3 disc Is completely collapsed and there is 0 degrees of lordosis  from the superior end plate of L2 to the inferior endplate of L5. Findings suggest autofusion of L2-3 likely due to discitis and loss of lordosis at the the L2-3 level with flat back deformity due to disc collapse above L3-L5 surgical fusion. No significant lysis. Calcification of the abdomenal aorta is present as seen previously.  Xr Shoulder Left  Result Date: 01/22/2019 Left shoulder 3 way view shows mild DJD of the left glenohumeral joint with hypertrophic changes of the humeral head, changes over the greater tuberosity suggest previous bone trough for RCR. DCR is apparent and normal appearance Type 2 acromion, Mild GH joint narrowing and minimal anterior and posterior glenoid spurs.     PMFS History: Patient Active Problem  List   Diagnosis Date Noted  . Discitis of lumbar region 06/19/2018    Priority: High    Class: Chronic  . Loosening of hardware in spine (HCC) 06/19/2018    Priority: High    Class: Chronic  . Anemia due to blood loss 12/13/2017    Priority: High    Class: Chronic  . Lumbar disc herniation with radiculopathy 10/13/2017    Priority: High    Class: Acute  . Right knee sprain 10/09/2017    Priority: High    Class: Acute  . Spinal stenosis in cervical region 05/14/2015    Priority: High    Class: Chronic  . Cubital tunnel syndrome on left 05/14/2015    Priority: High    Class: Chronic  . Spinal stenosis, lumbar region, with neurogenic claudication 01/16/2014    Priority: High    Class: Chronic  . Diabetes mellitus type 2, uncomplicated (HCC) 10/14/2017    Priority: Medium    Class: Chronic  . History of removal of retained hardware   . Coronary artery disease involving native coronary artery of native heart without angina pectoris   . Closed compression fracture of third lumbar vertebra (HCC) 06/18/2018  . Osteopenia of multiple sites 06/18/2018  . Essential hypertension 06/12/2018  . BPH (benign prostatic hyperplasia) 06/12/2018  . Lumbosacral  radiculopathy at L3   . Chronic back pain 12/03/2017  . Subclavian artery stenosis, right (HCC) 12/03/2017  . Status post lumbar laminectomy 11/12/2017  . Postoperative pain after spinal surgery 11/11/2017  . Weakness of right lower extremity   . Sciatica associated with disorder of lumbosacral spine   . Surgery, elective   . Hip pain, acute, right 10/09/2017  . Bradycardia 10/09/2017  . Spondylolisthesis, lumbar region 10/05/2017  . Knee contusion 07/24/2016  . Stenosis of cervical spine with myelopathy (HCC) 05/14/2015   Past Medical History:  Diagnosis Date  . Anginal pain (HCC)    occ  . Arthritis    "joints" (10/09/2017)  . Chronic lower back pain   . Complication of anesthesia    extremely claustrophobic; "have to be put down for MRI; don't sit in back seat of car;, etc."  . Coronary artery disease   . High cholesterol    "get shots twice/month" (10/09/2017)  . History of blood transfusion 2001   "related to CABG"  . History of kidney stones   . HOH (hard of hearing)    wearing hearing aids  . Hypertension   . Myocardial infarction (HCC) 2000s-06/2017 X 4  . Stroke [facility]) ~ 2015   "in my right eye; sight is coming back little by little" (10/09/2017)  . Type II diabetes mellitus (HCC)     Family History  Problem Relation Age of Onset  . CAD Brother   . Cancer Brother   . Alzheimer's disease Mother   . Healthy Daughter   . Healthy Daughter   . Diabetes Neg Hx     Past Surgical History:  Procedure Laterality Date  . ANTERIOR CERVICAL DECOMP/DISCECTOMY FUSION N/A 05/14/2015   Procedure: C3-4  ANTERIOR CERVICAL DISCECTOMY AND FUSION WITH PLATE AND SCREWS, LOCAL AND ALLOGRAFT BONE GRAFT;  Surgeon: Kerrin ChampagneJames E Quetzalli Clos, MD;  Location: [facility] OR;  Service: Orthopedics;  Laterality: N/A;  . BACK SURGERY    . CARDIAC CATHETERIZATION     X 17 (10/09/2017)  . CATARACT EXTRACTION W/ INTRAOCULAR LENS  IMPLANT, BILATERAL Bilateral   . COLONOSCOPY    . CORONARY ANGIOPLASTY  06/2017  .  CORONARY ANGIOPLASTY WITH STENT PLACEMENT     "6 stents total" (10/09/2017)  . CORONARY ARTERY BYPASS GRAFT  2001   CABG x4   . CYSTOSCOPY W/ STONE MANIPULATION    . IR LUMBAR DISC ASPIRATION W/IMG GUIDE  06/14/2018  . JOINT REPLACEMENT    . LUMBAR LAMINECTOMY Right 10/13/2017   Procedure: MICRODISCECTOMY LUMBAR LAMINECTOMY RIGHT L3-4;  Surgeon: Kerrin Champagne, MD;  Location: [facility] OR;  Service: Orthopedics;  Laterality: Right;  . LUMBAR LAMINECTOMY Left 11/12/2017   Procedure: MICRODISCECTOMY L3-4 FOR RECURRENT HNP POSSIBLE FLURO;  Surgeon: Kerrin Champagne, MD;  Location: [facility] OR;  Service: Orthopedics;  Laterality: Left;  . LUMBAR LAMINECTOMY/DECOMPRESSION MICRODISCECTOMY N/A 01/16/2014   Procedure: Left L3-4 and L4-5 foraminotomies;  Surgeon: Kerrin Champagne, MD;  Location: [facility] OR;  Service: Orthopedics;  Laterality: N/A;  . NASAL FRACTURE SURGERY    . NASAL SINUS SURGERY     "several times since 1959"  . RADIOLOGY WITH ANESTHESIA N/A 10/12/2017   Procedure: MRI WITH ANESTHESIA;  Surgeon: Radiologist, Medication, MD;  Location: [facility] OR;  Service: Radiology;  Laterality: N/A;  . RADIOLOGY WITH ANESTHESIA N/A 12/05/2017   Procedure: MRI WITH ANESTHESIA;  Surgeon: Radiologist, Medication, MD;  Location: [facility] OR;  Service: Radiology;  Laterality: N/A;  . SHOULDER OPEN ROTATOR CUFF REPAIR Bilateral   . TOTAL KNEE ARTHROPLASTY Right 2012  . TYMPANOSTOMY TUBE PLACEMENT Bilateral   . ULNAR NERVE TRANSPOSITION Left 05/14/2015   Procedure: LEFT ULNAR NERVE DECOMPRESSION AT THE ELBOW;  Surgeon: Kerrin Champagne, MD;  Location: [facility] OR;  Service: Orthopedics;  Laterality: Left;   Social History   Occupational History  . Not on file  Tobacco Use  . Smoking status: Former Smoker    Packs/day: 1.00    Years: 2.00    Pack years: 2.00    Last attempt to quit: 01/09/1957    Years since quitting: 62.0  . Smokeless tobacco: Never Used  Substance and Sexual Activity  . Alcohol use: No  . Drug use: Never  . Sexual  activity: Not Currently

## 2019-01-25 ENCOUNTER — Telehealth: Payer: Self-pay

## 2019-01-25 ENCOUNTER — Other Ambulatory Visit: Payer: Medicare HMO

## 2019-01-25 DIAGNOSIS — Z20822 Contact with and (suspected) exposure to covid-19: Secondary | ICD-10-CM

## 2019-01-25 NOTE — Telephone Encounter (Signed)
Left vm for patient to call back in VQ:[id number] following covid exposure. UTA covid s/sx following exposure

## 2019-01-25 NOTE — Addendum Note (Signed)
Addended by: Phillips Odor on: 01/25/2019 02:07 PM   Modules accepted: Orders

## 2019-01-25 NOTE — Telephone Encounter (Signed)
Wife returned call.  Advised of poss. Exposure to COVID 19, at recent office appt. At [facility].  Offered appt. For COVID 19 screening.  Appt. Scheduled at 3:00 PM, at the [facility] test site.  Verb. Understanding.

## 2019-01-27 LAB — NOVEL CORONAVIRUS, NAA: SARS-CoV-2, NAA: NOT DETECTED

## 2019-01-30 IMAGING — MR MR HIP*R* W/O CM
4 of 5 series · 19 of 40 positions shown · non-contrast
Comparison: None.

EXAM:
MR OF THE RIGHT HIP WITHOUT CONTRAST
TECHNIQUE: Multiplanar, multisequence MR imaging was performed. No intravenous
contrast was administered.

[Series 4: T1 · coronal · 4.0mm · 0.82mm/px · 3 of 28 slices shown (1 of 2)]
[im 5/28]
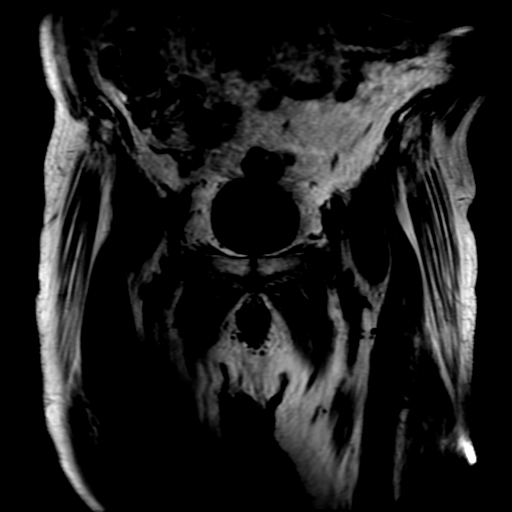
[im 14/28]
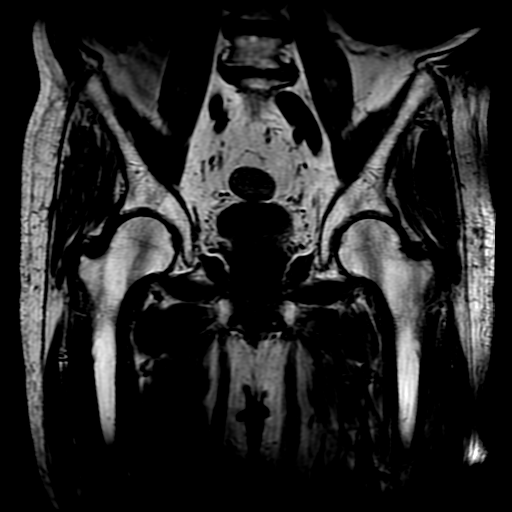
[im 23/28]
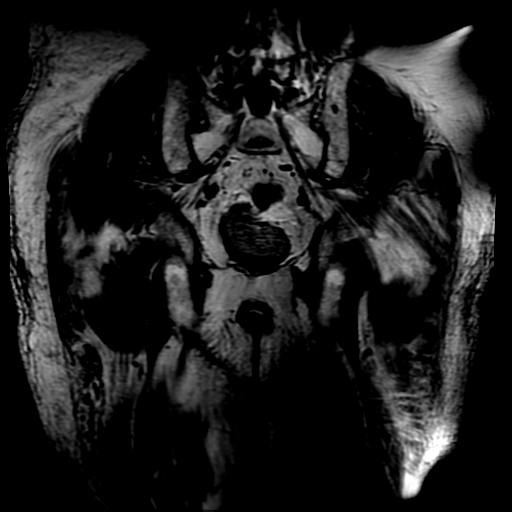

[Series 6: T2 fat-sat · axial · 4.0mm · 0.78mm/px · z∈[-30,+119]mm · 5 of 36 slices shown]
[im 1/36]
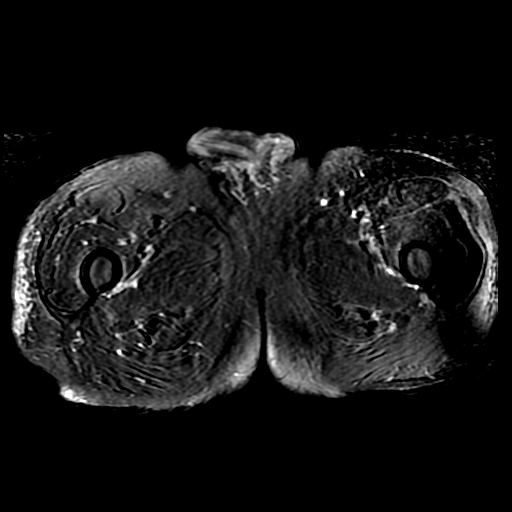
[im 5/36]
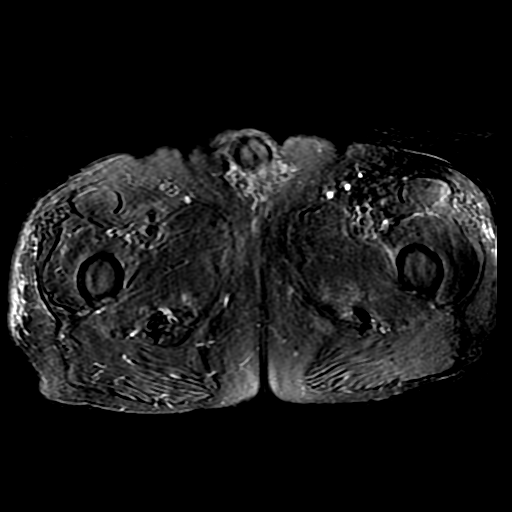
[im 9/36]
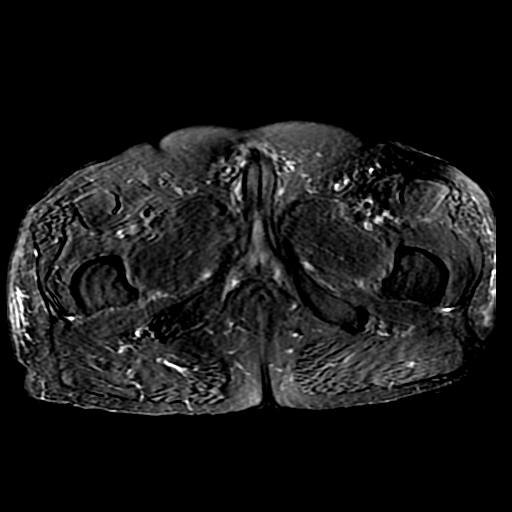
[im 18/36]
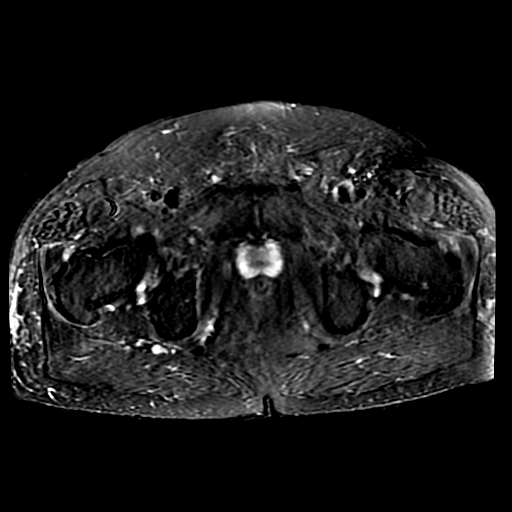
[im 31/36]
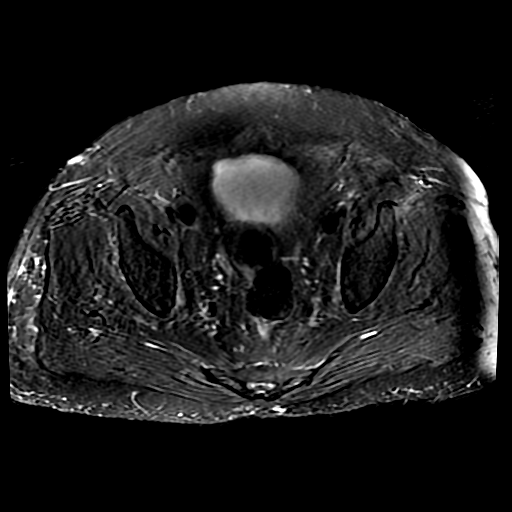

[Series 8: T1 · axial · 4.0mm · 0.78mm/px · z∈[-10,+119]mm · 3 of 36 slices shown (2 of 2)]
[im 5/36]
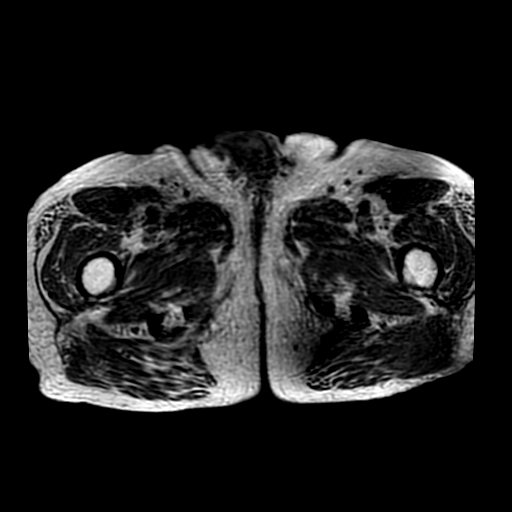
[im 18/36]
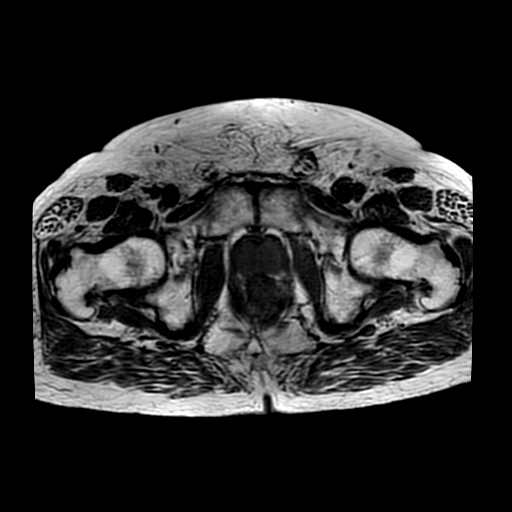
[im 31/36]
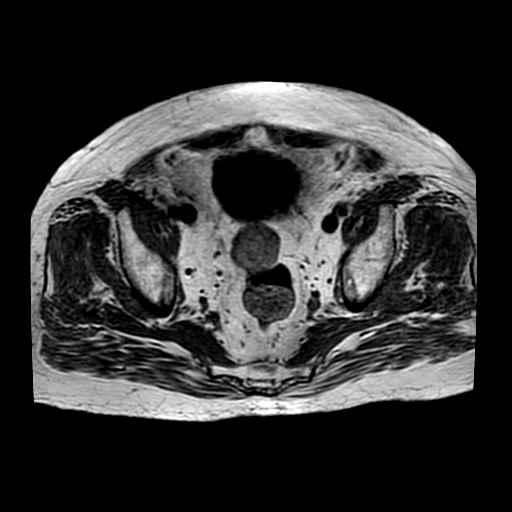

[Series 9: PD fat-sat · sagittal · 4.0mm · 0.35mm/px · 8 of 30 slices shown]
[im 1/30]
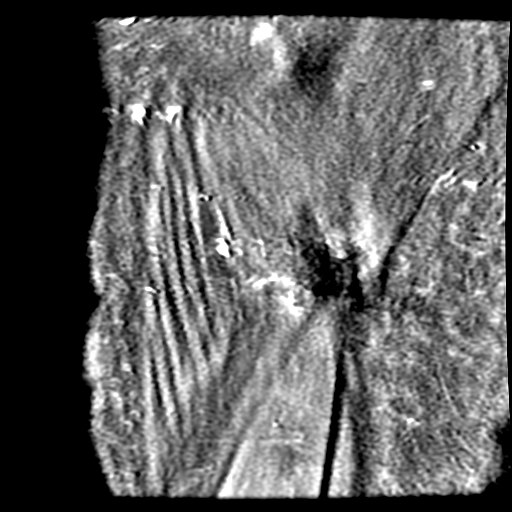
[im 5/30]
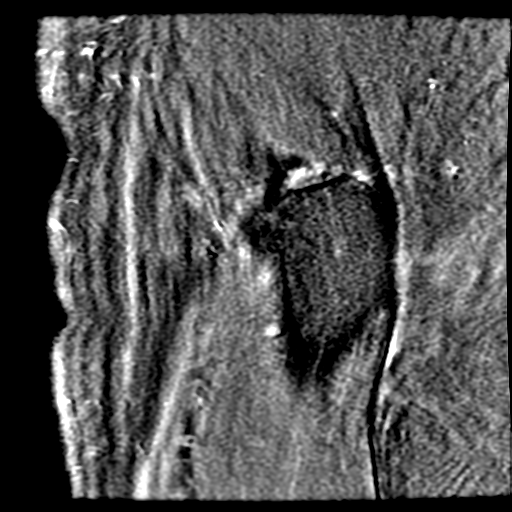
[im 9/30]
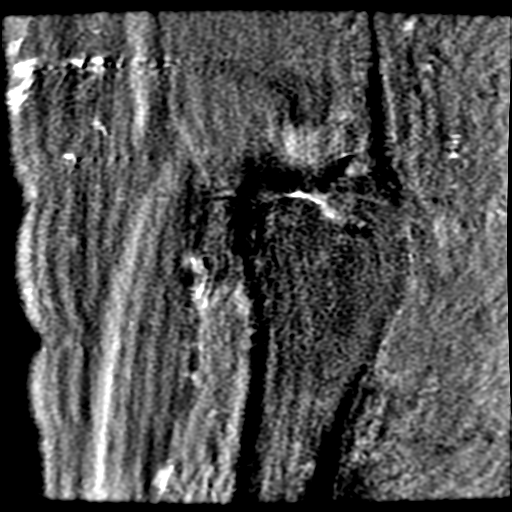
[im 13/30]
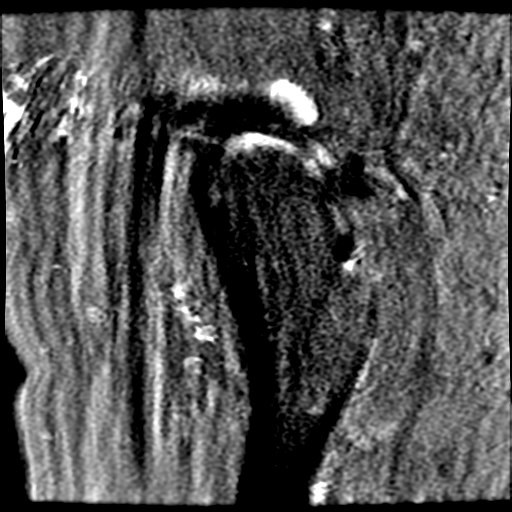
[im 17/30]
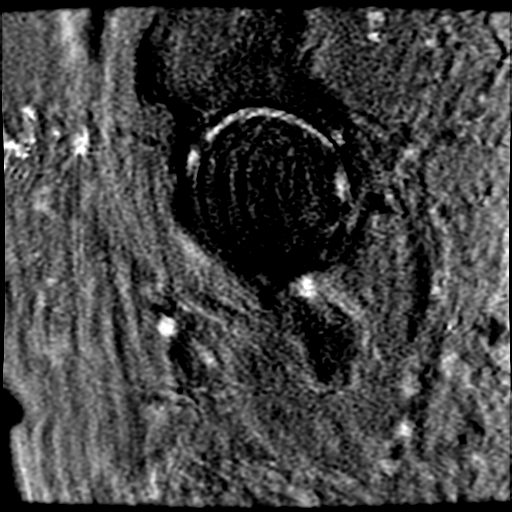
[im 21/30]
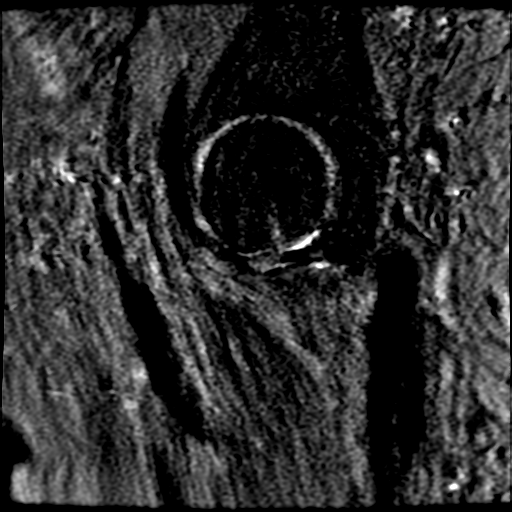
[im 25/30]
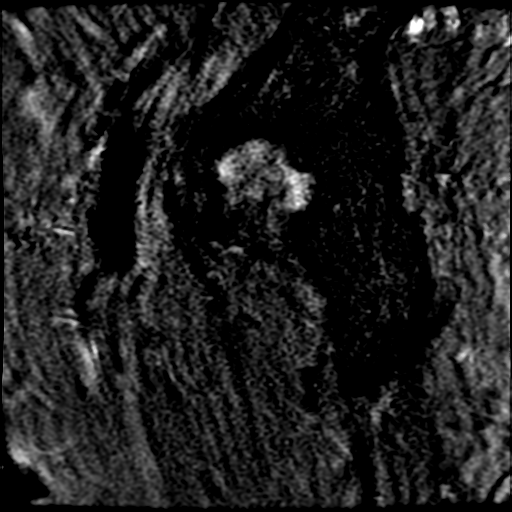
[im 30/30]
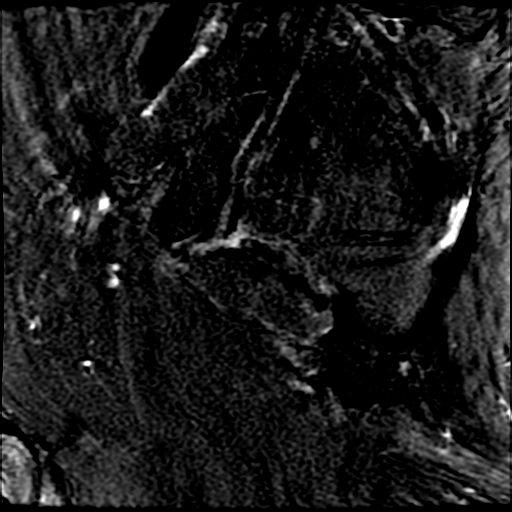

[19 of 40 positions shown; findings below may reference images not displayed]

FINDINGS: Patient motion degrades image quality limiting evaluation.

Bones: No hip fracture, dislocation or avascular necrosis. No
periosteal reaction or bone destruction. No aggressive osseous
lesion.

Normal sacrum and sacroiliac joints. No SI joint widening or erosive
changes.

Degenerative disc disease with disc height loss at L3-4 and L4-5.

Articular cartilage and labrum

Articular cartilage: High-grade partial-thickness cartilage loss of
the right acetabulum and femoral head. High-grade partial-thickness
cartilage loss of the left acetabulum and femoral head.

Labrum: Right labral degeneration with a superior right labral tear
and a 8 x 20 mm paralabral cyst.

Joint or bursal effusion

Joint effusion:  No hip joint effusion.  No SI joint effusion.

Bursae:  No bursa formation.

Muscles and tendons

Flexors: Normal.

Extensors: Normal.

Abductors: Normal.

Adductors: Normal.

Gluteals: Normal.

Hamstrings: Normal.

Other findings

Miscellaneous: No pelvic free fluid. No fluid collection or
hematoma. No inguinal lymphadenopathy. No inguinal hernia.
IMPRESSION: 1. Moderate osteoarthritis of the right hip.
2. No hip fracture, dislocation or avascular necrosis.
3. Lower lumbar spine spondylosis.

## 2019-10-11 IMAGING — RF DG LUMBAR SPINE 2-3V
1 series · 9 of 9 positions shown · non-contrast
Comparison: No recent prior.

CLINICAL DATA: ELECTIVE

EXAM:
DG C-ARM 61-120 MIN; LUMBAR SPINE - 2-3 VIEW

[Series 1: run · 9 of 9 slices shown]
[im 1/9]
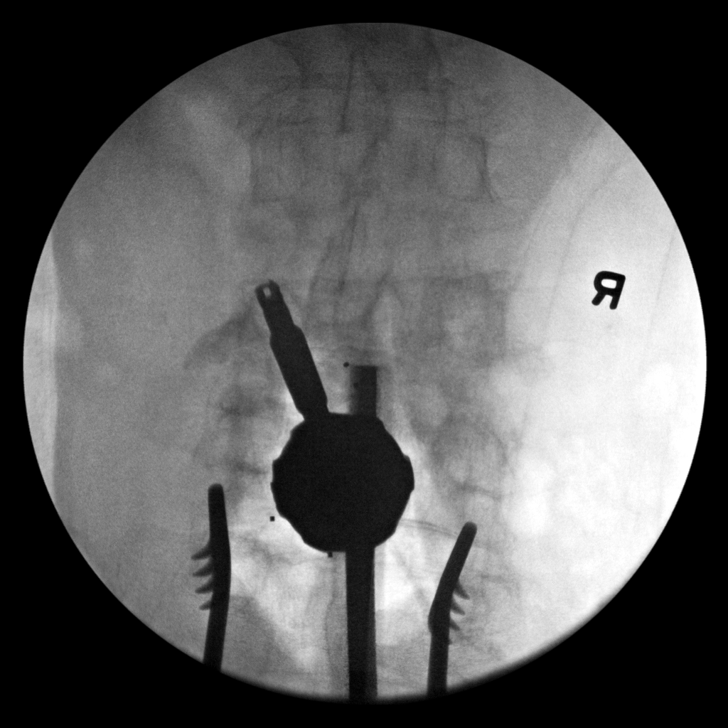
[im 2/9]
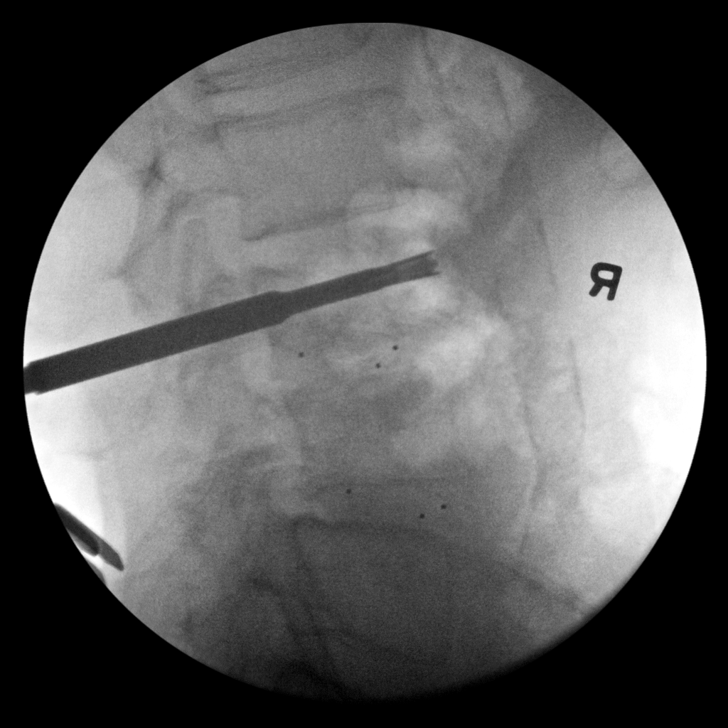
[im 3/9]
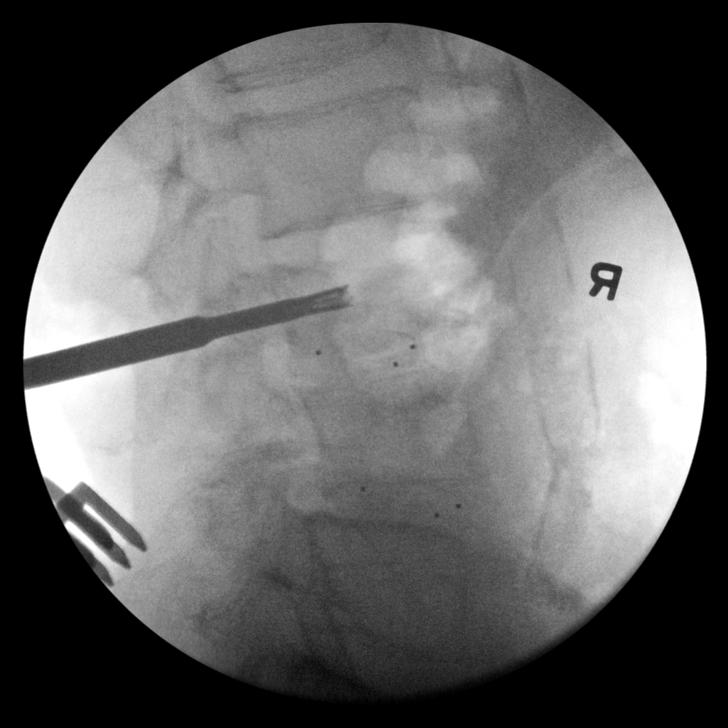
[im 4/9]
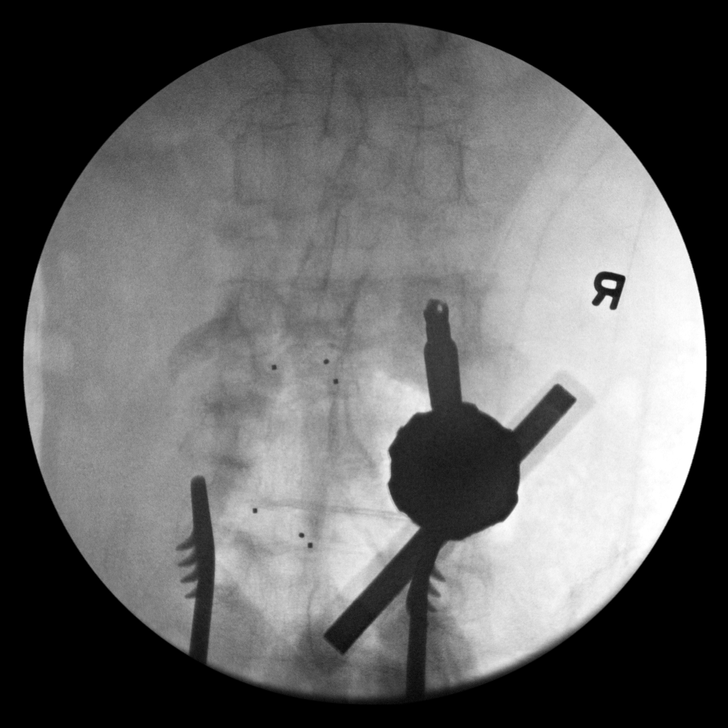
[im 5/9]
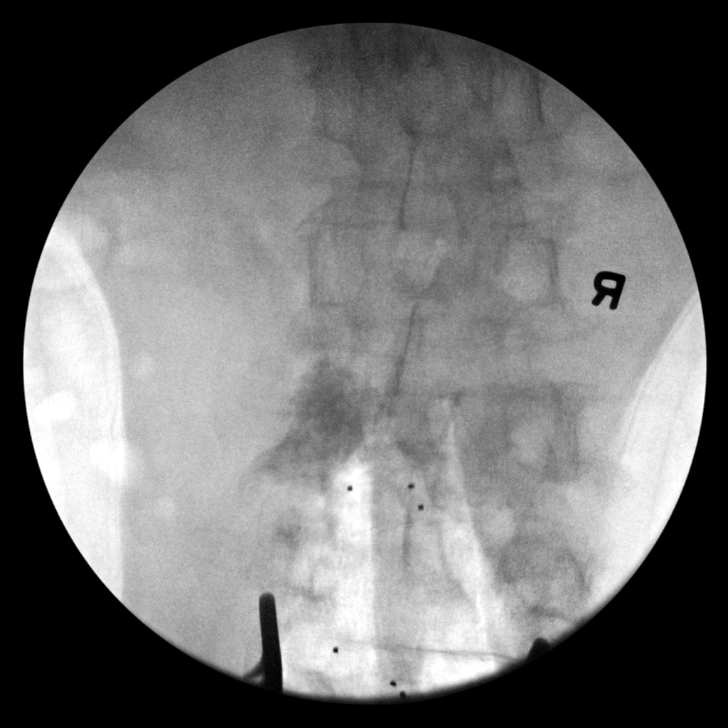
[im 6/9]
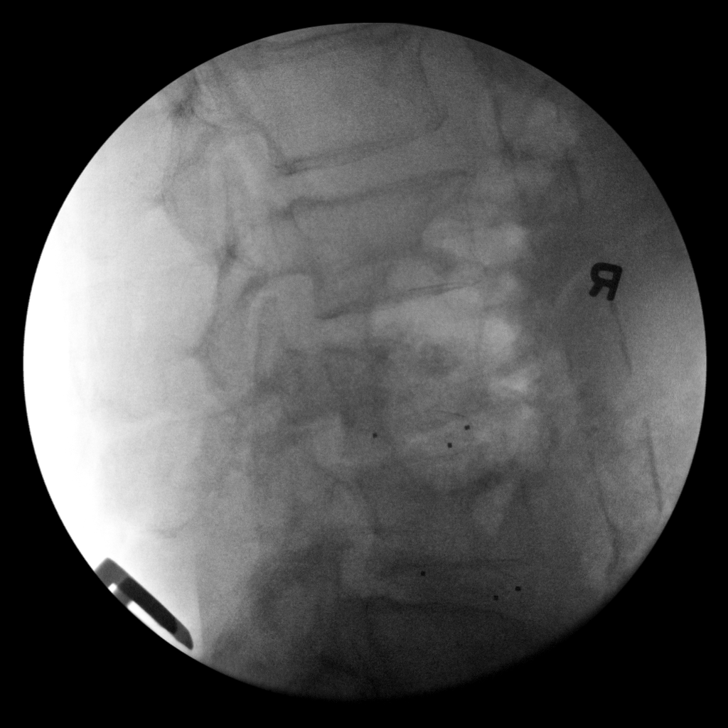
[im 7/9]
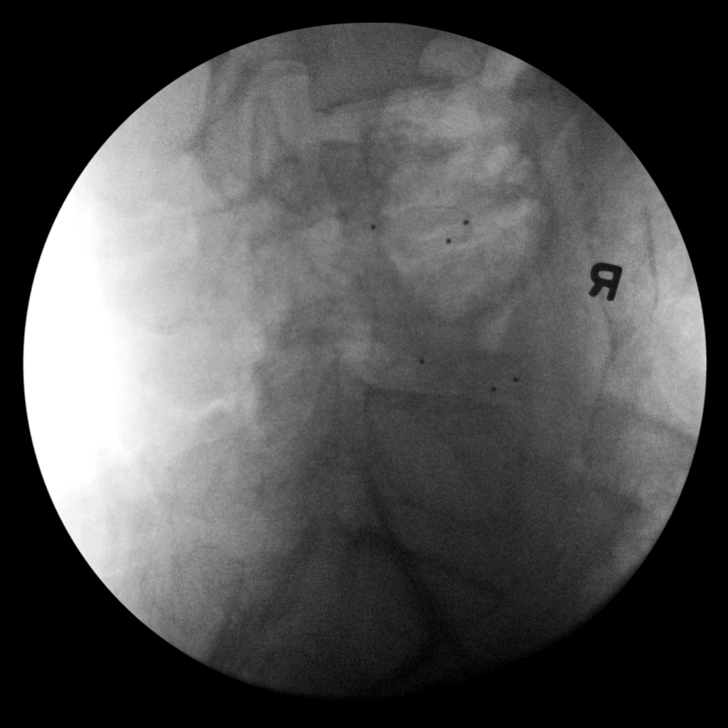
[im 8/9]
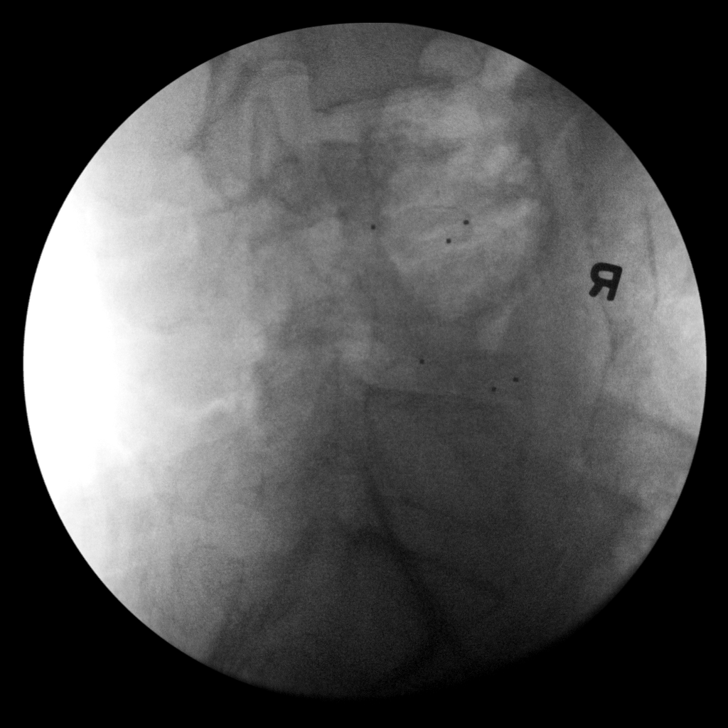
[im 9/9]
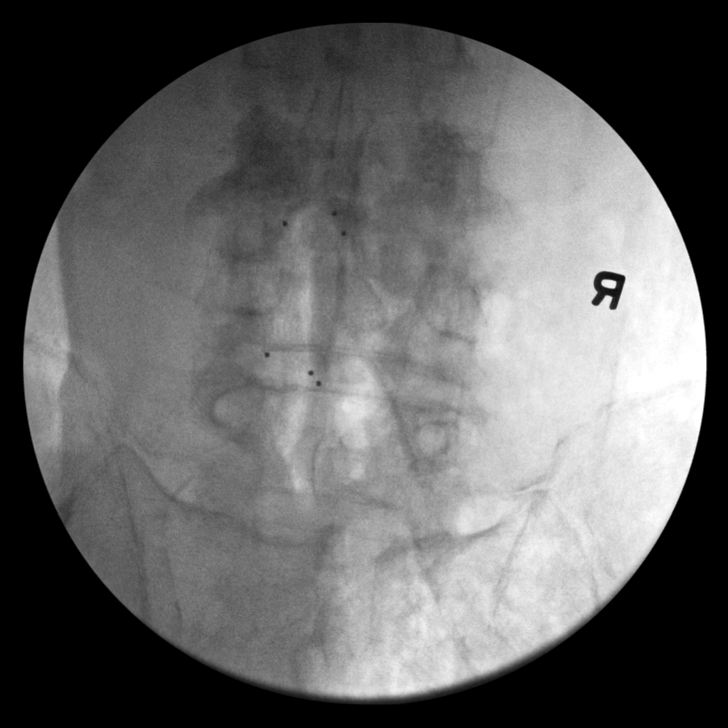

[9 of 9 positions shown; findings below may reference images not displayed]

FINDINGS: Postsurgical changes noted of the lumbar spine. Levels of interbody
fusion or noted. Number lumbar spine is difficult due to
positioning. 0 minutes 29 seconds fluoroscopy time utilized.
IMPRESSION: Postsurgical changes lumbar spine.

## 2020-07-28 DEATH — deceased
